# Patient Record
Sex: Female | Born: 1945 | Race: White | Hispanic: No | Smoking: Former smoker
Health system: Southern US, Community
[De-identification: ages and names within clinical notes are randomized; demographics above are authoritative.]

## PROBLEM LIST (undated history)

## (undated) DIAGNOSIS — T7840XA Allergy, unspecified, initial encounter: Secondary | ICD-10-CM

## (undated) DIAGNOSIS — S62109A Fracture of unspecified carpal bone, unspecified wrist, initial encounter for closed fracture: Secondary | ICD-10-CM

## (undated) DIAGNOSIS — C8590 Non-Hodgkin lymphoma, unspecified, unspecified site: Secondary | ICD-10-CM

## (undated) DIAGNOSIS — I10 Essential (primary) hypertension: Secondary | ICD-10-CM

## (undated) DIAGNOSIS — M810 Age-related osteoporosis without current pathological fracture: Secondary | ICD-10-CM

## (undated) DIAGNOSIS — E785 Hyperlipidemia, unspecified: Secondary | ICD-10-CM

## (undated) DIAGNOSIS — S82009A Unspecified fracture of unspecified patella, initial encounter for closed fracture: Secondary | ICD-10-CM

## (undated) DIAGNOSIS — K760 Fatty (change of) liver, not elsewhere classified: Secondary | ICD-10-CM

## (undated) DIAGNOSIS — I219 Acute myocardial infarction, unspecified: Secondary | ICD-10-CM

## (undated) DIAGNOSIS — H269 Unspecified cataract: Secondary | ICD-10-CM

## (undated) DIAGNOSIS — G4730 Sleep apnea, unspecified: Secondary | ICD-10-CM

## (undated) DIAGNOSIS — E049 Nontoxic goiter, unspecified: Secondary | ICD-10-CM

## (undated) HISTORY — DX: Hyperlipidemia, unspecified: E78.5

## (undated) HISTORY — DX: Unspecified fracture of unspecified patella, initial encounter for closed fracture: S82.009A

## (undated) HISTORY — DX: Fracture of unspecified carpal bone, unspecified wrist, initial encounter for closed fracture: S62.109A

## (undated) HISTORY — DX: Sleep apnea, unspecified: G47.30

## (undated) HISTORY — PX: COLONOSCOPY: SHX174

## (undated) HISTORY — DX: Nontoxic goiter, unspecified: E04.9

## (undated) HISTORY — PX: POLYPECTOMY: SHX149

## (undated) HISTORY — DX: Essential (primary) hypertension: I10

## (undated) HISTORY — DX: Non-Hodgkin lymphoma, unspecified, unspecified site: C85.90

## (undated) HISTORY — DX: Acute myocardial infarction, unspecified: I21.9

## (undated) HISTORY — DX: Fatty (change of) liver, not elsewhere classified: K76.0

## (undated) HISTORY — DX: Allergy, unspecified, initial encounter: T78.40XA

## (undated) HISTORY — PX: EYE SURGERY: SHX253

## (undated) HISTORY — DX: Age-related osteoporosis without current pathological fracture: M81.0

## (undated) HISTORY — DX: Unspecified cataract: H26.9

---

## 2002-06-01 HISTORY — PX: CARDIAC CATHETERIZATION: SHX172

## 2004-05-08 ENCOUNTER — Ambulatory Visit: Payer: Self-pay | Admitting: Cardiology

## 2004-05-15 ENCOUNTER — Ambulatory Visit: Payer: Self-pay | Admitting: Internal Medicine

## 2004-06-03 ENCOUNTER — Ambulatory Visit: Payer: Self-pay

## 2004-07-03 ENCOUNTER — Ambulatory Visit: Payer: Self-pay | Admitting: Internal Medicine

## 2004-07-08 ENCOUNTER — Ambulatory Visit: Payer: Self-pay | Admitting: Internal Medicine

## 2004-07-23 ENCOUNTER — Ambulatory Visit: Payer: Self-pay | Admitting: Internal Medicine

## 2004-09-09 ENCOUNTER — Ambulatory Visit: Payer: Self-pay | Admitting: Internal Medicine

## 2004-10-02 ENCOUNTER — Ambulatory Visit: Payer: Self-pay | Admitting: Internal Medicine

## 2004-10-29 ENCOUNTER — Ambulatory Visit: Payer: Self-pay | Admitting: Internal Medicine

## 2004-10-30 ENCOUNTER — Ambulatory Visit: Payer: Self-pay | Admitting: Internal Medicine

## 2004-11-19 ENCOUNTER — Ambulatory Visit: Payer: Self-pay

## 2004-12-08 ENCOUNTER — Ambulatory Visit: Payer: Self-pay | Admitting: Internal Medicine

## 2005-03-23 ENCOUNTER — Ambulatory Visit: Payer: Self-pay | Admitting: Family Medicine

## 2005-04-07 ENCOUNTER — Ambulatory Visit: Payer: Self-pay | Admitting: Internal Medicine

## 2005-04-30 ENCOUNTER — Ambulatory Visit: Payer: Self-pay | Admitting: Internal Medicine

## 2005-06-09 ENCOUNTER — Other Ambulatory Visit: Admission: RE | Admit: 2005-06-09 | Discharge: 2005-06-09 | Payer: Self-pay | Admitting: Family Medicine

## 2005-06-09 ENCOUNTER — Ambulatory Visit: Payer: Self-pay | Admitting: Family Medicine

## 2005-07-01 ENCOUNTER — Encounter: Admission: RE | Admit: 2005-07-01 | Discharge: 2005-07-01 | Payer: Self-pay | Admitting: Family Medicine

## 2006-09-28 ENCOUNTER — Ambulatory Visit: Payer: Self-pay | Admitting: Internal Medicine

## 2006-09-28 ENCOUNTER — Encounter: Admission: RE | Admit: 2006-09-28 | Discharge: 2006-09-28 | Payer: Self-pay | Admitting: Internal Medicine

## 2006-09-28 LAB — CONVERTED CEMR LAB
AST: 23 units/L (ref 0–37)
HDL: 62 mg/dL (ref >39.0)
Triglycerides: 59 mg/dL (ref 0–149)

## 2006-09-30 ENCOUNTER — Encounter: Admission: RE | Admit: 2006-09-30 | Discharge: 2006-09-30 | Payer: Self-pay | Admitting: Internal Medicine

## 2006-10-14 ENCOUNTER — Ambulatory Visit: Payer: Self-pay | Admitting: Internal Medicine

## 2007-02-10 ENCOUNTER — Ambulatory Visit: Payer: Self-pay | Admitting: Family Medicine

## 2007-02-10 DIAGNOSIS — E049 Nontoxic goiter, unspecified: Secondary | ICD-10-CM | POA: Insufficient documentation

## 2007-02-10 DIAGNOSIS — I10 Essential (primary) hypertension: Secondary | ICD-10-CM | POA: Insufficient documentation

## 2007-02-10 DIAGNOSIS — R42 Dizziness and giddiness: Secondary | ICD-10-CM | POA: Insufficient documentation

## 2007-02-10 DIAGNOSIS — E785 Hyperlipidemia, unspecified: Secondary | ICD-10-CM | POA: Insufficient documentation

## 2007-02-10 DIAGNOSIS — Z8719 Personal history of other diseases of the digestive system: Secondary | ICD-10-CM | POA: Insufficient documentation

## 2007-02-10 DIAGNOSIS — R609 Edema, unspecified: Secondary | ICD-10-CM | POA: Insufficient documentation

## 2007-02-10 DIAGNOSIS — I252 Old myocardial infarction: Secondary | ICD-10-CM | POA: Insufficient documentation

## 2007-02-24 ENCOUNTER — Ambulatory Visit: Payer: Self-pay | Admitting: Family Medicine

## 2007-04-04 ENCOUNTER — Ambulatory Visit: Payer: Self-pay | Admitting: Family Medicine

## 2007-04-05 LAB — CONVERTED CEMR LAB
Total CHOL/HDL Ratio: 3.2
VLDL: 26 mg/dL (ref 0–40)

## 2007-04-13 ENCOUNTER — Encounter: Admission: RE | Admit: 2007-04-13 | Discharge: 2007-04-13 | Payer: Self-pay | Admitting: Endocrinology

## 2007-04-26 ENCOUNTER — Encounter: Admission: RE | Admit: 2007-04-26 | Discharge: 2007-04-26 | Payer: Self-pay | Admitting: Endocrinology

## 2007-06-01 ENCOUNTER — Encounter: Admission: RE | Admit: 2007-06-01 | Discharge: 2007-06-01 | Payer: Self-pay | Admitting: Endocrinology

## 2007-08-03 ENCOUNTER — Ambulatory Visit: Payer: Self-pay | Admitting: Family Medicine

## 2007-08-04 LAB — CONVERTED CEMR LAB
AST: 32 units/L (ref 0–37)
Bilirubin, Direct: 0.1 mg/dL (ref 0.0–0.3)
Cholesterol: 138 mg/dL (ref 0–200)
LDL Cholesterol: 65 mg/dL (ref 0–99)
TSH: 1.09 microintl units/mL (ref 0.35–5.50)
Total Protein: 7 g/dL (ref 6.0–8.3)
VLDL: 15 mg/dL (ref 0–40)

## 2007-08-08 ENCOUNTER — Encounter: Payer: Self-pay | Admitting: Family Medicine

## 2007-09-27 ENCOUNTER — Encounter: Payer: Self-pay | Admitting: Internal Medicine

## 2008-01-04 ENCOUNTER — Ambulatory Visit: Payer: Self-pay | Admitting: Family Medicine

## 2008-01-04 LAB — CONVERTED CEMR LAB
ALT: 22 units/L (ref 0–35)
AST: 23 units/L (ref 0–37)
Albumin: 3.9 g/dL (ref 3.5–5.2)
Bilirubin, Direct: 0.1 mg/dL (ref 0.0–0.3)
Total CHOL/HDL Ratio: 2.8
Triglycerides: 74 mg/dL (ref 0–149)
VLDL: 15 mg/dL (ref 0–40)

## 2008-01-05 ENCOUNTER — Encounter (INDEPENDENT_AMBULATORY_CARE_PROVIDER_SITE_OTHER): Payer: Self-pay | Admitting: *Deleted

## 2008-06-09 ENCOUNTER — Encounter: Payer: Self-pay | Admitting: Family Medicine

## 2008-07-12 ENCOUNTER — Ambulatory Visit: Payer: Self-pay | Admitting: Internal Medicine

## 2009-02-08 ENCOUNTER — Ambulatory Visit: Payer: Self-pay | Admitting: Family Medicine

## 2009-02-08 DIAGNOSIS — H60339 Swimmer's ear, unspecified ear: Secondary | ICD-10-CM | POA: Insufficient documentation

## 2009-06-07 ENCOUNTER — Encounter (INDEPENDENT_AMBULATORY_CARE_PROVIDER_SITE_OTHER): Payer: Self-pay | Admitting: *Deleted

## 2009-07-22 ENCOUNTER — Ambulatory Visit: Payer: Self-pay | Admitting: Internal Medicine

## 2009-07-22 DIAGNOSIS — Z87891 Personal history of nicotine dependence: Secondary | ICD-10-CM | POA: Insufficient documentation

## 2009-08-01 ENCOUNTER — Ambulatory Visit: Payer: Self-pay | Admitting: Internal Medicine

## 2009-08-01 DIAGNOSIS — J019 Acute sinusitis, unspecified: Secondary | ICD-10-CM | POA: Insufficient documentation

## 2009-12-16 ENCOUNTER — Ambulatory Visit: Payer: Self-pay | Admitting: Family Medicine

## 2009-12-16 ENCOUNTER — Telehealth (INDEPENDENT_AMBULATORY_CARE_PROVIDER_SITE_OTHER): Payer: Self-pay | Admitting: *Deleted

## 2009-12-16 DIAGNOSIS — H10029 Other mucopurulent conjunctivitis, unspecified eye: Secondary | ICD-10-CM | POA: Insufficient documentation

## 2009-12-17 ENCOUNTER — Ambulatory Visit: Payer: Self-pay | Admitting: Family Medicine

## 2009-12-18 ENCOUNTER — Ambulatory Visit: Payer: Self-pay | Admitting: Cardiology

## 2009-12-19 ENCOUNTER — Telehealth: Payer: Self-pay | Admitting: Family Medicine

## 2010-06-01 HISTORY — PX: OTHER SURGICAL HISTORY: SHX169

## 2010-06-29 LAB — CONVERTED CEMR LAB
AST: 30 units/L (ref 0–37)
BUN: 14 mg/dL (ref 6–23)
BUN: 14 mg/dL (ref 6–23)
Basophils Absolute: 0 10*3/uL (ref 0.0–0.1)
Basophils Relative: 0.2 % (ref 0.0–1.0)
CO2: 30 meq/L (ref 19–32)
CO2: 31 meq/L (ref 19–32)
Calcium: 10.2 mg/dL (ref 8.4–10.5)
Calcium: 10.5 mg/dL (ref 8.4–10.5)
Chloride: 103 meq/L (ref 96–112)
Chloride: 107 meq/L (ref 96–112)
Cholesterol: 179 mg/dL (ref 0–200)
Creatinine, Ser: 0.6 mg/dL (ref 0.4–1.2)
Creatinine, Ser: 0.7 mg/dL (ref 0.4–1.2)
Eosinophils Absolute: 0 10*3/uL (ref 0.0–0.6)
Eosinophils Relative: 0.5 % (ref 0.0–5.0)
Folate: 19.7 ng/mL
GFR calc Af Amer: 132 mL/min
GFR calc non Af Amer: 109 mL/min
GFR calc non Af Amer: 90.01 mL/min (ref >60)
Glucose, Bld: 103 mg/dL — ABNORMAL HIGH (ref 70–99)
Glucose, Bld: 94 mg/dL (ref 70–99)
HCT: 39.9 % (ref 36.0–46.0)
HDL: 77.3 mg/dL (ref >39.00)
Hemoglobin: 13.8 g/dL (ref 12.0–15.0)
LDL Cholesterol: 86 mg/dL (ref 0–99)
Lymphocytes Relative: 28.6 % (ref 12.0–46.0)
MCHC: 34.7 g/dL (ref 30.0–36.0)
MCV: 89.3 fL (ref 78.0–100.0)
Monocytes Absolute: 0.5 10*3/uL (ref 0.2–0.7)
Monocytes Relative: 6.7 % (ref 3.0–11.0)
Neutro Abs: 4.6 10*3/uL (ref 1.4–7.7)
Neutrophils Relative %: 64 % (ref 43.0–77.0)
Platelets: 293 10*3/uL (ref 150–400)
Potassium: 4 meq/L (ref 3.5–5.1)
Potassium: 4.2 meq/L (ref 3.5–5.1)
RBC: 4.46 M/uL (ref 3.87–5.11)
RDW: 12.2 % (ref 11.5–14.6)
Sodium: 140 meq/L (ref 135–145)
Sodium: 143 meq/L (ref 135–145)
TSH: 0.02 microintl units/mL — ABNORMAL LOW (ref 0.35–5.50)
Total CHOL/HDL Ratio: 2
Triglycerides: 79 mg/dL (ref 0.0–149.0)
VLDL: 15.8 mg/dL (ref 0.0–40.0)
Vitamin B-12: 487 pg/mL (ref 211–911)
WBC: 7.2 10*3/uL (ref 4.5–10.5)

## 2010-07-01 NOTE — Assessment & Plan Note (Signed)
Summary: swelling in eye is worse//kn   Vital Signs:  Patient profile:   65 year old female Height:      66 inches Weight:      196 pounds Temp:     97.9 degrees F oral Pulse rate:   66 / minute BP sitting:   110 / 70  (left arm)  Vitals Entered By: Jeremy Johann CMA (December 17, 2009 10:05 AM) CC: increase swelling, drainage   History of Present Illness: Pt here c/o worsening d/c and swelling in L eye.  Pt is taking keflex and vigamox.    Current Medications (verified): 1)  Altace 10 Mg Caps (Ramipril) .... Once Daily 2)  Crestor 10 Mg  Tabs (Rosuvastatin Calcium) .Marland Kitchen.. 1 By Mouth At Bedtime 3)  Fosamax 70 Mg  Tabs (Alendronate Sodium) .Marland Kitchen.. 1 By Mouth Weelky 4)  Mvi .Marland Kitchen.. 1 By Mouth Once Daily 5)  Calcium Plus D 1200mg  .... 1 By Mouth Once Daily 6)  Baby Aspirin 81 Mg Chew (Aspirin) .... Once Daily 7)  Fish Oil .... 2 Tab Once Daily 8)  Keflex 500 Mg Caps (Cephalexin) .Marland Kitchen.. 1po Two Times A Day 9)  Vigamox 0.5 % Soln (Moxifloxacin Hcl) .Marland Kitchen.. 1 Gtt Three Times A Day  Allergies (verified): No Known Drug Allergies  Past History:  Past medical, surgical, family and social histories (including risk factors) reviewed for relevance to current acute and chronic problems.  Past Medical History: Reviewed history from 02/10/2007 and no changes required. Hyperlipidemia Myocardial infarction, hx of 2004 Hypertension goiter fatty liver  Past Surgical History: Reviewed history from 02/10/2007 and no changes required. Denies surgical history  Family History: Reviewed history from 02/10/2007 and no changes required. Family History of CAD Female 1st degree relative <50 Family History Hypertension Family History Lung cancer Family History Ovarian cancer MGM--DM  Social History: Reviewed history from 02/10/2007 and no changes required. Occupation:[employer] Divorced Former Smoker quit 2004 Drug use-no Regular exercise-no  Review of Systems      See HPI  Physical  Exam  General:  Well-developed,well-nourished,in no acute distress; alert,appropriate and cooperative throughout examination Eyes:  L eye--- increased d/c and tearing , sclera red + increased swelling  Psych:  Cognition and judgment appear intact. Alert and cooperative with normal attention span and concentration. No apparent delusions, illusions, hallucinations   Impression & Recommendations:  Problem # 1:  CONJUNCTIVITIS, BACTERIAL (ICD-372.03)  Her updated medication list for this problem includes:    Vigamox 0.5 % Soln (Moxifloxacin hcl) .Marland Kitchen... 1 gtt three times a day  Orders: Ophthalmology Referral (Ophthalmology) Rocephin  250mg  (E9528) Admin of Therapeutic Inj  intramuscular or subcutaneous (41324)  Discussed treatment, and urged patient to wash hands carefully after touching face.   Complete Medication List: 1)  Altace 10 Mg Caps (Ramipril) .... Once daily 2)  Crestor 10 Mg Tabs (Rosuvastatin calcium) .Marland Kitchen.. 1 by mouth at bedtime 3)  Fosamax 70 Mg Tabs (Alendronate sodium) .Marland Kitchen.. 1 by mouth weelky 4)  Mvi  .Marland Kitchen.. 1 by mouth once daily 5)  Calcium Plus D 1200mg   .... 1 by mouth once daily 6)  Baby Aspirin 81 Mg Chew (Aspirin) .... Once daily 7)  Fish Oil  .... 2 tab once daily 8)  Keflex 500 Mg Caps (Cephalexin) .Marland Kitchen.. 1po two times a day 9)  Vigamox 0.5 % Soln (Moxifloxacin hcl) .Marland Kitchen.. 1 gtt three times a day   Medication Administration  Injection # 1:    Medication: Rocephin  250mg   Diagnosis: CONJUNCTIVITIS, BACTERIAL (ICD-372.03)    Route: IM    Site: RUOQ gluteus    Exp Date: 12/31/2011    Lot #: [id number]    Mfr: novaplus    Comments: pt given 1 gram    Patient tolerated injection without complications    Given by: Jeremy Johann CMA (December 17, 2009 10:45 AM)  Orders Added: 1)  Ophthalmology Referral [Ophthalmology] 2)  Rocephin  250mg  [J0696] 3)  Admin of Therapeutic Inj  intramuscular or subcutaneous [96372] 4)  Est. Patient Level II [51761]

## 2010-07-01 NOTE — Assessment & Plan Note (Signed)
Summary: sinus infection//sore thorat//lch   Vital Signs:  Patient profile:   65 year old female Weight:      204.6 pounds Temp:     98.0 degrees F oral Pulse rate:   72 / minute Resp:     17 per minute BP sitting:   112 / 70  (left arm) Cuff size:   large  Vitals Entered By: Shonna Chock (August 01, 2009 4:14 PM) CC: Sore throat,fever,cough(productive at times) Comments REVIEWED MED LIST, PATIENT AGREED DOSE AND INSTRUCTION CORRECT    Primary Care Provider:  Laury Axon  CC:  Sore throat, fever, and cough(productive at times).  History of Present Illness: Onset 07/29/2009 as fever & chills ; temp to 101.4.Now ST X 24hrs.Rx: NSAIDS for frontal headache with benefit.Facial pain with purulence & cough with yellow sputum. 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 No PMH asthma; smoker approx 6/day. MI in 2004.           Allergies (verified): No Known Drug Allergies  Review of Systems General:  Complains of chills and fever; denies sweats. ENT:  Complains of nasal congestion and sinus pressure. Resp:  Denies chest pain with inspiration and shortness of breath; Occa wheeze.  Physical Exam  General:  in no acute distress; alert,appropriate and cooperative throughout examination Ears:  External ear exam shows no significant lesions or deformities.  Otoscopic examination reveals clear canals, tympanic membranes are intact bilaterally without bulging, retraction, inflammation or discharge. Hearing is grossly normal bilaterally. Nose:  External nasal examination shows no deformity or inflammation. Nasal mucosa are pink and moist without lesions or exudates. Hyponasal speech Mouth:  Oral mucosa and oropharynx without lesions or exudates.  Mild pharyngeal erythema.   Lungs:  Normal respiratory effort, chest expands symmetrically. Lungs are clear to auscultation, no crackles or wheezes but decreased RLL  BS. Heart:  regular rhythm and tachycardia.   P 100 Extremities:  No clubbing, cyanosis,  edema. Skin:  Intact without suspicious lesions or rashes Cervical Nodes:  No lymphadenopathy noted Axillary Nodes:  No palpable lymphadenopathy   Impression & Recommendations:  Problem # 1:  SINUSITIS- ACUTE-NOS (ICD-461.9)  Her updated medication list for this problem includes:    Amoxicillin-pot Clavulanate 875-125 Mg Tabs (Amoxicillin-pot clavulanate) .Marland Kitchen... 1 q 12 hrs with a meal  Problem # 2:  BRONCHITIS-ACUTE (ICD-466.0)  Her updated medication list for this problem includes:    Amoxicillin-pot Clavulanate 875-125 Mg Tabs (Amoxicillin-pot clavulanate) .Marland Kitchen... 1 q 12 hrs with a meal  Problem # 3:  TOBACCO ABUSE, HX OF (ICD-V15.82)  Orders: Tobacco use cessation intermediate 3-10 minutes (99406)  Complete Medication List: 1)  Altace 10 Mg Caps (Ramipril) .... Once daily 2)  Crestor 10 Mg Tabs (Rosuvastatin calcium) .Marland Kitchen.. 1 by mouth at bedtime 3)  Fosamax 70 Mg Tabs (Alendronate sodium) .Marland Kitchen.. 1 by mouth weelky 4)  Mvi  .Marland Kitchen.. 1 by mouth once daily 5)  Calcium Plus D 1200mg   .... 1 by mouth once daily 6)  Baby Aspirin 81 Mg Chew (Aspirin) .... Once daily 7)  Fish Oil  .... 2 tab once daily 8)  Amoxicillin-pot Clavulanate 875-125 Mg Tabs (Amoxicillin-pot clavulanate) .Marland Kitchen.. 1 q 12 hrs with a meal  Other Orders: Rapid Strep (16109)  Patient Instructions: 1)  Stop Smoking Tips: Choose a Quit date. Cut down before the Quit date. decide what you will do as a substitute when you feel the urge to smoke(gum,toothpick,exercise). 2)  Drink as much fluid as you can tolerate for  the next few days. 3)  Recommended remaining out of work for 03/4-10/2009 Prescriptions: AMOXICILLIN-POT CLAVULANATE 875-125 MG TABS (AMOXICILLIN-POT CLAVULANATE) 1 q 12 hrs with a meal  #20 x 0   Entered and Authorized by:   Marga Melnick MD   Signed by:   Marga Melnick MD on 08/01/2009   Method used:   Faxed to ...       [facility] [address] #[id number]* (retail)       [address]        [place], [place]  [address]       Ph: [phone]       Fax: [phone]   RxID:   [id number]   Laboratory Results    Other Tests  Rapid Strep: negative

## 2010-07-01 NOTE — Letter (Signed)
Summary: Appointment - Reminder 2  [facility], Main Office  [address]   [place], [place] [address]   Phone: [phone]  Fax: [phone]     June 07, 2009 MRN: [id number]   [facility] [employer] [address] [place], [place]  [address]   Dear Ms. Tina [name],  Our records indicate that it is time to schedule a follow-up appointment with Dr. Tenny Craw. It is very important that we reach you to schedule this appointment. We look forward to participating in your health care needs. Please contact us at the number listed above at your earliest convenience to schedule your appointment.  If you are unable to make an appointment at this time, give [place] a call so we can update our records.  Sincerely,   Migdalia Dk [facility] Scheduling Team

## 2010-07-01 NOTE — Progress Notes (Signed)
Summary: lab results  Phone Note Outgoing Call Call back at Work Phone [phone]   Call placed by: Jeremy Johann CMA,  December 19, 2009 12:02 PM Details for Reason: sinuses are clear---how is pt feeling?  Summary of Call: left message to call office................Marland KitchenFelecia Deloach CMA  December 19, 2009 12:02 PM   Follow-up for Phone Call        pt states that she is doing good and the swelling is resolving.................Marland KitchenFelecia Deloach CMA  December 19, 2009 1:41 PM

## 2010-07-01 NOTE — Progress Notes (Signed)
Summary: Triage: Eye Concerns  Phone Note Call from Patient Call back at Home Phone [phone]   Caller: Patient Summary of Call: Message left on Triage Vm: Patient with eye bloodshot red and painful, would like an appointment.   Shonna Chock CMA  December 16, 2009 9:57 AM   Follow-up for Phone Call        pt coming in today............Marland KitchenFelecia Deloach CMA  December 16, 2009 10:34 AM

## 2010-07-01 NOTE — Assessment & Plan Note (Signed)
Summary: pain redness in eye//fd   Vital Signs:  Patient profile:   65 year old female Height:      66 inches Weight:      198 pounds Temp:     98.4 degrees F oral Pulse rate:   75 / minute BP sitting:   188 / 76  (left arm)  Vitals Entered By: Jeremy Johann CMA (December 16, 2009 2:04 PM) CC: pain,swelling redness on left eye   History of Present Illness: Pt here c/o red eye.  It started with cheek pain Thursday and Friday it felt sore but eye turned red Saturday.   No congestion but since last night she has had clear sinus drainage.    Current Medications (verified): 1)  Altace 10 Mg Caps (Ramipril) .... Once Daily 2)  Crestor 10 Mg  Tabs (Rosuvastatin Calcium) .Marland Kitchen.. 1 By Mouth At Bedtime 3)  Fosamax 70 Mg  Tabs (Alendronate Sodium) .Marland Kitchen.. 1 By Mouth Weelky 4)  Mvi .Marland Kitchen.. 1 By Mouth Once Daily 5)  Calcium Plus D 1200mg  .... 1 By Mouth Once Daily 6)  Baby Aspirin 81 Mg Chew (Aspirin) .... Once Daily 7)  Fish Oil .... 2 Tab Once Daily 8)  Keflex 500 Mg Caps (Cephalexin) .Marland Kitchen.. 1po Two Times A Day 9)  Vigamox 0.5 % Soln (Moxifloxacin Hcl) .Marland Kitchen.. 1 Gtt Three Times A Day  Allergies (verified): No Known Drug Allergies  Past History:  Past medical, surgical, family and social histories (including risk factors) reviewed for relevance to current acute and chronic problems.  Past Medical History: Reviewed history from 02/10/2007 and no changes required. Hyperlipidemia Myocardial infarction, hx of 2004 Hypertension goiter fatty liver  Past Surgical History: Reviewed history from 02/10/2007 and no changes required. Denies surgical history  Family History: Reviewed history from 02/10/2007 and no changes required. Family History of CAD Female 1st degree relative <50 Family History Hypertension Family History Lung cancer Family History Ovarian cancer MGM--DM  Social History: Reviewed history from 02/10/2007 and no changes required. Occupation:[employer] Divorced Former  Smoker quit 2004 Drug use-no Regular exercise-no  Review of Systems      See HPI  Physical Exam  General:  Well-developed,well-nourished,in no acute distress; alert,appropriate and cooperative throughout examination Eyes:  L eye-- + injected, watery,  + drainage Ears:  External ear exam shows no significant lesions or deformities.  Otoscopic examination reveals clear canals, tympanic membranes are intact bilaterally without bulging, retraction, inflammation or discharge. Hearing is grossly normal bilaterally. Nose:  L maxillary sinus tenderness and R maxillary sinus tenderness.   Mouth:  Oral mucosa and oropharynx without lesions or exudates.  Teeth in good repair. Neck:  No deformities, masses, or tenderness noted. Lungs:  Normal respiratory effort, chest expands symmetrically. Lungs are clear to auscultation, no crackles or wheezes. Heart:  Normal rate and regular rhythm. S1 and S2 normal without gallop, murmur, click, rub or other extra sounds. Extremities:  No clubbing, cyanosis, edema, or deformity noted with normal full range of motion of all joints.     Impression & Recommendations:  Problem # 1:  SINUSITIS- ACUTE-NOS (ICD-461.9)  The following medications were removed from the medication list:    Amoxicillin-pot Clavulanate 875-125 Mg Tabs (Amoxicillin-pot clavulanate) .Marland Kitchen... 1 q 12 hrs with a meal Her updated medication list for this problem includes:    Keflex 500 Mg Caps (Cephalexin) .Marland Kitchen... 1po two times a day  Instructed on treatment. Call if symptoms persist or worsen.   Orders: Radiology Referral (Radiology)  Problem #  2:  CONJUNCTIVITIS, BACTERIAL (ICD-372.03) Assessment: Comment Only  Her updated medication list for this problem includes:    Vigamox 0.5 % Soln (Moxifloxacin hcl) .Marland Kitchen... 1 gtt three times a day  Discussed treatment, and urged patient to wash hands carefully after touching face.   Orders: Radiology Referral (Radiology)  Complete Medication  List: 1)  Altace 10 Mg Caps (Ramipril) .... Once daily 2)  Crestor 10 Mg Tabs (Rosuvastatin calcium) .Marland Kitchen.. 1 by mouth at bedtime 3)  Fosamax 70 Mg Tabs (Alendronate sodium) .Marland Kitchen.. 1 by mouth weelky 4)  Mvi  .Marland Kitchen.. 1 by mouth once daily 5)  Calcium Plus D 1200mg   .... 1 by mouth once daily 6)  Baby Aspirin 81 Mg Chew (Aspirin) .... Once daily 7)  Fish Oil  .... 2 tab once daily 8)  Keflex 500 Mg Caps (Cephalexin) .Marland Kitchen.. 1po two times a day 9)  Vigamox 0.5 % Soln (Moxifloxacin hcl) .Marland Kitchen.. 1 gtt three times a day Prescriptions: VIGAMOX 0.5 % SOLN (MOXIFLOXACIN HCL) 1 gtt three times a day  #7 days x 0   Entered and Authorized by:   Loreen Freud DO   Signed by:   Loreen Freud DO on 12/16/2009   Method used:   Electronically to        [facility] #[id number]* (retail)       [address]       [place], [place]  [address]       Ph: [phone]       Fax: [phone]   RxID:   [id number] KEFLEX 500 MG CAPS (CEPHALEXIN) 1po two times a day  #20 x 0   Entered and Authorized by:   Loreen Freud DO   Signed by:   Loreen Freud DO on 12/16/2009   Method used:   Electronically to        [facility] #[id number]* (retail)       [address]       [place], [place]  [address]       Ph: [phone]       Fax: [phone]   RxID:   [id number]

## 2010-07-01 NOTE — Assessment & Plan Note (Signed)
Summary: PER CHECK OUT/SF  Medications Added * FISH OIL 2 tab once daily      Allergies Added: NKDA  Visit Type:  Follow-up Primary Provider:  Laury Axon  CC:  no complaints.  History of Present Illness: Tina [name] is a 65 year old with a history of CAD (s/p NSTEMI in 2004 with PTCA/stent to LAD).  I last saw her in February 2010 Since seen, she denies chst pain.  Breathing is ok.  She has occasional palpitations.  No dizziness.  She continues to smoke 2 packs per week.  Current Medications (verified): 1)  Altace 10 Mg Caps (Ramipril) .... Once Daily 2)  Crestor 10 Mg  Tabs (Rosuvastatin Calcium) .Marland Kitchen.. 1 By Mouth At Bedtime 3)  Fosamax 70 Mg  Tabs (Alendronate Sodium) .Marland Kitchen.. 1 By Mouth Weelky 4)  Mvi .Marland Kitchen.. 1 By Mouth Once Daily 5)  Calcium Plus D 1200mg  .... 1 By Mouth Once Daily 6)  Baby Aspirin 81 Mg Chew (Aspirin) .... Once Daily 7)  Fish Oil .... 2 Tab Once Daily  Allergies (verified): No Known Drug Allergies  Past History:  Past Medical History: Last updated: 02/10/2007 Hyperlipidemia Myocardial infarction, hx of 2004 Hypertension goiter fatty liver  Social History: Last updated: 02/10/2007 Occupation:[employer] Divorced Former Smoker quit 2004 Drug use-no Regular exercise-no  Review of Systems       All systems reviwed.  Negatvie to the above problem except as noted.  Vital Signs:  Patient profile:   65 year old female Height:      66 inches Weight:      208 pounds BMI:     33.69 Pulse rate:   78 / minute BP sitting:   130 / 82 Cuff size:   large  Vitals Entered By: Burnett Kanaris, CNA (July 22, 2009 2:04 PM)  Physical Exam  Additional Exam:  Patient is in NAD HEENT:  Normocephalic, atraumatic. EOMI, PERRLA.  Neck: JVP is normal. No thyromegaly. No bruits.  Lungs: clear to auscultation. No rales no wheezes.  Heart: Regular rate and rhythm. Normal S1, S2. No S3.   No significant murmurs. PMI not displaced.  Abdomen:  Supple, nontender.  Normal bowel sounds. No masses. No hepatomegaly.  Extremities:   Good distal pulses throughout. No lower extremity edema.  Musculoskeletal :moving all extremities.  Neuro:   alert and oriented x3.    EKG  Procedure date:  07/22/2009  Findings:      NSR.  78 bpm.  Impression & Recommendations:  Problem # 1:  MYOCARDIAL INFARCTION, HX OF (ICD-412) No signs of active ischemia.  Continue current regimen. Her updated medication list for this problem includes:    Altace 10 Mg Caps (Ramipril) ..... Once daily    Baby Aspirin 81 Mg Chew (Aspirin) ..... Once daily  Problem # 2:  HYPERTENSION (ICD-401.9) Adequate control The following medications were removed from the medication list:    Hydrochlorothiazide 25 Mg Tabs (Hydrochlorothiazide) .Marland Kitchen... 1 by mouth once daily Her updated medication list for this problem includes:    Altace 10 Mg Caps (Ramipril) ..... Once daily    Baby Aspirin 81 Mg Chew (Aspirin) ..... Once daily  Problem # 3:  HYPERLIPIDEMIA (ICD-272.4) Check fasting panel today as well as AST and BMET Her updated medication list for this problem includes:    Crestor 10 Mg Tabs (Rosuvastatin calcium) .Marland Kitchen... 1 by mouth at bedtime  Orders: TLB-Lipid Panel (80061-LIPID) TLB-AST (SGOT) (84450-SGOT)  Problem # 4:  TOBACCO ABUSE, HX OF (ICD-V15.82) Counselled on  quitting.  Other Orders: EKG w/ Interpretation (93000) TLB-BMP (Basic Metabolic Panel-BMET) (80048-METABOL)  Patient Instructions: 1)  Your physician recommends that you return for lab work in: lab work today...we will call you with results 2)  Your physician wants you to follow-up in:12 months   You will receive a reminder letter in the mail two months in advance. If you don't receive a letter, please call our office to schedule the follow-up appointment.

## 2010-07-02 ENCOUNTER — Encounter: Payer: Self-pay | Admitting: Family Medicine

## 2010-07-22 ENCOUNTER — Telehealth (INDEPENDENT_AMBULATORY_CARE_PROVIDER_SITE_OTHER): Payer: Self-pay | Admitting: *Deleted

## 2010-07-22 ENCOUNTER — Other Ambulatory Visit: Payer: Self-pay | Admitting: Family Medicine

## 2010-07-22 DIAGNOSIS — Z1231 Encounter for screening mammogram for malignant neoplasm of breast: Secondary | ICD-10-CM

## 2010-07-29 ENCOUNTER — Ambulatory Visit
Admission: RE | Admit: 2010-07-29 | Discharge: 2010-07-29 | Disposition: A | Discharge status: Home / Self Care | Payer: Managed Care, Other (non HMO) | Source: Ambulatory Visit | Attending: Family Medicine | Admitting: Family Medicine

## 2010-07-29 DIAGNOSIS — Z1231 Encounter for screening mammogram for malignant neoplasm of breast: Secondary | ICD-10-CM

## 2010-07-29 NOTE — Progress Notes (Signed)
Summary: Mammogram due per [employer]  Phone Note Outgoing Call   Call placed by: Almeta Monas CMA Duncan Dull),  July 22, 2010 9:00 AM Call placed to: Patient Summary of Call: Rcv'd letter from [employer] advising patient is due for mammogram---I called patient and advised of letter and she stated she would get an appt scheduled. Initial call taken by: Almeta Monas CMA Duncan Dull),  July 22, 2010 9:01 AM

## 2010-07-29 NOTE — Letter (Signed)
Summary: [employer]-Patient is Overdue for Mammogram  [employer]-Patient is Overdue for Mammogram   Imported By: Maryln Gottron 07/25/2010 11:12:21  _____________________________________________________________________  External Attachment:    Type:   Image     Comment:   External Document

## 2010-08-08 ENCOUNTER — Encounter (INDEPENDENT_AMBULATORY_CARE_PROVIDER_SITE_OTHER): Payer: Self-pay | Admitting: *Deleted

## 2010-08-12 NOTE — Letter (Signed)
Summary: Primary Care Appointment Letter  [facility] [place]  [address] [place], [place] [address]   Phone: [phone]  Fax: [phone]    08/08/2010 MRN: [id number]     Kaelene NOBLE [address] [place], [place]  [address]    Dear Ms. Patrecia Pace,   Your Primary Care Physician Loreen Freud DO has indicated that:    __X_____it is time to schedule an appointment for a physical and fasting labs.    _______you missed your appointment on______ and need to call and          reschedule.    _______you need to have lab work done.    _______you need to schedule an appointment discuss lab or test results.    _______you need to call to reschedule your appointment that is                       scheduled on _________.     Please call our office as soon as possible. Our phone number is [phone]. Please press option 1. Our office is open 8a-5p, Monday through Friday.     Thank you,     Primary Care Scheduler

## 2010-08-14 ENCOUNTER — Encounter: Payer: Self-pay | Admitting: Internal Medicine

## 2010-08-28 ENCOUNTER — Encounter: Payer: Self-pay | Admitting: Internal Medicine

## 2010-08-28 ENCOUNTER — Ambulatory Visit (INDEPENDENT_AMBULATORY_CARE_PROVIDER_SITE_OTHER): Payer: Managed Care, Other (non HMO) | Admitting: Internal Medicine

## 2010-08-28 DIAGNOSIS — I2510 Atherosclerotic heart disease of native coronary artery without angina pectoris: Secondary | ICD-10-CM

## 2010-08-28 DIAGNOSIS — I119 Hypertensive heart disease without heart failure: Secondary | ICD-10-CM

## 2010-08-28 DIAGNOSIS — E7800 Pure hypercholesterolemia, unspecified: Secondary | ICD-10-CM

## 2010-08-28 DIAGNOSIS — I10 Essential (primary) hypertension: Secondary | ICD-10-CM

## 2010-08-28 DIAGNOSIS — E785 Hyperlipidemia, unspecified: Secondary | ICD-10-CM

## 2010-08-28 DIAGNOSIS — I252 Old myocardial infarction: Secondary | ICD-10-CM

## 2010-08-28 LAB — AST: AST: 33 U/L (ref 0–37)

## 2010-08-28 LAB — LIPID PANEL
Triglycerides: 72 mg/dL (ref 0.0–149.0)
VLDL: 14.4 mg/dL (ref 0.0–40.0)

## 2010-08-28 LAB — BASIC METABOLIC PANEL
CO2: 27 mEq/L (ref 19–32)
Calcium: 9.5 mg/dL (ref 8.4–10.5)
GFR: 78 mL/min (ref >60.00)
Potassium: 4.3 mEq/L (ref 3.5–5.1)
Sodium: 143 mEq/L (ref 135–145)

## 2010-08-28 LAB — CK: Total CK: 203 U/L — ABNORMAL HIGH (ref 7–177)

## 2010-08-28 NOTE — Progress Notes (Signed)
   Patient ID: Tina [name], female    DOB: [birth year], 65 y.o.   MRN: [id number]  HPI    Review of Systems    Physical Exam

## 2010-08-28 NOTE — Patient Instructions (Signed)
Your physician recommends that you schedule a follow-up appointment in: 12 months with Dr. Tenny Craw Your physician has recommended that you have a sleep study. This test records several body functions during sleep, including: brain activity, eye movement, oxygen and carbon dioxide blood levels, heart rate and rhythm, breathing rate and rhythm, the flow of air through your mouth and nose, snoring, body muscle movements, and chest and belly movement.

## 2010-08-28 NOTE — Progress Notes (Signed)
HPI  pateint is a 65 year old with a history of CAD (s/p NSTEMI in 2004 with PTCA/stent to LAD).  I last saw her in February 2011 Since seen she has done OK.  She denies chest pain.  No DOE.  She is tired a lot.  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  Does snore a lot Notes some muscle aches in R leg.  Not elsewhere Allergies no known allergies  Current Outpatient Prescriptions  Medication Sig Dispense Refill  . aspirin 81 MG tablet Take 81 mg by mouth daily.        . Calcium Carbonate-Vit D-Min (CALCIUM 1200 PO) 1 tab po qd       . Fish Oil OIL 2 tabs po qd       . Multiple Vitamin (MULTIVITAMIN) capsule Take 1 capsule by mouth daily.        . ramipril (ALTACE) 10 MG capsule Take 10 mg by mouth daily.        . rosuvastatin (CRESTOR) 10 MG tablet Take 10 mg by mouth daily.          Past Medical History  Diagnosis Date  . Hyperlipidemia   . Myocardial infarct hx of 2004  . HTN (hypertension)   . Goiter   . Fatty liver     No past surgical history on file.  Family History  Problem Relation Age of Onset  . Coronary artery disease    . Hypertension    . Lung cancer    . Ovarian cancer      History   Social History  . Marital Status: Single    Spouse Name: N/A    Number of Children: N/A  . Years of Education: N/A   Occupational History  . Not on file.   Social History Main Topics  . Smoking status: Former Games developer  . Smokeless tobacco: Not on file  . Alcohol Use: Not on file  . Drug Use: Not on file  . Sexually Active: Not on file   Other Topics Concern  . Not on file   Social History Narrative   [facility] [facility] Smoker quit 2004Drug use-noRegular exercise-no    Review of Systems:  All systems reviewed.  They are negative to the above problem except as previously stated.  Vital Signs: BP 110/62  Pulse 62  Wt 193 lb 1.9 oz (87.599 kg)  Physical Exam  HEENT:  Normocephalic, atraumatic. EOMI, PERRLA.  Neck: JVP is normal. No thyromegaly. No bruits.  Lungs: clear to auscultation. No rales no wheezes.  Heart: Regular rate and rhythm. Normal S1, S2. No S3.   No significant murmurs. PMI not displaced.  Abdomen:  Supple, nontender. Normal bowel sounds. No masses. No hepatomegaly.  Extremities:   Good distal pulses throughout. No lower extremity edema.  Musculoskeletal :moving all extremities.  Neuro:   alert and oriented x3.  CN II-XII grossly intact.  EKG:  NSR.  62 bpm.   Assessment and Plan:

## 2010-08-29 NOTE — Assessment & Plan Note (Signed)
Continue meds.  Good control. 

## 2010-08-29 NOTE — Assessment & Plan Note (Signed)
Doing well  No symptoms to sugg angina.  With snoring I would set up for sleep study.

## 2010-08-29 NOTE — Assessment & Plan Note (Signed)
Will check fasting lipids.  I have told her to hold crestor for 2 wks and see if it helps with achiness in R leg   I doubt.  Resume if no change.  Call if improves.

## 2010-09-08 ENCOUNTER — Telehealth: Payer: Self-pay | Admitting: *Deleted

## 2010-09-08 ENCOUNTER — Encounter: Payer: Self-pay | Admitting: *Deleted

## 2010-09-08 NOTE — Telephone Encounter (Signed)
Mailed copy of labs to the patient. Dr.Ross already called her with the results.

## 2010-09-08 NOTE — Telephone Encounter (Signed)
Message copied by Layne Benton on Mon Sep 08, 2010  7:41 PM ------      Message from: Genice Rouge      Created: Thu Aug 28, 2010  9:26 AM      Regarding: lab work       Pt would like call with lab results and also labs sent to her.

## 2010-09-21 ENCOUNTER — Emergency Department (HOSPITAL_BASED_OUTPATIENT_CLINIC_OR_DEPARTMENT_OTHER)
Admission: EM | Admit: 2010-09-21 | Discharge: 2010-09-21 | Disposition: A | Discharge status: Home / Self Care | Payer: Managed Care, Other (non HMO) | Attending: Emergency Medicine | Admitting: Emergency Medicine

## 2010-09-21 DIAGNOSIS — I10 Essential (primary) hypertension: Secondary | ICD-10-CM | POA: Insufficient documentation

## 2010-09-21 DIAGNOSIS — M810 Age-related osteoporosis without current pathological fracture: Secondary | ICD-10-CM | POA: Insufficient documentation

## 2010-09-21 DIAGNOSIS — I2510 Atherosclerotic heart disease of native coronary artery without angina pectoris: Secondary | ICD-10-CM | POA: Insufficient documentation

## 2010-09-21 DIAGNOSIS — H60399 Other infective otitis externa, unspecified ear: Secondary | ICD-10-CM | POA: Insufficient documentation

## 2010-09-21 DIAGNOSIS — H9209 Otalgia, unspecified ear: Secondary | ICD-10-CM | POA: Insufficient documentation

## 2010-09-21 DIAGNOSIS — Z79899 Other long term (current) drug therapy: Secondary | ICD-10-CM | POA: Insufficient documentation

## 2010-09-30 ENCOUNTER — Ambulatory Visit (INDEPENDENT_AMBULATORY_CARE_PROVIDER_SITE_OTHER): Payer: Managed Care, Other (non HMO) | Admitting: Family Medicine

## 2010-09-30 ENCOUNTER — Ambulatory Visit (HOSPITAL_BASED_OUTPATIENT_CLINIC_OR_DEPARTMENT_OTHER): Payer: Managed Care, Other (non HMO) | Attending: Internal Medicine

## 2010-09-30 ENCOUNTER — Encounter: Payer: Self-pay | Admitting: Family Medicine

## 2010-09-30 VITALS — BP 116/74 | HR 60 | Temp 98.5°F

## 2010-09-30 DIAGNOSIS — H6090 Unspecified otitis externa, unspecified ear: Secondary | ICD-10-CM

## 2010-09-30 DIAGNOSIS — H60399 Other infective otitis externa, unspecified ear: Secondary | ICD-10-CM

## 2010-09-30 DIAGNOSIS — G4733 Obstructive sleep apnea (adult) (pediatric): Secondary | ICD-10-CM | POA: Insufficient documentation

## 2010-09-30 MED ORDER — OFLOXACIN 0.3 % OT SOLN: 10.0000 [drp] | Freq: Two times a day (BID) | OTIC | Status: DC

## 2010-09-30 MED ORDER — CEFUROXIME AXETIL 500 MG PO TABS: 500.0000 mg | ORAL_TABLET | Freq: Two times a day (BID) | ORAL | Status: AC

## 2010-09-30 NOTE — Assessment & Plan Note (Signed)
ceftin for 10 days floxin drops To ENT if no better by end of week

## 2010-09-30 NOTE — Progress Notes (Signed)
  Subjective:    Patient ID: Tina [name], female    DOB: 07/14/1945, 65 y.o.   MRN: [id number]  HPI Pt here to f/u ER from 1 week ago for OE.  Pt was given drops by ER but it has not completely resolved. Pt c/o L ear Pain only.  No other symptoms.    Review of Systems As above    Objective:   Physical Exam  Constitutional: She appears well-developed and well-nourished.  HENT:  Left Ear: Hearing normal. No lacerations. There is swelling and tenderness. No foreign bodies. No mastoid tenderness. Tympanic membrane is not injected, not scarred, not perforated, not erythematous, not retracted and not bulging.  Mouth/Throat: Oropharynx is clear and moist. No oropharyngeal exudate.          Assessment & Plan:

## 2010-09-30 NOTE — Patient Instructions (Signed)
Swimmer's Ear (Otitis Externa) Otitis externa ("swimmer's ear") is a germ (bacterial) or fungal infection of the outer ear canal (from the eardrum to the outside of the ear). Swimming in dirty water may cause swimmer's ear. It also may be caused by moisture in the ear from water remaining after swimming or bathing. Often the first signs of infection may be itching in the ear canal. This may progress to ear canal swelling, redness, and pus drainage which may be signs of infection. HOME CARE INSTRUCTIONS  Apply the antibiotic drops to the ear canal as prescribed by your doctor.   This can be a very painful medical condition. A strong pain reliever may be prescribed.   Only take over-the-counter or prescription medicines for pain, discomfort, or fever as directed by your caregiver.   If your caregiver has given you a follow-up appointment, it is very important to keep that appointment. Not keeping the appointment could result in a chronic or permanent injury, pain, hearing loss and disability. If there is any problem keeping the appointment, you must call back to this facility for assistance.  PREVENTION  It is important to keep your ear dry. Use the corner of a towel to wick water out of the ear canal after swimming or bathing.   Avoid scratching in your ear. This can damage the ear canal or remove the protective wax lining the canal and make it easier for germs (bacteria) or a fungus to grow.   You may use ear drops made of rubbing alcohol and vinegar after swimming to prevent future "swimmer ear" infections. Make up a small bottle of equal parts white vinegar and alcohol. Put 3 or 4 drops into each ear after swimming.   Avoid swimming in lakes, polluted water, or poorly chlorinated pools.  SEEK MEDICAL CARE IF:  An oral temperature above 100.4 develops.   Your ear is still painful after 3 days and shows signs of getting worse (redness, swelling, pain, or pus).  MAKE SURE YOU:   Understand  these instructions.   Will watch your condition.   Will get help right away if you are not doing well or get worse.  Document Released: 05/18/2005 Document Re-Released: 04/30/2008 ExitCare Patient Information 2011 [employer]. 

## 2010-10-03 DIAGNOSIS — G4733 Obstructive sleep apnea (adult) (pediatric): Secondary | ICD-10-CM

## 2010-10-03 NOTE — Procedures (Addendum)
NAME:  Tina [name], Tina [name] NO.:  [id number]  MEDICAL RECORD NO.:  [id number]          PATIENT TYPE:  OUT  LOCATION:  SLEEP CENTER                 FACILITY:  [facility]  PHYSICIAN:  Barbaraann Share, MD,FCCPDATE OF BIRTH:  Apr 17, 1947  DATE OF STUDY:  09/30/2010                           NOCTURNAL POLYSOMNOGRAM  REFERRING PHYSICIAN:  PAULA V ROSS  INDICATION FOR STUDY:  Hypersomnia with sleep apnea.  EPWORTH SLEEPINESS SCORE:  4.  MEDICATIONS:  SLEEP ARCHITECTURE:  The patient had a total sleep time of 343 minutes with no slow wave sleep and only 83 minutes of REM.  Sleep onset latency was borderline prolonged at 31 minutes, and REM onset was normal at 98 minutes.  Sleep efficiency was mildly reduced at 81%.  RESPIRATORY DATA:  The patient was found to have 49 obstructive apneas and 45 obstructive hypopneas, giving her an apnea/hypopnea index of 17 events per hour.  The events occurred in all body positions, but were clearly more prominent during REM.  There was moderate-to-loud snoring noted throughout.  OXYGEN DATA:  There was O2 desaturation as low as 86% with the patient's obstructive events.  CARDIAC DATA:  The patient was noted to have an occasional PAC and PVC, but no clinically significant arrhythmias were noted.  MOVEMENT-PARASOMNIA:  The patient had no significant leg jerks or other abnormal behavior seen.  IMPRESSIONS-RECOMMENDATIONS: 1. Mild obstructive sleep apnea/hypopnea syndrome with an     apnea/hypopnea index of 17 events per hour and oxygen desaturation     as low as 86%.  Treatment for this degree of sleep apnea can     include a trial of weight loss alone, upper airway surgery, dental     appliance, and also continuous positive airway pressure.  The     decision to treat this degree of sleep apnea should be based upon     its impact to the patient's quality of life, and whether it may be     aggravating comorbid medical problems.  Clinical  correlation is     suggested. 2. Occasional premature atrial contractions and premature ventricular     contractions noted, but no clinically significant arrhythmia seen.     Barbaraann Share, MD,FCCP Diplomate, American Board of Sleep Medicine Electronically Signed    [facility]/MEDQ  D:  10/03/2010 07:27:35  T:  10/03/2010 07:37:55  Job:  [id number]

## 2010-10-11 ENCOUNTER — Encounter: Payer: Self-pay | Admitting: Family Medicine

## 2010-10-14 ENCOUNTER — Ambulatory Visit (INDEPENDENT_AMBULATORY_CARE_PROVIDER_SITE_OTHER): Payer: Managed Care, Other (non HMO) | Admitting: Family Medicine

## 2010-10-14 ENCOUNTER — Encounter: Payer: Self-pay | Admitting: Family Medicine

## 2010-10-14 VITALS — BP 116/70 | HR 74 | Temp 97.4°F | Ht 64.5 in | Wt 206.4 lb

## 2010-10-14 DIAGNOSIS — Z0000 Encounter for general adult medical examination without abnormal findings: Secondary | ICD-10-CM

## 2010-10-14 DIAGNOSIS — R319 Hematuria, unspecified: Secondary | ICD-10-CM

## 2010-10-14 DIAGNOSIS — I252 Old myocardial infarction: Secondary | ICD-10-CM

## 2010-10-14 DIAGNOSIS — Z23 Encounter for immunization: Secondary | ICD-10-CM

## 2010-10-14 DIAGNOSIS — M949 Disorder of cartilage, unspecified: Secondary | ICD-10-CM

## 2010-10-14 DIAGNOSIS — M899 Disorder of bone, unspecified: Secondary | ICD-10-CM

## 2010-10-14 DIAGNOSIS — M8580 Other specified disorders of bone density and structure, unspecified site: Secondary | ICD-10-CM

## 2010-10-14 DIAGNOSIS — I10 Essential (primary) hypertension: Secondary | ICD-10-CM

## 2010-10-14 DIAGNOSIS — E785 Hyperlipidemia, unspecified: Secondary | ICD-10-CM

## 2010-10-14 LAB — POCT URINALYSIS DIPSTICK
Bilirubin, UA: NEGATIVE
Glucose, UA: NEGATIVE
Ketones, UA: NEGATIVE
Spec Grav, UA: 1.02
Urobilinogen, UA: 0.2

## 2010-10-14 MED ORDER — ALENDRONATE SODIUM 70 MG PO TABS: 70.0000 mg | ORAL_TABLET | ORAL | Status: DC

## 2010-10-14 MED ORDER — TETANUS-DIPHTH-ACELL PERTUSSIS 5-2.5-18.5 LF-MCG/0.5 IM SUSP
0.5000 mL | Freq: Once | INTRAMUSCULAR | Status: AC
  Administered 2010-10-14: 0.5 mL via INTRAMUSCULAR

## 2010-10-14 MED ORDER — RAMIPRIL 10 MG PO CAPS: 10.0000 mg | ORAL_CAPSULE | Freq: Every day | ORAL | Status: DC

## 2010-10-14 MED ORDER — ZOSTER VACCINE LIVE 19400 UNT/0.65ML ~~LOC~~ SOLR
0.6500 mL | Freq: Once | SUBCUTANEOUS | Status: AC
  Administered 2010-10-14: 19400 [IU] via SUBCUTANEOUS

## 2010-10-14 NOTE — Assessment & Plan Note (Signed)
[name]                            CARDIOLOGY OFFICE NOTE   NAME:Tina [name]                        MRN:          [id number]  DATE:07/12/2008                            DOB:          Aug 04, 1945    IDENTIFICATION:  Ms. Tina [name] is a 65 year old woman when I saw her back in  May 2008.  She has a history of CAD (NSTEMI) in 2004 with PTCA/stent to  the LAD.   Since seen, she was actually admitted to [name] few days  ago, she was at work (she works the night shift as a Engineer, civil (consulting)).  She  noticed her heart rate speeding quickly increasing and she felt a  pounding in her neck.  She said it was about 1:40.  She did not feel  well after 7 minutes, but denied dizziness.  No chest pain, went to the  emergency room.  From there she was again noted to be in sinus  tachycardia.  She was admitted, ruled out for a myocardial infarction.  Echocardiogram showed normal LV function.  Of note, a D-dimer was  elevated at 625, but a chest CT was negative for pulmonary embolus.  She  was discharged home.   Since discharge, she has not had any further spells.   She denies chest pain, neck pain, arm pain.  She is active during the  day.  She is not exercising like she used to and of note, she is back to  smoking about a pack per week.   CURRENT MEDICINES:  1. Aspirin 325.  2. Multivitamin.  3. Calcium.  4. Crestor 10.  5. Fosamax 70.  6. Altace 10.   PHYSICAL EXAMINATION:  GENERAL:  On exam, the patient is in no distress  at rest.  VITAL SIGNS:  Blood pressure 134/80, pulse 70 and regular, weight 209  which is up from 190 in last visit.  NECK:  No bruits.  LUNGS:  Clear to auscultation.  CARDIAC:  Regular rate and rhythm.  S1, S2.  No S3.  No murmurs.  ABDOMEN:  Benign, obese.  EXTREMITIES:  Good distal pulses.  No edema.   A 12-lead EKG shows normal sinus rhythm 70 beats per minute.   IMPRESSION:  1. Coronary artery disease, seems to be  clinically stable.  I am not      convinced this spell she had was related to ischemic incident.  I      would continue on the same regimen.  2. Palpitations.  Again, we will follow if she has a recurrence.  We      will plan further workup.  3. Dyslipidemia.  We will need to get fasting lipids.  She said she      thought it was done at [name], if not she will have them      repeated.  4. Health care maintenance.  Encouraged her to stay active, watch her      diet, and quit smoking.  We spent about 15 minutes talking about      this.   The patient will be set  to be seen in 1 year's time, sooner if problems  develop.  I will be in touch with her regarding the lab work.  I do  think she needs to be seen by [name] just for followup continued  care.  I have refilled her prescriptions today.     Tina Riffle, MD, [name]  Electronically Signed    PVR/MedQ  DD: 07/12/2008  DT: 07/12/2008  Job #: [id number]   cc:   Tina Perla, DO

## 2010-10-14 NOTE — Assessment & Plan Note (Signed)
[facility]                            CARDIOLOGY OFFICE NOTE   NAME:Tina [name]                          MRN:          [id number]  DATE:10/14/2006                            DOB:          12/17/1945    IDENTIFICATION:  Tina [name] is a 65 year old woman, history of CAD (non-Q-  wave MI in 2004 with PTCA stent to the LAD).  I saw her back in clinic  actually in 2006.   In the interval, she has done okay.  She notes an occasional twinge in  her breasts plus-minus activity, no chest pain otherwise, no shortness  of breath.  She was walking; not so much now.   CURRENT MEDICATIONS:  1. Aspirin 325.  2. A multivitamin.  3. Calcium 1.2.  4. Crestor 10.  5. Fosamax.   PHYSICAL EXAMINATION:  GENERAL:  The patient is in no distress.  VITAL SIGNS:  Blood pressure 134/81, pulse of 76, weight 190, up 3  pounds from 2006.  LUNGS:  Clear.  CARDIAC:  Regular rate and rhythm, S1 and S2, no S3.  ABDOMEN:  Benign.  EXTREMITIES:  No edema.   IMPRESSION:  1. Coronary artery disease, clinically stable, breast pain is      atypical.  Will continue.  2. Dyslipidemia.  Will checking fasting lipids in October.   NOTE:  Her labs now:  Her LDL is 90, it had been 80.  I think she can do  better, and she says her diet has not been the best, HDL is great at 62,  total cholesterol 164.   Otherwise, I will follow up in one year's time, sooner if problems  develop.     Pricilla Riffle, MD, [facility]     PVR/MedQ  DD: 10/14/2006  DT: 10/14/2006  Job #: [id number]

## 2010-10-14 NOTE — Assessment & Plan Note (Signed)
Refill meds stable 

## 2010-10-14 NOTE — Assessment & Plan Note (Signed)
con't meds Per cardio 

## 2010-10-14 NOTE — Patient Instructions (Signed)
Diet and exercise--- 3x a week for 30 minutes

## 2010-10-14 NOTE — Assessment & Plan Note (Addendum)
On crestor Cardiology follows Reviewed labs from cardio

## 2010-10-14 NOTE — Progress Notes (Signed)
   Subjective:     Tina [name] is a 65 y.o. female and is here for a comprehensive physical exam. The patient reports no problems.  History   Social History  . Marital Status: Divorced    Spouse Name: N/A    Number of Children: N/A  . Years of Education: N/A   Occupational History  . Not on file.   Social History Main Topics  . Smoking status: Former Smoker -- 1.0 packs/day for 30 years    Quit date: 07/16/2010  . Smokeless tobacco: Former Neurosurgeon    Quit date: 06/01/2002  . Alcohol Use: Not on file  . Drug Use: Not on file  . Sexually Active: No   Other Topics Concern  . Not on file   Social History Narrative   [place]up[place] [facility]cedFormer Smoker quit 2004Drug use-noRegular exercise-no   Health Maintenance  Topic Date Due  . Tetanus/tdap  03/02/1966  . Colonoscopy  03/02/1997  . Influenza Vaccine  03/02/2011  . Mammogram  07/29/2012  . Pap Smear  10/13/2012  . Zostavax  Completed    The following portions of the patient's history were reviewed and updated as appropriate: allergies, current medications, past family history, past medical history, past social history, past surgical history and problem list.  Review of Systems Review of Systems  Constitutional: Negative for activity change, appetite change and fatigue.  HENT: Negative for hearing loss, congestion, tinnitus and ear discharge.  dentist due Eyes: Negative for visual disturbance (see optho q1y -- vision corrected to 20/20 with glasses).  Respiratory: Negative for cough, chest tightness and shortness of breath.   Cardiovascular: Negative for chest pain, palpitations and leg swelling.  Gastrointestinal: Negative for abdominal pain, diarrhea, constipation and abdominal distention.  Genitourinary: Negative for urgency, frequency, decreased urine volume and difficulty urinating.  Musculoskeletal: Negative for back pain, arthralgias and gait problem.  Skin: Negative for color change, pallor and  rash.  Neurological: Negative for dizziness, light-headedness, numbness and headaches.  Hematological: Negative for adenopathy. Does not bruise/bleed easily.  Psychiatric/Behavioral: Negative for suicidal ideas, confusion, sleep disturbance, self-injury, dysphoric mood, decreased concentration and agitation.       Objective:    BP 116/70  Pulse 74  Temp(Src) 97.4 F (36.3 C) (Oral)  Ht 5' 4.5" (1.638 m)  Wt 206 lb 6.4 oz (93.622 kg)  BMI 34.88 kg/m2  SpO2 98% General appearance: alert, cooperative, appears stated age and no distress Head: Normocephalic, without obvious abnormality, atraumatic Eyes: conjunctivae/corneas clear. PERRL, EOM's intact. Fundi benign. Ears: normal TM's and external ear canals both ears Nose: Nares normal. Septum midline. Mucosa normal. No drainage or sinus tenderness. Throat: lips, mucosa, and tongue normal; teeth and gums normal Neck: no adenopathy, no carotid bruit, no JVD, supple, symmetrical, trachea midline and thyroid not enlarged, symmetric, no tenderness/mass/nodules Lungs: clear to auscultation bilaterally Breasts: normal appearance, no masses or tenderness Heart: regular rate and rhythm, S1, S2 normal, no murmur, click, rub or gallop Abdomen: soft, non-tender; bowel sounds normal; no masses,  no organomegaly Extremities: extremities normal, atraumatic, no cyanosis or edema Pulses: 2+ and symmetric Skin: Skin color, texture, turgor normal. No rashes or lesions Lymph nodes: Cervical, supraclavicular, and axillary nodes normal. Neurologic: Grossly normal    Assessment:    Healthy female exam.    Plan:     See After Visit Summary for Counseling Recommendations

## 2010-10-15 LAB — CBC WITH DIFFERENTIAL/PLATELET
Basophils Absolute: 0 10*3/uL (ref 0.0–0.1)
Basophils Relative: 0.3 % (ref 0.0–3.0)
Eosinophils Relative: 1.4 % (ref 0.0–5.0)
Hemoglobin: 13.1 g/dL (ref 12.0–15.0)
Lymphs Abs: 2.2 10*3/uL (ref 0.7–4.0)
MCHC: 33.9 g/dL (ref 30.0–36.0)

## 2010-10-15 LAB — CK: Total CK: 94 U/L (ref 7–177)

## 2010-10-15 LAB — BASIC METABOLIC PANEL
Calcium: 9.7 mg/dL (ref 8.4–10.5)
Sodium: 141 mEq/L (ref 135–145)

## 2010-10-16 LAB — URINE CULTURE

## 2010-10-23 ENCOUNTER — Telehealth: Payer: Self-pay | Admitting: Internal Medicine

## 2010-10-23 ENCOUNTER — Ambulatory Visit
Admission: RE | Admit: 2010-10-23 | Discharge: 2010-10-23 | Disposition: A | Discharge status: Home / Self Care | Payer: Managed Care, Other (non HMO) | Source: Ambulatory Visit | Attending: Family Medicine | Admitting: Family Medicine

## 2010-10-23 DIAGNOSIS — M8580 Other specified disorders of bone density and structure, unspecified site: Secondary | ICD-10-CM

## 2010-10-23 NOTE — Telephone Encounter (Signed)
Called patient with sleep study results and mailed copy of the report to her home address.

## 2010-10-23 NOTE — Telephone Encounter (Signed)
Pt calling for sleep study results  

## 2010-10-30 ENCOUNTER — Other Ambulatory Visit (INDEPENDENT_AMBULATORY_CARE_PROVIDER_SITE_OTHER): Payer: Managed Care, Other (non HMO)

## 2010-10-30 ENCOUNTER — Other Ambulatory Visit: Payer: Self-pay | Admitting: Family Medicine

## 2010-10-30 DIAGNOSIS — R319 Hematuria, unspecified: Secondary | ICD-10-CM

## 2010-10-30 LAB — POCT URINALYSIS DIPSTICK
Bilirubin, UA: NEGATIVE
Glucose, UA: NEGATIVE
Ketones, UA: NEGATIVE
Nitrite, UA: NEGATIVE
Spec Grav, UA: 1.02

## 2010-11-01 LAB — URINE CULTURE: Organism ID, Bacteria: NO GROWTH

## 2010-11-04 NOTE — Progress Notes (Signed)
Was urine culture done?

## 2010-11-05 ENCOUNTER — Encounter: Payer: Self-pay | Admitting: *Deleted

## 2010-11-10 ENCOUNTER — Encounter: Payer: Self-pay | Admitting: Family Medicine

## 2010-12-11 ENCOUNTER — Other Ambulatory Visit: Payer: Managed Care, Other (non HMO) | Admitting: Internal Medicine

## 2011-03-27 ENCOUNTER — Encounter: Payer: Self-pay | Admitting: Family Medicine

## 2011-03-27 ENCOUNTER — Ambulatory Visit (INDEPENDENT_AMBULATORY_CARE_PROVIDER_SITE_OTHER): Payer: Managed Care, Other (non HMO) | Admitting: Family Medicine

## 2011-03-27 VITALS — BP 132/92 | HR 67 | Temp 98.2°F | Wt 201.4 lb

## 2011-03-27 DIAGNOSIS — L738 Other specified follicular disorders: Secondary | ICD-10-CM

## 2011-03-27 DIAGNOSIS — L739 Follicular disorder, unspecified: Secondary | ICD-10-CM

## 2011-03-27 MED ORDER — SULFAMETHOXAZOLE-TMP DS 800-160 MG PO TABS: 1.0000 | ORAL_TABLET | Freq: Two times a day (BID) | ORAL | Status: AC

## 2011-03-27 NOTE — Patient Instructions (Signed)
Folliculitis      Folliculitis is an infection and inflammation of the hair follicles. Hair follicles become red and irritated. This inflammation is usually caused by bacteria. The bacteria thrive in warm, moist environments. This condition can be seen anywhere on the body.   CAUSES  The most common cause of folliculitis is an infection by germs (bacteria). Fungal and viral infections can also cause the condition. Viral infections may be more common in people whose bodies are unable to fight disease well (weakened immune systems). Examples include people with:  · AIDS.   · An organ transplant.   · Cancer.   People with depressed immune systems, diabetes, or obesity, have a greater risk of getting folliculitis than the general population. Certain chemicals, especially oils and tars, also can cause folliculitis.  SYMPTOMS  · An early sign of folliculitis is a small, white or yellow pus-filled, itchy lesion (pustule). These lesions appear on a red, inflamed follicle. They are usually less than 5 mm (.20 inches).   · The most likely starting points are the scalp, thighs, legs, back and buttocks. Folliculitis is also frequently found in areas of repeated shaving.   · When an infection of the follicle goes deeper, it becomes a boil or furuncle. A group of closely packed boils create a larger lesion (a carbuncle). These sores (lesions) tend to occur in hairy, sweaty areas of the body.   TREATMENT   · A doctor who specializes in skin problems (dermatologists) treats mild cases of folliculitis with antiseptic washes.   · They also use a skin application which kills germs (topical antibiotics). Tea tree oil is a good topical antiseptic as well. It can be found at a health food store. A small percentage of individuals may develop an allergy to the tea tree oil.   · Mild to moderate boils respond well to warm water compresses applied three times daily.   · In some cases, oral antibiotics should be taken with the skin treatment.    · If lesions contain large quantities of pus or fluid, your caregiver may drain them. This allows the topical antibiotics to get to the affected areas better.   · Stubborn cases of folliculitis may respond to laser hair removal. This process uses a high intensity light beam (a laser) to destroy the follicle and reduces the scarring from folliculitis. After laser hair removal, hair will no longer grow in the laser treated area.   Patients with long-lasting folliculitis need to find out where the infection is coming from. Germs can live in the nostrils of the patient. This can trigger an outbreak now and then. Sometimes the bacteria live in the nostrils of a family member. This person does not develop the disorder but they repeatedly re-expose others to the germ. To break the cycle of recurrence in the patient, the family member must also undergo treatment.  PREVENTION   · Individuals who are predisposed to folliculitis should be extremely careful about personal hygiene.   · Application of antiseptic washes may help prevent recurrences.   · A topical antibiotic cream, mupirocin (Bactroban®), has been effective at reducing bacteria in the nostrils. It is applied inside the nose with your little finger. This is done twice daily for a week. Then it is repeated every 6 months.   · Because follicle disorders tend to come back, patients must receive follow-up care. Your caregiver may be able to recognize a recurrence before it becomes severe.   SEEK IMMEDIATE MEDICAL CARE   IF:   · You develop redness, swelling, or increasing pain in the area.   · You have a fever.   · You are not improving with treatment or are getting worse.   · You have any other questions or concerns.   Document Released: 07/27/2001 Document Revised: 01/28/2011 Document Reviewed: 05/23/2008  ExitCare® Patient Information ©2012 [employer].

## 2011-03-27 NOTE — Progress Notes (Signed)
  Subjective:    Patient ID: [name], female    DOB: Sep 23, 1945, 65 y.o.   MRN: [id number]  HPI Pt is here c/o rash under L arm and several days.  She has been using heat with little relief.   No other complaints.   Review of Systems As above    Objective:   Physical Exam  Constitutional: She is oriented to person, place, and time. She appears well-developed and well-nourished.  Neurological: She is alert and oriented to person, place, and time.  Skin:       + papules L axilla --no drainage  Psychiatric: She has a normal mood and affect. Her behavior is normal. Judgment and thought content normal.          Assessment & Plan:  Folliculitis---bactrim ds 1 po bid                      Warm compresses

## 2011-06-03 ENCOUNTER — Other Ambulatory Visit: Payer: Self-pay | Admitting: Internal Medicine

## 2011-07-17 ENCOUNTER — Other Ambulatory Visit: Payer: Self-pay | Admitting: Family Medicine

## 2011-07-17 DIAGNOSIS — Z1231 Encounter for screening mammogram for malignant neoplasm of breast: Secondary | ICD-10-CM

## 2011-08-04 ENCOUNTER — Ambulatory Visit
Admission: RE | Admit: 2011-08-04 | Discharge: 2011-08-04 | Disposition: A | Discharge status: Home / Self Care | Payer: Managed Care, Other (non HMO) | Source: Ambulatory Visit | Attending: Family Medicine | Admitting: Family Medicine

## 2011-08-04 DIAGNOSIS — Z1231 Encounter for screening mammogram for malignant neoplasm of breast: Secondary | ICD-10-CM

## 2011-08-17 ENCOUNTER — Ambulatory Visit: Payer: Managed Care, Other (non HMO) | Admitting: Internal Medicine

## 2011-08-21 ENCOUNTER — Ambulatory Visit (INDEPENDENT_AMBULATORY_CARE_PROVIDER_SITE_OTHER): Payer: Managed Care, Other (non HMO) | Admitting: Internal Medicine

## 2011-08-21 VITALS — BP 145/75 | HR 56 | Ht 65.0 in | Wt 187.0 lb

## 2011-08-21 DIAGNOSIS — I252 Old myocardial infarction: Secondary | ICD-10-CM

## 2011-08-21 DIAGNOSIS — E785 Hyperlipidemia, unspecified: Secondary | ICD-10-CM

## 2011-08-21 DIAGNOSIS — I10 Essential (primary) hypertension: Secondary | ICD-10-CM

## 2011-08-21 LAB — BASIC METABOLIC PANEL
BUN: 13 mg/dL (ref 6–23)
CO2: 29 mEq/L (ref 19–32)
Chloride: 101 mEq/L (ref 96–112)
Glucose, Bld: 99 mg/dL (ref 70–99)
Potassium: 4.3 mEq/L (ref 3.5–5.1)

## 2011-08-21 LAB — LIPID PANEL
Cholesterol: 198 mg/dL (ref 0–200)
Total CHOL/HDL Ratio: 3
Triglycerides: 89 mg/dL (ref 0.0–149.0)

## 2011-08-21 LAB — CK: Total CK: 78 U/L (ref 7–177)

## 2011-08-21 NOTE — Assessment & Plan Note (Signed)
Doing well.  No symptoms of angina.

## 2011-08-21 NOTE — Assessment & Plan Note (Signed)
Will check labs.   Told her to try CoQ 10 Will check on dose of Vit D

## 2011-08-21 NOTE — Progress Notes (Signed)
HPI pateint is a 66 year old with a history of CAD (s/p NSTEMI in 2004 with PTCA/stent to LAD). I last saw her in March 2012. Since seen had sleep study that showed mild sleep apnea. Started weight watchers in the fall  Down 25# Occasionally feels puffy in face.  Watches Na Occasional SOB.  No CP.  Working out on treadmill 3x per wk.   Still achy on meds.  Would like to come off statin if able.  No Known Allergies  Current Outpatient Prescriptions  Medication Sig Dispense Refill  . alendronate (FOSAMAX) 70 MG tablet Take 1 tablet (70 mg total) by mouth every 7 (seven) days. Take with a full glass of water on an empty stomach.  4 tablet  11  . aspirin 81 MG tablet Take 81 mg by mouth daily.        . Biotin 10 MG TABS Take by mouth. For hair griowth      . Calcium Carbonate-Vit D-Min (CALCIUM 1200 PO) 1 tab po qd       . CRESTOR 20 MG tablet TAKE 1/2 TABLET BY MOUTH EVERY NIGHT AT BEDTIME  45 tablet  5  . Fish Oil OIL 2 tabs po qd       . Multiple Vitamin (MULTIVITAMIN) capsule Take 1 capsule by mouth daily.        . ramipril (ALTACE) 10 MG capsule Take 1 capsule (10 mg total) by mouth daily.  90 capsule  3  . rosuvastatin (CRESTOR) 10 MG tablet Take 10 mg by mouth daily.          Past Medical History  Diagnosis Date  . Hyperlipidemia   . Myocardial infarct hx of 2004  . HTN (hypertension)   . Goiter   . Fatty liver     Past Surgical History  Procedure Date  . Cardiac catheterization 2004    stent    Family History  Problem Relation Age of Onset  . Coronary artery disease    . Lung cancer    . Ovarian cancer    . Diabetes Maternal Grandmother   . Hypertension Mother   . Heart disease Mother 55    MI--- died from #3 at 58  . Hypertension Father   . Cancer Father 70    ovarian  . Hypertension Sister   . Hypertension Brother   . Cancer Sister 50    brain tumor    History   Social History  . Marital Status: Divorced    Spouse Name: N/A    Number of Children: N/A   . Years of Education: N/A   Occupational History  . Not on file.   Social History Main Topics  . Smoking status: Former Smoker -- 1.0 packs/day for 30 years    Quit date: 07/16/2010  . Smokeless tobacco: Former Neurosurgeon    Quit date: 06/01/2002  . Alcohol Use: Not on file  . Drug Use: Not on file  . Sexually Active: No   Other Topics Concern  . Not on file   Social History Narrative   [place]up[place] [facility]orcedFormer Smoker quit 2004Drug use-noRegular exercise-no    Review of Systems:  All systems reviewed.  They are negative to the above problem except as previously stated.  Vital Signs: BP 145/75  Pulse 56  Ht 5\' 5"  (1.651 m)  Wt 187 lb (84.823 kg)  BMI 31.12 kg/m2  Physical Exam Patinet is in NAD.  Did not take meds today. HEENT:  Normocephalic, atraumatic.  EOMI, PERRLA.  Neck: JVP is normal. No thyromegaly. No bruits.  Lungs: clear to auscultation. No rales no wheezes.  Heart: Regular rate and rhythm. Normal S1, S2. No S3.   No significant murmurs. PMI not displaced.  Abdomen:  Supple, nontender. Normal bowel sounds. No masses. No hepatomegaly.  Extremities:   Good distal pulses throughout. No lower extremity edema.  Musculoskeletal :moving all extremities.  Neuro:   alert and oriented x3.  CN II-XII grossly intact.  SB  56  bpm.  Assessment and Plan:

## 2011-08-21 NOTE — Assessment & Plan Note (Signed)
BP is fair.  Did not take meds this AM.  Will check BMET  No change

## 2011-08-21 NOTE — Patient Instructions (Signed)
Lab work today We will call you with results.  Your physician wants you to follow-up in: 12 months You will receive a reminder letter in the mail two months in advance. If you don't receive a letter, please call our office to schedule the follow-up appointment.  

## 2011-08-27 ENCOUNTER — Telehealth: Payer: Self-pay | Admitting: *Deleted

## 2011-08-27 NOTE — Telephone Encounter (Signed)
Called patient with lab results. Advised her per Dr.Ross to hold Crestor for 2 to 3 weeks to see if her legs feel any better. If she feels the same she will return back to taking Crestor. If she feels better, then she will call us back and Dr.Ross will review and decide what statin she should be taking.

## 2011-11-20 ENCOUNTER — Other Ambulatory Visit: Payer: Self-pay | Admitting: Family Medicine

## 2011-12-11 ENCOUNTER — Encounter: Payer: Self-pay | Admitting: Family Medicine

## 2011-12-11 ENCOUNTER — Ambulatory Visit (INDEPENDENT_AMBULATORY_CARE_PROVIDER_SITE_OTHER): Payer: Managed Care, Other (non HMO) | Admitting: Family Medicine

## 2011-12-11 VITALS — BP 132/76 | HR 59 | Temp 98.1°F | Ht 65.0 in | Wt 183.6 lb

## 2011-12-11 DIAGNOSIS — I252 Old myocardial infarction: Secondary | ICD-10-CM

## 2011-12-11 DIAGNOSIS — E785 Hyperlipidemia, unspecified: Secondary | ICD-10-CM

## 2011-12-11 DIAGNOSIS — I10 Essential (primary) hypertension: Secondary | ICD-10-CM

## 2011-12-11 DIAGNOSIS — Z0000 Encounter for general adult medical examination without abnormal findings: Secondary | ICD-10-CM

## 2011-12-11 LAB — BASIC METABOLIC PANEL
BUN: 20 mg/dL (ref 6–23)
CO2: 30 mEq/L (ref 19–32)
Calcium: 9.2 mg/dL (ref 8.4–10.5)
Glucose, Bld: 105 mg/dL — ABNORMAL HIGH (ref 70–99)
Sodium: 142 mEq/L (ref 135–145)

## 2011-12-11 LAB — CBC WITH DIFFERENTIAL/PLATELET
Basophils Absolute: 0 10*3/uL (ref 0.0–0.1)
Eosinophils Absolute: 0.1 10*3/uL (ref 0.0–0.7)
Hemoglobin: 13.1 g/dL (ref 12.0–15.0)
Lymphocytes Relative: 34.5 % (ref 12.0–46.0)
Lymphs Abs: 2.3 10*3/uL (ref 0.7–4.0)
MCHC: 33.1 g/dL (ref 30.0–36.0)
Neutro Abs: 3.7 10*3/uL (ref 1.4–7.7)
RDW: 13.3 % (ref 11.5–14.6)

## 2011-12-11 LAB — HEPATIC FUNCTION PANEL
Albumin: 3.8 g/dL (ref 3.5–5.2)
Alkaline Phosphatase: 52 U/L (ref 39–117)

## 2011-12-11 LAB — POCT URINALYSIS DIPSTICK
Blood, UA: NEGATIVE
Protein, UA: NEGATIVE
Spec Grav, UA: >=1.03
Urobilinogen, UA: 0.2

## 2011-12-11 LAB — LIPID PANEL
Cholesterol: 219 mg/dL — ABNORMAL HIGH (ref 0–200)
HDL: 71.3 mg/dL (ref >39.00)
VLDL: 20.8 mg/dL (ref 0.0–40.0)

## 2011-12-11 MED ORDER — RAMIPRIL 10 MG PO CAPS: 10.0000 mg | ORAL_CAPSULE | Freq: Every day | ORAL | Status: DC

## 2011-12-11 NOTE — Progress Notes (Signed)
Subjective:     Tina [name] is a 66 y.o. female and is here for a comprehensive physical exam. The patient reports no problems.  History   Social History  . Marital Status: Divorced    Spouse Name: N/A    Number of Children: N/A  . Years of Education: N/A   Occupational History  . nurse Other    [employer]    Social History Main Topics  . Smoking status: Former Smoker -- 1.0 packs/day for 30 years    Quit date: 07/17/2007  . Smokeless tobacco: Former Neurosurgeon    Quit date: 06/01/2002  . Alcohol Use: 2.4 oz/week    4 Glasses of wine per week  . Drug Use: No  . Sexually Active: Not Currently -- Female partner(s)   Other Topics Concern  . Not on file   Social History Narrative   Occupation:[place] [facility]DivorcedFormer Smoker quit 2004Drug use-noRegular exercise-treadmill 3x a week   Health Maintenance  Topic Date Due  . Colonoscopy  03/02/1997  . Influenza Vaccine  03/01/2012  . Pap Smear  10/13/2012  . Mammogram  08/03/2013  . Tetanus/tdap  10/13/2020  . Zostavax  Completed    The following portions of the patient's history were reviewed and updated as appropriate: allergies, current medications, past family history, past medical history, past social history, past surgical history and problem list.  Review of Systems Review of Systems  Constitutional: Negative for activity change, appetite change and fatigue.  HENT: Negative for hearing loss, congestion, tinnitus and ear discharge.  dentist -due Eyes: Negative for visual disturbance (see optho q1y -- vision corrected to 20/20 with glasses).  Respiratory: Negative for cough, chest tightness and shortness of breath.   Cardiovascular: Negative for chest pain, palpitations and leg swelling.  Gastrointestinal: Negative for abdominal pain, diarrhea, constipation and abdominal distention.  Genitourinary: Negative for urgency, frequency, decreased urine volume and difficulty urinating.  Musculoskeletal: Negative for back  pain, arthralgias and gait problem.  Skin: Negative for color change, pallor and rash.  Neurological: Negative for dizziness, light-headedness, numbness and headaches.  Hematological: Negative for adenopathy. Does not bruise/bleed easily.  Psychiatric/Behavioral: Negative for suicidal ideas, confusion, sleep disturbance, self-injury, dysphoric mood, decreased concentration and agitation.       Objective:    BP 132/76  Pulse 59  Temp 98.1 F (36.7 C) (Oral)  Ht 5\' 5"  (1.651 m)  Wt 183 lb 9.6 oz (83.28 kg)  BMI 30.55 kg/m2  SpO2 96% General appearance: alert, cooperative, appears stated age and no distress Head: Normocephalic, without obvious abnormality, atraumatic Eyes: conjunctivae/corneas clear. PERRL, EOM's intact. Fundi benign. Ears: normal TM's and external ear canals both ears Nose: Nares normal. Septum midline. Mucosa normal. No drainage or sinus tenderness. Throat: lips, mucosa, and tongue normal; teeth and gums normal Neck: no adenopathy, no carotid bruit, no JVD, supple, symmetrical, trachea midline and thyroid not enlarged, symmetric, no tenderness/mass/nodules Back: symmetric, no curvature. ROM normal. No CVA tenderness. Lungs: clear to auscultation bilaterally Breasts: normal appearance, no masses or tenderness Heart: regular rate and rhythm, S1, S2 normal, no murmur, click, rub or gallop Abdomen: soft, non-tender; bowel sounds normal; no masses,  no organomegaly Pelvic: deferred--at pt request Extremities: extremities normal, atraumatic, no cyanosis or edema Pulses: 2+ and symmetric Skin: Skin color, texture, turgor normal. No rashes or lesions Lymph nodes: Cervical, supraclavicular, and axillary nodes normal. Neurologic: Alert and oriented X 3, normal strength and tone. Normal symmetric reflexes. Normal coordination and gait psych--no depression or anxiety  Assessment:    Healthy female exam.     Plan:    ghm utd--pt refusing colonoscopy, pap and  shingles Check labs See After Visit Summary for Counseling Recommendations

## 2011-12-11 NOTE — Assessment & Plan Note (Signed)
Stable con't meds 

## 2011-12-11 NOTE — Assessment & Plan Note (Signed)
Per cardiology 

## 2011-12-11 NOTE — Assessment & Plan Note (Signed)
Pt off crestor secondary to myalgias--- check labs  If elevated will try something differerent--also rec she take CoQ10

## 2011-12-11 NOTE — Patient Instructions (Addendum)
Preventive Care for Adults, Female A healthy lifestyle and preventive care can promote health and wellness. Preventive health guidelines for women include the following key practices.  A routine yearly physical is a good way to check with your caregiver about your health and preventive screening. It is a chance to share any concerns and updates on your health, and to receive a thorough exam.   Visit your dentist for a routine exam and preventive care every 6 months. Brush your teeth twice a day and floss once a day. Good oral hygiene prevents tooth decay and gum disease.   The frequency of eye exams is based on your age, health, family medical history, use of contact lenses, and other factors. Follow your caregiver's recommendations for frequency of eye exams.   Eat a healthy diet. Foods like vegetables, fruits, whole grains, low-fat dairy products, and lean protein foods contain the nutrients you need without too many calories. Decrease your intake of foods high in solid fats, added sugars, and salt. Eat the right amount of calories for you.Get information about a proper diet from your caregiver, if necessary.   Regular physical exercise is one of the most important things you can do for your health. Most adults should get at least 150 minutes of moderate-intensity exercise (any activity that increases your heart rate and causes you to sweat) each week. In addition, most adults need muscle-strengthening exercises on 2 or more days a week.   Maintain a healthy weight. The body mass index (BMI) is a screening tool to identify possible weight problems. It provides an estimate of body fat based on height and weight. Your caregiver can help determine your BMI, and can help you achieve or maintain a healthy weight.For adults 20 years and older:   A BMI below 18.5 is considered underweight.   A BMI of 18.5 to 24.9 is normal.   A BMI of 25 to 29.9 is considered overweight.   A BMI of 30 and above is  considered obese.   Maintain normal blood lipids and cholesterol levels by exercising and minimizing your intake of saturated fat. Eat a balanced diet with plenty of fruit and vegetables. Blood tests for lipids and cholesterol should begin at age 20 and be repeated every 5 years. If your lipid or cholesterol levels are high, you are over 50, or you are at high risk for heart disease, you may need your cholesterol levels checked more frequently.Ongoing high lipid and cholesterol levels should be treated with medicines if diet and exercise are not effective.   If you smoke, find out from your caregiver how to quit. If you do not use tobacco, do not start.   If you are pregnant, do not drink alcohol. If you are breastfeeding, be very cautious about drinking alcohol. If you are not pregnant and choose to drink alcohol, do not exceed 1 drink per day. One drink is considered to be 12 ounces (355 mL) of beer, 5 ounces (148 mL) of wine, or 1.5 ounces (44 mL) of liquor.   Avoid use of street drugs. Do not share needles with anyone. Ask for help if you need support or instructions about stopping the use of drugs.   High blood pressure causes heart disease and increases the risk of stroke. Your blood pressure should be checked at least every 1 to 2 years. Ongoing high blood pressure should be treated with medicines if weight loss and exercise are not effective.   If you are 55 to 65   years old, ask your caregiver if you should take aspirin to prevent strokes.   Diabetes screening involves taking a blood sample to check your fasting blood sugar level. This should be done once every 3 years, after age 45, if you are within normal weight and without risk factors for diabetes. Testing should be considered at a younger age or be carried out more frequently if you are overweight and have at least 1 risk factor for diabetes.   Breast cancer screening is essential preventive care for women. You should practice "breast  self-awareness." This means understanding the normal appearance and feel of your breasts and may include breast self-examination. Any changes detected, no matter how small, should be reported to a caregiver. Women in their 20s and 30s should have a clinical breast exam (CBE) by a caregiver as part of a regular health exam every 1 to 3 years. After age 40, women should have a CBE every year. Starting at age 40, women should consider having a mammography (breast X-ray test) every year. Women who have a family history of breast cancer should talk to their caregiver about genetic screening. Women at a high risk of breast cancer should talk to their caregivers about having magnetic resonance imaging (MRI) and a mammography every year.   The Pap test is a screening test for cervical cancer. A Pap test can show cell changes on the cervix that might become cervical cancer if left untreated. A Pap test is a procedure in which cells are obtained and examined from the lower end of the uterus (cervix).   Women should have a Pap test starting at age 21.   Between ages 21 and 29, Pap tests should be repeated every 2 years.   Beginning at age 30, you should have a Pap test every 3 years as long as the past 3 Pap tests have been normal.   Some women have medical problems that increase the chance of getting cervical cancer. Talk to your caregiver about these problems. It is especially important to talk to your caregiver if a new problem develops soon after your last Pap test. In these cases, your caregiver may recommend more frequent screening and Pap tests.   The above recommendations are the same for women who have or have not gotten the vaccine for human papillomavirus (HPV).   If you had a hysterectomy for a problem that was not cancer or a condition that could lead to cancer, then you no longer need Pap tests. Even if you no longer need a Pap test, a regular exam is a good idea to make sure no other problems are  starting.   If you are between ages 65 and 70, and you have had normal Pap tests going back 10 years, you no longer need Pap tests. Even if you no longer need a Pap test, a regular exam is a good idea to make sure no other problems are starting.   If you have had past treatment for cervical cancer or a condition that could lead to cancer, you need Pap tests and screening for cancer for at least 20 years after your treatment.   If Pap tests have been discontinued, risk factors (such as a new sexual partner) need to be reassessed to determine if screening should be resumed.   The HPV test is an additional test that may be used for cervical cancer screening. The HPV test looks for the virus that can cause the cell changes on the cervix.   The cells collected during the Pap test can be tested for HPV. The HPV test could be used to screen women aged 30 years and older, and should be used in women of any age who have unclear Pap test results. After the age of 30, women should have HPV testing at the same frequency as a Pap test.   Colorectal cancer can be detected and often prevented. Most routine colorectal cancer screening begins at the age of 50 and continues through age 75. However, your caregiver may recommend screening at an earlier age if you have risk factors for colon cancer. On a yearly basis, your caregiver may provide home test kits to check for hidden blood in the stool. Use of a small camera at the end of a tube, to directly examine the colon (sigmoidoscopy or colonoscopy), can detect the earliest forms of colorectal cancer. Talk to your caregiver about this at age 50, when routine screening begins. Direct examination of the colon should be repeated every 5 to 10 years through age 75, unless early forms of pre-cancerous polyps or small growths are found.   Hepatitis C blood testing is recommended for all people born from 1945 through 1965 and any individual with known risks for hepatitis C.    Practice safe sex. Use condoms and avoid high-risk sexual practices to reduce the spread of sexually transmitted infections (STIs). STIs include gonorrhea, chlamydia, syphilis, trichomonas, herpes, HPV, and human immunodeficiency virus (HIV). Herpes, HIV, and HPV are viral illnesses that have no cure. They can result in disability, cancer, and death. Sexually active women aged 25 and younger should be checked for chlamydia. Older women with new or multiple partners should also be tested for chlamydia. Testing for other STIs is recommended if you are sexually active and at increased risk.   Osteoporosis is a disease in which the bones lose minerals and strength with aging. This can result in serious bone fractures. The risk of osteoporosis can be identified using a bone density scan. Women ages 65 and over and women at risk for fractures or osteoporosis should discuss screening with their caregivers. Ask your caregiver whether you should take a calcium supplement or vitamin D to reduce the rate of osteoporosis.   Menopause can be associated with physical symptoms and risks. Hormone replacement therapy is available to decrease symptoms and risks. You should talk to your caregiver about whether hormone replacement therapy is right for you.   Use sunscreen with sun protection factor (SPF) of 30 or more. Apply sunscreen liberally and repeatedly throughout the day. You should seek shade when your shadow is shorter than you. Protect yourself by wearing long sleeves, pants, a wide-brimmed hat, and sunglasses year round, whenever you are outdoors.   Once a month, do a whole body skin exam, using a mirror to look at the skin on your back. Notify your caregiver of new moles, moles that have irregular borders, moles that are larger than a pencil eraser, or moles that have changed in shape or color.   Stay current with required immunizations.   Influenza. You need a dose every fall (or winter). The composition of  the flu vaccine changes each year, so being vaccinated once is not enough.   Pneumococcal polysaccharide. You need 1 to 2 doses if you smoke cigarettes or if you have certain chronic medical conditions. You need 1 dose at age 65 (or older) if you have never been vaccinated.   Tetanus, diphtheria, pertussis (Tdap, Td). Get 1 dose of   Tdap vaccine if you are younger than age 65, are over 65 and have contact with an infant, are a healthcare worker, are pregnant, or simply want to be protected from whooping cough. After that, you need a Td booster dose every 10 years. Consult your caregiver if you have not had at least 3 tetanus and diphtheria-containing shots sometime in your life or have a deep or dirty wound.   HPV. You need this vaccine if you are a woman age 26 or younger. The vaccine is given in 3 doses over 6 months.   Measles, mumps, rubella (MMR). You need at least 1 dose of MMR if you were born in 1957 or later. You may also need a second dose.   Meningococcal. If you are age 19 to 21 and a first-year college student living in a residence hall, or have one of several medical conditions, you need to get vaccinated against meningococcal disease. You may also need additional booster doses.   Zoster (shingles). If you are age 60 or older, you should get this vaccine.   Varicella (chickenpox). If you have never had chickenpox or you were vaccinated but received only 1 dose, talk to your caregiver to find out if you need this vaccine.   Hepatitis A. You need this vaccine if you have a specific risk factor for hepatitis A virus infection or you simply wish to be protected from this disease. The vaccine is usually given as 2 doses, 6 to 18 months apart.   Hepatitis B. You need this vaccine if you have a specific risk factor for hepatitis B virus infection or you simply wish to be protected from this disease. The vaccine is given in 3 doses, usually over 6 months.  Preventive Services /  Frequency Ages 19 to 39  Blood pressure check.** / Every 1 to 2 years.   Lipid and cholesterol check.** / Every 5 years beginning at age 20.   Clinical breast exam.** / Every 3 years for women in their 20s and 30s.   Pap test.** / Every 2 years from ages 21 through 29. Every 3 years starting at age 30 through age 65 or 70 with a history of 3 consecutive normal Pap tests.   HPV screening.** / Every 3 years from ages 30 through ages 65 to 70 with a history of 3 consecutive normal Pap tests.   Hepatitis C blood test.** / For any individual with known risks for hepatitis C.   Skin self-exam. / Monthly.   Influenza immunization.** / Every year.   Pneumococcal polysaccharide immunization.** / 1 to 2 doses if you smoke cigarettes or if you have certain chronic medical conditions.   Tetanus, diphtheria, pertussis (Tdap, Td) immunization. / A one-time dose of Tdap vaccine. After that, you need a Td booster dose every 10 years.   HPV immunization. / 3 doses over 6 months, if you are 26 and younger.   Measles, mumps, rubella (MMR) immunization. / You need at least 1 dose of MMR if you were born in 1957 or later. You may also need a second dose.   Meningococcal immunization. / 1 dose if you are age 19 to 21 and a first-year college student living in a residence hall, or have one of several medical conditions, you need to get vaccinated against meningococcal disease. You may also need additional booster doses.   Varicella immunization.** / Consult your caregiver.   Hepatitis A immunization.** / Consult your caregiver. 2 doses, 6 to 18 months   apart.   Hepatitis B immunization.** / Consult your caregiver. 3 doses usually over 6 months.  Ages 40 to 64  Blood pressure check.** / Every 1 to 2 years.   Lipid and cholesterol check.** / Every 5 years beginning at age 20.   Clinical breast exam.** / Every year after age 40.   Mammogram.** / Every year beginning at age 40 and continuing for as  long as you are in good health. Consult with your caregiver.   Pap test.** / Every 3 years starting at age 30 through age 65 or 70 with a history of 3 consecutive normal Pap tests.   HPV screening.** / Every 3 years from ages 30 through ages 65 to 70 with a history of 3 consecutive normal Pap tests.   Fecal occult blood test (FOBT) of stool. / Every year beginning at age 50 and continuing until age 75. You may not need to do this test if you get a colonoscopy every 10 years.   Flexible sigmoidoscopy or colonoscopy.** / Every 5 years for a flexible sigmoidoscopy or every 10 years for a colonoscopy beginning at age 50 and continuing until age 75.   Hepatitis C blood test.** / For all people born from 1945 through 1965 and any individual with known risks for hepatitis C.   Skin self-exam. / Monthly.   Influenza immunization.** / Every year.   Pneumococcal polysaccharide immunization.** / 1 to 2 doses if you smoke cigarettes or if you have certain chronic medical conditions.   Tetanus, diphtheria, pertussis (Tdap, Td) immunization.** / A one-time dose of Tdap vaccine. After that, you need a Td booster dose every 10 years.   Measles, mumps, rubella (MMR) immunization. / You need at least 1 dose of MMR if you were born in 1957 or later. You may also need a second dose.   Varicella immunization.** / Consult your caregiver.   Meningococcal immunization.** / Consult your caregiver.   Hepatitis A immunization.** / Consult your caregiver. 2 doses, 6 to 18 months apart.   Hepatitis B immunization.** / Consult your caregiver. 3 doses, usually over 6 months.  Ages 65 and over  Blood pressure check.** / Every 1 to 2 years.   Lipid and cholesterol check.** / Every 5 years beginning at age 20.   Clinical breast exam.** / Every year after age 40.   Mammogram.** / Every year beginning at age 40 and continuing for as long as you are in good health. Consult with your caregiver.   Pap test.** /  Every 3 years starting at age 30 through age 65 or 70 with a 3 consecutive normal Pap tests. Testing can be stopped between 65 and 70 with 3 consecutive normal Pap tests and no abnormal Pap or HPV tests in the past 10 years.   HPV screening.** / Every 3 years from ages 30 through ages 65 or 70 with a history of 3 consecutive normal Pap tests. Testing can be stopped between 65 and 70 with 3 consecutive normal Pap tests and no abnormal Pap or HPV tests in the past 10 years.   Fecal occult blood test (FOBT) of stool. / Every year beginning at age 50 and continuing until age 75. You may not need to do this test if you get a colonoscopy every 10 years.   Flexible sigmoidoscopy or colonoscopy.** / Every 5 years for a flexible sigmoidoscopy or every 10 years for a colonoscopy beginning at age 50 and continuing until age 75.   Hepatitis   C blood test.** / For all people born from 1945 through 1965 and any individual with known risks for hepatitis C.   Osteoporosis screening.** / A one-time screening for women ages 65 and over and women at risk for fractures or osteoporosis.   Skin self-exam. / Monthly.   Influenza immunization.** / Every year.   Pneumococcal polysaccharide immunization.** / 1 dose at age 65 (or older) if you have never been vaccinated.   Tetanus, diphtheria, pertussis (Tdap, Td) immunization. / A one-time dose of Tdap vaccine if you are over 65 and have contact with an infant, are a healthcare worker, or simply want to be protected from whooping cough. After that, you need a Td booster dose every 10 years.   Varicella immunization.** / Consult your caregiver.   Meningococcal immunization.** / Consult your caregiver.   Hepatitis A immunization.** / Consult your caregiver. 2 doses, 6 to 18 months apart.   Hepatitis B immunization.** / Check with your caregiver. 3 doses, usually over 6 months.  ** Family history and personal history of risk and conditions may change your caregiver's  recommendations. Document Released: 07/14/2001 Document Revised: 05/07/2011 Document Reviewed: 10/13/2010 [employer] Patient Information 2012 [employer]. 

## 2012-07-06 ENCOUNTER — Ambulatory Visit: Payer: Managed Care, Other (non HMO) | Admitting: Family Medicine

## 2012-07-16 ENCOUNTER — Other Ambulatory Visit: Payer: Self-pay

## 2012-07-25 ENCOUNTER — Telehealth: Payer: Self-pay | Admitting: Family Medicine

## 2012-07-25 NOTE — Telephone Encounter (Signed)
Patient Information:  Caller Name: Gracelyn  Phone: [phone]  Patient: Tina [name]  Gender: Female  DOB: 27-Aug-1945  Age: 67 Years  PCP: Lelon Perla.   Does the office need to follow up with this patient?: No   RN Note:  prefers to be seen 07/26/12   Reason For Call & Symptoms: fall, onto left knee, denies pain, but swollen and limited range of motion,  Reviewed Health History In EMR: Yes  Reviewed Medications In EMR: Yes  Reviewed Allergies In EMR: Yes  Reviewed Surgeries / Procedures: Yes  Date of Onset of Symptoms: 07/25/2012  Treatments Tried: resting, elevation  Treatments Tried Worked: No  Guideline(s) Used:  Knee Injury  Disposition Per Guideline:  See Today or Tomorrow in Office  Reason For Disposition Reached: Age > 60  Advice Given:  Reassurance - Direct Blow (Contusion, Bruise)  Symptoms are mild pain, swelling, and/or bruising.  Here is some care advice that should help.  Reassurance - Bending or Twisting Injury (Strain, Sprain):  The main symptom is pain that is worse with movement and walking. Swelling can occur. Rarely there may be slight bruising.  Here is some care advice that should help.  Apply a Cold Pack:  Apply a cold pack or an ice bag (wrapped in a moist towel) to the area for 20 minutes. Repeat in 1 hour, then every 4 hours while awake.  Continue this for the first 48 hours after an injury.  This will help decrease pain and swelling.  Elevate the Leg:  Lay down and put your leg on a pillow. This puts (elevates) the knee above the heart.  Do this for 15-20 minutes, 2-3 times a day, for the first two days.  This can also help decrease swelling, bruising, and pain.  Rest vs. Movement:  Movement is generally more healing in the long term than rest.  Continue normal activities (like walking) as much as your pain permits. Complete rest should only be used for the first day or two after an injury. If it really hurts too much to walk, you will need to see the  doctor.  Expected Course:  Pain, swelling, and bruising usually start to get better 2 to 3 days after an injury.  It may take 2 weeks for pain and tenderness of the injured area to go away.  Call Back If:  You become worse.  Appointment Scheduled:  07/26/2012 11:30:00 Appointment Scheduled Provider:  Lelon Perla.

## 2012-07-26 ENCOUNTER — Encounter: Payer: Self-pay | Admitting: Family Medicine

## 2012-07-26 ENCOUNTER — Ambulatory Visit (HOSPITAL_BASED_OUTPATIENT_CLINIC_OR_DEPARTMENT_OTHER)
Admission: RE | Admit: 2012-07-26 | Discharge: 2012-07-26 | Disposition: A | Discharge status: Home / Self Care | Payer: Medicare Other | Source: Ambulatory Visit | Attending: Family Medicine | Admitting: Family Medicine

## 2012-07-26 ENCOUNTER — Ambulatory Visit (INDEPENDENT_AMBULATORY_CARE_PROVIDER_SITE_OTHER): Payer: Medicare Other | Admitting: Family Medicine

## 2012-07-26 VITALS — BP 142/88 | HR 69 | Temp 98.4°F | Resp 14

## 2012-07-26 DIAGNOSIS — M25569 Pain in unspecified knee: Secondary | ICD-10-CM

## 2012-07-26 DIAGNOSIS — M25562 Pain in left knee: Secondary | ICD-10-CM

## 2012-07-26 DIAGNOSIS — R937 Abnormal findings on diagnostic imaging of other parts of musculoskeletal system: Secondary | ICD-10-CM | POA: Insufficient documentation

## 2012-07-26 NOTE — Patient Instructions (Signed)
Knee Pain The knee is the complex joint between your thigh and your lower leg. It is made up of bones, tendons, ligaments, and cartilage. The bones that make up the knee are:  The femur in the thigh.  The tibia and fibula in the lower leg.  The patella or kneecap riding in the groove on the lower femur. CAUSES  Knee pain is a common complaint with many causes. A few of these causes are:  Injury, such as:  A ruptured ligament or tendon injury.  Torn cartilage.  Medical conditions, such as:  Gout  Arthritis  Infections  Overuse, over training or overdoing a physical activity. Knee pain can be minor or severe. Knee pain can accompany debilitating injury. Minor knee problems often respond well to self-care measures or get well on their own. More serious injuries may need medical intervention or even surgery. SYMPTOMS The knee is complex. Symptoms of knee problems can vary widely. Some of the problems are:  Pain with movement and weight bearing.  Swelling and tenderness.  Buckling of the knee.  Inability to straighten or extend your knee.  Your knee locks and you cannot straighten it.  Warmth and redness with pain and fever.  Deformity or dislocation of the kneecap. DIAGNOSIS  Determining what is wrong may be very straight forward such as when there is an injury. It can also be challenging because of the complexity of the knee. Tests to make a diagnosis may include:  Your caregiver taking a history and doing a physical exam.  Routine X-rays can be used to rule out other problems. X-rays will not reveal a cartilage tear. Some injuries of the knee can be diagnosed by:  Arthroscopy a surgical technique by which a small video camera is inserted through tiny incisions on the sides of the knee. This procedure is used to examine and repair internal knee joint problems. Tiny instruments can be used during arthroscopy to repair the torn knee cartilage (meniscus).  Arthrography  is a radiology technique. A contrast liquid is directly injected into the knee joint. Internal structures of the knee joint then become visible on X-ray film.  An MRI scan is a non x-ray radiology procedure in which magnetic fields and a computer produce two- or three-dimensional images of the inside of the knee. Cartilage tears are often visible using an MRI scanner. MRI scans have largely replaced arthrography in diagnosing cartilage tears of the knee.  Blood work.  Examination of the fluid that helps to lubricate the knee joint (synovial fluid). This is done by taking a sample out using a needle and a syringe. TREATMENT The treatment of knee problems depends on the cause. Some of these treatments are:  Depending on the injury, proper casting, splinting, surgery or physical therapy care will be needed.  Give yourself adequate recovery time. Do not overuse your joints. If you begin to get sore during workout routines, back off. Slow down or do fewer repetitions.  For repetitive activities such as cycling or running, maintain your strength and nutrition.  Alternate muscle groups. For example if you are a weight lifter, work the upper body on one day and the lower body the next.  Either tight or weak muscles do not give the proper support for your knee. Tight or weak muscles do not absorb the stress placed on the knee joint. Keep the muscles surrounding the knee strong.  Take care of mechanical problems.  If you have flat feet, orthotics or special shoes may help.   See your caregiver if you need help.  Arch supports, sometimes with wedges on the inner or outer aspect of the heel, can help. These can shift pressure away from the side of the knee most bothered by osteoarthritis.  A brace called an "unloader" brace also may be used to help ease the pressure on the most arthritic side of the knee.  If your caregiver has prescribed crutches, braces, wraps or ice, use as directed. The acronym for  this is PRICE. This means protection, rest, ice, compression and elevation.  Nonsteroidal anti-inflammatory drugs (NSAID's), can help relieve pain. But if taken immediately after an injury, they may actually increase swelling. Take NSAID's with food in your stomach. Stop them if you develop stomach problems. Do not take these if you have a history of ulcers, stomach pain or bleeding from the bowel. Do not take without your caregiver's approval if you have problems with fluid retention, heart failure, or kidney problems.  For ongoing knee problems, physical therapy may be helpful.  Glucosamine and chondroitin are over-the-counter dietary supplements. Both may help relieve the pain of osteoarthritis in the knee. These medicines are different from the usual anti-inflammatory drugs. Glucosamine may decrease the rate of cartilage destruction.  Injections of a corticosteroid drug into your knee joint may help reduce the symptoms of an arthritis flare-up. They may provide pain relief that lasts a few months. You may have to wait a few months between injections. The injections do have a small increased risk of infection, water retention and elevated blood sugar levels.  Hyaluronic acid injected into damaged joints may ease pain and provide lubrication. These injections may work by reducing inflammation. A series of shots may give relief for as long as 6 months.  Topical painkillers. Applying certain ointments to your skin may help relieve the pain and stiffness of osteoarthritis. Ask your pharmacist for suggestions. Many over the-counter products are approved for temporary relief of arthritis pain.  In some countries, doctors often prescribe topical NSAID's for relief of chronic conditions such as arthritis and tendinitis. A review of treatment with NSAID creams found that they worked as well as oral medications but without the serious side effects. PREVENTION  Maintain a healthy weight. Extra pounds put  more strain on your joints.  Get strong, stay limber. Weak muscles are a common cause of knee injuries. Stretching is important. Include flexibility exercises in your workouts.  Be smart about exercise. If you have osteoarthritis, chronic knee pain or recurring injuries, you may need to change the way you exercise. This does not mean you have to stop being active. If your knees ache after jogging or playing basketball, consider switching to swimming, water aerobics or other low-impact activities, at least for a few days a week. Sometimes limiting high-impact activities will provide relief.  Make sure your shoes fit well. Choose footwear that is right for your sport.  Protect your knees. Use the proper gear for knee-sensitive activities. Use kneepads when playing volleyball or laying carpet. Buckle your seat belt every time you drive. Most shattered kneecaps occur in car accidents.  Rest when you are tired. SEEK MEDICAL CARE IF:  You have knee pain that is continual and does not seem to be getting better.  SEEK IMMEDIATE MEDICAL CARE IF:  Your knee joint feels hot to the touch and you have a high fever. MAKE SURE YOU:   Understand these instructions.  Will watch your condition.  Will get help right away if you are not   doing well or get worse. Document Released: 03/15/2007 Document Revised: 08/10/2011 Document Reviewed: 03/15/2007 Exit[employer] Patient Information 2013 [employer].  

## 2012-07-26 NOTE — Progress Notes (Signed)
  Subjective:    Patient ID: Tina [name], female    DOB: Jan 19, 1946, 67 y.o.   MRN: [id number]  HPI Order only   Review of Systems     Objective:   Physical Exam        Assessment & Plan:

## 2012-09-01 ENCOUNTER — Encounter: Payer: Self-pay | Admitting: Internal Medicine

## 2012-09-23 ENCOUNTER — Ambulatory Visit: Payer: Medicare Other | Admitting: Internal Medicine

## 2012-10-31 ENCOUNTER — Ambulatory Visit (INDEPENDENT_AMBULATORY_CARE_PROVIDER_SITE_OTHER): Payer: Medicare Other | Admitting: Internal Medicine

## 2012-10-31 ENCOUNTER — Encounter: Payer: Self-pay | Admitting: Internal Medicine

## 2012-10-31 VITALS — BP 132/80 | HR 66 | Ht 65.0 in | Wt 197.8 lb

## 2012-10-31 DIAGNOSIS — I10 Essential (primary) hypertension: Secondary | ICD-10-CM

## 2012-10-31 NOTE — Progress Notes (Signed)
HPI Patient is a 67 year old with a history of CAD (s/p NSTEMI in 2004 with PTCA/stent to LAD). 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 .    No Known Allergies  Current Outpatient Prescriptions  Medication Sig Dispense Refill  . alendronate (FOSAMAX) 70 MG tablet Take 1 tablet (70 mg total) by mouth every 7 (seven) days. Take with a full glass of water on an empty stomach.  4 tablet  11  . aspirin 81 MG tablet Take 81 mg by mouth daily.        . Calcium Carbonate-Vit D-Min (CALCIUM 1200 PO) 1 tab po qd       . CRESTOR 20 MG tablet TAKE 1/2 TABLET BY MOUTH EVERY NIGHT AT BEDTIME  45 tablet  5  . Fish Oil OIL 2 tabs po qd       . Multiple Vitamin (MULTIVITAMIN) capsule Take 1 capsule by mouth daily.        . multivitamin-lutein (OCUVITE-LUTEIN) CAPS Take 1 capsule by mouth daily.      . ramipril (ALTACE) 10 MG capsule Take 1 capsule (10 mg total) by mouth daily.  90 capsule  3   No current facility-administered medications for this visit.    Past Medical History  Diagnosis Date  . Hyperlipidemia   . Myocardial infarct hx of 2004  . HTN (hypertension)   . Goiter   . Fatty liver     Past Surgical History  Procedure Laterality Date  . Cardiac catheterization  2004    stent  . Sleep study  2012    Family History  Problem Relation Age of Onset  . Coronary artery disease    . Lung cancer    . Ovarian cancer    . Diabetes Maternal Grandmother   . Hypertension Mother   . Heart disease Mother 36    MI--- died from #3 at 41  . Hypertension Father   . Cancer Father 82    ovarian  . Hypertension Sister   . Hypertension Brother   . Cancer Sister 68    brain tumor    History   Social History  . Marital Status: Divorced    Spouse Name: N/A    Number of Children: N/A  . Years of Education: N/A    Occupational History  . nurse Other    [employer]    Social History Main Topics  . Smoking status: Former Smoker -- 1.00 packs/day for 30 years    Quit date: 07/17/2007  . Smokeless tobacco: Former Neurosurgeon    Quit date: 06/01/2002  . Alcohol Use: 2.4 oz/week    4 Glasses of wine per week  . Drug Use: No  . Sexually Active: Not Currently -- Female partner(s)   Other Topics Concern  . Not on file   Social History Narrative   Occupation:[employer]   Divorced   Former Smoker quit 2004   Drug use-no   Regular exercise-treadmill 3x a week    Review of Systems:  All systems reviewed.  They are negative to the above problem except as previously stated.  Vital Signs: BP 132/80  Pulse 66  Ht 5\' 5"  (1.651 m)  Wt 197 lb 12.8 oz (89.721 kg)  BMI 32.92 kg/m2  Physical Exam Patient is in NAD HEENT:  Normocephalic, atraumatic. EOMI, PERRLA.  Neck: JVP is normal.  No bruits.  Lungs: clear to auscultation. No rales no wheezes.  Heart: Regular rate and rhythm. Normal S1, S2. No S3.   No significant murmurs. PMI not displaced.  Abdomen:  Supple, nontender. Normal bowel sounds. No masses. No hepatomegaly.  Extremities:   Good distal pulses throughout. No lower extremity edema.  Musculoskeletal :moving all extremities.  Neuro:   alert and oriented x3.  CN II-XII grossly intact.  EKG  SR 65  Assessment and Plan:  1.  CAD  NO active symptoms  2.  HL  Keep on statin  3.  HTN  Good control  4.  Obesity  Encouraged her to increase activity  Continue with wt loss plans she is starting.

## 2012-12-16 ENCOUNTER — Other Ambulatory Visit: Payer: Self-pay | Admitting: Family Medicine

## 2013-01-04 ENCOUNTER — Other Ambulatory Visit: Payer: Self-pay

## 2013-02-07 ENCOUNTER — Telehealth: Payer: Self-pay

## 2013-02-07 NOTE — Telephone Encounter (Signed)
requesting a refill on crestor

## 2013-04-06 ENCOUNTER — Other Ambulatory Visit: Payer: Self-pay

## 2013-05-08 ENCOUNTER — Other Ambulatory Visit: Payer: Self-pay | Admitting: Family Medicine

## 2013-05-17 ENCOUNTER — Encounter: Payer: Medicare Other | Admitting: Family Medicine

## 2013-05-17 ENCOUNTER — Ambulatory Visit: Payer: Medicare Other | Admitting: Family Medicine

## 2013-05-29 ENCOUNTER — Ambulatory Visit (INDEPENDENT_AMBULATORY_CARE_PROVIDER_SITE_OTHER): Payer: Medicare Other | Admitting: Family Medicine

## 2013-05-29 ENCOUNTER — Encounter: Payer: Self-pay | Admitting: Family Medicine

## 2013-05-29 VITALS — BP 122/80 | HR 79 | Temp 98.0°F | Ht 64.25 in | Wt 216.0 lb

## 2013-05-29 DIAGNOSIS — I10 Essential (primary) hypertension: Secondary | ICD-10-CM

## 2013-05-29 DIAGNOSIS — M899 Disorder of bone, unspecified: Secondary | ICD-10-CM

## 2013-05-29 DIAGNOSIS — Z1239 Encounter for other screening for malignant neoplasm of breast: Secondary | ICD-10-CM

## 2013-05-29 DIAGNOSIS — Z0000 Encounter for general adult medical examination without abnormal findings: Secondary | ICD-10-CM

## 2013-05-29 DIAGNOSIS — R079 Chest pain, unspecified: Secondary | ICD-10-CM

## 2013-05-29 DIAGNOSIS — I252 Old myocardial infarction: Secondary | ICD-10-CM

## 2013-05-29 DIAGNOSIS — M8580 Other specified disorders of bone density and structure, unspecified site: Secondary | ICD-10-CM

## 2013-05-29 DIAGNOSIS — E669 Obesity, unspecified: Secondary | ICD-10-CM | POA: Insufficient documentation

## 2013-05-29 DIAGNOSIS — E785 Hyperlipidemia, unspecified: Secondary | ICD-10-CM

## 2013-05-29 DIAGNOSIS — E049 Nontoxic goiter, unspecified: Secondary | ICD-10-CM

## 2013-05-29 MED ORDER — ALENDRONATE SODIUM 70 MG PO TABS: 70.0000 mg | ORAL_TABLET | ORAL | Status: DC

## 2013-05-29 NOTE — Assessment & Plan Note (Signed)
Check labs 

## 2013-05-29 NOTE — Assessment & Plan Note (Signed)
No change  Pt c/o fullness feeling however And heaviness in base of neck ekg no changes Check labs  pt wants to hold off on [place]

## 2013-05-29 NOTE — Assessment & Plan Note (Signed)
Per cardiology 

## 2013-05-29 NOTE — Progress Notes (Signed)
Pre visit review using our clinic review tool, if applicable. No additional management support is needed unless otherwise documented below in the visit note. 

## 2013-05-29 NOTE — Patient Instructions (Signed)
Preventive Care for Adults, Female A healthy lifestyle and preventive care can promote health and wellness. Preventive health guidelines for women include the following key practices.  A routine yearly physical is a good way to check with your caregiver about your health and preventive screening. It is a chance to share any concerns and updates on your health, and to receive a thorough exam.  Visit your dentist for a routine exam and preventive care every 6 months. Brush your teeth twice a day and floss once a day. Good oral hygiene prevents tooth decay and gum disease.  The frequency of eye exams is based on your age, health, family medical history, use of contact lenses, and other factors. Follow your caregiver's recommendations for frequency of eye exams.  Eat a healthy diet. Foods like vegetables, fruits, whole grains, low-fat dairy products, and lean protein foods contain the nutrients you need without too many calories. Decrease your intake of foods high in solid fats, added sugars, and salt. Eat the right amount of calories for you.Get information about a proper diet from your caregiver, if necessary.  Regular physical exercise is one of the most important things you can do for your health. Most adults should get at least 150 minutes of moderate-intensity exercise (any activity that increases your heart rate and causes you to sweat) each week. In addition, most adults need muscle-strengthening exercises on 2 or more days a week.  Maintain a healthy weight. The body mass index (BMI) is a screening tool to identify possible weight problems. It provides an estimate of body fat based on height and weight. Your caregiver can help determine your BMI, and can help you achieve or maintain a healthy weight.For adults 20 years and older:  A BMI below 18.5 is considered underweight.  A BMI of 18.5 to 24.9 is normal.  A BMI of 25 to 29.9 is considered overweight.  A BMI of 30 and above is  considered obese.  Maintain normal blood lipids and cholesterol levels by exercising and minimizing your intake of saturated fat. Eat a balanced diet with plenty of fruit and vegetables. Blood tests for lipids and cholesterol should begin at age 20 and be repeated every 5 years. If your lipid or cholesterol levels are high, you are over 50, or you are at high risk for heart disease, you may need your cholesterol levels checked more frequently.Ongoing high lipid and cholesterol levels should be treated with medicines if diet and exercise are not effective.  If you smoke, find out from your caregiver how to quit. If you do not use tobacco, do not start.  Lung cancer screening is recommended for adults aged 55 80 years who are at high risk for developing lung cancer because of a history of smoking. Yearly low-dose computed tomography (CT) is recommended for people who have at least a 30-pack-year history of smoking and are a current smoker or have quit within the past 15 years. A pack year of smoking is smoking an average of 1 pack of cigarettes a day for 1 year (for example: 1 pack a day for 30 years or 2 packs a day for 15 years). Yearly screening should continue until the smoker has stopped smoking for at least 15 years. Yearly screening should also be stopped for people who develop a health problem that would prevent them from having lung cancer treatment.  If you are pregnant, do not drink alcohol. If you are breastfeeding, be very cautious about drinking alcohol. If you are   not pregnant and choose to drink alcohol, do not exceed 1 drink per day. One drink is considered to be 12 ounces (355 mL) of beer, 5 ounces (148 mL) of wine, or 1.5 ounces (44 mL) of liquor.  Avoid use of street drugs. Do not share needles with anyone. Ask for help if you need support or instructions about stopping the use of drugs.  High blood pressure causes heart disease and increases the risk of stroke. Your blood pressure  should be checked at least every 1 to 2 years. Ongoing high blood pressure should be treated with medicines if weight loss and exercise are not effective.  If you are 55 to 66 years old, ask your caregiver if you should take aspirin to prevent strokes.  Diabetes screening involves taking a blood sample to check your fasting blood sugar level. This should be done once every 3 years, after age 45, if you are within normal weight and without risk factors for diabetes. Testing should be considered at a younger age or be carried out more frequently if you are overweight and have at least 1 risk factor for diabetes.  Breast cancer screening is essential preventive care for women. You should practice "breast self-awareness." This means understanding the normal appearance and feel of your breasts and may include breast self-examination. Any changes detected, no matter how small, should be reported to a caregiver. Women in their 20s and 30s should have a clinical breast exam (CBE) by a caregiver as part of a regular health exam every 1 to 3 years. After age 40, women should have a CBE every year. Starting at age 40, women should consider having a mammography (breast X-ray test) every year. Women who have a family history of breast cancer should talk to their caregiver about genetic screening. Women at a high risk of breast cancer should talk to their caregivers about having magnetic resonance imaging (MRI) and a mammography every year.  Breast cancer gene (BRCA)-related cancer risk assessment is recommended for women who have family members with BRCA-related cancers. BRCA-related cancers include breast, ovarian, tubal, and peritoneal cancers. Having family members with these cancers may be associated with an increased risk for harmful changes (mutations) in the breast cancer genes BRCA1 and BRCA2. Results of the assessment will determine the need for genetic counseling and BRCA1 and BRCA2 testing.  The Pap test is  a screening test for cervical cancer. A Pap test can show cell changes on the cervix that might become cervical cancer if left untreated. A Pap test is a procedure in which cells are obtained and examined from the lower end of the uterus (cervix).  Women should have a Pap test starting at age 21.  Between ages 21 and 29, Pap tests should be repeated every 2 years.  Beginning at age 30, you should have a Pap test every 3 years as long as the past 3 Pap tests have been normal.  Some women have medical problems that increase the chance of getting cervical cancer. Talk to your caregiver about these problems. It is especially important to talk to your caregiver if a new problem develops soon after your last Pap test. In these cases, your caregiver may recommend more frequent screening and Pap tests.  The above recommendations are the same for women who have or have not gotten the vaccine for human papillomavirus (HPV).  If you had a hysterectomy for a problem that was not cancer or a condition that could lead to cancer, then   you no longer need Pap tests. Even if you no longer need a Pap test, a regular exam is a good idea to make sure no other problems are starting.  If you are between ages 65 and 70, and you have had normal Pap tests going back 10 years, you no longer need Pap tests. Even if you no longer need a Pap test, a regular exam is a good idea to make sure no other problems are starting.  If you have had past treatment for cervical cancer or a condition that could lead to cancer, you need Pap tests and screening for cancer for at least 20 years after your treatment.  If Pap tests have been discontinued, risk factors (such as a new sexual partner) need to be reassessed to determine if screening should be resumed.  The HPV test is an additional test that may be used for cervical cancer screening. The HPV test looks for the virus that can cause the cell changes on the cervix. The cells collected  during the Pap test can be tested for HPV. The HPV test could be used to screen women aged 30 years and older, and should be used in women of any age who have unclear Pap test results. After the age of 30, women should have HPV testing at the same frequency as a Pap test.  Colorectal cancer can be detected and often prevented. Most routine colorectal cancer screening begins at the age of 50 and continues through age 75. However, your caregiver may recommend screening at an earlier age if you have risk factors for colon cancer. On a yearly basis, your caregiver may provide home test kits to check for hidden blood in the stool. Use of a small camera at the end of a tube, to directly examine the colon (sigmoidoscopy or colonoscopy), can detect the earliest forms of colorectal cancer. Talk to your caregiver about this at age 50, when routine screening begins. Direct examination of the colon should be repeated every 5 to 10 years through age 75, unless early forms of pre-cancerous polyps or small growths are found.  Hepatitis C blood testing is recommended for all people born from 1945 through 1965 and any individual with known risks for hepatitis C.  Practice safe sex. Use condoms and avoid high-risk sexual practices to reduce the spread of sexually transmitted infections (STIs). STIs include gonorrhea, chlamydia, syphilis, trichomonas, herpes, HPV, and human immunodeficiency virus (HIV). Herpes, HIV, and HPV are viral illnesses that have no cure. They can result in disability, cancer, and death. Sexually active women aged 25 and younger should be checked for chlamydia. Older women with new or multiple partners should also be tested for chlamydia. Testing for other STIs is recommended if you are sexually active and at increased risk.  Osteoporosis is a disease in which the bones lose minerals and strength with aging. This can result in serious bone fractures. The risk of osteoporosis can be identified using a  bone density scan. Women ages 65 and over and women at risk for fractures or osteoporosis should discuss screening with their caregivers. Ask your caregiver whether you should take a calcium supplement or vitamin D to reduce the rate of osteoporosis.  Menopause can be associated with physical symptoms and risks. Hormone replacement therapy is available to decrease symptoms and risks. You should talk to your caregiver about whether hormone replacement therapy is right for you.  Use sunscreen. Apply sunscreen liberally and repeatedly throughout the day. You should seek shade   when your shadow is shorter than you. Protect yourself by wearing long sleeves, pants, a wide-brimmed hat, and sunglasses year round, whenever you are outdoors.  Once a month, do a whole body skin exam, using a mirror to look at the skin on your back. Notify your caregiver of new moles, moles that have irregular borders, moles that are larger than a pencil eraser, or moles that have changed in shape or color.  Stay current with required immunizations.  Influenza vaccine. All adults should be immunized every year.  Tetanus, diphtheria, and acellular pertussis (Td, Tdap) vaccine. Pregnant women should receive 1 dose of Tdap vaccine during each pregnancy. The dose should be obtained regardless of the length of time since the last dose. Immunization is preferred during the 27th to 36th week of gestation. An adult who has not previously received Tdap or who does not know her vaccine status should receive 1 dose of Tdap. This initial dose should be followed by tetanus and diphtheria toxoids (Td) booster doses every 10 years. Adults with an unknown or incomplete history of completing a 3-dose immunization series with Td-containing vaccines should begin or complete a primary immunization series including a Tdap dose. Adults should receive a Td booster every 10 years.  Varicella vaccine. An adult without evidence of immunity to varicella  should receive 2 doses or a second dose if she has previously received 1 dose. Pregnant females who do not have evidence of immunity should receive the first dose after pregnancy. This first dose should be obtained before leaving the health care facility. The second dose should be obtained 4 8 weeks after the first dose.  Human papillomavirus (HPV) vaccine. Females aged 13 26 years who have not received the vaccine previously should obtain the 3-dose series. The vaccine is not recommended for use in pregnant females. However, pregnancy testing is not needed before receiving a dose. If a female is found to be pregnant after receiving a dose, no treatment is needed. In that case, the remaining doses should be delayed until after the pregnancy. Immunization is recommended for any person with an immunocompromised condition through the age of 26 years if she did not get any or all doses earlier. During the 3-dose series, the second dose should be obtained 4 8 weeks after the first dose. The third dose should be obtained 24 weeks after the first dose and 16 weeks after the second dose.  Zoster vaccine. One dose is recommended for adults aged 60 years or older unless certain conditions are present.  Measles, mumps, and rubella (MMR) vaccine. Adults born before 1957 generally are considered immune to measles and mumps. Adults born in 1957 or later should have 1 or more doses of MMR vaccine unless there is a contraindication to the vaccine or there is laboratory evidence of immunity to each of the three diseases. A routine second dose of MMR vaccine should be obtained at least 28 days after the first dose for students attending postsecondary schools, health care workers, or international travelers. People who received inactivated measles vaccine or an unknown type of measles vaccine during 1963 1967 should receive 2 doses of MMR vaccine. People who received inactivated mumps vaccine or an unknown type of mumps vaccine  before 1979 and are at high risk for mumps infection should consider immunization with 2 doses of MMR vaccine. For females of childbearing age, rubella immunity should be determined. If there is no evidence of immunity, females who are not pregnant should be vaccinated. If there   is no evidence of immunity, females who are pregnant should delay immunization until after pregnancy. Unvaccinated health care workers born before 1957 who lack laboratory evidence of measles, mumps, or rubella immunity or laboratory confirmation of disease should consider measles and mumps immunization with 2 doses of MMR vaccine or rubella immunization with 1 dose of MMR vaccine.  Pneumococcal 13-valent conjugate (PCV13) vaccine. When indicated, a person who is uncertain of her immunization history and has no record of immunization should receive the PCV13 vaccine. An adult aged 19 years or older who has certain medical conditions and has not been previously immunized should receive 1 dose of PCV13 vaccine. This PCV13 should be followed with a dose of pneumococcal polysaccharide (PPSV23) vaccine. The PPSV23 vaccine dose should be obtained at least 8 weeks after the dose of PCV13 vaccine. An adult aged 19 years or older who has certain medical conditions and previously received 1 or more doses of PPSV23 vaccine should receive 1 dose of PCV13. The PCV13 vaccine dose should be obtained 1 or more years after the last PPSV23 vaccine dose.  Pneumococcal polysaccharide (PPSV23) vaccine. When PCV13 is also indicated, PCV13 should be obtained first. All adults aged 65 years and older should be immunized. An adult younger than age 65 years who has certain medical conditions should be immunized. Any person who resides in a nursing home or long-term care facility should be immunized. An adult smoker should be immunized. People with an immunocompromised condition and certain other conditions should receive both PCV13 and PPSV23 vaccines. People  with human immunodeficiency virus (HIV) infection should be immunized as soon as possible after diagnosis. Immunization during chemotherapy or radiation therapy should be avoided. Routine use of PPSV23 vaccine is not recommended for American Indians, [place] Natives, or people younger than 65 years unless there are medical conditions that require PPSV23 vaccine. When indicated, people who have unknown immunization and have no record of immunization should receive PPSV23 vaccine. One-time revaccination 5 years after the first dose of PPSV23 is recommended for people aged 19 64 years who have chronic kidney failure, nephrotic syndrome, asplenia, or immunocompromised conditions. People who received 1 2 doses of PPSV23 before age 65 years should receive another dose of PPSV23 vaccine at age 65 years or later if at least 5 years have passed since the previous dose. Doses of PPSV23 are not needed for people immunized with PPSV23 at or after age 65 years.  Meningococcal vaccine. Adults with asplenia or persistent complement component deficiencies should receive 2 doses of quadrivalent meningococcal conjugate (MenACWY-D) vaccine. The doses should be obtained at least 2 months apart. Microbiologists working with certain meningococcal bacteria, military recruits, people at risk during an outbreak, and people who travel to or live in countries with a high rate of meningitis should be immunized. A first-year college student up through age 21 years who is living in a residence hall should receive a dose if she did not receive a dose on or after her 16th birthday. Adults who have certain high-risk conditions should receive one or more doses of vaccine.  Hepatitis A vaccine. Adults who wish to be protected from this disease, have certain high-risk conditions, work with hepatitis A-infected animals, work in hepatitis A research labs, or travel to or work in countries with a high rate of hepatitis A should be immunized. Adults  who were previously unvaccinated and who anticipate close contact with an international adoptee during the first 60 days after arrival in the [place] from a country   with a high rate of hepatitis A should be immunized.  Hepatitis B vaccine. Adults who wish to be protected from this disease, have certain high-risk conditions, may be exposed to blood or other infectious body fluids, are household contacts or sex partners of hepatitis B positive people, are clients or workers in certain care facilities, or travel to or work in countries with a high rate of hepatitis B should be immunized.  Haemophilus influenzae type b (Hib) vaccine. A previously unvaccinated person with asplenia or sickle cell disease or having a scheduled splenectomy should receive 1 dose of Hib vaccine. Regardless of previous immunization, a recipient of a hematopoietic stem cell transplant should receive a 3-dose series 6 12 months after her successful transplant. Hib vaccine is not recommended for adults with HIV infection. Preventive Services / Frequency Ages 19 to 39  Blood pressure check.** / Every 1 to 2 years.  Lipid and cholesterol check.** / Every 5 years beginning at age 20.  Clinical breast exam.** / Every 3 years for women in their 20s and 30s.  BRCA-related cancer risk assessment.** / For women who have family members with a BRCA-related cancer (breast, ovarian, tubal, or peritoneal cancers).  Pap test.** / Every 2 years from ages 21 through 29. Every 3 years starting at age 30 through age 65 or 70 with a history of 3 consecutive normal Pap tests.  HPV screening.** / Every 3 years from ages 30 through ages 65 to 70 with a history of 3 consecutive normal Pap tests.  Hepatitis C blood test.** / For any individual with known risks for hepatitis C.  Skin self-exam. / Monthly.  Influenza vaccine. / Every year.  Tetanus, diphtheria, and acellular pertussis (Tdap, Td) vaccine.** / Consult your caregiver. Pregnant  women should receive 1 dose of Tdap vaccine during each pregnancy. 1 dose of Td every 10 years.  Varicella vaccine.** / Consult your caregiver. Pregnant females who do not have evidence of immunity should receive the first dose after pregnancy.  HPV vaccine. / 3 doses over 6 months, if 26 and younger. The vaccine is not recommended for use in pregnant females. However, pregnancy testing is not needed before receiving a dose.  Measles, mumps, rubella (MMR) vaccine.** / You need at least 1 dose of MMR if you were born in 1957 or later. You may also need a 2nd dose. For females of childbearing age, rubella immunity should be determined. If there is no evidence of immunity, females who are not pregnant should be vaccinated. If there is no evidence of immunity, females who are pregnant should delay immunization until after pregnancy.  Pneumococcal 13-valent conjugate (PCV13) vaccine.** / Consult your caregiver.  Pneumococcal polysaccharide (PPSV23) vaccine.** / 1 to 2 doses if you smoke cigarettes or if you have certain conditions.  Meningococcal vaccine.** / 1 dose if you are age 19 to 21 years and a first-year college student living in a residence hall, or have one of several medical conditions, you need to get vaccinated against meningococcal disease. You may also need additional booster doses.  Hepatitis A vaccine.** / Consult your caregiver.  Hepatitis B vaccine.** / Consult your caregiver.  Haemophilus influenzae type b (Hib) vaccine.** / Consult your caregiver. Ages 40 to 64  Blood pressure check.** / Every 1 to 2 years.  Lipid and cholesterol check.** / Every 5 years beginning at age 20.  Lung cancer screening. / Every year if you are aged 55 80 years and have a 30-pack-year history of smoking and   currently smoke or have quit within the past 15 years. Yearly screening is stopped once you have quit smoking for at least 15 years or develop a health problem that would prevent you from having  lung cancer treatment.  Clinical breast exam.** / Every year after age 40.  BRCA-related cancer risk assessment.** / For women who have family members with a BRCA-related cancer (breast, ovarian, tubal, or peritoneal cancers).  Mammogram.** / Every year beginning at age 40 and continuing for as long as you are in good health. Consult with your caregiver.  Pap test.** / Every 3 years starting at age 30 through age 65 or 70 with a history of 3 consecutive normal Pap tests.  HPV screening.** / Every 3 years from ages 30 through ages 65 to 70 with a history of 3 consecutive normal Pap tests.  Fecal occult blood test (FOBT) of stool. / Every year beginning at age 50 and continuing until age 75. You may not need to do this test if you get a colonoscopy every 10 years.  Flexible sigmoidoscopy or colonoscopy.** / Every 5 years for a flexible sigmoidoscopy or every 10 years for a colonoscopy beginning at age 50 and continuing until age 75.  Hepatitis C blood test.** / For all people born from 1945 through 1965 and any individual with known risks for hepatitis C.  Skin self-exam. / Monthly.  Influenza vaccine. / Every year.  Tetanus, diphtheria, and acellular pertussis (Tdap/Td) vaccine.** / Consult your caregiver. Pregnant women should receive 1 dose of Tdap vaccine during each pregnancy. 1 dose of Td every 10 years.  Varicella vaccine.** / Consult your caregiver. Pregnant females who do not have evidence of immunity should receive the first dose after pregnancy.  Zoster vaccine.** / 1 dose for adults aged 60 years or older.  Measles, mumps, rubella (MMR) vaccine.** / You need at least 1 dose of MMR if you were born in 1957 or later. You may also need a 2nd dose. For females of childbearing age, rubella immunity should be determined. If there is no evidence of immunity, females who are not pregnant should be vaccinated. If there is no evidence of immunity, females who are pregnant should delay  immunization until after pregnancy.  Pneumococcal 13-valent conjugate (PCV13) vaccine.** / Consult your caregiver.  Pneumococcal polysaccharide (PPSV23) vaccine.** / 1 to 2 doses if you smoke cigarettes or if you have certain conditions.  Meningococcal vaccine.** / Consult your caregiver.  Hepatitis A vaccine.** / Consult your caregiver.  Hepatitis B vaccine.** / Consult your caregiver.  Haemophilus influenzae type b (Hib) vaccine.** / Consult your caregiver. Ages 65 and over  Blood pressure check.** / Every 1 to 2 years.  Lipid and cholesterol check.** / Every 5 years beginning at age 20.  Lung cancer screening. / Every year if you are aged 55 80 years and have a 30-pack-year history of smoking and currently smoke or have quit within the past 15 years. Yearly screening is stopped once you have quit smoking for at least 15 years or develop a health problem that would prevent you from having lung cancer treatment.  Clinical breast exam.** / Every year after age 40.  BRCA-related cancer risk assessment.** / For women who have family members with a BRCA-related cancer (breast, ovarian, tubal, or peritoneal cancers).  Mammogram.** / Every year beginning at age 40 and continuing for as long as you are in good health. Consult with your caregiver.  Pap test.** / Every 3 years starting at age   30 through age 65 or 70 with a 3 consecutive normal Pap tests. Testing can be stopped between 65 and 70 with 3 consecutive normal Pap tests and no abnormal Pap or HPV tests in the past 10 years.  HPV screening.** / Every 3 years from ages 30 through ages 65 or 70 with a history of 3 consecutive normal Pap tests. Testing can be stopped between 65 and 70 with 3 consecutive normal Pap tests and no abnormal Pap or HPV tests in the past 10 years.  Fecal occult blood test (FOBT) of stool. / Every year beginning at age 50 and continuing until age 75. You may not need to do this test if you get a colonoscopy  every 10 years.  Flexible sigmoidoscopy or colonoscopy.** / Every 5 years for a flexible sigmoidoscopy or every 10 years for a colonoscopy beginning at age 50 and continuing until age 75.  Hepatitis C blood test.** / For all people born from 1945 through 1965 and any individual with known risks for hepatitis C.  Osteoporosis screening.** / A one-time screening for women ages 65 and over and women at risk for fractures or osteoporosis.  Skin self-exam. / Monthly.  Influenza vaccine. / Every year.  Tetanus, diphtheria, and acellular pertussis (Tdap/Td) vaccine.** / 1 dose of Td every 10 years.  Varicella vaccine.** / Consult your caregiver.  Zoster vaccine.** / 1 dose for adults aged 60 years or older.  Pneumococcal 13-valent conjugate (PCV13) vaccine.** / Consult your caregiver.  Pneumococcal polysaccharide (PPSV23) vaccine.** / 1 dose for all adults aged 65 years and older.  Meningococcal vaccine.** / Consult your caregiver.  Hepatitis A vaccine.** / Consult your caregiver.  Hepatitis B vaccine.** / Consult your caregiver.  Haemophilus influenzae type b (Hib) vaccine.** / Consult your caregiver. ** Family history and personal history of risk and conditions may change your caregiver's recommendations. Document Released: 07/14/2001 Document Revised: 09/12/2012 Document Reviewed: 10/13/2010 Exit[employer] Patient Information 2014 [employer].  

## 2013-05-29 NOTE — Progress Notes (Signed)
Subjective:    Tina [name] is a 67 y.o. female who presents for Medicare Annual/Subsequent preventive examination.  Preventive Screening-Counseling & Management  Tobacco History  Smoking status  . Former Smoker -- 1.00 packs/day for 30 years  . Quit date: 07/17/2007  Smokeless tobacco  . Former Neurosurgeon  . Quit date: 06/01/2002     Problems Prior to Visit 1.   Current Problems (verified) Patient Active Problem List   Diagnosis Date Noted  . Obesity (BMI 30-39.9) 05/29/2013  . Otitis externa 09/30/2010  . CONJUNCTIVITIS, BACTERIAL 12/16/2009  . SINUSITIS- ACUTE-NOS 08/01/2009  . TOBACCO ABUSE, HX OF 07/22/2009  . OTITIS EXTERNA, ACUTE, RIGHT 02/08/2009  . GOITER NOS 02/10/2007  . HYPERLIPIDEMIA 02/10/2007  . HYPERTENSION 02/10/2007  . MYOCARDIAL INFARCTION, HX OF 02/10/2007  . DIZZINESS 02/10/2007  . EDEMA LEG 02/10/2007  . FATTY LIVER DISEASE, HX OF 02/10/2007    Medications Prior to Visit Current Outpatient Prescriptions on File Prior to Visit  Medication Sig Dispense Refill  . aspirin 81 MG tablet Take 81 mg by mouth daily.        . Calcium Carbonate-Vit D-Min (CALCIUM 1200 PO) 1 tab po qd       . CRESTOR 20 MG tablet TAKE 1/2 TABLET BY MOUTH EVERY NIGHT AT BEDTIME  45 tablet  5  . Fish Oil OIL 2 tabs po qd       . Multiple Vitamin (MULTIVITAMIN) capsule Take 1 capsule by mouth daily.        . ramipril (ALTACE) 10 MG capsule 1 cap by mouth daily--office visit due now  90 capsule  0   No current facility-administered medications on file prior to visit.    Current Medications (verified) Current Outpatient Prescriptions  Medication Sig Dispense Refill  . alendronate (FOSAMAX) 70 MG tablet Take 1 tablet (70 mg total) by mouth every 7 (seven) days. Take with a full glass of water on an empty stomach.  4 tablet  11  . aspirin 81 MG tablet Take 81 mg by mouth daily.        . Calcium Carbonate-Vit D-Min (CALCIUM 1200 PO) 1 tab po qd       . CRESTOR 20 MG tablet TAKE  1/2 TABLET BY MOUTH EVERY NIGHT AT BEDTIME  45 tablet  5  . Fish Oil OIL 2 tabs po qd       . Multiple Vitamin (MULTIVITAMIN) capsule Take 1 capsule by mouth daily.        . ramipril (ALTACE) 10 MG capsule 1 cap by mouth daily--office visit due now  90 capsule  0   No current facility-administered medications for this visit.     Allergies (verified) Review of patient's allergies indicates no known allergies.   PAST HISTORY  Family History Family History  Problem Relation Age of Onset  . Coronary artery disease    . Lung cancer    . Ovarian cancer    . Diabetes Maternal Grandmother   . Hypertension Mother   . Heart disease Mother 32    MI--- died from #3 at 72  . Hypertension Father   . Cancer Father 66    ovarian  . Hypertension Sister   . Hypertension Brother   . Cancer Sister 69    brain tumor    Social History History  Substance Use Topics  . Smoking status: Former Smoker -- 1.00 packs/day for 30 years    Quit date: 07/17/2007  . Smokeless tobacco: Former Neurosurgeon  Quit date: 06/01/2002  . Alcohol Use: 2.4 oz/week    4 Glasses of wine per week     Are there smokers in your home (other than you)? No  Risk Factors Current exercise habits: The patient does not participate in regular exercise at present.  Dietary issues discussed: na   Cardiac risk factors: advanced age (older than 49 for men, 44 for women), dyslipidemia, hypertension, obesity (BMI >= 30 kg/m2) and sedentary lifestyle.  Depression Screen (Note: if answer to either of the following is "Yes", a more complete depression screening is indicated)   Over the past two weeks, have you felt down, depressed or hopeless? No  Over the past two weeks, have you felt little interest or pleasure in doing things? No  Have you lost interest or pleasure in daily life? No  Do you often feel hopeless? No  Do you cry easily over simple problems? No  Activities of Daily Living In your present state of health, do you  have any difficulty performing the following activities?:  Driving? No Managing money?  No Feeding yourself? No Getting from bed to chair? No Climbing a flight of stairs? No Preparing food and eating?: No Bathing or showering? No Getting dressed: No Getting to the toilet? No Using the toilet:No Moving around from place to place: No In the past year have you fallen or had a near fall?:No   Are you sexually active?  Yes  Do you have more than one partner?  No  Hearing Difficulties: No Do you often ask people to speak up or repeat themselves? No Do you experience ringing or noises in your ears? No Do you have difficulty understanding soft or whispered voices? No   Do you feel that you have a problem with memory? No  Do you often misplace items? No  Do you feel safe at home?  No  Cognitive Testing  Alert? Yes  Normal Appearance?Yes  Oriented to person? Yes  Place? Yes   Time? Yes  Recall of three objects?  Yes  Can perform simple calculations? Yes  Displays appropriate judgment?Yes  Can read the correct time from a watch face?Yes   Advanced Directives have been discussed with the patient? Yes  List the Names of Other Physician/Practitioners you currently use: 1.  none  Indicate any recent Medical Services you may have received from other than [facility] providers in the past year (date may be approximate).  Immunization History  Administered Date(s) Administered  . Tdap 10/14/2010  . Zoster 10/14/2010    Screening Tests Health Maintenance  Topic Date Due  . Colonoscopy  03/02/1997  . Influenza Vaccine  05/29/2014  . Pneumococcal Polysaccharide Vaccine Age 25 And Over  05/29/2014  . Mammogram  08/03/2013  . Tetanus/tdap  10/13/2020  . Zostavax  Completed    All answers were reviewed with the patient and necessary referrals were made:  Loreen Freud, DO   05/29/2013   History reviewed:  She  has a past medical history of Hyperlipidemia; Myocardial infarct (hx of  2004); HTN (hypertension); Goiter; and Fatty liver. She  does not have any pertinent problems on file. She  has past surgical history that includes Cardiac catheterization (2004) and sleep study (2012). Her family history includes Cancer (age of onset: 85) in her sister; Cancer (age of onset: 55) in her father; Coronary artery disease in an other family member; Diabetes in her maternal grandmother; Heart disease (age of onset: 31) in her mother; Hypertension in her brother, father,  mother, and sister; Lung cancer in an other family member; Ovarian cancer in an other family member. She  reports that she quit smoking about 5 years ago. She quit smokeless tobacco use about 11 years ago. She reports that she drinks about 2.4 ounces of alcohol per week. She reports that she does not use illicit drugs. She has a current medication list which includes the following prescription(s): alendronate, aspirin, calcium carbonate-vit d-min, crestor, fish oil, multivitamin, and ramipril. Current Outpatient Prescriptions on File Prior to Visit  Medication Sig Dispense Refill  . aspirin 81 MG tablet Take 81 mg by mouth daily.        . Calcium Carbonate-Vit D-Min (CALCIUM 1200 PO) 1 tab po qd       . CRESTOR 20 MG tablet TAKE 1/2 TABLET BY MOUTH EVERY NIGHT AT BEDTIME  45 tablet  5  . Fish Oil OIL 2 tabs po qd       . Multiple Vitamin (MULTIVITAMIN) capsule Take 1 capsule by mouth daily.        . ramipril (ALTACE) 10 MG capsule 1 cap by mouth daily--office visit due now  90 capsule  0   No current facility-administered medications on file prior to visit.   She has No Known Allergies.  Review of Systems Review of Systems  Constitutional: Negative for activity change, appetite change and fatigue.  HENT: Negative for hearing loss, congestion, tinnitus and ear discharge.  dentist q39m Eyes: Negative for visual disturbance (see optho q1y -- vision corrected to 20/20 with glasses).  Respiratory: Negative for cough,  chest tightness and shortness of breath.   Cardiovascular: Negative for chest pain, palpitations and leg swelling.  Gastrointestinal: Negative for abdominal pain, diarrhea, constipation and abdominal distention.  Genitourinary: Negative for urgency, frequency, decreased urine volume and difficulty urinating.  Musculoskeletal: Negative for back pain, arthralgias and gait problem.  Skin: Negative for color change, pallor and rash.  Neurological: Negative for dizziness, light-headedness, numbness and headaches.  Hematological: Negative for adenopathy. Does not bruise/bleed easily.  Psychiatric/Behavioral: Negative for suicidal ideas, confusion, sleep disturbance, self-injury, dysphoric mood, decreased concentration and agitation.        Objective:     Vision by Snellen chart: opth Body mass index is 36.79 kg/(m^2). BP 122/80  Pulse 79  Temp(Src) 98 F (36.7 C) (Oral)  Ht 5' 4.25" (1.632 m)  Wt 216 lb (97.977 kg)  BMI 36.79 kg/m2  SpO2 99%  BP 122/80  Pulse 79  Temp(Src) 98 F (36.7 C) (Oral)  Ht 5' 4.25" (1.632 m)  Wt 216 lb (97.977 kg)  BMI 36.79 kg/m2  SpO2 99% General appearance: alert, cooperative, appears stated age and no distress Head: Normocephalic, without obvious abnormality, atraumatic Eyes: conjunctivae/corneas clear. PERRL, EOM's intact. Fundi benign. Ears: normal TM's and external ear canals both ears Nose: Nares normal. Septum midline. Mucosa normal. No drainage or sinus tenderness. Throat: lips, mucosa, and tongue normal; teeth and gums normal Neck: no adenopathy, supple, symmetrical, trachea midline and thyroid not enlarged, symmetric, no tenderness/mass/nodules Back: symmetric, no curvature. ROM normal. No CVA tenderness. Lungs: clear to auscultation bilaterally Breasts: normal appearance, no masses or tenderness Heart: regular rate and rhythm, S1, S2 normal, no murmur, click, rub or gallop Abdomen: soft, non-tender; bowel sounds normal; no masses,  no  organomegaly Pelvic: not indicated; post-menopausal, no abnormal Pap smears in past Extremities: extremities normal, atraumatic, no cyanosis or edema Pulses: 2+ and symmetric Skin: Skin color, texture, turgor normal. No rashes or lesions Lymph nodes: Cervical,  supraclavicular, and axillary nodes normal. Neurologic: Alert and oriented X 3, normal strength and tone. Normal symmetric reflexes. Normal coordination and gait Psych=== no depression, no anxiety      Assessment:     cpe      Plan:     During the course of the visit the patient was educated and counseled about appropriate screening and preventive services including:    Influenza vaccine  Td vaccine  Screening electrocardiogram  Screening mammography  Bone densitometry screening  Colorectal cancer screening  Diabetes screening  Glaucoma screening  Advanced directives: has NO advanced directive - not interested in additional information  Diet review for nutrition referral? Yes ____  Not Indicated __x   Patient Instructions (the written plan) was given to the patient.  Medicare Attestation I have personally reviewed: The patient's medical and social history Their use of alcohol, tobacco or illicit drugs Their current medications and supplements The patient's functional ability including ADLs,fall risks, home safety risks, cognitive, and hearing and visual impairment Diet and physical activities Evidence for depression or mood disorders  The patient's weight, height, BMI, and visual acuity have been recorded in the chart.  I have made referrals, counseling, and provided education to the patient based on review of the above and I have provided the patient with a written personalized care plan for preventive services.     Loreen Freud, DO   05/29/2013

## 2013-05-30 ENCOUNTER — Other Ambulatory Visit (INDEPENDENT_AMBULATORY_CARE_PROVIDER_SITE_OTHER): Payer: Medicare Other

## 2013-05-30 DIAGNOSIS — E785 Hyperlipidemia, unspecified: Secondary | ICD-10-CM

## 2013-05-30 LAB — LDL CHOLESTEROL, DIRECT: Direct LDL: 123.7 mg/dL

## 2013-05-30 LAB — CBC WITH DIFFERENTIAL/PLATELET
Basophils Absolute: 0 10*3/uL (ref 0.0–0.1)
Basophils Relative: 0.3 % (ref 0.0–3.0)
Eosinophils Absolute: 0.1 10*3/uL (ref 0.0–0.7)
HCT: 39.2 % (ref 36.0–46.0)
Lymphocytes Relative: 30.6 % (ref 12.0–46.0)
Lymphs Abs: 2.1 10*3/uL (ref 0.7–4.0)
MCHC: 33.8 g/dL (ref 30.0–36.0)
MCV: 89.4 fl (ref 78.0–100.0)
Monocytes Absolute: 0.5 10*3/uL (ref 0.1–1.0)
Neutro Abs: 4.2 10*3/uL (ref 1.4–7.7)
Neutrophils Relative %: 60.8 % (ref 43.0–77.0)
Platelets: 268 10*3/uL (ref 150.0–400.0)
RBC: 4.39 Mil/uL (ref 3.87–5.11)
RDW: 13.1 % (ref 11.5–14.6)
WBC: 6.9 10*3/uL (ref 4.5–10.5)

## 2013-05-30 LAB — POCT URINALYSIS DIPSTICK
Glucose, UA: NEGATIVE
Ketones, UA: NEGATIVE
Leukocytes, UA: NEGATIVE
Protein, UA: NEGATIVE
Urobilinogen, UA: 0.2
pH, UA: 6.5

## 2013-05-30 LAB — BASIC METABOLIC PANEL
BUN: 24 mg/dL — ABNORMAL HIGH (ref 6–23)
CO2: 29 mEq/L (ref 19–32)
Calcium: 9.5 mg/dL (ref 8.4–10.5)
Chloride: 102 mEq/L (ref 96–112)
Creatinine, Ser: 0.7 mg/dL (ref 0.4–1.2)

## 2013-05-30 LAB — LIPID PANEL
HDL: 73.3 mg/dL (ref >39.00)
Total CHOL/HDL Ratio: 3
Triglycerides: 70 mg/dL (ref 0.0–149.0)
VLDL: 14 mg/dL (ref 0.0–40.0)

## 2013-05-30 LAB — HEPATIC FUNCTION PANEL
ALT: 21 U/L (ref 0–35)
AST: 22 U/L (ref 0–37)
Alkaline Phosphatase: 48 U/L (ref 39–117)
Bilirubin, Direct: 0.1 mg/dL (ref 0.0–0.3)
Total Bilirubin: 0.9 mg/dL (ref 0.3–1.2)
Total Protein: 7.3 g/dL (ref 6.0–8.3)

## 2013-05-31 ENCOUNTER — Other Ambulatory Visit: Payer: Self-pay

## 2013-05-31 MED ORDER — ROSUVASTATIN CALCIUM 20 MG PO TABS: ORAL_TABLET | ORAL | Status: DC

## 2013-06-23 ENCOUNTER — Ambulatory Visit
Admission: RE | Admit: 2013-06-23 | Discharge: 2013-06-23 | Disposition: A | Discharge status: Home / Self Care | Payer: Medicare Other | Source: Ambulatory Visit | Attending: Family Medicine | Admitting: Family Medicine

## 2013-06-23 DIAGNOSIS — Z1239 Encounter for other screening for malignant neoplasm of breast: Secondary | ICD-10-CM

## 2013-06-23 DIAGNOSIS — M8580 Other specified disorders of bone density and structure, unspecified site: Secondary | ICD-10-CM

## 2013-08-08 ENCOUNTER — Encounter: Payer: Self-pay | Admitting: Family Medicine

## 2013-09-22 ENCOUNTER — Other Ambulatory Visit: Payer: Self-pay | Admitting: Family Medicine

## 2013-11-26 NOTE — Progress Notes (Signed)
HPI Patient is a 68 year old with a history of CAD (s/p NSTEMI in 2004 with PTCA/stent to LAD). 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 .   Patient was last in clinic in June 2o14  No CP  Breatihng OK  Mows lawn. Only thing that bothers her is muscle strength in leg  Not good.  No real achiness Almost fell in water.   No Known Allergies  Current Outpatient Prescriptions  Medication Sig Dispense Refill  . alendronate (FOSAMAX) 70 MG tablet Take 1 tablet (70 mg total) by mouth every 7 (seven) days. Take with a full glass of water on an empty stomach.  4 tablet  11  . aspirin 81 MG tablet Take 81 mg by mouth daily.        . Calcium Carbonate-Vit D-Min (CALCIUM 1200 PO) 1 tab po qd       . Fish Oil OIL 2 tabs po qd       . Multiple Vitamin (MULTIVITAMIN) capsule Take 1 capsule by mouth daily.        . ramipril (ALTACE) 10 MG capsule 1 capsule by mouth daily--office visit due now  90 capsule  0  . rosuvastatin (CRESTOR) 20 MG tablet TAKE 1 TABLET BY MOUTH EVERY NIGHT AT BEDTIME  90 tablet  1   No current facility-administered medications for this visit.    Past Medical History  Diagnosis Date  . Hyperlipidemia   . Myocardial infarct hx of 2004  . HTN (hypertension)   . Goiter   . Fatty liver     Past Surgical History  Procedure Laterality Date  . Cardiac catheterization  2004    stent  . Sleep study  2012    Family History  Problem Relation Age of Onset  . Coronary artery disease    . Lung cancer    . Ovarian cancer    . Diabetes Maternal Grandmother   . Hypertension Mother   . Heart disease Mother 31    MI--- died from #3 at 86  . Hypertension Father   . Cancer Father 30    ovarian  . Hypertension Sister   . Hypertension Brother   . Cancer Sister 78    brain tumor    History   Social History   . Marital Status: Divorced    Spouse Name: N/A    Number of Children: N/A  . Years of Education: N/A   Occupational History  . nurse Other    [employer]    Social History Main Topics  . Smoking status: Former Smoker -- 1.00 packs/day for 30 years    Quit date: 07/17/2007  . Smokeless tobacco: Former Systems developer    Quit date: 06/01/2002  . Alcohol Use: 2.4 oz/week    4 Glasses of wine per week  . Drug Use: No  . Sexual Activity: Not Currently    Partners: Male   Other Topics Concern  . Not on file   Social History Narrative   Occupation:[employer]   Divorced   Former Smoker quit 2004   Drug use-no   Regular exercise-treadmill 3x a week    Review of Systems:  All systems reviewed.  They are negative to the above problem except as previously stated.  Vital Signs: BP 140/79  Pulse 64  Ht 5' 4.5" (1.638 m)  Wt 201 lb 3.2 oz (91.264 kg)  BMI 34.02 kg/m2  Physical Exam Patient is in NAD HEENT:  Normocephalic, atraumatic. EOMI, PERRLA.  Neck: JVP is normal.  No bruits.  Lungs: clear to auscultation. No rales no wheezes.  Heart: Regular rate and rhythm. Normal S1, S2. No S3.   No significant murmurs. PMI not displaced.  Abdomen:  Supple, nontender. Normal bowel sounds. No masses. No hepatomegaly.  Extremities:   Good distal pulses throughout. No lower extremity edema.  Musculoskeletal :moving all extremities.  Neuro:   alert and oriented x3.  CN II-XII grossly intact.  EKG  SR 64 Assessment and Plan:  1.  CAD  NO active symptoms  COntinue meds  2.  HL  Keep on statin  Will check lpids now that taking 2o  3.  HTN  Good control  4.  Weakness.  Not achiness.  I think it is important to objectify  Will refer to PT.    4.  Obesity  Encouraged her to increase activity  Continue with wt loss plans she is starting.  Check lipdis  Check [facility] PT referral F?U 1 year.

## 2013-11-27 ENCOUNTER — Ambulatory Visit (INDEPENDENT_AMBULATORY_CARE_PROVIDER_SITE_OTHER): Payer: Medicare Other | Admitting: Internal Medicine

## 2013-11-27 ENCOUNTER — Encounter: Payer: Self-pay | Admitting: Internal Medicine

## 2013-11-27 VITALS — BP 140/79 | HR 64 | Ht 64.5 in | Wt 201.2 lb

## 2013-11-27 DIAGNOSIS — E785 Hyperlipidemia, unspecified: Secondary | ICD-10-CM

## 2013-11-27 DIAGNOSIS — I10 Essential (primary) hypertension: Secondary | ICD-10-CM

## 2013-11-27 DIAGNOSIS — R531 Weakness: Secondary | ICD-10-CM

## 2013-11-27 DIAGNOSIS — R5381 Other malaise: Secondary | ICD-10-CM

## 2013-11-27 DIAGNOSIS — R5383 Other fatigue: Secondary | ICD-10-CM

## 2013-11-27 LAB — AST: AST: 25 U/L (ref 0–37)

## 2013-11-27 LAB — LIPID PANEL
Cholesterol: 141 mg/dL (ref 0–200)
HDL: 69.6 mg/dL (ref >39.00)
LDL Cholesterol: 59 mg/dL (ref 0–99)
NONHDL: 71.4
Total CHOL/HDL Ratio: 2
Triglycerides: 64 mg/dL (ref 0.0–149.0)
VLDL: 12.8 mg/dL (ref 0.0–40.0)

## 2013-11-27 LAB — CK: CK TOTAL: 138 U/L (ref 7–177)

## 2013-11-27 NOTE — Patient Instructions (Addendum)
Your physician wants you to follow-up in: 1 year with Dr. Harrington Challenger.   You will receive a reminder letter in the mail two months in advance. If you don't receive a letter, please call our office to schedule the follow-up appointment.  Your physician recommends that you continue on your current medications as directed. Please refer to the Current Medication list given to you today. Your physician recommends that you return for lab work in: TODAY (LIPIDS, AST, CK)  You have been referred to physical therapy for weakness.  The office will send a referral and physical therapy will be in touch with you.

## 2013-12-12 ENCOUNTER — Ambulatory Visit: Payer: Medicare Other | Attending: Internal Medicine | Admitting: Physical Therapy

## 2013-12-12 DIAGNOSIS — M6281 Muscle weakness (generalized): Secondary | ICD-10-CM | POA: Diagnosis present

## 2013-12-20 ENCOUNTER — Other Ambulatory Visit: Payer: Self-pay | Admitting: Family Medicine

## 2013-12-21 NOTE — Telephone Encounter (Signed)
Refill Request:  ramipril (ALTACE) 10 MG capsule--1 capsule by mouth daily  Last Filled:  09/22/13 Amt Filled:  90 tablets, 0 refills Last OV:  05/29/13 Lab: 05/30/13  Medication filled per Prescription Refill Protocol.  Left a message for call back.  Pt needs to be aware that refill has been filled and that an OV is due.

## 2014-01-03 ENCOUNTER — Encounter: Payer: Self-pay | Admitting: Family Medicine

## 2014-01-03 ENCOUNTER — Ambulatory Visit (INDEPENDENT_AMBULATORY_CARE_PROVIDER_SITE_OTHER): Payer: Medicare Other | Admitting: Family Medicine

## 2014-01-03 VITALS — BP 122/82 | HR 85 | Temp 98.0°F | Wt 205.0 lb

## 2014-01-03 DIAGNOSIS — Z23 Encounter for immunization: Secondary | ICD-10-CM

## 2014-01-03 DIAGNOSIS — E785 Hyperlipidemia, unspecified: Secondary | ICD-10-CM

## 2014-01-03 DIAGNOSIS — I10 Essential (primary) hypertension: Secondary | ICD-10-CM

## 2014-01-03 NOTE — Assessment & Plan Note (Signed)
Labs done in June with cardio---con't meds--rto Dec for cpe Lab Results  Component Value Date   CHOL 141 11/27/2013   CHOL 208* 05/30/2013   CHOL 219* 12/11/2011   Lab Results  Component Value Date   HDL 69.60 11/27/2013   HDL 73.30 05/30/2013   HDL 71.30 12/11/2011   Lab Results  Component Value Date   LDLCALC 59 11/27/2013   LDLCALC 114* 08/21/2011   LDLCALC 69 08/28/2010   Lab Results  Component Value Date   TRIG 64.0 11/27/2013   TRIG 70.0 05/30/2013   TRIG 104.0 12/11/2011   Lab Results  Component Value Date   CHOLHDL 2 11/27/2013   CHOLHDL 3 05/30/2013   CHOLHDL 3 12/11/2011   Lab Results  Component Value Date   LDLDIRECT 123.7 05/30/2013   LDLDIRECT 127.8 12/11/2011

## 2014-01-03 NOTE — Patient Instructions (Signed)

## 2014-01-03 NOTE — Progress Notes (Signed)
Pre visit review using our clinic review tool, if applicable. No additional management support is needed unless otherwise documented below in the visit note. 

## 2014-01-03 NOTE — Assessment & Plan Note (Addendum)
Stable con't meds Pt only needs to come in 1 x a year since cardiology check labs as well

## 2014-01-03 NOTE — Progress Notes (Signed)
   Subjective:    Patient ID: Tina [name], female    DOB: 1946/02/22, 68 y.o.   MRN: [id number]  HPI Pt here to f/u lipids.  Dr Harrington Challenger actually did it in June.  Her cpe is due in Dec.  No complaints   Review of Systems As above     Objective:   Physical Exam BP 122/82  Pulse 85  Temp(Src) 98 F (36.7 C) (Oral)  Wt 205 lb (92.987 kg)  SpO2 97% General appearance: alert, cooperative, appears stated age and no distress Lungs: clear to auscultation bilaterally Heart: S1, S2 normal Extremities: extremities normal, atraumatic, no cyanosis or edema       Assessment & Plan:

## 2014-01-04 ENCOUNTER — Telehealth: Payer: Self-pay | Admitting: Family Medicine

## 2014-01-04 NOTE — Telephone Encounter (Signed)
Relevant patient education assigned to patient using Emmi. ° °

## 2014-01-20 ENCOUNTER — Other Ambulatory Visit: Payer: Self-pay | Admitting: Family Medicine

## 2014-01-22 ENCOUNTER — Other Ambulatory Visit: Payer: Self-pay | Admitting: Family Medicine

## 2014-04-22 ENCOUNTER — Other Ambulatory Visit: Payer: Self-pay | Admitting: Family Medicine

## 2014-05-30 ENCOUNTER — Encounter: Payer: Medicare Other | Admitting: Family Medicine

## 2014-06-05 ENCOUNTER — Ambulatory Visit (INDEPENDENT_AMBULATORY_CARE_PROVIDER_SITE_OTHER): Payer: Medicare Other | Admitting: Family Medicine

## 2014-06-05 ENCOUNTER — Other Ambulatory Visit (HOSPITAL_COMMUNITY)
Admission: RE | Admit: 2014-06-05 | Discharge: 2014-06-05 | Disposition: A | Discharge status: Home / Self Care | Payer: Medicare Other | Source: Ambulatory Visit | Attending: Family Medicine | Admitting: Family Medicine

## 2014-06-05 ENCOUNTER — Encounter: Payer: Self-pay | Admitting: Family Medicine

## 2014-06-05 VITALS — BP 158/100 | HR 84 | Temp 97.4°F | Resp 18 | Ht 65.0 in | Wt 207.2 lb

## 2014-06-05 DIAGNOSIS — Z124 Encounter for screening for malignant neoplasm of cervix: Secondary | ICD-10-CM | POA: Insufficient documentation

## 2014-06-05 DIAGNOSIS — E785 Hyperlipidemia, unspecified: Secondary | ICD-10-CM | POA: Diagnosis not present

## 2014-06-05 DIAGNOSIS — R0789 Other chest pain: Secondary | ICD-10-CM | POA: Diagnosis not present

## 2014-06-05 DIAGNOSIS — Z0000 Encounter for general adult medical examination without abnormal findings: Secondary | ICD-10-CM

## 2014-06-05 DIAGNOSIS — Z23 Encounter for immunization: Secondary | ICD-10-CM | POA: Diagnosis not present

## 2014-06-05 DIAGNOSIS — M79602 Pain in left arm: Secondary | ICD-10-CM

## 2014-06-05 DIAGNOSIS — I10 Essential (primary) hypertension: Secondary | ICD-10-CM | POA: Diagnosis not present

## 2014-06-05 DIAGNOSIS — Z1151 Encounter for screening for human papillomavirus (HPV): Secondary | ICD-10-CM | POA: Diagnosis not present

## 2014-06-05 LAB — HEPATIC FUNCTION PANEL
ALT: 17 U/L (ref 0–35)
AST: 22 U/L (ref 0–37)
Albumin: 4.5 g/dL (ref 3.5–5.2)
Alkaline Phosphatase: 52 U/L (ref 39–117)
BILIRUBIN TOTAL: 0.6 mg/dL (ref 0.2–1.2)
Bilirubin, Direct: 0.1 mg/dL (ref 0.0–0.3)
Total Protein: 7.7 g/dL (ref 6.0–8.3)

## 2014-06-05 LAB — CBC WITH DIFFERENTIAL/PLATELET
BASOS ABS: 0 10*3/uL (ref 0.0–0.1)
Basophils Relative: 0.3 % (ref 0.0–3.0)
EOS ABS: 0.1 10*3/uL (ref 0.0–0.7)
Eosinophils Relative: 0.7 % (ref 0.0–5.0)
HCT: 41.8 % (ref 36.0–46.0)
HEMOGLOBIN: 14.1 g/dL (ref 12.0–15.0)
LYMPHS PCT: 28 % (ref 12.0–46.0)
Lymphs Abs: 2.1 10*3/uL (ref 0.7–4.0)
MCHC: 33.6 g/dL (ref 30.0–36.0)
MCV: 89.3 fl (ref 78.0–100.0)
Monocytes Absolute: 0.4 10*3/uL (ref 0.1–1.0)
Monocytes Relative: 5.3 % (ref 3.0–12.0)
NEUTROS ABS: 5 10*3/uL (ref 1.4–7.7)
Neutrophils Relative %: 65.7 % (ref 43.0–77.0)
PLATELETS: 262 10*3/uL (ref 150.0–400.0)
RBC: 4.69 Mil/uL (ref 3.87–5.11)
RDW: 12.9 % (ref 11.5–15.5)
WBC: 7.6 10*3/uL (ref 4.0–10.5)

## 2014-06-05 LAB — BASIC METABOLIC PANEL
BUN: 21 mg/dL (ref 6–23)
CALCIUM: 9.7 mg/dL (ref 8.4–10.5)
CO2: 26 meq/L (ref 19–32)
Chloride: 105 mEq/L (ref 96–112)
Creatinine, Ser: 0.7 mg/dL (ref 0.4–1.2)
GFR: 96.56 mL/min (ref >60.00)
GLUCOSE: 97 mg/dL (ref 70–99)
Potassium: 4 mEq/L (ref 3.5–5.1)
Sodium: 142 mEq/L (ref 135–145)

## 2014-06-05 LAB — POCT URINALYSIS DIPSTICK
Bilirubin, UA: NEGATIVE
Glucose, UA: NEGATIVE
Ketones, UA: NEGATIVE
Leukocytes, UA: NEGATIVE
Nitrite, UA: NEGATIVE
PROTEIN UA: NEGATIVE
RBC UA: NEGATIVE
Spec Grav, UA: >=1.03
UROBILINOGEN UA: NEGATIVE
pH, UA: 6

## 2014-06-05 LAB — TSH: TSH: 1.85 u[IU]/mL (ref 0.35–4.50)

## 2014-06-05 LAB — LIPID PANEL
CHOL/HDL RATIO: 2
Cholesterol: 206 mg/dL — ABNORMAL HIGH (ref 0–200)
HDL: 82.9 mg/dL (ref >39.00)
LDL CALC: 101 mg/dL — AB (ref 0–99)
NonHDL: 123.1
TRIGLYCERIDES: 112 mg/dL (ref 0.0–149.0)
VLDL: 22.4 mg/dL (ref 0.0–40.0)

## 2014-06-05 NOTE — Progress Notes (Signed)
Pre visit review using our clinic review tool, if applicable. No additional management support is needed unless otherwise documented below in the visit note. 

## 2014-06-05 NOTE — Progress Notes (Signed)
Subjective:    Tina [name] is a 69 y.o. female who presents for Medicare Annual/Subsequent preventive examination.  Preventive Screening-Counseling & Management  Tobacco History  Smoking status  . Former Smoker -- 1.00 packs/day for 30 years  . Quit date: 07/17/2007  Smokeless tobacco  . Former Systems developer  . Quit date: 06/01/2002     Problems Prior to Visit 1. L arm pain and numbess in arm and hand.  --- still has occassional palpitations but has discussed this with Dr Harrington Challenger.  No chest pain , or sob  Current Problems (verified) Patient Active Problem List   Diagnosis Date Noted  . Obesity (BMI 30-39.9) 05/29/2013  . Otitis externa 09/30/2010  . CONJUNCTIVITIS, BACTERIAL 12/16/2009  . SINUSITIS- ACUTE-NOS 08/01/2009  . TOBACCO ABUSE, HX OF 07/22/2009  . OTITIS EXTERNA, ACUTE, RIGHT 02/08/2009  . GOITER NOS 02/10/2007  . HYPERLIPIDEMIA 02/10/2007  . HYPERTENSION 02/10/2007  . MYOCARDIAL INFARCTION, HX OF 02/10/2007  . DIZZINESS 02/10/2007  . EDEMA LEG 02/10/2007  . FATTY LIVER DISEASE, HX OF 02/10/2007    Medications Prior to Visit Current Outpatient Prescriptions on File Prior to Visit  Medication Sig Dispense Refill  . alendronate (FOSAMAX) 70 MG tablet Take 1 tablet (70 mg total) by mouth every 7 (seven) days. Take with a full glass of water on an empty stomach. 4 tablet 11  . aspirin 81 MG tablet Take 81 mg by mouth daily.      . Calcium Carbonate-Vit D-Min (CALCIUM 1200 PO) 1 tab po qd     . Fish Oil OIL 2 tabs po qd     . Multiple Vitamin (MULTIVITAMIN) capsule Take 1 capsule by mouth daily.      . ramipril (ALTACE) 10 MG capsule TAKE ONE CAPSULE BY MOUTH DAILY 30 capsule 2  . rosuvastatin (CRESTOR) 20 MG tablet Take 1 tablet (20 mg total) by mouth daily. Repeat labs are due now 30 tablet 0   No current facility-administered medications on file prior to visit.    Current Medications (verified) Current Outpatient Prescriptions  Medication Sig Dispense Refill  .  alendronate (FOSAMAX) 70 MG tablet Take 1 tablet (70 mg total) by mouth every 7 (seven) days. Take with a full glass of water on an empty stomach. 4 tablet 11  . aspirin 81 MG tablet Take 81 mg by mouth daily.      . Calcium Carbonate-Vit D-Min (CALCIUM 1200 PO) 1 tab po qd     . Fish Oil OIL 2 tabs po qd     . Multiple Vitamin (MULTIVITAMIN) capsule Take 1 capsule by mouth daily.      . ramipril (ALTACE) 10 MG capsule TAKE ONE CAPSULE BY MOUTH DAILY 30 capsule 2  . rosuvastatin (CRESTOR) 20 MG tablet Take 1 tablet (20 mg total) by mouth daily. Repeat labs are due now 30 tablet 0   No current facility-administered medications for this visit.     Allergies (verified) Review of patient's allergies indicates no known allergies.   PAST HISTORY  Family History Family History  Problem Relation Age of Onset  . Coronary artery disease    . Lung cancer    . Ovarian cancer    . Diabetes Maternal Grandmother   . Hypertension Mother   . Heart disease Mother 51    MI--- died from #3 at 65  . Hypertension Father   . Cancer Father 59    ovarian  . Hypertension Sister   . Hypertension Brother   . Cancer  Sister 22    brain tumor    Social History History  Substance Use Topics  . Smoking status: Former Smoker -- 1.00 packs/day for 30 years    Quit date: 07/17/2007  . Smokeless tobacco: Former Systems developer    Quit date: 06/01/2002  . Alcohol Use: 2.4 oz/week    4 Glasses of wine per week     Are there smokers in your home (other than you)? No  Risk Factors Current exercise habits: The patient does not participate in regular exercise at present.  Dietary issues discussed: na   Cardiac risk factors: advanced age (older than 35 for men, 33 for women), dyslipidemia, hypertension, obesity (BMI >= 30 kg/m2) and sedentary lifestyle.  Depression Screen (Note: if answer to either of the following is "Yes", a more complete depression screening is indicated)   Over the past two weeks, have you felt  down, depressed or hopeless? No  Over the past two weeks, have you felt little interest or pleasure in doing things? No  Have you lost interest or pleasure in daily life? No  Do you often feel hopeless? No  Do you cry easily over simple problems? No  Activities of Daily Living In your present state of health, do you have any difficulty performing the following activities?:  Driving? No Managing money?  No Feeding yourself? No Getting from bed to chair? No Climbing a flight of stairs? No Preparing food and eating?: No Bathing or showering? No Getting dressed: No Getting to the toilet? No Using the toilet:No Moving around from place to place: No In the past year have you fallen or had a near fall?:No   Are you sexually active?  No  Do you have more than one partner?  No  Hearing Difficulties: No Do you often ask people to speak up or repeat themselves? No Do you experience ringing or noises in your ears? No Do you have difficulty understanding soft or whispered voices? No   Do you feel that you have a problem with memory? No  Do you often misplace items? No  Do you feel safe at home?  Yes  Cognitive Testing  Alert? Yes  Normal Appearance?Yes  Oriented to person? Yes  Place? yes Time? Yes  Recall of three objects?  Yes  Can perform simple calculations? Yes  Displays appropriate judgment?Yes  Can read the correct time from a watch face?Yes   Advanced Directives have been discussed with the patient? Yes  List the Names of Other Physician/Practitioners you currently use: 1.  [facility]-- eyes 2.  Dentist---[employer] 3.  Cardio-- [employer] any recent Medical Services you may have received from other than [facility] providers in the past year (date may be approximate).  Immunization History  Administered Date(s) Administered  . Pneumococcal Polysaccharide-23 01/03/2014  . Tdap 10/14/2010  . Zoster 10/14/2010    Screening Tests Health Maintenance  Topic Date Due  .  COLONOSCOPY  03/02/1997  . INFLUENZA VACCINE  12/30/2013  . MAMMOGRAM  06/24/2015  . TETANUS/TDAP  10/13/2020  . DEXA SCAN  Completed  . PNEUMOCOCCAL POLYSACCHARIDE VACCINE AGE 76 AND OVER  Completed  . ZOSTAVAX  Completed    All answers were reviewed with the patient and necessary referrals were made:  Garnet Koyanagi, DO   06/05/2014   History reviewed:  She  has a past medical history of Hyperlipidemia; Myocardial infarct (hx of 2004); HTN (hypertension); Goiter; and Fatty liver. She  does not have any pertinent problems on file. She  has past surgical history that includes Cardiac catheterization (2004) and sleep study (2012). Her family history includes Cancer (age of onset: 46) in her sister; Cancer (age of onset: 38) in her father; Coronary artery disease in an other family member; Diabetes in her maternal grandmother; Heart disease (age of onset: 53) in her mother; Hypertension in her brother, father, mother, and sister; Lung cancer in an other family member; Ovarian cancer in an other family member. She  reports that she quit smoking about 6 years ago. She quit smokeless tobacco use about 12 years ago. She reports that she drinks about 2.4 oz of alcohol per week. She reports that she does not use illicit drugs. She has a current medication list which includes the following prescription(s): alendronate, aspirin, calcium carbonate-vit d-min, fish oil, multivitamin, ramipril, and rosuvastatin. Current Outpatient Prescriptions on File Prior to Visit  Medication Sig Dispense Refill  . alendronate (FOSAMAX) 70 MG tablet Take 1 tablet (70 mg total) by mouth every 7 (seven) days. Take with a full glass of water on an empty stomach. 4 tablet 11  . aspirin 81 MG tablet Take 81 mg by mouth daily.      . Calcium Carbonate-Vit D-Min (CALCIUM 1200 PO) 1 tab po qd     . Fish Oil OIL 2 tabs po qd     . Multiple Vitamin (MULTIVITAMIN) capsule Take 1 capsule by mouth daily.      . ramipril (ALTACE) 10  MG capsule TAKE ONE CAPSULE BY MOUTH DAILY 30 capsule 2  . rosuvastatin (CRESTOR) 20 MG tablet Take 1 tablet (20 mg total) by mouth daily. Repeat labs are due now 30 tablet 0   No current facility-administered medications on file prior to visit.   She has No Known Allergies.  Review of Systems  Review of Systems  Constitutional: Negative for activity change, appetite change and fatigue.  HENT: Negative for hearing loss, congestion, tinnitus and ear discharge.   Eyes: Negative for visual disturbance (see optho q1y -- vision corrected to 20/20 with glasses).  Respiratory: Negative for cough, chest tightness and shortness of breath.   Cardiovascular: +, palpitations and chest pain Gastrointestinal: Negative for abdominal pain, diarrhea, constipation and abdominal distention.  Genitourinary: Negative for urgency, frequency, decreased urine volume and difficulty urinating.  Musculoskeletal: Negative for back pain, arthralgias and gait problem.  Skin: Negative for color change, pallor and rash.  Neurological: Negative for dizziness, light-headedness, numbness and headaches.  Hematological: Negative for adenopathy. Does not bruise/bleed easily.  Psychiatric/Behavioral: Negative for suicidal ideas, confusion, sleep disturbance, self-injury, dysphoric mood, decreased concentration and agitation.  Pt is able to read and write and can do all ADLs No risk for falling No abuse/ violence in home      Objective:     Vision by Snellen chart: opth  Body mass index is 34.48 kg/(m^2). BP 158/100 mmHg  Pulse 84  Temp(Src) 97.4 F (36.3 C) (Oral)  Resp 18  Ht 5\' 5"  (1.651 m)  Wt 207 lb 3.2 oz (93.985 kg)  BMI 34.48 kg/m2  SpO2 98%  BP 158/100 mmHg  Pulse 84  Temp(Src) 97.4 F (36.3 C) (Oral)  Resp 18  Ht 5\' 5"  (1.651 m)  Wt 207 lb 3.2 oz (93.985 kg)  BMI 34.48 kg/m2  SpO2 98% General appearance: alert, cooperative, appears stated age and no distress Head: Normocephalic, without  obvious abnormality, atraumatic Eyes: conjunctivae/corneas clear. PERRL, EOM's intact. Fundi benign. Ears: normal TM's and external ear canals both ears Nose: Nares normal.  Septum midline. Mucosa normal. No drainage or sinus tenderness. Throat: lips, mucosa, and tongue normal; teeth and gums normal Neck: no adenopathy, no carotid bruit, no JVD, supple, symmetrical, trachea midline and thyroid not enlarged, symmetric, no tenderness/mass/nodules Back: symmetric, no curvature. ROM normal. No CVA tenderness. Lungs: clear to auscultation bilaterally Breasts: normal appearance, no masses or tenderness Heart: S1, S2 normal Abdomen: soft, non-tender; bowel sounds normal; no masses,  no organomegaly Pelvic: cervix normal in appearance, external genitalia normal, no adnexal masses or tenderness, no cervical motion tenderness, rectovaginal septum normal, uterus normal size, shape, and consistency, vagina normal without discharge and pap done-- rectal-- heme neg brown stool Extremities: extremities normal, atraumatic, no cyanosis or edema Pulses: 2+ and symmetric Skin: Skin color, texture, turgor normal. No rashes or lesions Lymph nodes: Cervical, supraclavicular, and axillary nodes normal. Neurologic: Alert and oriented X 3, normal strength and tone. Normal symmetric reflexes. Normal coordination and gait--- numbness and tingling with ache in L arm / elbow/ forearm- no pain with palpation Psych- no depression, no anxiety      Assessment:     cpe      Plan:     During the course of the visit the patient was educated and counseled about appropriate screening and preventive services including:    Pneumococcal vaccine   Influenza vaccine  Screening mammography  Screening Pap smear and pelvic exam   Bone densitometry screening  Glaucoma screening Advanced directives: has NO advanced directive - not interested in additional information  Pt refusing colon--- will do cologuard Diet review  for nutrition referral? Yes ____  Not Indicated __x__   Patient Instructions (the written plan) was given to the patient.  Medicare Attestation I have personally reviewed: The patient's medical and social history Their use of alcohol, tobacco or illicit drugs Their current medications and supplements The patient's functional ability including ADLs,fall risks, home safety risks, cognitive, and hearing and visual impairment Diet and physical activities Evidence for depression or mood disorders  The patient's weight, height, BMI, and visual acuity have been recorded in the chart.  I have made referrals, counseling, and provided education to the patient based on review of the above and I have provided the patient with a written personalized care plan for preventive services.    1. Left arm pain-- refer to ortho ? Ulnar radiculopathy - EKG 12-Lead---NSR  2. Preventative health care   3. Essential hypertension con't con't altace,  Running normal at home,  Pt has not taken meds today - Basic metabolic panel - CBC with Differential - Hepatic function panel - Lipid panel - POCT urinalysis dipstick - TSH  4. Hyperlipidemia LDL goal <100 Cont crestor - Hepatic function panel - Lipid panel - POCT urinalysis dipstick - TSH  5. Other chest pain ekg   Garnet Koyanagi, DO   06/05/2014

## 2014-06-05 NOTE — Patient Instructions (Signed)
Preventive Care for Adults A healthy lifestyle and preventive care can promote health and wellness. Preventive health guidelines for women include the following key practices.  A routine yearly physical is a good way to check with your health care provider about your health and preventive screening. It is a chance to share any concerns and updates on your health and to receive a thorough exam.  Visit your dentist for a routine exam and preventive care every 6 months. Brush your teeth twice a day and floss once a day. Good oral hygiene prevents tooth decay and gum disease.  The frequency of eye exams is based on your age, health, family medical history, use of contact lenses, and other factors. Follow your health care provider's recommendations for frequency of eye exams.  Eat a healthy diet. Foods like vegetables, fruits, whole grains, low-fat dairy products, and lean protein foods contain the nutrients you need without too many calories. Decrease your intake of foods high in solid fats, added sugars, and salt. Eat the right amount of calories for you.Get information about a proper diet from your health care provider, if necessary.  Regular physical exercise is one of the most important things you can do for your health. Most adults should get at least 150 minutes of moderate-intensity exercise (any activity that increases your heart rate and causes you to sweat) each week. In addition, most adults need muscle-strengthening exercises on 2 or more days a week.  Maintain a healthy weight. The body mass index (BMI) is a screening tool to identify possible weight problems. It provides an estimate of body fat based on height and weight. Your health care provider can find your BMI and can help you achieve or maintain a healthy weight.For adults 20 years and older:  A BMI below 18.5 is considered underweight.  A BMI of 18.5 to 24.9 is normal.  A BMI of 25 to 29.9 is considered overweight.  A BMI of  30 and above is considered obese.  Maintain normal blood lipids and cholesterol levels by exercising and minimizing your intake of saturated fat. Eat a balanced diet with plenty of fruit and vegetables. Blood tests for lipids and cholesterol should begin at age 76 and be repeated every 5 years. If your lipid or cholesterol levels are high, you are over 50, or you are at high risk for heart disease, you may need your cholesterol levels checked more frequently.Ongoing high lipid and cholesterol levels should be treated with medicines if diet and exercise are not working.  If you smoke, find out from your health care provider how to quit. If you do not use tobacco, do not start.  Lung cancer screening is recommended for adults aged 22-80 years who are at high risk for developing lung cancer because of a history of smoking. A yearly low-dose CT scan of the lungs is recommended for people who have at least a 30-pack-year history of smoking and are a current smoker or have quit within the past 15 years. A pack year of smoking is smoking an average of 1 pack of cigarettes a day for 1 year (for example: 1 pack a day for 30 years or 2 packs a day for 15 years). Yearly screening should continue until the smoker has stopped smoking for at least 15 years. Yearly screening should be stopped for people who develop a health problem that would prevent them from having lung cancer treatment.  If you are pregnant, do not drink alcohol. If you are breastfeeding,  be very cautious about drinking alcohol. If you are not pregnant and choose to drink alcohol, do not have more than 1 drink per day. One drink is considered to be 12 ounces (355 mL) of beer, 5 ounces (148 mL) of wine, or 1.5 ounces (44 mL) of liquor.  Avoid use of street drugs. Do not share needles with anyone. Ask for help if you need support or instructions about stopping the use of drugs.  High blood pressure causes heart disease and increases the risk of  stroke. Your blood pressure should be checked at least every 1 to 2 years. Ongoing high blood pressure should be treated with medicines if weight loss and exercise do not work.  If you are 75-52 years old, ask your health care provider if you should take aspirin to prevent strokes.  Diabetes screening involves taking a blood sample to check your fasting blood sugar level. This should be done once every 3 years, after age 15, if you are within normal weight and without risk factors for diabetes. Testing should be considered at a younger age or be carried out more frequently if you are overweight and have at least 1 risk factor for diabetes.  Breast cancer screening is essential preventive care for women. You should practice "breast self-awareness." This means understanding the normal appearance and feel of your breasts and may include breast self-examination. Any changes detected, no matter how small, should be reported to a health care provider. Women in their 58s and 30s should have a clinical breast exam (CBE) by a health care provider as part of a regular health exam every 1 to 3 years. After age 16, women should have a CBE every year. Starting at age 53, women should consider having a mammogram (breast X-ray test) every year. Women who have a family history of breast cancer should talk to their health care provider about genetic screening. Women at a high risk of breast cancer should talk to their health care providers about having an MRI and a mammogram every year.  Breast cancer gene (BRCA)-related cancer risk assessment is recommended for women who have family members with BRCA-related cancers. BRCA-related cancers include breast, ovarian, tubal, and peritoneal cancers. Having family members with these cancers may be associated with an increased risk for harmful changes (mutations) in the breast cancer genes BRCA1 and BRCA2. Results of the assessment will determine the need for genetic counseling and  BRCA1 and BRCA2 testing.  Routine pelvic exams to screen for cancer are no longer recommended for nonpregnant women who are considered low risk for cancer of the pelvic organs (ovaries, uterus, and vagina) and who do not have symptoms. Ask your health care provider if a screening pelvic exam is right for you.  If you have had past treatment for cervical cancer or a condition that could lead to cancer, you need Pap tests and screening for cancer for at least 20 years after your treatment. If Pap tests have been discontinued, your risk factors (such as having a new sexual partner) need to be reassessed to determine if screening should be resumed. Some women have medical problems that increase the chance of getting cervical cancer. In these cases, your health care provider may recommend more frequent screening and Pap tests.  The HPV test is an additional test that may be used for cervical cancer screening. The HPV test looks for the virus that can cause the cell changes on the cervix. The cells collected during the Pap test can be  tested for HPV. The HPV test could be used to screen women aged 30 years and older, and should be used in women of any age who have unclear Pap test results. After the age of 30, women should have HPV testing at the same frequency as a Pap test.  Colorectal cancer can be detected and often prevented. Most routine colorectal cancer screening begins at the age of 50 years and continues through age 75 years. However, your health care provider may recommend screening at an earlier age if you have risk factors for colon cancer. On a yearly basis, your health care provider may provide home test kits to check for hidden blood in the stool. Use of a small camera at the end of a tube, to directly examine the colon (sigmoidoscopy or colonoscopy), can detect the earliest forms of colorectal cancer. Talk to your health care provider about this at age 50, when routine screening begins. Direct  exam of the colon should be repeated every 5-10 years through age 75 years, unless early forms of pre-cancerous polyps or small growths are found.  People who are at an increased risk for hepatitis B should be screened for this virus. You are considered at high risk for hepatitis B if:  You were born in a country where hepatitis B occurs often. Talk with your health care provider about which countries are considered high risk.  Your parents were born in a high-risk country and you have not received a shot to protect against hepatitis B (hepatitis B vaccine).  You have HIV or AIDS.  You use needles to inject street drugs.  You live with, or have sex with, someone who has hepatitis B.  You get hemodialysis treatment.  You take certain medicines for conditions like cancer, organ transplantation, and autoimmune conditions.  Hepatitis C blood testing is recommended for all people born from 1945 through 1965 and any individual with known risks for hepatitis C.  Practice safe sex. Use condoms and avoid high-risk sexual practices to reduce the spread of sexually transmitted infections (STIs). STIs include gonorrhea, chlamydia, syphilis, trichomonas, herpes, HPV, and human immunodeficiency virus (HIV). Herpes, HIV, and HPV are viral illnesses that have no cure. They can result in disability, cancer, and death.  You should be screened for sexually transmitted illnesses (STIs) including gonorrhea and chlamydia if:  You are sexually active and are younger than 24 years.  You are older than 24 years and your health care provider tells you that you are at risk for this type of infection.  Your sexual activity has changed since you were last screened and you are at an increased risk for chlamydia or gonorrhea. Ask your health care provider if you are at risk.  If you are at risk of being infected with HIV, it is recommended that you take a prescription medicine daily to prevent HIV infection. This is  called preexposure prophylaxis (PrEP). You are considered at risk if:  You are a heterosexual woman, are sexually active, and are at increased risk for HIV infection.  You take drugs by injection.  You are sexually active with a partner who has HIV.  Talk with your health care provider about whether you are at high risk of being infected with HIV. If you choose to begin PrEP, you should first be tested for HIV. You should then be tested every 3 months for as long as you are taking PrEP.  Osteoporosis is a disease in which the bones lose minerals and strength   with aging. This can result in serious bone fractures or breaks. The risk of osteoporosis can be identified using a bone density scan. Women ages 65 years and over and women at risk for fractures or osteoporosis should discuss screening with their health care providers. Ask your health care provider whether you should take a calcium supplement or vitamin D to reduce the rate of osteoporosis.  Menopause can be associated with physical symptoms and risks. Hormone replacement therapy is available to decrease symptoms and risks. You should talk to your health care provider about whether hormone replacement therapy is right for you.  Use sunscreen. Apply sunscreen liberally and repeatedly throughout the day. You should seek shade when your shadow is shorter than you. Protect yourself by wearing long sleeves, pants, a wide-brimmed hat, and sunglasses year round, whenever you are outdoors.  Once a month, do a whole body skin exam, using a mirror to look at the skin on your back. Tell your health care provider of new moles, moles that have irregular borders, moles that are larger than a pencil eraser, or moles that have changed in shape or color.  Stay current with required vaccines (immunizations).  Influenza vaccine. All adults should be immunized every year.  Tetanus, diphtheria, and acellular pertussis (Td, Tdap) vaccine. Pregnant women should  receive 1 dose of Tdap vaccine during each pregnancy. The dose should be obtained regardless of the length of time since the last dose. Immunization is preferred during the 27th-36th week of gestation. An adult who has not previously received Tdap or who does not know her vaccine status should receive 1 dose of Tdap. This initial dose should be followed by tetanus and diphtheria toxoids (Td) booster doses every 10 years. Adults with an unknown or incomplete history of completing a 3-dose immunization series with Td-containing vaccines should begin or complete a primary immunization series including a Tdap dose. Adults should receive a Td booster every 10 years.  Varicella vaccine. An adult without evidence of immunity to varicella should receive 2 doses or a second dose if she has previously received 1 dose. Pregnant females who do not have evidence of immunity should receive the first dose after pregnancy. This first dose should be obtained before leaving the health care facility. The second dose should be obtained 4-8 weeks after the first dose.  Human papillomavirus (HPV) vaccine. Females aged 13-26 years who have not received the vaccine previously should obtain the 3-dose series. The vaccine is not recommended for use in pregnant females. However, pregnancy testing is not needed before receiving a dose. If a female is found to be pregnant after receiving a dose, no treatment is needed. In that case, the remaining doses should be delayed until after the pregnancy. Immunization is recommended for any person with an immunocompromised condition through the age of 26 years if she did not get any or all doses earlier. During the 3-dose series, the second dose should be obtained 4-8 weeks after the first dose. The third dose should be obtained 24 weeks after the first dose and 16 weeks after the second dose.  Zoster vaccine. One dose is recommended for adults aged 60 years or older unless certain conditions are  present.  Measles, mumps, and rubella (MMR) vaccine. Adults born before 1957 generally are considered immune to measles and mumps. Adults born in 1957 or later should have 1 or more doses of MMR vaccine unless there is a contraindication to the vaccine or there is laboratory evidence of immunity to   each of the three diseases. A routine second dose of MMR vaccine should be obtained at least 28 days after the first dose for students attending postsecondary schools, health care workers, or international travelers. People who received inactivated measles vaccine or an unknown type of measles vaccine during 1963-1967 should receive 2 doses of MMR vaccine. People who received inactivated mumps vaccine or an unknown type of mumps vaccine before 1979 and are at high risk for mumps infection should consider immunization with 2 doses of MMR vaccine. For females of childbearing age, rubella immunity should be determined. If there is no evidence of immunity, females who are not pregnant should be vaccinated. If there is no evidence of immunity, females who are pregnant should delay immunization until after pregnancy. Unvaccinated health care workers born before 1957 who lack laboratory evidence of measles, mumps, or rubella immunity or laboratory confirmation of disease should consider measles and mumps immunization with 2 doses of MMR vaccine or rubella immunization with 1 dose of MMR vaccine.  Pneumococcal 13-valent conjugate (PCV13) vaccine. When indicated, a person who is uncertain of her immunization history and has no record of immunization should receive the PCV13 vaccine. An adult aged 19 years or older who has certain medical conditions and has not been previously immunized should receive 1 dose of PCV13 vaccine. This PCV13 should be followed with a dose of pneumococcal polysaccharide (PPSV23) vaccine. The PPSV23 vaccine dose should be obtained at least 8 weeks after the dose of PCV13 vaccine. An adult aged 19  years or older who has certain medical conditions and previously received 1 or more doses of PPSV23 vaccine should receive 1 dose of PCV13. The PCV13 vaccine dose should be obtained 1 or more years after the last PPSV23 vaccine dose.  Pneumococcal polysaccharide (PPSV23) vaccine. When PCV13 is also indicated, PCV13 should be obtained first. All adults aged 65 years and older should be immunized. An adult younger than age 65 years who has certain medical conditions should be immunized. Any person who resides in a nursing home or long-term care facility should be immunized. An adult smoker should be immunized. People with an immunocompromised condition and certain other conditions should receive both PCV13 and PPSV23 vaccines. People with human immunodeficiency virus (HIV) infection should be immunized as soon as possible after diagnosis. Immunization during chemotherapy or radiation therapy should be avoided. Routine use of PPSV23 vaccine is not recommended for American Indians, [place] Natives, or people younger than 65 years unless there are medical conditions that require PPSV23 vaccine. When indicated, people who have unknown immunization and have no record of immunization should receive PPSV23 vaccine. One-time revaccination 5 years after the first dose of PPSV23 is recommended for people aged 19-64 years who have chronic kidney failure, nephrotic syndrome, asplenia, or immunocompromised conditions. People who received 1-2 doses of PPSV23 before age 65 years should receive another dose of PPSV23 vaccine at age 65 years or later if at least 5 years have passed since the previous dose. Doses of PPSV23 are not needed for people immunized with PPSV23 at or after age 65 years.  Meningococcal vaccine. Adults with asplenia or persistent complement component deficiencies should receive 2 doses of quadrivalent meningococcal conjugate (MenACWY-D) vaccine. The doses should be obtained at least 2 months apart.  Microbiologists working with certain meningococcal bacteria, military recruits, people at risk during an outbreak, and people who travel to or live in countries with a high rate of meningitis should be immunized. A first-year college student up through age   21 years who is living in a residence hall should receive a dose if she did not receive a dose on or after her 16th birthday. Adults who have certain high-risk conditions should receive one or more doses of vaccine.  Hepatitis A vaccine. Adults who wish to be protected from this disease, have certain high-risk conditions, work with hepatitis A-infected animals, work in hepatitis A research labs, or travel to or work in countries with a high rate of hepatitis A should be immunized. Adults who were previously unvaccinated and who anticipate close contact with an international adoptee during the first 60 days after arrival in the [place] from a country with a high rate of hepatitis A should be immunized.  Hepatitis B vaccine. Adults who wish to be protected from this disease, have certain high-risk conditions, may be exposed to blood or other infectious body fluids, are household contacts or sex partners of hepatitis B positive people, are clients or workers in certain care facilities, or travel to or work in countries with a high rate of hepatitis B should be immunized.  Haemophilus influenzae type b (Hib) vaccine. A previously unvaccinated person with asplenia or sickle cell disease or having a scheduled splenectomy should receive 1 dose of Hib vaccine. Regardless of previous immunization, a recipient of a hematopoietic stem cell transplant should receive a 3-dose series 6-12 months after her successful transplant. Hib vaccine is not recommended for adults with HIV infection. Preventive Services / Frequency Ages 64 to 68 years  Blood pressure check.** / Every 1 to 2 years.  Lipid and cholesterol check.** / Every 5 years beginning at age  22.  Clinical breast exam.** / Every 3 years for women in their 88s and 53s.  BRCA-related cancer risk assessment.** / For women who have family members with a BRCA-related cancer (breast, ovarian, tubal, or peritoneal cancers).  Pap test.** / Every 2 years from ages 90 through 51. Every 3 years starting at age 21 through age 56 or 3 with a history of 3 consecutive normal Pap tests.  HPV screening.** / Every 3 years from ages 24 through ages 1 to 46 with a history of 3 consecutive normal Pap tests.  Hepatitis C blood test.** / For any individual with known risks for hepatitis C.  Skin self-exam. / Monthly.  Influenza vaccine. / Every year.  Tetanus, diphtheria, and acellular pertussis (Tdap, Td) vaccine.** / Consult your health care provider. Pregnant women should receive 1 dose of Tdap vaccine during each pregnancy. 1 dose of Td every 10 years.  Varicella vaccine.** / Consult your health care provider. Pregnant females who do not have evidence of immunity should receive the first dose after pregnancy.  HPV vaccine. / 3 doses over 6 months, if 72 and younger. The vaccine is not recommended for use in pregnant females. However, pregnancy testing is not needed before receiving a dose.  Measles, mumps, rubella (MMR) vaccine.** / You need at least 1 dose of MMR if you were born in 1957 or later. You may also need a 2nd dose. For females of childbearing age, rubella immunity should be determined. If there is no evidence of immunity, females who are not pregnant should be vaccinated. If there is no evidence of immunity, females who are pregnant should delay immunization until after pregnancy.  Pneumococcal 13-valent conjugate (PCV13) vaccine.** / Consult your health care provider.  Pneumococcal polysaccharide (PPSV23) vaccine.** / 1 to 2 doses if you smoke cigarettes or if you have certain conditions.  Meningococcal vaccine.** /  1 dose if you are age 19 to 21 years and a first-year college  student living in a residence hall, or have one of several medical conditions, you need to get vaccinated against meningococcal disease. You may also need additional booster doses.  Hepatitis A vaccine.** / Consult your health care provider.  Hepatitis B vaccine.** / Consult your health care provider.  Haemophilus influenzae type b (Hib) vaccine.** / Consult your health care provider. Ages 40 to 64 years  Blood pressure check.** / Every 1 to 2 years.  Lipid and cholesterol check.** / Every 5 years beginning at age 20 years.  Lung cancer screening. / Every year if you are aged 55-80 years and have a 30-pack-year history of smoking and currently smoke or have quit within the past 15 years. Yearly screening is stopped once you have quit smoking for at least 15 years or develop a health problem that would prevent you from having lung cancer treatment.  Clinical breast exam.** / Every year after age 40 years.  BRCA-related cancer risk assessment.** / For women who have family members with a BRCA-related cancer (breast, ovarian, tubal, or peritoneal cancers).  Mammogram.** / Every year beginning at age 40 years and continuing for as long as you are in good health. Consult with your health care provider.  Pap test.** / Every 3 years starting at age 30 years through age 65 or 70 years with a history of 3 consecutive normal Pap tests.  HPV screening.** / Every 3 years from ages 30 years through ages 65 to 70 years with a history of 3 consecutive normal Pap tests.  Fecal occult blood test (FOBT) of stool. / Every year beginning at age 50 years and continuing until age 75 years. You may not need to do this test if you get a colonoscopy every 10 years.  Flexible sigmoidoscopy or colonoscopy.** / Every 5 years for a flexible sigmoidoscopy or every 10 years for a colonoscopy beginning at age 50 years and continuing until age 75 years.  Hepatitis C blood test.** / For all people born from 1945 through  1965 and any individual with known risks for hepatitis C.  Skin self-exam. / Monthly.  Influenza vaccine. / Every year.  Tetanus, diphtheria, and acellular pertussis (Tdap/Td) vaccine.** / Consult your health care provider. Pregnant women should receive 1 dose of Tdap vaccine during each pregnancy. 1 dose of Td every 10 years.  Varicella vaccine.** / Consult your health care provider. Pregnant females who do not have evidence of immunity should receive the first dose after pregnancy.  Zoster vaccine.** / 1 dose for adults aged 60 years or older.  Measles, mumps, rubella (MMR) vaccine.** / You need at least 1 dose of MMR if you were born in 1957 or later. You may also need a 2nd dose. For females of childbearing age, rubella immunity should be determined. If there is no evidence of immunity, females who are not pregnant should be vaccinated. If there is no evidence of immunity, females who are pregnant should delay immunization until after pregnancy.  Pneumococcal 13-valent conjugate (PCV13) vaccine.** / Consult your health care provider.  Pneumococcal polysaccharide (PPSV23) vaccine.** / 1 to 2 doses if you smoke cigarettes or if you have certain conditions.  Meningococcal vaccine.** / Consult your health care provider.  Hepatitis A vaccine.** / Consult your health care provider.  Hepatitis B vaccine.** / Consult your health care provider.  Haemophilus influenzae type b (Hib) vaccine.** / Consult your health care provider. Ages 65   years and over  Blood pressure check.** / Every 1 to 2 years.  Lipid and cholesterol check.** / Every 5 years beginning at age 22 years.  Lung cancer screening. / Every year if you are aged 73-80 years and have a 30-pack-year history of smoking and currently smoke or have quit within the past 15 years. Yearly screening is stopped once you have quit smoking for at least 15 years or develop a health problem that would prevent you from having lung cancer  treatment.  Clinical breast exam.** / Every year after age 4 years.  BRCA-related cancer risk assessment.** / For women who have family members with a BRCA-related cancer (breast, ovarian, tubal, or peritoneal cancers).  Mammogram.** / Every year beginning at age 40 years and continuing for as long as you are in good health. Consult with your health care provider.  Pap test.** / Every 3 years starting at age 9 years through age 34 or 91 years with 3 consecutive normal Pap tests. Testing can be stopped between 65 and 70 years with 3 consecutive normal Pap tests and no abnormal Pap or HPV tests in the past 10 years.  HPV screening.** / Every 3 years from ages 57 years through ages 64 or 45 years with a history of 3 consecutive normal Pap tests. Testing can be stopped between 65 and 70 years with 3 consecutive normal Pap tests and no abnormal Pap or HPV tests in the past 10 years.  Fecal occult blood test (FOBT) of stool. / Every year beginning at age 15 years and continuing until age 17 years. You may not need to do this test if you get a colonoscopy every 10 years.  Flexible sigmoidoscopy or colonoscopy.** / Every 5 years for a flexible sigmoidoscopy or every 10 years for a colonoscopy beginning at age 86 years and continuing until age 71 years.  Hepatitis C blood test.** / For all people born from [birth year] through 1964 and any individual with known risks for hepatitis C.  Osteoporosis screening.** / A one-time screening for women ages 83 years and over and women at risk for fractures or osteoporosis.  Skin self-exam. / Monthly.  Influenza vaccine. / Every year.  Tetanus, diphtheria, and acellular pertussis (Tdap/Td) vaccine.** / 1 dose of Td every 10 years.  Varicella vaccine.** / Consult your health care provider.  Zoster vaccine.** / 1 dose for adults aged 61 years or older.  Pneumococcal 13-valent conjugate (PCV13) vaccine.** / Consult your health care provider.  Pneumococcal  polysaccharide (PPSV23) vaccine.** / 1 dose for all adults aged 28 years and older.  Meningococcal vaccine.** / Consult your health care provider.  Hepatitis A vaccine.** / Consult your health care provider.  Hepatitis B vaccine.** / Consult your health care provider.  Haemophilus influenzae type b (Hib) vaccine.** / Consult your health care provider. ** Family history and personal history of risk and conditions may change your health care provider's recommendations. Document Released: 07/14/2001 Document Revised: 10/02/2013 Document Reviewed: 10/13/2010 [facility] Patient Information 2015 [place], [place]. This information is not intended to replace advice given to you by your health care provider. Make sure you discuss any questions you have with your health care provider.

## 2014-06-07 LAB — CYTOLOGY - PAP

## 2014-06-11 ENCOUNTER — Other Ambulatory Visit: Payer: Self-pay | Admitting: Family Medicine

## 2014-06-11 NOTE — Telephone Encounter (Signed)
Last filled:  01/22/14 Amt: 30, 2 refills Last OV:  06/05/14 Med filled

## 2014-06-20 DIAGNOSIS — Z1212 Encounter for screening for malignant neoplasm of rectum: Secondary | ICD-10-CM | POA: Diagnosis not present

## 2014-06-20 DIAGNOSIS — Z1211 Encounter for screening for malignant neoplasm of colon: Secondary | ICD-10-CM | POA: Diagnosis not present

## 2014-06-21 LAB — COLOGUARD

## 2014-06-27 LAB — COLOGUARD: COLOGUARD: NEGATIVE

## 2014-07-04 ENCOUNTER — Other Ambulatory Visit: Payer: Self-pay | Admitting: Family Medicine

## 2014-07-23 ENCOUNTER — Encounter: Payer: Self-pay | Admitting: Family Medicine

## 2014-07-30 DIAGNOSIS — H2513 Age-related nuclear cataract, bilateral: Secondary | ICD-10-CM | POA: Diagnosis not present

## 2014-07-30 DIAGNOSIS — H40023 Open angle with borderline findings, high risk, bilateral: Secondary | ICD-10-CM | POA: Diagnosis not present

## 2014-09-26 ENCOUNTER — Other Ambulatory Visit (INDEPENDENT_AMBULATORY_CARE_PROVIDER_SITE_OTHER): Payer: Medicare Other

## 2014-09-26 DIAGNOSIS — E785 Hyperlipidemia, unspecified: Secondary | ICD-10-CM

## 2014-09-26 LAB — LIPID PANEL
CHOL/HDL RATIO: 2
Cholesterol: 139 mg/dL (ref 0–200)
HDL: 62.1 mg/dL (ref >39.00)
LDL Cholesterol: 64 mg/dL (ref 0–99)
NONHDL: 76.9
Triglycerides: 63 mg/dL (ref 0.0–149.0)
VLDL: 12.6 mg/dL (ref 0.0–40.0)

## 2014-09-26 LAB — HEPATIC FUNCTION PANEL
ALT: 17 U/L (ref 0–35)
AST: 20 U/L (ref 0–37)
Albumin: 4.4 g/dL (ref 3.5–5.2)
Alkaline Phosphatase: 39 U/L (ref 39–117)
Bilirubin, Direct: 0.1 mg/dL (ref 0.0–0.3)
Total Bilirubin: 0.6 mg/dL (ref 0.2–1.2)
Total Protein: 7.1 g/dL (ref 6.0–8.3)

## 2014-11-22 ENCOUNTER — Other Ambulatory Visit: Payer: Self-pay | Admitting: Family Medicine

## 2014-12-06 ENCOUNTER — Encounter: Payer: Self-pay | Admitting: Internal Medicine

## 2014-12-06 ENCOUNTER — Ambulatory Visit (INDEPENDENT_AMBULATORY_CARE_PROVIDER_SITE_OTHER): Payer: Medicare Other | Admitting: Internal Medicine

## 2014-12-06 VITALS — BP 118/64 | HR 64 | Ht 65.0 in | Wt 188.4 lb

## 2014-12-06 DIAGNOSIS — E785 Hyperlipidemia, unspecified: Secondary | ICD-10-CM

## 2014-12-06 NOTE — Progress Notes (Signed)
Cardiology Office Note   Date:  12/06/2014   ID:  OURANIA [name], DOB 28-Feb-1946, MRN [id number]  PCP:  Garnet Koyanagi, DO  Cardiologist:   Dorris Carnes, MD   No chief complaint on file.     History of Present Illness: Tina [name] is a 69 y.o. female with a history of CADs/p NSTEMI in 2004 with PTCA/stent to LAD). 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 .  Patient was last in clinic in June 2o15 Lipids this spring LDL was 64, HDL was 62.    Overall feeling ok  SOme aging complaints  Cramps in legs No CP  Staying active  No SOB     Current Outpatient Prescriptions  Medication Sig Dispense Refill  . alendronate (FOSAMAX) 70 MG tablet TAKE 1 TABLET BY MOUTH EVERY 7 DAYS. TAKE WITH A FULL GLASS OF WATER ON AN EMPTY STOMACH 4 tablet 11  . aspirin 81 MG tablet Take 81 mg by mouth daily.      . Calcium Carbonate-Vit D-Min (CALCIUM 1200 PO) Take 1,200 mg by mouth daily. 1 tab po qd    . CRESTOR 20 MG tablet TAKE 1 TABLET BY MOUTH DAILY. REPEAT LABS ARE DUE NOW. 30 tablet 5  . Fish Oil OIL 2 tabs po qd     . Multiple Vitamin (MULTIVITAMIN) capsule Take 1 capsule by mouth daily.      . ramipril (ALTACE) 10 MG capsule TAKE ONE CAPSULE BY MOUTH DAILY 30 capsule 5   No current facility-administered medications for this visit.    Allergies:   Review of patient's allergies indicates no known allergies.   Past Medical History  Diagnosis Date  . Hyperlipidemia   . Myocardial infarct hx of 2004  . HTN (hypertension)   . Goiter   . Fatty liver     Past Surgical History  Procedure Laterality Date  . Cardiac catheterization  2004    stent  . Sleep study  2012     Social History:  The patient  reports that she quit smoking about 7 years ago. She quit smokeless tobacco use about 12 years ago. She reports that she  drinks about 2.4 oz of alcohol per week. She reports that she does not use illicit drugs.   Family History:  The patient's family history includes Cancer (age of onset: 9) in her sister; Cancer (age of onset: 41) in her father; Coronary artery disease in an other family member; Diabetes in her maternal grandmother; Heart disease (age of onset: 46) in her mother; Hypertension in her brother, father, mother, and sister; Lung cancer in an other family member; Ovarian cancer in an other family member.    ROS:  Please see the history of present illness. All other systems are reviewed and  Negative to the above problem except as noted.    PHYSICAL EXAM: VS:  BP 118/64 mmHg  Ht 5\' 5"  (1.651 m)  Wt 188 lb 6.4 oz (85.458 kg)  BMI 31.35 kg/m2  GEN: Well nourished, well developed, in no acute distress HEENT: normal Neck: no JVD, carotid bruits, or masses Cardiac: RRR; no murmurs, rubs, or gallops,no edema  Respiratory:  clear to auscultation bilaterally, normal work of breathing GI: soft, nontender, nondistended, + BS  No hepatomegaly  MS: no  deformity Moving all extremities   Skin: warm and dry, no rash Neuro:  Strength and sensation are intact Psych: euthymic mood, full affect   EKG:  EKG is not ordered today.   Lipid Panel    Component Value Date/Time   CHOL 139 09/26/2014 0900   TRIG 63.0 09/26/2014 0900   HDL 62.10 09/26/2014 0900   CHOLHDL 2 09/26/2014 0900   VLDL 12.6 09/26/2014 0900   LDLCALC 64 09/26/2014 0900   LDLDIRECT 123.7 05/30/2013 0811      Wt Readings from Last 3 Encounters:  12/06/14 188 lb 6.4 oz (85.458 kg)  06/05/14 207 lb 3.2 oz (93.985 kg)  01/03/14 205 lb (92.987 kg)      ASSESSMENT AND PLAN: 1  CAD  No symptoms of angina.  Keep on same regimen  2.  HL  Excellent control  Encouraged her to stay active  She is working with wt watchers to lose wt  Goal 130s       Disposition:   F/u with primary MD  I will be available as needed   Signed, Dorris Carnes, MD  12/06/2014 11:47 AM    [address], [place], [place]  [address] Phone: [phone]; Fax: [phone]

## 2014-12-06 NOTE — Patient Instructions (Signed)
Your physician recommends that you continue on your current medications as directed. Please refer to the Current Medication list given to you today. Your physician recommends that you schedule a follow-up appointment as needed with Dr. Harrington Challenger.

## 2014-12-11 ENCOUNTER — Ambulatory Visit: Payer: Medicare Other | Admitting: Family Medicine

## 2014-12-21 ENCOUNTER — Other Ambulatory Visit: Payer: Self-pay | Admitting: Family Medicine

## 2015-01-08 NOTE — Telephone Encounter (Signed)
Mailed letter °

## 2015-01-28 DIAGNOSIS — H02831 Dermatochalasis of right upper eyelid: Secondary | ICD-10-CM | POA: Diagnosis not present

## 2015-01-28 DIAGNOSIS — H40023 Open angle with borderline findings, high risk, bilateral: Secondary | ICD-10-CM | POA: Diagnosis not present

## 2015-01-28 DIAGNOSIS — H02834 Dermatochalasis of left upper eyelid: Secondary | ICD-10-CM | POA: Diagnosis not present

## 2015-01-28 DIAGNOSIS — H02832 Dermatochalasis of right lower eyelid: Secondary | ICD-10-CM | POA: Diagnosis not present

## 2015-01-28 DIAGNOSIS — H2513 Age-related nuclear cataract, bilateral: Secondary | ICD-10-CM | POA: Diagnosis not present

## 2015-02-25 ENCOUNTER — Encounter: Payer: Self-pay | Admitting: Family Medicine

## 2015-02-25 ENCOUNTER — Ambulatory Visit (INDEPENDENT_AMBULATORY_CARE_PROVIDER_SITE_OTHER): Payer: Medicare Other | Admitting: Family Medicine

## 2015-02-25 VITALS — BP 128/86 | HR 67 | Temp 98.1°F | Ht 65.0 in | Wt 195.0 lb

## 2015-02-25 DIAGNOSIS — I252 Old myocardial infarction: Secondary | ICD-10-CM

## 2015-02-25 DIAGNOSIS — E785 Hyperlipidemia, unspecified: Secondary | ICD-10-CM

## 2015-02-25 DIAGNOSIS — I10 Essential (primary) hypertension: Secondary | ICD-10-CM | POA: Diagnosis not present

## 2015-02-25 DIAGNOSIS — Z1159 Encounter for screening for other viral diseases: Secondary | ICD-10-CM

## 2015-02-25 DIAGNOSIS — Z23 Encounter for immunization: Secondary | ICD-10-CM | POA: Diagnosis not present

## 2015-02-25 LAB — COMPREHENSIVE METABOLIC PANEL
ALT: 17 U/L (ref 0–35)
AST: 20 U/L (ref 0–37)
Albumin: 4.3 g/dL (ref 3.5–5.2)
Alkaline Phosphatase: 41 U/L (ref 39–117)
BILIRUBIN TOTAL: 0.7 mg/dL (ref 0.2–1.2)
BUN: 16 mg/dL (ref 6–23)
CO2: 30 mEq/L (ref 19–32)
Calcium: 9.8 mg/dL (ref 8.4–10.5)
Chloride: 102 mEq/L (ref 96–112)
Creatinine, Ser: 0.66 mg/dL (ref 0.40–1.20)
GFR: 94.66 mL/min (ref >60.00)
GLUCOSE: 98 mg/dL (ref 70–99)
Potassium: 4.1 mEq/L (ref 3.5–5.1)
SODIUM: 141 meq/L (ref 135–145)
Total Protein: 7.1 g/dL (ref 6.0–8.3)

## 2015-02-25 LAB — LIPID PANEL
CHOL/HDL RATIO: 2
Cholesterol: 179 mg/dL (ref 0–200)
HDL: 76.1 mg/dL (ref >39.00)
LDL Cholesterol: 82 mg/dL (ref 0–99)
NONHDL: 102.54
Triglycerides: 104 mg/dL (ref 0.0–149.0)
VLDL: 20.8 mg/dL (ref 0.0–40.0)

## 2015-02-25 NOTE — Assessment & Plan Note (Signed)
Con't altace con'trolled rto 6 montsh

## 2015-02-25 NOTE — Progress Notes (Signed)
Pre visit review using our clinic review tool, if applicable. No additional management support is needed unless otherwise documented below in the visit note. 

## 2015-02-25 NOTE — Assessment & Plan Note (Signed)
No questions Check labs

## 2015-02-25 NOTE — Addendum Note (Signed)
Addended by: Reino Bellis on: 02/25/2015 09:11 AM   Modules accepted: Orders

## 2015-02-25 NOTE — Patient Instructions (Signed)
Cholesterol  Cholesterol is a white, waxy, fat-like substance needed by your body in small amounts. The liver makes all the cholesterol you need. Cholesterol is carried from the liver by the blood through the blood vessels. Deposits of cholesterol (plaque) may build up on blood vessel walls. These make the arteries narrower and stiffer. Cholesterol plaques increase the risk for heart attack and stroke.   You cannot feel your cholesterol level even if it is very high. The only way to know it is high is with a blood test. Once you know your cholesterol levels, you should keep a record of the test results. Work with your health care Shifa Brisbon to keep your levels in the desired range.   WHAT DO THE RESULTS MEAN?  · Total cholesterol is a rough measure of all the cholesterol in your blood.    · LDL is the so-called bad cholesterol. This is the type that deposits cholesterol in the walls of the arteries. You want this level to be low.    · HDL is the good cholesterol because it cleans the arteries and carries the LDL away. You want this level to be high.  · Triglycerides are fat that the body can either burn for energy or store. High levels are closely linked to heart disease.    WHAT ARE THE DESIRED LEVELS OF CHOLESTEROL?  · Total cholesterol below 200.    · LDL below 100 for people at risk, below 70 for those at very high risk.    · HDL above 50 is good, above 60 is best.    · Triglycerides below 150.    HOW CAN I LOWER MY CHOLESTEROL?  · Diet. Follow your diet programs as directed by your health care Whitten Andreoni.    ¨ Choose fish or white meat chicken and turkey, roasted or baked. Limit fatty cuts of red meat, fried foods, and processed meats, such as sausage and lunch meats.    ¨ Eat lots of fresh fruits and vegetables.  ¨ Choose whole grains, beans, pasta, potatoes, and cereals.    ¨ Use only small amounts of olive, corn, or canola oils.    ¨ Avoid butter, mayonnaise, shortening, or palm kernel oils.  ¨ Avoid foods with  trans fats.    ¨ Drink skim or nonfat milk and eat low-fat or nonfat yogurt and cheeses. Avoid whole milk, cream, ice cream, egg yolks, and full-fat cheeses.    ¨ Healthy desserts include angel food cake, ginger snaps, animal crackers, hard candy, popsicles, and low-fat or nonfat frozen yogurt. Avoid pastries, cakes, pies, and cookies.    · Exercise. Follow your exercise programs as directed by your health care Sion Reinders.    ¨ A regular program helps decrease LDL and raise HDL.    ¨ A regular program helps with weight control.    ¨ Do things that increase your activity level like gardening, walking, or taking the stairs. Ask your health care Kent Braunschweig about how you can be more active in your daily life.    · Medicine. Take medicine only as directed by your health care Denzel Etienne.    ¨ Medicine may be prescribed by your health care Lanecia Sliva to help lower cholesterol and decrease the risk for heart disease.    ¨ If you have several risk factors, you may need medicine even if your levels are normal.  Document Released: 02/10/2001 Document Revised: 10/02/2013 Document Reviewed: 03/01/2013  Exit[employer]® Patient Information ©2015 [employer]. This information is not intended to replace advice given to you by your health care Berlinda Farve. Make sure you discuss any questions you have with your   health care Emersynn Deatley.

## 2015-02-25 NOTE — Progress Notes (Signed)
Patient ID: Tina [name], female    DOB: August 01, 1945  Age: 69 y.o. MRN: [id number]    Subjective:  Subjective HPI Tina [name] presents for f/u cholesterol.  No complaints.   Review of Systems  Constitutional: Negative for diaphoresis, appetite change, fatigue and unexpected weight change.  Eyes: Negative for pain, redness and visual disturbance.  Respiratory: Negative for cough, chest tightness, shortness of breath and wheezing.   Cardiovascular: Negative for chest pain, palpitations and leg swelling.  Endocrine: Negative for cold intolerance, heat intolerance, polydipsia, polyphagia and polyuria.  Genitourinary: Negative for dysuria, frequency and difficulty urinating.  Neurological: Negative for dizziness, light-headedness, numbness and headaches.  All other systems reviewed and are negative.   History Past Medical History  Diagnosis Date  . Hyperlipidemia   . Myocardial infarct hx of 2004  . HTN (hypertension)   . Goiter   . Fatty liver     She has past surgical history that includes Cardiac catheterization (2004) and sleep study (2012).   Her family history includes Cancer (age of onset: 77) in her sister; Cancer (age of onset: 46) in her father; Coronary artery disease in an other family member; Diabetes in her maternal grandmother; Heart disease (age of onset: 7) in her mother; Hypertension in her brother, father, mother, and sister; Lung cancer in an other family member; Ovarian cancer in an other family member.She reports that she quit smoking about 7 years ago. She quit smokeless tobacco use about 12 years ago. She reports that she drinks about 2.4 oz of alcohol per week. She reports that she does not use illicit drugs.  Current Outpatient Prescriptions on File Prior to Visit  Medication Sig Dispense Refill  . alendronate (FOSAMAX) 70 MG tablet TAKE 1 TABLET BY MOUTH EVERY 7 DAYS. TAKE WITH A FULL GLASS OF WATER ON AN EMPTY STOMACH 4 tablet 11  . aspirin 81 MG tablet  Take 81 mg by mouth daily.      . Calcium Carbonate-Vit D-Min (CALCIUM 1200 PO) Take 1,200 mg by mouth daily. 1 tab po qd    . Fish Oil OIL 2 tabs po qd     . Multiple Vitamin (MULTIVITAMIN) capsule Take 1 capsule by mouth daily.      . ramipril (ALTACE) 10 MG capsule TAKE ONE CAPSULE BY MOUTH DAILY 30 capsule 5  . rosuvastatin (CRESTOR) 20 MG tablet TAKE 1 TABLET BY MOUTH DAILY 30 tablet 1   No current facility-administered medications on file prior to visit.     Objective:  Objective Physical Exam  Constitutional: She is oriented to person, place, and time. She appears well-developed and well-nourished.  HENT:  Head: Normocephalic and atraumatic.  Eyes: Conjunctivae and EOM are normal.  Neck: Normal range of motion. Neck supple. No JVD present. Carotid bruit is not present. No thyromegaly present.  Cardiovascular: Normal rate, regular rhythm and normal heart sounds.   No murmur heard. Pulmonary/Chest: Effort normal and breath sounds normal. No respiratory distress. She has no wheezes. She has no rales. She exhibits no tenderness.  Musculoskeletal: She exhibits no edema.  Lymphadenopathy:    She has no cervical adenopathy.  Neurological: She is alert and oriented to person, place, and time.  Psychiatric: She has a normal mood and affect.   BP 128/86 mmHg  Pulse 67  Temp(Src) 98.1 F (36.7 C) (Oral)  Ht 5\' 5"  (1.651 m)  Wt 195 lb (88.451 kg)  BMI 32.45 kg/m2  SpO2 99% Wt Readings from Last 3 Encounters:  02/25/15 195 lb (88.451 kg)  12/06/14 188 lb 6.4 oz (85.458 kg)  06/05/14 207 lb 3.2 oz (93.985 kg)     Lab Results  Component Value Date   WBC 7.6 06/05/2014   HGB 14.1 06/05/2014   HCT 41.8 06/05/2014   PLT 262.0 06/05/2014   GLUCOSE 97 06/05/2014   CHOL 139 09/26/2014   TRIG 63.0 09/26/2014   HDL 62.10 09/26/2014   LDLDIRECT 123.7 05/30/2013   LDLCALC 64 09/26/2014   ALT 17 09/26/2014   AST 20 09/26/2014   NA 142 06/05/2014   K 4.0 06/05/2014   CL 105  06/05/2014   CREATININE 0.7 06/05/2014   BUN 21 06/05/2014   CO2 26 06/05/2014   TSH 1.85 06/05/2014    No results found.   Assessment & Plan:  Plan I am having [name] maintain her aspirin, Calcium Carbonate-Vit D-Min (CALCIUM 1200 PO), Fish Oil, multivitamin, alendronate, ramipril, and rosuvastatin.  No orders of the defined types were placed in this encounter.    Problem List Items Addressed This Visit    MYOCARDIAL INFARCTION, HX OF    No questions Check labs      Hyperlipidemia LDL goal <100    con't crestor Lab Results  Component Value Date   CHOL 139 09/26/2014   HDL 62.10 09/26/2014   LDLCALC 64 09/26/2014   LDLDIRECT 123.7 05/30/2013   TRIG 63.0 09/26/2014   CHOLHDL 2 09/26/2014         Essential hypertension    Con't altace con'trolled rto 6 montsh       Other Visit Diagnoses    Hyperlipidemia    -  Primary    Relevant Orders    Comp Met (CMET)    Lipid panel    Need for hepatitis C screening test        Relevant Orders    Hepatitis C antibody     flu and prevnar given  Follow-up: Return in about 6 months (around 08/25/2015), or if symptoms worsen or fail to improve, for hypertension, hyperlipidemia, annual exam, fasting.  Garnet Koyanagi, DO

## 2015-02-25 NOTE — Assessment & Plan Note (Signed)
con't crestor Lab Results  Component Value Date   CHOL 139 09/26/2014   HDL 62.10 09/26/2014   LDLCALC 64 09/26/2014   LDLDIRECT 123.7 05/30/2013   TRIG 63.0 09/26/2014   CHOLHDL 2 09/26/2014

## 2015-02-26 LAB — HEPATITIS C ANTIBODY: HCV Ab: NEGATIVE

## 2015-03-18 ENCOUNTER — Encounter: Payer: Self-pay | Admitting: Physician Assistant

## 2015-03-18 ENCOUNTER — Ambulatory Visit (INDEPENDENT_AMBULATORY_CARE_PROVIDER_SITE_OTHER): Payer: Medicare Other | Admitting: Physician Assistant

## 2015-03-18 ENCOUNTER — Ambulatory Visit (HOSPITAL_BASED_OUTPATIENT_CLINIC_OR_DEPARTMENT_OTHER)
Admission: RE | Admit: 2015-03-18 | Discharge: 2015-03-18 | Disposition: A | Discharge status: Home / Self Care | Payer: Medicare Other | Source: Ambulatory Visit | Attending: Physician Assistant | Admitting: Physician Assistant

## 2015-03-18 VITALS — BP 130/76 | HR 71 | Temp 98.2°F | Resp 16 | Ht 65.0 in

## 2015-03-18 DIAGNOSIS — S6991XA Unspecified injury of right wrist, hand and finger(s), initial encounter: Secondary | ICD-10-CM | POA: Insufficient documentation

## 2015-03-18 DIAGNOSIS — M25531 Pain in right wrist: Secondary | ICD-10-CM | POA: Diagnosis not present

## 2015-03-18 DIAGNOSIS — M8588 Other specified disorders of bone density and structure, other site: Secondary | ICD-10-CM | POA: Diagnosis not present

## 2015-03-18 DIAGNOSIS — S52501A Unspecified fracture of the lower end of right radius, initial encounter for closed fracture: Secondary | ICD-10-CM | POA: Diagnosis not present

## 2015-03-18 NOTE — Progress Notes (Signed)
Patient presents to clinic today c/o R wrist pain with swelling and bruising  X 1 day after fall onto outstretched hand while playing basketball. Denies pain in elbow or radiation of pain. Denies coolness or pallor of extremity. Endorses decreased ROM due to swelling and pain. Took some Aleve last night with some mild relief in pain. Endorses hx of R wrist fracture 45 years ago.  Past Medical History  Diagnosis Date  . Hyperlipidemia   . Myocardial infarct ([place]) hx of 2004  . HTN (hypertension)   . Goiter   . Fatty liver     Current Outpatient Prescriptions on File Prior to Visit  Medication Sig Dispense Refill  . alendronate (FOSAMAX) 70 MG tablet TAKE 1 TABLET BY MOUTH EVERY 7 DAYS. TAKE WITH A FULL GLASS OF WATER ON AN EMPTY STOMACH 4 tablet 11  . aspirin 81 MG tablet Take 81 mg by mouth daily.      . Calcium Carbonate-Vit D-Min (CALCIUM 1200 PO) Take 1,200 mg by mouth daily. 1 tab po qd    . Fish Oil OIL 2 tabs po qd     . Multiple Vitamin (MULTIVITAMIN) capsule Take 1 capsule by mouth daily.      . ramipril (ALTACE) 10 MG capsule TAKE ONE CAPSULE BY MOUTH DAILY 30 capsule 5  . rosuvastatin (CRESTOR) 20 MG tablet TAKE 1 TABLET BY MOUTH DAILY 30 tablet 1   No current facility-administered medications on file prior to visit.    No Known Allergies  Family History  Problem Relation Age of Onset  . Coronary artery disease    . Lung cancer    . Ovarian cancer    . Diabetes Maternal Grandmother   . Hypertension Mother   . Heart disease Mother 13    MI--- died from #3 at 5  . Hypertension Father   . Cancer Father 53    ovarian  . Hypertension Sister   . Hypertension Brother   . Cancer Sister 28    brain tumor    Social History   Social History  . Marital Status: Divorced    Spouse Name: N/A  . Number of Children: N/A  . Years of Education: N/A   Occupational History  . nurse Other    [employer]    Social History Main Topics  . Smoking status: Former Smoker  -- 1.00 packs/day for 30 years    Quit date: 07/17/2007  . Smokeless tobacco: Former Systems developer    Quit date: 06/01/2002  . Alcohol Use: 2.4 oz/week    4 Glasses of wine per week  . Drug Use: No  . Sexual Activity:    Partners: Male   Other Topics Concern  . None   Social History Narrative   Occupation:[employer]   Divorced   Former Smoker quit 2004   Drug use-no   Regular exercise-treadmill 3x a week    Review of Systems - See HPI.  All other ROS are negative.  BP 130/76 mmHg  Pulse 71  Temp(Src) 98.2 F (36.8 C) (Oral)  Resp 16  Ht _0  (1.651 m)  Wt   SpO2 99%  Physical Exam  Constitutional: She is oriented to person, place, and time and well-developed, well-nourished, and in no distress.  HENT:  Head: Normocephalic and atraumatic.  Cardiovascular: Normal rate, regular rhythm, normal heart sounds and intact distal pulses.   Pulmonary/Chest: Effort normal and breath sounds normal. No respiratory distress. She has no wheezes. She has no  rales. She exhibits no tenderness.  Musculoskeletal:       Right wrist: She exhibits decreased range of motion, tenderness, bony tenderness and swelling. She exhibits no deformity.  Neurological: She is alert and oriented to person, place, and time.  Vitals reviewed.   Recent Results (from the past 2160 hour(s))  Comp Met (CMET)     Status: None   Collection Time: 02/25/15  9:12 AM  Result Value Ref Range   Sodium 141 135 - 145 mEq/L   Potassium 4.1 3.5 - 5.1 mEq/L   Chloride 102 96 - 112 mEq/L   CO2 30 19 - 32 mEq/L   Glucose, Bld 98 70 - 99 mg/dL   BUN 16 6 - 23 mg/dL   Creatinine, Ser 0.66 0.40 - 1.20 mg/dL   Total Bilirubin 0.7 0.2 - 1.2 mg/dL   Alkaline Phosphatase 41 39 - 117 U/L   AST 20 0 - 37 U/L   ALT 17 0 - 35 U/L   Total Protein 7.1 6.0 - 8.3 g/dL   Albumin 4.3 3.5 - 5.2 g/dL   Calcium 9.8 8.4 - 10.5 mg/dL   GFR 94.66 >60.00 mL/min  Lipid panel     Status: None   Collection Time: 02/25/15  9:12 AM    Result Value Ref Range   Cholesterol 179 0 - 200 mg/dL    Comment: ATP III Classification       Desirable:  < 200 mg/dL               Borderline High:  200 - 239 mg/dL          High:  > = 240 mg/dL   Triglycerides 104.0 0.0 - 149.0 mg/dL    Comment: Normal:  <150 mg/dLBorderline High:  150 - 199 mg/dL   HDL 76.10 >39.00 mg/dL   VLDL 20.8 0.0 - 40.0 mg/dL   LDL Cholesterol 82 0 - 99 mg/dL   Total CHOL/HDL Ratio 2     Comment:                Men          Women1/2 Average Risk     3.4          3.3Average Risk          5.0          4.42X Average Risk          9.6          7.13X Average Risk          15.0          11.0                       NonHDL 102.54     Comment: NOTE:  Non-HDL goal should be 30 mg/dL higher than patient's LDL goal (i.e. LDL goal of < 70 mg/dL, would have non-HDL goal of < 100 mg/dL)  Hepatitis C antibody     Status: None   Collection Time: 02/25/15  9:12 AM  Result Value Ref Range   HCV Ab NEGATIVE NEGATIVE    Assessment/Plan: Right wrist injury Concern for fracture versus simple sprain. Will obtain x-ray today to further assess. Wrist bracing discussed (patient has one at home). She will wear daily over the next week. RICE discussed. Patient declines Rx pain medication. Recommend Aleve twice daily. If any fracture is noted on imaging, will send urgently to Ortho. Otherwise will follow-up 1 week.

## 2015-03-18 NOTE — Progress Notes (Signed)
Pre visit review using our clinic review tool, if applicable. No additional management support is needed unless otherwise documented below in the visit note/SLS  

## 2015-03-18 NOTE — Patient Instructions (Signed)
Please go downstairs for x-ray. I will call you with your results.  Keep the extremity elevated and in the brace. Ice the area. Take Aleve as directed for pain.  If any fracture is noted on x-ray, we will send you to Orthopedic Surgery. If negative, we will continue the current regimen and follow-up in 1 week.

## 2015-03-18 NOTE — Assessment & Plan Note (Signed)
Concern for fracture versus simple sprain. Will obtain x-ray today to further assess. Wrist bracing discussed (patient has one at home). She will wear daily over the next week. RICE discussed. Patient declines Rx pain medication. Recommend Aleve twice daily. If any fracture is noted on imaging, will send urgently to Ortho. Otherwise will follow-up 1 week.

## 2015-04-02 DIAGNOSIS — S52501A Unspecified fracture of the lower end of right radius, initial encounter for closed fracture: Secondary | ICD-10-CM | POA: Diagnosis not present

## 2015-04-09 ENCOUNTER — Other Ambulatory Visit: Payer: Self-pay | Admitting: Family Medicine

## 2015-05-01 DIAGNOSIS — S52501D Unspecified fracture of the lower end of right radius, subsequent encounter for closed fracture with routine healing: Secondary | ICD-10-CM | POA: Diagnosis not present

## 2015-05-28 DIAGNOSIS — S52501D Unspecified fracture of the lower end of right radius, subsequent encounter for closed fracture with routine healing: Secondary | ICD-10-CM | POA: Diagnosis not present

## 2015-06-24 ENCOUNTER — Other Ambulatory Visit: Payer: Self-pay | Admitting: Family Medicine

## 2015-06-24 NOTE — Telephone Encounter (Signed)
Please schedule a physical exam.      KP

## 2015-06-25 NOTE — Telephone Encounter (Signed)
Patient scheduled CPE 10/11/2015

## 2015-08-01 DIAGNOSIS — H02831 Dermatochalasis of right upper eyelid: Secondary | ICD-10-CM | POA: Diagnosis not present

## 2015-08-01 DIAGNOSIS — H40023 Open angle with borderline findings, high risk, bilateral: Secondary | ICD-10-CM | POA: Diagnosis not present

## 2015-08-01 DIAGNOSIS — H02834 Dermatochalasis of left upper eyelid: Secondary | ICD-10-CM | POA: Diagnosis not present

## 2015-08-01 DIAGNOSIS — H2513 Age-related nuclear cataract, bilateral: Secondary | ICD-10-CM | POA: Diagnosis not present

## 2015-08-27 ENCOUNTER — Other Ambulatory Visit: Payer: Self-pay | Admitting: Family Medicine

## 2015-10-10 ENCOUNTER — Encounter: Payer: Self-pay | Admitting: Behavioral Health

## 2015-10-10 ENCOUNTER — Telehealth: Payer: Self-pay | Admitting: Behavioral Health

## 2015-10-10 NOTE — Telephone Encounter (Signed)
Pre-Visit Call completed with patient and chart updated.   Pre-Visit Info documented in Specialty Comments under [employer].    

## 2015-10-10 NOTE — Telephone Encounter (Signed)
Pt returned your call.  

## 2015-10-10 NOTE — Telephone Encounter (Signed)
Unable to reach patient at time of Pre-Visit Call.  Left message for patient to return call when available.    

## 2015-10-10 NOTE — Addendum Note (Signed)
Addended by: Eduard Roux E on: 10/10/2015 04:59 PM   Modules accepted: Medications

## 2015-10-11 ENCOUNTER — Ambulatory Visit (INDEPENDENT_AMBULATORY_CARE_PROVIDER_SITE_OTHER): Payer: Medicare Other | Admitting: Family Medicine

## 2015-10-11 ENCOUNTER — Encounter: Payer: Self-pay | Admitting: Family Medicine

## 2015-10-11 ENCOUNTER — Other Ambulatory Visit: Payer: Self-pay | Admitting: Family Medicine

## 2015-10-11 VITALS — BP 128/80 | HR 61 | Temp 98.0°F | Ht 65.0 in | Wt 205.4 lb

## 2015-10-11 DIAGNOSIS — Z87891 Personal history of nicotine dependence: Secondary | ICD-10-CM | POA: Diagnosis not present

## 2015-10-11 DIAGNOSIS — Z0000 Encounter for general adult medical examination without abnormal findings: Secondary | ICD-10-CM | POA: Diagnosis not present

## 2015-10-11 DIAGNOSIS — I25810 Atherosclerosis of coronary artery bypass graft(s) without angina pectoris: Secondary | ICD-10-CM | POA: Diagnosis not present

## 2015-10-11 DIAGNOSIS — I10 Essential (primary) hypertension: Secondary | ICD-10-CM

## 2015-10-11 DIAGNOSIS — E785 Hyperlipidemia, unspecified: Secondary | ICD-10-CM

## 2015-10-11 DIAGNOSIS — Z1231 Encounter for screening mammogram for malignant neoplasm of breast: Secondary | ICD-10-CM

## 2015-10-11 DIAGNOSIS — M810 Age-related osteoporosis without current pathological fracture: Secondary | ICD-10-CM

## 2015-10-11 MED ORDER — RAMIPRIL 10 MG PO CAPS: 10.0000 mg | ORAL_CAPSULE | Freq: Every day | ORAL | Status: DC

## 2015-10-11 MED ORDER — ALENDRONATE SODIUM 70 MG PO TABS: ORAL_TABLET | ORAL | Status: DC

## 2015-10-11 MED ORDER — ROSUVASTATIN CALCIUM 20 MG PO TABS: 20.0000 mg | ORAL_TABLET | Freq: Every day | ORAL | Status: DC

## 2015-10-11 NOTE — Patient Instructions (Signed)
Preventive Care for Adults, Female A healthy lifestyle and preventive care can promote health and wellness. Preventive health guidelines for women include the following key practices.  A routine yearly physical is a good way to check with your health care provider about your health and preventive screening. It is a chance to share any concerns and updates on your health and to receive a thorough exam.  Visit your dentist for a routine exam and preventive care every 6 months. Brush your teeth twice a day and floss once a day. Good oral hygiene prevents tooth decay and gum disease.  The frequency of eye exams is based on your age, health, family medical history, use of contact lenses, and other factors. Follow your health care provider's recommendations for frequency of eye exams.  Eat a healthy diet. Foods like vegetables, fruits, whole grains, low-fat dairy products, and lean protein foods contain the nutrients you need without too many calories. Decrease your intake of foods high in solid fats, added sugars, and salt. Eat the right amount of calories for you.Get information about a proper diet from your health care provider, if necessary.  Regular physical exercise is one of the most important things you can do for your health. Most adults should get at least 150 minutes of moderate-intensity exercise (any activity that increases your heart rate and causes you to sweat) each week. In addition, most adults need muscle-strengthening exercises on 2 or more days a week.  Maintain a healthy weight. The body mass index (BMI) is a screening tool to identify possible weight problems. It provides an estimate of body fat based on height and weight. Your health care provider can find your BMI and can help you achieve or maintain a healthy weight.For adults 20 years and older:  A BMI below 18.5 is considered underweight.  A BMI of 18.5 to 24.9 is normal.  A BMI of 25 to 29.9 is considered overweight.  A  BMI of 30 and above is considered obese.  Maintain normal blood lipids and cholesterol levels by exercising and minimizing your intake of saturated fat. Eat a balanced diet with plenty of fruit and vegetables. Blood tests for lipids and cholesterol should begin at age 45 and be repeated every 5 years. If your lipid or cholesterol levels are high, you are over 50, or you are at high risk for heart disease, you may need your cholesterol levels checked more frequently.Ongoing high lipid and cholesterol levels should be treated with medicines if diet and exercise are not working.  If you smoke, find out from your health care provider how to quit. If you do not use tobacco, do not start.  Lung cancer screening is recommended for adults aged 45-80 years who are at high risk for developing lung cancer because of a history of smoking. A yearly low-dose CT scan of the lungs is recommended for people who have at least a 30-pack-year history of smoking and are a current smoker or have quit within the past 15 years. A pack year of smoking is smoking an average of 1 pack of cigarettes a day for 1 year (for example: 1 pack a day for 30 years or 2 packs a day for 15 years). Yearly screening should continue until the smoker has stopped smoking for at least 15 years. Yearly screening should be stopped for people who develop a health problem that would prevent them from having lung cancer treatment.  If you are pregnant, do not drink alcohol. If you are  breastfeeding, be very cautious about drinking alcohol. If you are not pregnant and choose to drink alcohol, do not have more than 1 drink per day. One drink is considered to be 12 ounces (355 mL) of beer, 5 ounces (148 mL) of wine, or 1.5 ounces (44 mL) of liquor.  Avoid use of street drugs. Do not share needles with anyone. Ask for help if you need support or instructions about stopping the use of drugs.  High blood pressure causes heart disease and increases the risk  of stroke. Your blood pressure should be checked at least every 1 to 2 years. Ongoing high blood pressure should be treated with medicines if weight loss and exercise do not work.  If you are 55-79 years old, ask your health care provider if you should take aspirin to prevent strokes.  Diabetes screening is done by taking a blood sample to check your blood glucose level after you have not eaten for a certain period of time (fasting). If you are not overweight and you do not have risk factors for diabetes, you should be screened once every 3 years starting at age 45. If you are overweight or obese and you are 40-70 years of age, you should be screened for diabetes every year as part of your cardiovascular risk assessment.  Breast cancer screening is essential preventive care for women. You should practice "breast self-awareness." This means understanding the normal appearance and feel of your breasts and may include breast self-examination. Any changes detected, no matter how small, should be reported to a health care provider. Women in their 20s and 30s should have a clinical breast exam (CBE) by a health care provider as part of a regular health exam every 1 to 3 years. After age 40, women should have a CBE every year. Starting at age 40, women should consider having a mammogram (breast X-ray test) every year. Women who have a family history of breast cancer should talk to their health care provider about genetic screening. Women at a high risk of breast cancer should talk to their health care providers about having an MRI and a mammogram every year.  Breast cancer gene (BRCA)-related cancer risk assessment is recommended for women who have family members with BRCA-related cancers. BRCA-related cancers include breast, ovarian, tubal, and peritoneal cancers. Having family members with these cancers may be associated with an increased risk for harmful changes (mutations) in the breast cancer genes BRCA1 and  BRCA2. Results of the assessment will determine the need for genetic counseling and BRCA1 and BRCA2 testing.  Your health care provider may recommend that you be screened regularly for cancer of the pelvic organs (ovaries, uterus, and vagina). This screening involves a pelvic examination, including checking for microscopic changes to the surface of your cervix (Pap test). You may be encouraged to have this screening done every 3 years, beginning at age 21.  For women ages 30-65, health care providers may recommend pelvic exams and Pap testing every 3 years, or they may recommend the Pap and pelvic exam, combined with testing for human papilloma virus (HPV), every 5 years. Some types of HPV increase your risk of cervical cancer. Testing for HPV may also be done on women of any age with unclear Pap test results.  Other health care providers may not recommend any screening for nonpregnant women who are considered low risk for pelvic cancer and who do not have symptoms. Ask your health care provider if a screening pelvic exam is right for   you.  If you have had past treatment for cervical cancer or a condition that could lead to cancer, you need Pap tests and screening for cancer for at least 20 years after your treatment. If Pap tests have been discontinued, your risk factors (such as having a new sexual partner) need to be reassessed to determine if screening should resume. Some women have medical problems that increase the chance of getting cervical cancer. In these cases, your health care provider may recommend more frequent screening and Pap tests.  Colorectal cancer can be detected and often prevented. Most routine colorectal cancer screening begins at the age of 50 years and continues through age 75 years. However, your health care provider may recommend screening at an earlier age if you have risk factors for colon cancer. On a yearly basis, your health care provider may provide home test kits to check  for hidden blood in the stool. Use of a small camera at the end of a tube, to directly examine the colon (sigmoidoscopy or colonoscopy), can detect the earliest forms of colorectal cancer. Talk to your health care provider about this at age 50, when routine screening begins. Direct exam of the colon should be repeated every 5-10 years through age 75 years, unless early forms of precancerous polyps or small growths are found.  People who are at an increased risk for hepatitis B should be screened for this virus. You are considered at high risk for hepatitis B if:  You were born in a country where hepatitis B occurs often. Talk with your health care provider about which countries are considered high risk.  Your parents were born in a high-risk country and you have not received a shot to protect against hepatitis B (hepatitis B vaccine).  You have HIV or AIDS.  You use needles to inject street drugs.  You live with, or have sex with, someone who has hepatitis B.  You get hemodialysis treatment.  You take certain medicines for conditions like cancer, organ transplantation, and autoimmune conditions.  Hepatitis C blood testing is recommended for all people born from 1945 through 1965 and any individual with known risks for hepatitis C.  Practice safe sex. Use condoms and avoid high-risk sexual practices to reduce the spread of sexually transmitted infections (STIs). STIs include gonorrhea, chlamydia, syphilis, trichomonas, herpes, HPV, and human immunodeficiency virus (HIV). Herpes, HIV, and HPV are viral illnesses that have no cure. They can result in disability, cancer, and death.  You should be screened for sexually transmitted illnesses (STIs) including gonorrhea and chlamydia if:  You are sexually active and are younger than 24 years.  You are older than 24 years and your health care provider tells you that you are at risk for this type of infection.  Your sexual activity has changed  since you were last screened and you are at an increased risk for chlamydia or gonorrhea. Ask your health care provider if you are at risk.  If you are at risk of being infected with HIV, it is recommended that you take a prescription medicine daily to prevent HIV infection. This is called preexposure prophylaxis (PrEP). You are considered at risk if:  You are sexually active and do not regularly use condoms or know the HIV status of your partner(s).  You take drugs by injection.  You are sexually active with a partner who has HIV.  Talk with your health care provider about whether you are at high risk of being infected with HIV. If   you choose to begin PrEP, you should first be tested for HIV. You should then be tested every 3 months for as long as you are taking PrEP.  Osteoporosis is a disease in which the bones lose minerals and strength with aging. This can result in serious bone fractures or breaks. The risk of osteoporosis can be identified using a bone density scan. Women ages 67 years and over and women at risk for fractures or osteoporosis should discuss screening with their health care providers. Ask your health care provider whether you should take a calcium supplement or vitamin D to reduce the rate of osteoporosis.  Menopause can be associated with physical symptoms and risks. Hormone replacement therapy is available to decrease symptoms and risks. You should talk to your health care provider about whether hormone replacement therapy is right for you.  Use sunscreen. Apply sunscreen liberally and repeatedly throughout the day. You should seek shade when your shadow is shorter than you. Protect yourself by wearing long sleeves, pants, a wide-brimmed hat, and sunglasses year round, whenever you are outdoors.  Once a month, do a whole body skin exam, using a mirror to look at the skin on your back. Tell your health care provider of new moles, moles that have irregular borders, moles that  are larger than a pencil eraser, or moles that have changed in shape or color.  Stay current with required vaccines (immunizations).  Influenza vaccine. All adults should be immunized every year.  Tetanus, diphtheria, and acellular pertussis (Td, Tdap) vaccine. Pregnant women should receive 1 dose of Tdap vaccine during each pregnancy. The dose should be obtained regardless of the length of time since the last dose. Immunization is preferred during the 27th-36th week of gestation. An adult who has not previously received Tdap or who does not know her vaccine status should receive 1 dose of Tdap. This initial dose should be followed by tetanus and diphtheria toxoids (Td) booster doses every 10 years. Adults with an unknown or incomplete history of completing a 3-dose immunization series with Td-containing vaccines should begin or complete a primary immunization series including a Tdap dose. Adults should receive a Td booster every 10 years.  Varicella vaccine. An adult without evidence of immunity to varicella should receive 2 doses or a second dose if she has previously received 1 dose. Pregnant females who do not have evidence of immunity should receive the first dose after pregnancy. This first dose should be obtained before leaving the health care facility. The second dose should be obtained 4-8 weeks after the first dose.  Human papillomavirus (HPV) vaccine. Females aged 13-26 years who have not received the vaccine previously should obtain the 3-dose series. The vaccine is not recommended for use in pregnant females. However, pregnancy testing is not needed before receiving a dose. If a female is found to be pregnant after receiving a dose, no treatment is needed. In that case, the remaining doses should be delayed until after the pregnancy. Immunization is recommended for any person with an immunocompromised condition through the age of 61 years if she did not get any or all doses earlier. During the  3-dose series, the second dose should be obtained 4-8 weeks after the first dose. The third dose should be obtained 24 weeks after the first dose and 16 weeks after the second dose.  Zoster vaccine. One dose is recommended for adults aged 30 years or older unless certain conditions are present.  Measles, mumps, and rubella (MMR) vaccine. Adults born  before 1957 generally are considered immune to measles and mumps. Adults born in 1957 or later should have 1 or more doses of MMR vaccine unless there is a contraindication to the vaccine or there is laboratory evidence of immunity to each of the three diseases. A routine second dose of MMR vaccine should be obtained at least 28 days after the first dose for students attending postsecondary schools, health care workers, or international travelers. People who received inactivated measles vaccine or an unknown type of measles vaccine during 1963-1967 should receive 2 doses of MMR vaccine. People who received inactivated mumps vaccine or an unknown type of mumps vaccine before 1979 and are at high risk for mumps infection should consider immunization with 2 doses of MMR vaccine. For females of childbearing age, rubella immunity should be determined. If there is no evidence of immunity, females who are not pregnant should be vaccinated. If there is no evidence of immunity, females who are pregnant should delay immunization until after pregnancy. Unvaccinated health care workers born before 1957 who lack laboratory evidence of measles, mumps, or rubella immunity or laboratory confirmation of disease should consider measles and mumps immunization with 2 doses of MMR vaccine or rubella immunization with 1 dose of MMR vaccine.  Pneumococcal 13-valent conjugate (PCV13) vaccine. When indicated, a person who is uncertain of his immunization history and has no record of immunization should receive the PCV13 vaccine. All adults 65 years of age and older should receive this  vaccine. An adult aged 19 years or older who has certain medical conditions and has not been previously immunized should receive 1 dose of PCV13 vaccine. This PCV13 should be followed with a dose of pneumococcal polysaccharide (PPSV23) vaccine. Adults who are at high risk for pneumococcal disease should obtain the PPSV23 vaccine at least 8 weeks after the dose of PCV13 vaccine. Adults older than 69 years of age who have normal immune system function should obtain the PPSV23 vaccine dose at least 1 year after the dose of PCV13 vaccine.  Pneumococcal polysaccharide (PPSV23) vaccine. When PCV13 is also indicated, PCV13 should be obtained first. All adults aged 65 years and older should be immunized. An adult younger than age 65 years who has certain medical conditions should be immunized. Any person who resides in a nursing home or long-term care facility should be immunized. An adult smoker should be immunized. People with an immunocompromised condition and certain other conditions should receive both PCV13 and PPSV23 vaccines. People with human immunodeficiency virus (HIV) infection should be immunized as soon as possible after diagnosis. Immunization during chemotherapy or radiation therapy should be avoided. Routine use of PPSV23 vaccine is not recommended for American Indians, [place] Natives, or people younger than 65 years unless there are medical conditions that require PPSV23 vaccine. When indicated, people who have unknown immunization and have no record of immunization should receive PPSV23 vaccine. One-time revaccination 5 years after the first dose of PPSV23 is recommended for people aged 19-64 years who have chronic kidney failure, nephrotic syndrome, asplenia, or immunocompromised conditions. People who received 1-2 doses of PPSV23 before age 65 years should receive another dose of PPSV23 vaccine at age 65 years or later if at least 5 years have passed since the previous dose. Doses of PPSV23 are not  needed for people immunized with PPSV23 at or after age 65 years.  Meningococcal vaccine. Adults with asplenia or persistent complement component deficiencies should receive 2 doses of quadrivalent meningococcal conjugate (MenACWY-D) vaccine. The doses should be obtained   at least 2 months apart. Microbiologists working with certain meningococcal bacteria, [place] recruits, people at risk during an outbreak, and people who travel to or live in countries with a high rate of meningitis should be immunized. A first-year college student up through age 34 years who is living in a residence hall should receive a dose if she did not receive a dose on or after her 16th birthday. Adults who have certain high-risk conditions should receive one or more doses of vaccine.  Hepatitis A vaccine. Adults who wish to be protected from this disease, have certain high-risk conditions, work with hepatitis A-infected animals, work in hepatitis A research labs, or travel to or work in countries with a high rate of hepatitis A should be immunized. Adults who were previously unvaccinated and who anticipate close contact with an international adoptee during the first 60 days after arrival in the [place] from a country with a high rate of hepatitis A should be immunized.  Hepatitis B vaccine. Adults who wish to be protected from this disease, have certain high-risk conditions, may be exposed to blood or other infectious body fluids, are household contacts or sex partners of hepatitis B positive people, are clients or workers in certain care facilities, or travel to or work in countries with a high rate of hepatitis B should be immunized.  Haemophilus influenzae type b (Hib) vaccine. A previously unvaccinated person with asplenia or sickle cell disease or having a scheduled splenectomy should receive 1 dose of Hib vaccine. Regardless of previous immunization, a recipient of a hematopoietic stem cell transplant should receive a  3-dose series 6-12 months after her successful transplant. Hib vaccine is not recommended for adults with HIV infection. Preventive Services / Frequency Ages 35 to 4 years  Blood pressure check.** / Every 3-5 years.  Lipid and cholesterol check.** / Every 5 years beginning at age 60.  Clinical breast exam.** / Every 3 years for women in their 71s and 10s.  BRCA-related cancer risk assessment.** / For women who have family members with a BRCA-related cancer (breast, ovarian, tubal, or peritoneal cancers).  Pap test.** / Every 2 years from ages 76 through 26. Every 3 years starting at age 61 through age 76 or 93 with a history of 3 consecutive normal Pap tests.  HPV screening.** / Every 3 years from ages 37 through ages 60 to 51 with a history of 3 consecutive normal Pap tests.  Hepatitis C blood test.** / For any individual with known risks for hepatitis C.  Skin self-exam. / Monthly.  Influenza vaccine. / Every year.  Tetanus, diphtheria, and acellular pertussis (Tdap, Td) vaccine.** / Consult your health care provider. Pregnant women should receive 1 dose of Tdap vaccine during each pregnancy. 1 dose of Td every 10 years.  Varicella vaccine.** / Consult your health care provider. Pregnant females who do not have evidence of immunity should receive the first dose after pregnancy.  HPV vaccine. / 3 doses over 6 months, if 93 and younger. The vaccine is not recommended for use in pregnant females. However, pregnancy testing is not needed before receiving a dose.  Measles, mumps, rubella (MMR) vaccine.** / You need at least 1 dose of MMR if you were born in 1957 or later. You may also need a 2nd dose. For females of childbearing age, rubella immunity should be determined. If there is no evidence of immunity, females who are not pregnant should be vaccinated. If there is no evidence of immunity, females who are  pregnant should delay immunization until after pregnancy.  Pneumococcal  13-valent conjugate (PCV13) vaccine.** / Consult your health care provider.  Pneumococcal polysaccharide (PPSV23) vaccine.** / 1 to 2 doses if you smoke cigarettes or if you have certain conditions.  Meningococcal vaccine.** / 1 dose if you are age 68 to 8 years and a Market researcher living in a residence hall, or have one of several medical conditions, you need to get vaccinated against meningococcal disease. You may also need additional booster doses.  Hepatitis A vaccine.** / Consult your health care provider.  Hepatitis B vaccine.** / Consult your health care provider.  Haemophilus influenzae type b (Hib) vaccine.** / Consult your health care provider. Ages 7 to 53 years  Blood pressure check.** / Every year.  Lipid and cholesterol check.** / Every 5 years beginning at age 25 years.  Lung cancer screening. / Every year if you are aged 11-80 years and have a 30-pack-year history of smoking and currently smoke or have quit within the past 15 years. Yearly screening is stopped once you have quit smoking for at least 15 years or develop a health problem that would prevent you from having lung cancer treatment.  Clinical breast exam.** / Every year after age 48 years.  BRCA-related cancer risk assessment.** / For women who have family members with a BRCA-related cancer (breast, ovarian, tubal, or peritoneal cancers).  Mammogram.** / Every year beginning at age 41 years and continuing for as long as you are in good health. Consult with your health care provider.  Pap test.** / Every 3 years starting at age 65 years through age 37 or 70 years with a history of 3 consecutive normal Pap tests.  HPV screening.** / Every 3 years from ages 72 years through ages 60 to 40 years with a history of 3 consecutive normal Pap tests.  Fecal occult blood test (FOBT) of stool. / Every year beginning at age 21 years and continuing until age 5 years. You may not need to do this test if you get  a colonoscopy every 10 years.  Flexible sigmoidoscopy or colonoscopy.** / Every 5 years for a flexible sigmoidoscopy or every 10 years for a colonoscopy beginning at age 35 years and continuing until age 48 years.  Hepatitis C blood test.** / For all people born from [birth year] through 1965 and any individual with known risks for hepatitis C.  Skin self-exam. / Monthly.  Influenza vaccine. / Every year.  Tetanus, diphtheria, and acellular pertussis (Tdap/Td) vaccine.** / Consult your health care provider. Pregnant women should receive 1 dose of Tdap vaccine during each pregnancy. 1 dose of Td every 10 years.  Varicella vaccine.** / Consult your health care provider. Pregnant females who do not have evidence of immunity should receive the first dose after pregnancy.  Zoster vaccine.** / 1 dose for adults aged 30 years or older.  Measles, mumps, rubella (MMR) vaccine.** / You need at least 1 dose of MMR if you were born in 1957 or later. You may also need a second dose. For females of childbearing age, rubella immunity should be determined. If there is no evidence of immunity, females who are not pregnant should be vaccinated. If there is no evidence of immunity, females who are pregnant should delay immunization until after pregnancy.  Pneumococcal 13-valent conjugate (PCV13) vaccine.** / Consult your health care provider.  Pneumococcal polysaccharide (PPSV23) vaccine.** / 1 to 2 doses if you smoke cigarettes or if you have certain conditions.  Meningococcal vaccine.** /  Consult your health care provider.  Hepatitis A vaccine.** / Consult your health care provider.  Hepatitis B vaccine.** / Consult your health care provider.  Haemophilus influenzae type b (Hib) vaccine.** / Consult your health care provider. Ages 64 years and over  Blood pressure check.** / Every year.  Lipid and cholesterol check.** / Every 5 years beginning at age 23 years.  Lung cancer screening. / Every year if you  are aged 16-80 years and have a 30-pack-year history of smoking and currently smoke or have quit within the past 15 years. Yearly screening is stopped once you have quit smoking for at least 15 years or develop a health problem that would prevent you from having lung cancer treatment.  Clinical breast exam.** / Every year after age 74 years.  BRCA-related cancer risk assessment.** / For women who have family members with a BRCA-related cancer (breast, ovarian, tubal, or peritoneal cancers).  Mammogram.** / Every year beginning at age 44 years and continuing for as long as you are in good health. Consult with your health care provider.  Pap test.** / Every 3 years starting at age 58 years through age 22 or 39 years with 3 consecutive normal Pap tests. Testing can be stopped between 65 and 70 years with 3 consecutive normal Pap tests and no abnormal Pap or HPV tests in the past 10 years.  HPV screening.** / Every 3 years from ages 64 years through ages 70 or 61 years with a history of 3 consecutive normal Pap tests. Testing can be stopped between 65 and 70 years with 3 consecutive normal Pap tests and no abnormal Pap or HPV tests in the past 10 years.  Fecal occult blood test (FOBT) of stool. / Every year beginning at age 40 years and continuing until age 27 years. You may not need to do this test if you get a colonoscopy every 10 years.  Flexible sigmoidoscopy or colonoscopy.** / Every 5 years for a flexible sigmoidoscopy or every 10 years for a colonoscopy beginning at age 7 years and continuing until age 32 years.  Hepatitis C blood test.** / For all people born from [birth year] through 1965 and any individual with known risks for hepatitis C.  Osteoporosis screening.** / A one-time screening for women ages 30 years and over and women at risk for fractures or osteoporosis.  Skin self-exam. / Monthly.  Influenza vaccine. / Every year.  Tetanus, diphtheria, and acellular pertussis (Tdap/Td)  vaccine.** / 1 dose of Td every 10 years.  Varicella vaccine.** / Consult your health care provider.  Zoster vaccine.** / 1 dose for adults aged 35 years or older.  Pneumococcal 13-valent conjugate (PCV13) vaccine.** / Consult your health care provider.  Pneumococcal polysaccharide (PPSV23) vaccine.** / 1 dose for all adults aged 46 years and older.  Meningococcal vaccine.** / Consult your health care provider.  Hepatitis A vaccine.** / Consult your health care provider.  Hepatitis B vaccine.** / Consult your health care provider.  Haemophilus influenzae type b (Hib) vaccine.** / Consult your health care provider. ** Family history and personal history of risk and conditions may change your health care provider's recommendations.   This information is not intended to replace advice given to you by your health care provider. Make sure you discuss any questions you have with your health care provider.   Document Released: 07/14/2001 Document Revised: 06/08/2014 Document Reviewed: 10/13/2010 Elsevier Interactive Patient Education [employer].

## 2015-10-11 NOTE — Progress Notes (Signed)
Pre visit review using our clinic review tool, if applicable. No additional management support is needed unless otherwise documented below in the visit note. 

## 2015-10-11 NOTE — Progress Notes (Signed)
Subjective:   Tina [name] is a 70 y.o. female who presents for Medicare Annual (Subsequent) preventive examination.  Review of Systems:   Review of Systems  Constitutional: Negative for activity change, appetite change and fatigue.  HENT: Negative for hearing loss, congestion, tinnitus and ear discharge.   Eyes: Negative for visual disturbance (see optho q1y -- vision corrected to 20/20 with glasses).  Respiratory: Negative for cough, chest tightness and shortness of breath.   Cardiovascular: Negative for chest pain, palpitations and leg swelling.  Gastrointestinal: Negative for abdominal pain, diarrhea, constipation and abdominal distention.  Genitourinary: Negative for urgency, frequency, decreased urine volume and difficulty urinating.  Musculoskeletal: Negative for back pain, arthralgias and gait problem.  Skin: Negative for color change, pallor and rash.  Neurological: Negative for dizziness, light-headedness, numbness and headaches.  Hematological: Negative for adenopathy. Does not bruise/bleed easily.  Psychiatric/Behavioral: Negative for suicidal ideas, confusion, sleep disturbance, self-injury, dysphoric mood, decreased concentration and agitation.  Pt is able to read and write and can do all ADLs No risk for falling No abuse/ violence in home           Objective:     Vitals: BP 128/80 mmHg  Pulse 61  Temp(Src) 98 F (36.7 C) (Oral)  Ht 5\' 5"  (1.651 m)  Wt 205 lb 6.4 oz (93.169 kg)  BMI 34.18 kg/m2  SpO2 98%  Body mass index is 34.18 kg/(m^2). BP 128/80 mmHg  Pulse 61  Temp(Src) 98 F (36.7 C) (Oral)  Ht 5\' 5"  (1.651 m)  Wt 205 lb 6.4 oz (93.169 kg)  BMI 34.18 kg/m2  SpO2 98% General appearance: alert, cooperative, appears stated age and no distress Head: Normocephalic, without obvious abnormality, atraumatic Eyes: conjunctivae/corneas clear. PERRL, EOM's intact. Fundi benign. Ears: normal TM's and external ear canals both ears Nose: Nares normal.  Septum midline. Mucosa normal. No drainage or sinus tenderness. Throat: lips, mucosa, and tongue normal; teeth and gums normal Neck: no adenopathy, no carotid bruit, no JVD, supple, symmetrical, trachea midline and thyroid not enlarged, symmetric, no tenderness/mass/nodules Back: symmetric, no curvature. ROM normal. No CVA tenderness. Lungs: clear to auscultation bilaterally Breasts: normal appearance, no masses or tenderness Heart: regular rate and rhythm, S1, S2 normal, no murmur, click, rub or gallop Abdomen: soft, non-tender; bowel sounds normal; no masses,  no organomegaly Pelvic: not indicated; post-menopausal, no abnormal Pap smears in past Extremities: extremities normal, atraumatic, no cyanosis or edema Pulses: 2+ and symmetric Skin: Skin color, texture, turgor normal. No rashes or lesions Lymph nodes: Cervical, supraclavicular, and axillary nodes normal. Neurologic: Alert and oriented X 3, normal strength and tone. Normal symmetric reflexes. Normal coordination and gait Tobacco History  Smoking status  . Former Smoker -- 1.00 packs/day for 30 years  . Quit date: 12/13/2002  Smokeless tobacco  . Former Systems developer  . Quit date: 06/01/2002     Counseling given: Not Answered   Past Medical History  Diagnosis Date  . Hyperlipidemia   . Myocardial infarct ([place]) hx of 2004  . HTN (hypertension)   . Goiter   . Fatty liver   . Wrist fracture    Past Surgical History  Procedure Laterality Date  . Cardiac catheterization  2004    stent  . Sleep study  2012   Family History  Problem Relation Age of Onset  . Coronary artery disease    . Lung cancer    . Ovarian cancer    . Diabetes Maternal Grandmother   . Hypertension Mother   .  Heart disease Mother 33    MI--- died from #3 at 54  . Hypertension Father   . Cancer Father 61    ovarian  . Hypertension Sister   . Hypertension Brother   . Cancer Sister 8    brain tumor   History  Sexual Activity  . Sexual Activity:    . Partners: Male    Outpatient Encounter Prescriptions as of 10/11/2015  Medication Sig  . alendronate (FOSAMAX) 70 MG tablet TAKE 1 TABLET BY MOUTH EVERY 7 DAYS. TAKE WITH A FULL GLASS OF WATER ON AN EMPTY STOMACH  . aspirin 81 MG tablet Take 81 mg by mouth daily.    . Calcium Carbonate-Vit D-Min (CALCIUM 1200 PO) Take 1,200 mg by mouth daily. 1 tab po qd  . Fish Oil OIL 2 tabs po qd   . Multiple Vitamin (MULTIVITAMIN) capsule Take 1 capsule by mouth daily.    . ramipril (ALTACE) 10 MG capsule Take 1 capsule (10 mg total) by mouth daily.  . rosuvastatin (CRESTOR) 20 MG tablet Take 1 tablet (20 mg total) by mouth daily.  . [DISCONTINUED] alendronate (FOSAMAX) 70 MG tablet TAKE 1 TABLET BY MOUTH EVERY 7 DAYS. TAKE WITH A FULL GLASS OF WATER ON AN EMPTY STOMACH  . [DISCONTINUED] CRESTOR 20 MG tablet TAKE 1 TABLET BY MOUTH EVERY DAY  . [DISCONTINUED] ramipril (ALTACE) 10 MG capsule TAKE 1 CAPSULE BY MOUTH DAILY   No facility-administered encounter medications on file as of 10/11/2015.    Activities of Daily Living In your present state of health, do you have any difficulty performing the following activities: 10/11/2015  Hearing? N  Vision? N  Difficulty concentrating or making decisions? N  Walking or climbing stairs? N  Dressing or bathing? N  Doing errands, shopping? N    Patient Care Team: Ann Held, DO as PCP - General Adron Bene, DDS as Consulting Physician (Dentistry) Mardene Speak, DO as Consulting Physician (Optometry) Fay Records, MD as Consulting Physician (Cardiology)    Assessment:    cpe Exercise Activities and Dietary recommendations Current Exercise Habits: The patient does not participate in regular exercise at present, Exercise limited by: None identified  Goals    None     Fall Risk Fall Risk  10/11/2015 06/05/2014 06/05/2014 05/29/2013  Falls in the past year? Yes No No Yes  Number falls in past yr: 1 - - 1  Injury with Fall? Yes - - No   Risk for fall due to : - - - Other (Comment)  Risk for fall due to (comments): - - - Fell over cement while in [place] 2/9 Scores 10/11/2015 06/05/2014 05/29/2013  PHQ - 2 Score 0 0 0     Cognitive Testing mmse 30/30  Immunization History  Administered Date(s) Administered  . Influenza, High Dose Seasonal PF 02/25/2015  . Influenza,inj,Quad PF,36+ Mos 06/05/2014  . Pneumococcal Conjugate-13 02/25/2015  . Pneumococcal Polysaccharide-23 01/03/2014  . Tdap 10/14/2010  . Zoster 10/14/2010   Screening Tests Health Maintenance  Topic Date Due  . MAMMOGRAM  06/24/2015  . INFLUENZA VACCINE  12/31/2015  . Fecal DNA (Cologuard)  06/27/2017  . TETANUS/TDAP  10/13/2020  . DEXA SCAN  Completed  . ZOSTAVAX  Completed  . Hepatitis C Screening  Completed  . PNA vac Low Risk Adult  Completed      Plan:    see AVS During the course of the visit the patient was educated and counseled about  the following appropriate screening and preventive services:   Vaccines to include Pneumoccal, Influenza, Hepatitis B, Td, Zostavax, HCV  Electrocardiogram  Cardiovascular Disease  Colorectal cancer screening  Bone density screening  Diabetes screening  Glaucoma screening  Mammography/PAP  Nutrition counseling   Patient Instructions (the written plan) was given to the patient.  1. Hyperlipidemia LDL goal <100 con't crestor, check labs - Comprehensive metabolic panel; Future - CBC with Differential/Platelet; Future - Lipid panel; Future - POCT urinalysis dipstick; Future - TSH; Future  2. Essential hypertension  Atenolol, stable - Comprehensive metabolic panel; Future - CBC with Differential/Platelet; Future - Lipid panel; Future - POCT urinalysis dipstick; Future - TSH; Future  3. Osteoporosis   - DG Bone Density; Future  4. Encounter for screening mammogram for breast cancer   - MM DIGITAL SCREENING BILATERAL; Future  5. Preventative health  care    6. Former smoker   - Ambulatory Referral for [place], DO  10/11/2015

## 2015-10-14 ENCOUNTER — Other Ambulatory Visit (INDEPENDENT_AMBULATORY_CARE_PROVIDER_SITE_OTHER): Payer: Medicare Other

## 2015-10-14 DIAGNOSIS — I10 Essential (primary) hypertension: Secondary | ICD-10-CM | POA: Diagnosis not present

## 2015-10-14 DIAGNOSIS — E785 Hyperlipidemia, unspecified: Secondary | ICD-10-CM | POA: Diagnosis not present

## 2015-10-14 LAB — COMPREHENSIVE METABOLIC PANEL
ALT: 17 U/L (ref 0–35)
AST: 20 U/L (ref 0–37)
Albumin: 4.4 g/dL (ref 3.5–5.2)
Alkaline Phosphatase: 37 U/L — ABNORMAL LOW (ref 39–117)
BILIRUBIN TOTAL: 0.5 mg/dL (ref 0.2–1.2)
BUN: 17 mg/dL (ref 6–23)
CO2: 30 meq/L (ref 19–32)
Calcium: 9.6 mg/dL (ref 8.4–10.5)
Chloride: 105 mEq/L (ref 96–112)
Creatinine, Ser: 0.67 mg/dL (ref 0.40–1.20)
GFR: 92.86 mL/min (ref >60.00)
GLUCOSE: 109 mg/dL — AB (ref 70–99)
POTASSIUM: 3.9 meq/L (ref 3.5–5.1)
Sodium: 141 mEq/L (ref 135–145)
Total Protein: 7 g/dL (ref 6.0–8.3)

## 2015-10-14 LAB — CBC WITH DIFFERENTIAL/PLATELET
BASOS PCT: 0.6 % (ref 0.0–3.0)
Basophils Absolute: 0 10*3/uL (ref 0.0–0.1)
EOS PCT: 2.4 % (ref 0.0–5.0)
Eosinophils Absolute: 0.2 10*3/uL (ref 0.0–0.7)
HCT: 40.4 % (ref 36.0–46.0)
Hemoglobin: 13.6 g/dL (ref 12.0–15.0)
LYMPHS ABS: 2.6 10*3/uL (ref 0.7–4.0)
Lymphocytes Relative: 38.5 % (ref 12.0–46.0)
MCHC: 33.6 g/dL (ref 30.0–36.0)
MCV: 90.3 fl (ref 78.0–100.0)
MONOS PCT: 7.3 % (ref 3.0–12.0)
Monocytes Absolute: 0.5 10*3/uL (ref 0.1–1.0)
NEUTROS ABS: 3.4 10*3/uL (ref 1.4–7.7)
NEUTROS PCT: 51.2 % (ref 43.0–77.0)
PLATELETS: 259 10*3/uL (ref 150.0–400.0)
RBC: 4.47 Mil/uL (ref 3.87–5.11)
RDW: 13.6 % (ref 11.5–15.5)
WBC: 6.7 10*3/uL (ref 4.0–10.5)

## 2015-10-14 LAB — POCT URINALYSIS DIPSTICK
Bilirubin, UA: NEGATIVE
Blood, UA: NEGATIVE
Glucose, UA: NEGATIVE
KETONES UA: NEGATIVE
LEUKOCYTES UA: NEGATIVE
Nitrite, UA: NEGATIVE
PH UA: 6
PROTEIN UA: NEGATIVE
Spec Grav, UA: >=1.03
UROBILINOGEN UA: 0.2

## 2015-10-14 LAB — LIPID PANEL
Cholesterol: 155 mg/dL (ref 0–200)
HDL: 64.3 mg/dL (ref >39.00)
LDL Cholesterol: 76 mg/dL (ref 0–99)
NONHDL: 90.72
Total CHOL/HDL Ratio: 2
Triglycerides: 72 mg/dL (ref 0.0–149.0)
VLDL: 14.4 mg/dL (ref 0.0–40.0)

## 2015-10-14 LAB — TSH: TSH: 1.62 u[IU]/mL (ref 0.35–4.50)

## 2015-10-17 NOTE — Progress Notes (Signed)
Quick Note:  Pt has seen results on MyChart and message also sent for patient to call back if any questions. ______ 

## 2015-10-17 NOTE — Addendum Note (Signed)
Addended by: Tasia Catchings on: 10/17/2015 06:17 PM   Modules accepted: Orders

## 2015-10-29 ENCOUNTER — Ambulatory Visit
Admission: RE | Admit: 2015-10-29 | Discharge: 2015-10-29 | Disposition: A | Discharge status: Home / Self Care | Payer: Medicare Other | Source: Ambulatory Visit | Attending: Family Medicine | Admitting: Family Medicine

## 2015-10-29 DIAGNOSIS — M810 Age-related osteoporosis without current pathological fracture: Secondary | ICD-10-CM

## 2015-10-29 DIAGNOSIS — Z780 Asymptomatic menopausal state: Secondary | ICD-10-CM | POA: Diagnosis not present

## 2015-10-29 DIAGNOSIS — M85851 Other specified disorders of bone density and structure, right thigh: Secondary | ICD-10-CM | POA: Diagnosis not present

## 2015-10-29 DIAGNOSIS — Z1231 Encounter for screening mammogram for malignant neoplasm of breast: Secondary | ICD-10-CM | POA: Diagnosis not present

## 2015-12-26 ENCOUNTER — Other Ambulatory Visit: Payer: Self-pay | Admitting: Family Medicine

## 2015-12-26 DIAGNOSIS — M25512 Pain in left shoulder: Secondary | ICD-10-CM

## 2016-01-16 ENCOUNTER — Ambulatory Visit (HOSPITAL_BASED_OUTPATIENT_CLINIC_OR_DEPARTMENT_OTHER)
Admission: RE | Admit: 2016-01-16 | Discharge: 2016-01-16 | Disposition: A | Discharge status: Home / Self Care | Payer: Medicare Other | Source: Ambulatory Visit | Attending: Medical | Admitting: Medical

## 2016-01-16 ENCOUNTER — Encounter: Payer: Self-pay | Admitting: Medical

## 2016-01-16 ENCOUNTER — Ambulatory Visit (INDEPENDENT_AMBULATORY_CARE_PROVIDER_SITE_OTHER): Payer: Medicare Other | Admitting: Medical

## 2016-01-16 VITALS — BP 140/82 | HR 77 | Temp 98.3°F | Ht 65.0 in | Wt 207.4 lb

## 2016-01-16 DIAGNOSIS — M542 Cervicalgia: Secondary | ICD-10-CM

## 2016-01-16 DIAGNOSIS — M25512 Pain in left shoulder: Secondary | ICD-10-CM

## 2016-01-16 DIAGNOSIS — R937 Abnormal findings on diagnostic imaging of other parts of musculoskeletal system: Secondary | ICD-10-CM | POA: Diagnosis not present

## 2016-01-16 NOTE — Patient Instructions (Addendum)
For your shoulder pain with reduced range of motion will get xray of shoulder. If needed use alleve otc 1-2 tab twice daily. With duration of pain I think sports medicine referral with possible PT may be beneficial. Your restricted range of motion  causes some concern for rotator cuff injury.  For intermittent neck pain and tingling of left hand will go ahead and get cervical spine xray.  Will update on xray findings when resulted.  Follow up date with [place] to be determined.

## 2016-01-16 NOTE — Progress Notes (Signed)
Pre visit review using our clinic tool,if applicable. No additional management support is needed unless otherwise documented below in the visit note.  

## 2016-01-16 NOTE — Progress Notes (Signed)
Subjective:    Patient ID: Tina [name], female    DOB: 11/05/1945, 70 y.o.   MRN: [id number]  HPI   Pt reports for couple of weeks she has ben having some left shoulder pain. Pain worse in shoulder with movement. No fall or injury. No neck pain. Pain with movement definitley more prominent. Pt easily with abduction. On chest pain. Some pain from shoulder down tricep area but none in elbow.  Pt not taking any medications for pain.   Hx of occasional neck pain. Not daily. Some tingling sensation to her left hand recently.   Review of Systems  Constitutional: Negative for chills, fatigue and fever.  Respiratory: Negative for cough, shortness of breath and wheezing.   Cardiovascular: Negative for chest pain and palpitations.  Musculoskeletal: Positive for neck pain. Negative for back pain and neck stiffness.       Shoulder pain. Hx of some neck pain. With shoulder pain notes intermittent tingling of finger left hand.  Skin: Negative for rash.  Psychiatric/Behavioral: Negative for behavioral problems and confusion.    Past Medical History:  Diagnosis Date  . Fatty liver   . Goiter   . HTN (hypertension)   . Hyperlipidemia   . Myocardial infarct ([place]) hx of 2004  . Wrist fracture      Social History   Social History  . Marital status: Divorced    Spouse name: N/A  . Number of children: N/A  . Years of education: N/A   Occupational History  . nurse Other    [employer]    Social History Main Topics  . Smoking status: Former Smoker    Packs/day: 1.00    Years: 30.00    Quit date: 12/13/2002  . Smokeless tobacco: Former Systems developer    Quit date: 06/01/2002  . Alcohol use 2.4 oz/week    4 Glasses of wine per week  . Drug use: No  . Sexual activity: Not Currently    Partners: Male   Other Topics Concern  . Not on file   Social History Narrative   Occupation:[employer]   Divorced   Former Smoker quit 2004   Drug use-no   Regular exercise-treadmill 3x a week     Past Surgical History:  Procedure Laterality Date  . CARDIAC CATHETERIZATION  2004   stent  . sleep study  2012    Family History  Problem Relation Age of Onset  . Coronary artery disease    . Lung cancer    . Ovarian cancer    . Diabetes Maternal Grandmother   . Hypertension Mother   . Heart disease Mother 51    MI--- died from #3 at 35  . Hypertension Father   . Cancer Father 25    ovarian  . Hypertension Sister   . Hypertension Brother   . Cancer Sister 72    brain tumor    No Known Allergies  Current Outpatient Prescriptions on File Prior to Visit  Medication Sig Dispense Refill  . alendronate (FOSAMAX) 70 MG tablet TAKE 1 TABLET BY MOUTH EVERY 7 DAYS. TAKE WITH A FULL GLASS OF WATER ON AN EMPTY STOMACH 4 tablet 5  . aspirin 81 MG tablet Take 81 mg by mouth daily.      . Calcium Carbonate-Vit D-Min (CALCIUM 1200 PO) Take 1,200 mg by mouth daily. 1 tab po qd    . Fish Oil OIL 2 tabs po qd     . Multiple Vitamin (MULTIVITAMIN) capsule Take  1 capsule by mouth daily.      . ramipril (ALTACE) 10 MG capsule Take 1 capsule (10 mg total) by mouth daily. 30 capsule 5  . rosuvastatin (CRESTOR) 20 MG tablet TAKE 1 TABLET BY MOUTH EVERY DAY 90 tablet 1   No current facility-administered medications on file prior to visit.     BP 140/82   Pulse 77   Temp 98.3 F (36.8 C) (Oral)   Ht 5\' 5"  (1.651 m)   Wt 207 lb 6.4 oz (94.1 kg)   SpO2 97%   BMI 34.51 kg/m       Objective:   Physical Exam  General- No acute distress. Pleasant patient. Neck- Full range of motion, no jvd. No mid cspine tenderness. On rotation of neck no radicular pain. Lungs- Clear, even and unlabored. Heart- regular rate and rhythm. Neurologic- CNII- XII grossly intact.  Lt shoulder- pain on attempted abduction. But can bring left arm above shoulder level. Pain on lateral aspect on attempted abduction. On palpation anterior aspect mild tender.   Left arm- no pain on palpation of humerus area  or tricep. Lt elbow- good rom.  Left hand- good grip. Normal sensation. Sharp and dull discrimination.           Assessment & Plan:  For your shoulder pain with reduced range of motion will get xray of shoulder. If needed use alleve otc 1-2 tab twice daily. With duration of pain I think sports medicine referral with possible PT may be beneficial. Your restricted range of motion  causes some concern for rotator cuff injury.  For intermittent neck pain and tingling of left hand will go ahead and get cervical spine xray.  Will update on xray findings when resulted.  Follow up date with [place] to be determined.  Saben Donigan, Percell Miller, PA-C

## 2016-01-17 NOTE — Addendum Note (Signed)
Addended by: Tasia Catchings on: 01/17/2016 08:42 AM   Modules accepted: Orders

## 2016-01-20 ENCOUNTER — Ambulatory Visit: Payer: Medicare Other | Admitting: Family Medicine

## 2016-01-21 ENCOUNTER — Ambulatory Visit (INDEPENDENT_AMBULATORY_CARE_PROVIDER_SITE_OTHER): Payer: Medicare Other | Admitting: Family Medicine

## 2016-01-21 ENCOUNTER — Encounter: Payer: Self-pay | Admitting: Family Medicine

## 2016-01-21 DIAGNOSIS — M25512 Pain in left shoulder: Secondary | ICD-10-CM | POA: Diagnosis not present

## 2016-01-21 NOTE — Patient Instructions (Signed)
You have rotator cuff impingement Try to avoid painful activities (overhead activities, lifting with extended arm) as much as possible. Tylenol 500mg  1-2 tabs three times a day as needed for pain. Consider Aleve 2 tabs twice a day with food OR ibuprofen 3 tabs three times a day with food for pain and inflammation (be wary of this as it can increase blood pressure, risk of heart attack, irritation of stomach and kidneys especially if taken long term). Subacromial injection may be beneficial to help with pain and to decrease inflammation. Consider physical therapy with transition to home exercise program. Do home exercise program with theraband and scapular stabilization exercises daily - these are very important for long term relief.  3 sets of 10 once a day. If not improving at follow-up we will consider further imaging, injection, physical therapy, and/or nitro patches. Follow up with me in 5-6 weeks.

## 2016-01-22 DIAGNOSIS — M25512 Pain in left shoulder: Secondary | ICD-10-CM | POA: Insufficient documentation

## 2016-01-22 NOTE — Assessment & Plan Note (Signed)
2/2 rotator cuff impingement.  Shown home exercise program to do daily.  Tylenol with nsaids if needed.  Discussed physical therapy, injection, nitro patches as well.  Will consider these if not improving at f/u in 5-6 weeks.

## 2016-01-22 NOTE — Progress Notes (Signed)
PCP and consultation requested by: Ann Held, DO  Subjective:   HPI: Patient is a 70 y.o. female here for left shoulder pain.  Patient denies known injury or trauma. She states she started to get pain in lateral left shoulder about 3 weeks ago. Difficulty sleeping lying on this shoulder. Pain currently is 1/10, a discomfort, achy. Worse with overhead motions, reaching. No prior issues with this shoulder. Is right handed. Reports intermittent tingling of all fingers on left hand also. No skin changes.  Past Medical History:  Diagnosis Date  . Fatty liver   . Goiter   . HTN (hypertension)   . Hyperlipidemia   . Myocardial infarct ([place]) hx of 2004  . Wrist fracture     Current Outpatient Prescriptions on File Prior to Visit  Medication Sig Dispense Refill  . alendronate (FOSAMAX) 70 MG tablet TAKE 1 TABLET BY MOUTH EVERY 7 DAYS. TAKE WITH A FULL GLASS OF WATER ON AN EMPTY STOMACH 4 tablet 5  . aspirin 81 MG tablet Take 81 mg by mouth daily.      . Calcium Carbonate-Vit D-Min (CALCIUM 1200 PO) Take 1,200 mg by mouth daily. 1 tab po qd    . Fish Oil OIL 2 tabs po qd     . Multiple Vitamin (MULTIVITAMIN) capsule Take 1 capsule by mouth daily.      . ramipril (ALTACE) 10 MG capsule Take 1 capsule (10 mg total) by mouth daily. 30 capsule 5  . rosuvastatin (CRESTOR) 20 MG tablet TAKE 1 TABLET BY MOUTH EVERY DAY 90 tablet 1   No current facility-administered medications on file prior to visit.     Past Surgical History:  Procedure Laterality Date  . CARDIAC CATHETERIZATION  2004   stent  . sleep study  2012    No Known Allergies  Social History   Social History  . Marital status: Divorced    Spouse name: N/A  . Number of children: N/A  . Years of education: N/A   Occupational History  . nurse Other    [employer]    Social History Main Topics  . Smoking status: Former Smoker    Packs/day: 1.00    Years: 30.00    Quit date: 12/13/2002  . Smokeless  tobacco: Former Systems developer    Quit date: 06/01/2002  . Alcohol use 2.4 oz/week    4 Glasses of wine per week  . Drug use: No  . Sexual activity: Not Currently    Partners: Male   Other Topics Concern  . Not on file   Social History Narrative   Occupation:[employer]   Divorced   Former Smoker quit 2004   Drug use-no   Regular exercise-treadmill 3x a week    Family History  Problem Relation Age of Onset  . Coronary artery disease    . Lung cancer    . Ovarian cancer    . Diabetes Maternal Grandmother   . Hypertension Mother   . Heart disease Mother 67    MI--- died from #3 at 86  . Hypertension Father   . Cancer Father 41    ovarian  . Hypertension Sister   . Hypertension Brother   . Cancer Sister 56    brain tumor    BP 127/64   Pulse 66   Ht 5\' 5"  (1.651 m)   Wt 207 lb (93.9 kg)   BMI 34.45 kg/m   Review of Systems: See HPI above.    Objective:  Physical Exam:  Gen: NAD, comfortable in exam room  Left shoulder: No swelling, ecchymoses.  No gross deformity. No TTP. FROM with mild painful arc. Positive Hawkins, Neers. Negative Speeds, Yergasons. Strength 5/5 with empty can and resisted internal/external rotation.  Mild pain empty can. Negative apprehension. NV intact distally.  Right shoulder: FROM without pain.    Assessment & Plan:  1. Left shoulder pain - 2/2 rotator cuff impingement.  Shown home exercise program to do daily.  Tylenol with nsaids if needed.  Discussed physical therapy, injection, nitro patches as well.  Will consider these if not improving at f/u in 5-6 weeks.

## 2016-03-09 ENCOUNTER — Other Ambulatory Visit: Payer: Self-pay | Admitting: Family Medicine

## 2016-04-06 ENCOUNTER — Other Ambulatory Visit: Payer: Self-pay | Admitting: Family Medicine

## 2016-04-13 ENCOUNTER — Ambulatory Visit (INDEPENDENT_AMBULATORY_CARE_PROVIDER_SITE_OTHER): Payer: Medicare Other | Admitting: Family Medicine

## 2016-04-13 ENCOUNTER — Encounter: Payer: Self-pay | Admitting: Family Medicine

## 2016-04-13 VITALS — BP 118/82 | HR 84 | Temp 98.4°F | Resp 16 | Ht 65.0 in | Wt 204.2 lb

## 2016-04-13 DIAGNOSIS — Z23 Encounter for immunization: Secondary | ICD-10-CM | POA: Diagnosis not present

## 2016-04-13 DIAGNOSIS — E785 Hyperlipidemia, unspecified: Secondary | ICD-10-CM | POA: Diagnosis not present

## 2016-04-13 DIAGNOSIS — I10 Essential (primary) hypertension: Secondary | ICD-10-CM | POA: Diagnosis not present

## 2016-04-13 LAB — COMPREHENSIVE METABOLIC PANEL
ALT: 22 U/L (ref 0–35)
AST: 26 U/L (ref 0–37)
Albumin: 4.8 g/dL (ref 3.5–5.2)
Alkaline Phosphatase: 49 U/L (ref 39–117)
BUN: 24 mg/dL — ABNORMAL HIGH (ref 6–23)
CALCIUM: 10.2 mg/dL (ref 8.4–10.5)
CHLORIDE: 99 meq/L (ref 96–112)
CO2: 28 meq/L (ref 19–32)
CREATININE: 0.69 mg/dL (ref 0.40–1.20)
GFR: 89.63 mL/min (ref >60.00)
Glucose, Bld: 89 mg/dL (ref 70–99)
POTASSIUM: 4.7 meq/L (ref 3.5–5.1)
SODIUM: 138 meq/L (ref 135–145)
Total Bilirubin: 0.7 mg/dL (ref 0.2–1.2)
Total Protein: 8 g/dL (ref 6.0–8.3)

## 2016-04-13 LAB — POCT URINALYSIS DIPSTICK
Bilirubin, UA: NEGATIVE
Blood, UA: NEGATIVE
GLUCOSE UA: NEGATIVE
LEUKOCYTES UA: NEGATIVE
NITRITE UA: NEGATIVE
UROBILINOGEN UA: 0.2
pH, UA: 6

## 2016-04-13 LAB — LIPID PANEL
CHOL/HDL RATIO: 2
CHOLESTEROL: 191 mg/dL (ref 0–200)
HDL: 81.3 mg/dL (ref >39.00)
LDL Cholesterol: 89 mg/dL (ref 0–99)
NonHDL: 109.7
TRIGLYCERIDES: 103 mg/dL (ref 0.0–149.0)
VLDL: 20.6 mg/dL (ref 0.0–40.0)

## 2016-04-13 NOTE — Progress Notes (Signed)
Subjective:    Patient ID: Tina [name], female    DOB: September 22, 1945, 70 y.o.   MRN: [id number]  Chief Complaint  Patient presents with  . Hyperlipidemia    follow up    HPI Patient is in today for f/u bp and labs.  She c/o leg cramps and mustard helps.     Past Medical History:  Diagnosis Date  . Fatty liver   . Goiter   . HTN (hypertension)   . Hyperlipidemia   . Myocardial infarct hx of 2004  . Wrist fracture     Past Surgical History:  Procedure Laterality Date  . CARDIAC CATHETERIZATION  2004   stent  . sleep study  2012    Family History  Problem Relation Age of Onset  . Coronary artery disease    . Lung cancer    . Ovarian cancer    . Diabetes Maternal Grandmother   . Hypertension Mother   . Heart disease Mother 54    MI--- died from #3 at 2  . Hypertension Father   . Cancer Father 71    ovarian  . Hypertension Sister   . Hypertension Brother   . Cancer Sister 33    brain tumor    Social History   Social History  . Marital status: Divorced    Spouse name: N/A  . Number of children: N/A  . Years of education: N/A   Occupational History  . nurse Other    [employer]    Social History Main Topics  . Smoking status: Former Smoker    Packs/day: 1.00    Years: 30.00    Quit date: 12/13/2002  . Smokeless tobacco: Former Systems developer    Quit date: 06/01/2002  . Alcohol use 2.4 oz/week    4 Glasses of wine per week  . Drug use: No  . Sexual activity: Not Currently    Partners: Male   Other Topics Concern  . Not on file   Social History Narrative   Occupation:[employer]   Divorced   Former Smoker quit 2004   Drug use-no   Regular exercise-treadmill 3x a week    Outpatient Medications Prior to Visit  Medication Sig Dispense Refill  . alendronate (FOSAMAX) 70 MG tablet TAKE 1 TABLET BY MOUTH EVERY 7 DAYS. TAKE WITH A FULL GLASS OF WATER ON AN EMPTY STOMACH 4 tablet 5  . aspirin 81 MG tablet Take 81 mg by mouth daily.      . Calcium  Carbonate-Vit D-Min (CALCIUM 1200 PO) Take 1,200 mg by mouth daily. 1 tab po qd    . Fish Oil OIL 2 tabs po qd     . Multiple Vitamin (MULTIVITAMIN) capsule Take 1 capsule by mouth daily.      . ramipril (ALTACE) 10 MG capsule TAKE 1 CAPSULE BY MOUTH DAILY 90 capsule 2  . rosuvastatin (CRESTOR) 20 MG tablet TAKE 1 TABLET BY MOUTH EVERY DAY 90 tablet 1   No facility-administered medications prior to visit.     No Known Allergies  Review of Systems  Constitutional: Negative for chills, fever and malaise/fatigue.  HENT: Negative for congestion and hearing loss.   Eyes: Negative for discharge.  Respiratory: Negative for cough, sputum production and shortness of breath.   Cardiovascular: Negative for chest pain, palpitations and leg swelling.  Gastrointestinal: Negative for abdominal pain, blood in stool, constipation, diarrhea, heartburn, nausea and vomiting.  Genitourinary: Negative for dysuria, frequency, hematuria and urgency.  Musculoskeletal: Negative for  back pain, falls and myalgias.  Skin: Negative for rash.  Neurological: Negative for dizziness, sensory change, loss of consciousness, weakness and headaches.  Endo/Heme/Allergies: Negative for environmental allergies. Does not bruise/bleed easily.  Psychiatric/Behavioral: Negative for depression and suicidal ideas. The patient is not nervous/anxious and does not have insomnia.        Objective:    Physical Exam  Constitutional: She is oriented to person, place, and time. She appears well-developed and well-nourished.  HENT:  Head: Normocephalic and atraumatic.  Eyes: Conjunctivae and EOM are normal.  Neck: Normal range of motion. Neck supple. No JVD present. Carotid bruit is not present. No thyromegaly present.  Cardiovascular: Normal rate, regular rhythm and normal heart sounds.   No murmur heard. Pulmonary/Chest: Effort normal and breath sounds normal. No respiratory distress. She has no wheezes. She has no rales. She  exhibits no tenderness.  Musculoskeletal: She exhibits no edema.  Neurological: She is alert and oriented to person, place, and time.  Psychiatric: She has a normal mood and affect. Her behavior is normal. Judgment and thought content normal.  Nursing note and vitals reviewed.   BP 118/82 (BP Location: Right Arm, Patient Position: Sitting, Cuff Size: Large)   Pulse 84   Temp 98.4 F (36.9 C) (Oral)   Resp 16   Ht 5\' 5"  (1.651 m)   Wt 204 lb 3.2 oz (92.6 kg)   SpO2 97%   BMI 33.98 kg/m  Wt Readings from Last 3 Encounters:  04/13/16 204 lb 3.2 oz (92.6 kg)  01/21/16 207 lb (93.9 kg)  01/16/16 207 lb 6.4 oz (94.1 kg)     Lab Results  Component Value Date   WBC 6.7 10/14/2015   HGB 13.6 10/14/2015   HCT 40.4 10/14/2015   PLT 259.0 10/14/2015   GLUCOSE 89 04/13/2016   CHOL 191 04/13/2016   TRIG 103.0 04/13/2016   HDL 81.30 04/13/2016   LDLDIRECT 123.7 05/30/2013   LDLCALC 89 04/13/2016   ALT 22 04/13/2016   AST 26 04/13/2016   NA 138 04/13/2016   K 4.7 04/13/2016   CL 99 04/13/2016   CREATININE 0.69 04/13/2016   BUN 24 (H) 04/13/2016   CO2 28 04/13/2016   TSH 1.62 10/14/2015    Lab Results  Component Value Date   TSH 1.62 10/14/2015   Lab Results  Component Value Date   WBC 6.7 10/14/2015   HGB 13.6 10/14/2015   HCT 40.4 10/14/2015   MCV 90.3 10/14/2015   PLT 259.0 10/14/2015   Lab Results  Component Value Date   NA 138 04/13/2016   K 4.7 04/13/2016   CO2 28 04/13/2016   GLUCOSE 89 04/13/2016   BUN 24 (H) 04/13/2016   CREATININE 0.69 04/13/2016   BILITOT 0.7 04/13/2016   ALKPHOS 49 04/13/2016   AST 26 04/13/2016   ALT 22 04/13/2016   PROT 8.0 04/13/2016   ALBUMIN 4.8 04/13/2016   CALCIUM 10.2 04/13/2016   GFR 89.63 04/13/2016   Lab Results  Component Value Date   CHOL 191 04/13/2016   Lab Results  Component Value Date   HDL 81.30 04/13/2016   Lab Results  Component Value Date   LDLCALC 89 04/13/2016   Lab Results  Component Value  Date   TRIG 103.0 04/13/2016   Lab Results  Component Value Date   CHOLHDL 2 04/13/2016   No results found for: HGBA1C     Assessment & Plan:   Problem List Items Addressed This Visit  Unprioritized   Essential hypertension - Primary    Stable con't meds Check labs      Relevant Medications   rosuvastatin (CRESTOR) 20 MG tablet   ramipril (ALTACE) 10 MG capsule   Hyperlipidemia LDL goal <100    con't meds Check labs      Relevant Medications   rosuvastatin (CRESTOR) 20 MG tablet   ramipril (ALTACE) 10 MG capsule      I have changed Ms. Grow's rosuvastatin and ramipril. I am also having her maintain her aspirin, Calcium Carbonate-Vit D-Min (CALCIUM 1200 PO), Fish Oil, multivitamin, and alendronate.  Meds ordered this encounter  Medications  . rosuvastatin (CRESTOR) 20 MG tablet    Sig: Take 1 tablet (20 mg total) by mouth daily.    Dispense:  90 tablet    Refill:  1  . ramipril (ALTACE) 10 MG capsule    Sig: Take 1 capsule (10 mg total) by mouth daily.    Dispense:  90 capsule    Refill:  2    **Patient requests 90 days supply**     Ann Held, DO

## 2016-04-13 NOTE — Progress Notes (Signed)
Pre visit review using our clinic review tool, if applicable. No additional management support is needed unless otherwise documented below in the visit note. 

## 2016-04-13 NOTE — Patient Instructions (Signed)
Hypertension Hypertension, commonly called high blood pressure, is when the force of blood pumping through your arteries is too strong. Your arteries are the blood vessels that carry blood from your heart throughout your body. A blood pressure reading consists of a higher number over a lower number, such as 110/72. The higher number (systolic) is the pressure inside your arteries when your heart pumps. The lower number (diastolic) is the pressure inside your arteries when your heart relaxes. Ideally you want your blood pressure below 120/80. Hypertension forces your heart to work harder to pump blood. Your arteries may become narrow or stiff. Having untreated or uncontrolled hypertension can cause heart attack, stroke, kidney disease, and other problems. RISK FACTORS Some risk factors for high blood pressure are controllable. Others are not.  Risk factors you cannot control include:   Race. You may be at higher risk if you are African American.  Age. Risk increases with age.  Gender. Men are at higher risk than women before age 45 years. After age 65, women are at higher risk than men. Risk factors you can control include:  Not getting enough exercise or physical activity.  Being overweight.  Getting too much fat, sugar, calories, or salt in your diet.  Drinking too much alcohol. SIGNS AND SYMPTOMS Hypertension does not usually cause signs or symptoms. Extremely high blood pressure (hypertensive crisis) may cause headache, anxiety, shortness of breath, and nosebleed. DIAGNOSIS To check if you have hypertension, your health care provider will measure your blood pressure while you are seated, with your arm held at the level of your heart. It should be measured at least twice using the same arm. Certain conditions can cause a difference in blood pressure between your right and left arms. A blood pressure reading that is higher than normal on one occasion does not mean that you need treatment. If  it is not clear whether you have high blood pressure, you may be asked to return on a different day to have your blood pressure checked again. Or, you may be asked to monitor your blood pressure at home for 1 or more weeks. TREATMENT Treating high blood pressure includes making lifestyle changes and possibly taking medicine. Living a healthy lifestyle can help lower high blood pressure. You may need to change some of your habits. Lifestyle changes may include:  Following the DASH diet. This diet is high in fruits, vegetables, and whole grains. It is low in salt, red meat, and added sugars.  Keep your sodium intake below 2,300 mg per day.  Getting at least 30-45 minutes of aerobic exercise at least 4 times per week.  Losing weight if necessary.  Not smoking.  Limiting alcoholic beverages.  Learning ways to reduce stress. Your health care provider may prescribe medicine if lifestyle changes are not enough to get your blood pressure under control, and if one of the following is true:  You are 18-59 years of age and your systolic blood pressure is above 140.  You are 60 years of age or older, and your systolic blood pressure is above 150.  Your diastolic blood pressure is above 90.  You have diabetes, and your systolic blood pressure is over 140 or your diastolic blood pressure is over 90.  You have kidney disease and your blood pressure is above 140/90.  You have heart disease and your blood pressure is above 140/90. Your personal target blood pressure may vary depending on your medical conditions, your age, and other factors. HOME CARE INSTRUCTIONS    Have your blood pressure rechecked as directed by your health care provider.   Take medicines only as directed by your health care provider. Follow the directions carefully. Blood pressure medicines must be taken as prescribed. The medicine does not work as well when you skip doses. Skipping doses also puts you at risk for  problems.  Do not smoke.   Monitor your blood pressure at home as directed by your health care provider. SEEK MEDICAL CARE IF:   You think you are having a reaction to medicines taken.  You have recurrent headaches or feel dizzy.  You have swelling in your ankles.  You have trouble with your vision. SEEK IMMEDIATE MEDICAL CARE IF:  You develop a severe headache or confusion.  You have unusual weakness, numbness, or feel faint.  You have severe chest or abdominal pain.  You vomit repeatedly.  You have trouble breathing. MAKE SURE YOU:   Understand these instructions.  Will watch your condition.  Will get help right away if you are not doing well or get worse.   This information is not intended to replace advice given to you by your health care provider. Make sure you discuss any questions you have with your health care provider.   Document Released: 05/18/2005 Document Revised: 10/02/2014 Document Reviewed: 03/10/2013 Elsevier Interactive Patient Education 2016 Elsevier Inc.  

## 2016-04-19 MED ORDER — ROSUVASTATIN CALCIUM 20 MG PO TABS: 20.0000 mg | ORAL_TABLET | Freq: Every day | ORAL | 1 refills | Status: DC

## 2016-04-19 MED ORDER — RAMIPRIL 10 MG PO CAPS: 10.0000 mg | ORAL_CAPSULE | Freq: Every day | ORAL | 2 refills | Status: DC

## 2016-04-19 NOTE — Assessment & Plan Note (Signed)
con't meds  Check labs 

## 2016-04-19 NOTE — Assessment & Plan Note (Signed)
Stable con't meds Check labs 

## 2016-08-03 DIAGNOSIS — H2513 Age-related nuclear cataract, bilateral: Secondary | ICD-10-CM | POA: Diagnosis not present

## 2016-08-03 DIAGNOSIS — H02831 Dermatochalasis of right upper eyelid: Secondary | ICD-10-CM | POA: Diagnosis not present

## 2016-08-03 DIAGNOSIS — H5213 Myopia, bilateral: Secondary | ICD-10-CM | POA: Diagnosis not present

## 2016-08-03 DIAGNOSIS — H40023 Open angle with borderline findings, high risk, bilateral: Secondary | ICD-10-CM | POA: Diagnosis not present

## 2016-08-03 DIAGNOSIS — H02834 Dermatochalasis of left upper eyelid: Secondary | ICD-10-CM | POA: Diagnosis not present

## 2016-09-16 ENCOUNTER — Other Ambulatory Visit: Payer: Self-pay | Admitting: Family Medicine

## 2016-09-24 ENCOUNTER — Telehealth: Payer: Self-pay

## 2016-09-24 ENCOUNTER — Encounter: Payer: Self-pay | Admitting: Family Medicine

## 2016-09-24 ENCOUNTER — Ambulatory Visit (INDEPENDENT_AMBULATORY_CARE_PROVIDER_SITE_OTHER): Payer: Medicare Other | Admitting: Family Medicine

## 2016-09-24 VITALS — BP 133/75 | HR 110 | Temp 98.6°F | Resp 16 | Ht 65.0 in | Wt 189.4 lb

## 2016-09-24 DIAGNOSIS — N950 Postmenopausal bleeding: Secondary | ICD-10-CM

## 2016-09-24 DIAGNOSIS — B354 Tinea corporis: Secondary | ICD-10-CM

## 2016-09-24 DIAGNOSIS — R300 Dysuria: Secondary | ICD-10-CM

## 2016-09-24 LAB — POC URINALSYSI DIPSTICK (AUTOMATED)
Bilirubin, UA: NEGATIVE
GLUCOSE UA: NEGATIVE
Leukocytes, UA: NEGATIVE
NITRITE UA: NEGATIVE
PROTEIN UA: NEGATIVE
RBC UA: NEGATIVE
Spec Grav, UA: 1.015 (ref 1.010–1.025)
UROBILINOGEN UA: 0.2 U/dL
pH, UA: 6 (ref 5.0–8.0)

## 2016-09-24 MED ORDER — NAFTIFINE HCL 1 % EX CREA: TOPICAL_CREAM | Freq: Every day | CUTANEOUS | 0 refills | Status: DC

## 2016-09-24 NOTE — Telephone Encounter (Signed)
Pt has an appt today (09/24/16) at 2:45pm for Vaginal Spotting in Menopause Stage.  Called to speak with patient about her concern.  Pt may need GYN referral.  Left a message for call back.

## 2016-09-24 NOTE — Progress Notes (Signed)
Pre visit review using our clinic review tool, if applicable. No additional management support is needed unless otherwise documented below in the visit note. 

## 2016-09-24 NOTE — Telephone Encounter (Signed)
Pt made aware

## 2016-09-24 NOTE — Patient Instructions (Signed)

## 2016-09-24 NOTE — Telephone Encounter (Signed)
Called to follow up with patient.  Pt states spotting started 2 days ago (Tuesday, 09/22/16).  States it's "pink/yellow" in color.  Watery, thin.  Does not soak through maxi pad.  Slight odor.  She denied abdominal pain/swelling, n/v, fever, back pain.  States there was some burning on urination when bleeding first started on Tuesday, but none today.  She does have a hx. Bladder leakage.  She also mentioned increased bloating, but states she stays "pretty gassy."  No recent sexual encounters.  While on the phone, she also mentioned that she has a rash under her abdominal fold that she believes could be due sweating.  Says there is some drainage coming from the rash as well.  She has applied neosporin and would like to know if Dr. Etter Sjogren recommends anything else.  Dr. Carmelina Noun advise.

## 2016-09-24 NOTE — Telephone Encounter (Signed)
Patient called back returning call about her appointment. Plse call back

## 2016-09-24 NOTE — Progress Notes (Signed)
Patient ID: Tina [name], female   DOB: December 05, 1945, 71 y.o.   MRN: [id number]    Subjective:  I acted as a Education administrator for Dr. Carollee Herter.  Guerry Bruin, [place]   Patient ID: Tina [name], female    DOB: 1945/10/20, 71 y.o.   MRN: [id number]  Chief Complaint  Patient presents with  . Vaginal Bleeding  . Rash    HPI  Patient is in today for vaginal bleeding and rash lower abdomen.  She states spotting started 2 days ago (Tuesday, 09/22/16).  States it's "pink/yellow" in color.  Watery, thin.  Does not soak through maxi pad.  Slight odor.  She denied abdominal pain/swelling, n/v, fever, back pain.  States there was some burning on urination when bleeding first started on Tuesday, but none today.  She does have a hx. Bladder leakage.  She also mentioned increased bloating, but states she stays "pretty gassy."  No recent sexual encounters.  has a rash under her abdominal fold that she believes could be due sweating.  Says there is some drainage coming from the rash as well.  She has applied neosporin.  Patient Care Team: Ann Held, DO as PCP - General Adron Bene, DDS as Consulting Physician (Dentistry) Shawnie Dapper, DO as Consulting Physician (Optometry) Fay Records, MD as Consulting Physician (Cardiology)   Past Medical History:  Diagnosis Date  . Fatty liver   . Goiter   . HTN (hypertension)   . Hyperlipidemia   . Myocardial infarct ([place]) hx of 2004  . Wrist fracture     Past Surgical History:  Procedure Laterality Date  . CARDIAC CATHETERIZATION  2004   stent  . sleep study  2012    Family History  Problem Relation Age of Onset  . Coronary artery disease    . Lung cancer    . Ovarian cancer    . Diabetes Maternal Grandmother   . Hypertension Mother   . Heart disease Mother 40    MI--- died from #3 at 39  . Hypertension Father   . Cancer Father 33    ovarian  . Hypertension Sister   . Hypertension Brother   . Cancer Sister 50    brain tumor    Social History    Social History  . Marital status: Divorced    Spouse name: N/A  . Number of children: N/A  . Years of education: N/A   Occupational History  . nurse Other    [employer]    Social History Main Topics  . Smoking status: Former Smoker    Packs/day: 1.00    Years: 30.00    Quit date: 12/13/2002  . Smokeless tobacco: Former Systems developer    Quit date: 06/01/2002  . Alcohol use 2.4 oz/week    4 Glasses of wine per week  . Drug use: No  . Sexual activity: Not Currently    Partners: Male   Other Topics Concern  . Not on file   Social History Narrative   Occupation:[employer]   Divorced   Former Smoker quit 2004   Drug use-no   Regular exercise-treadmill 3x a week    Outpatient Medications Prior to Visit  Medication Sig Dispense Refill  . alendronate (FOSAMAX) 70 MG tablet TAKE 1 TABLET BY MOUTH EVERY 7 DAYS. TAKE WITH A FULL GLASS OF WATER ON AN EMPTY STOMACH 4 tablet 5  . aspirin 81 MG tablet Take 81 mg by mouth daily.      Marland Kitchen  Calcium Carbonate-Vit D-Min (CALCIUM 1200 PO) Take 1,200 mg by mouth daily. 1 tab po qd    . Fish Oil OIL 2 tabs po qd     . Multiple Vitamin (MULTIVITAMIN) capsule Take 1 capsule by mouth daily.      . ramipril (ALTACE) 10 MG capsule Take 1 capsule (10 mg total) by mouth daily. 90 capsule 2  . rosuvastatin (CRESTOR) 20 MG tablet TAKE 1 TABLET BY MOUTH EVERY DAY 90 tablet 0  . rosuvastatin (CRESTOR) 20 MG tablet Take 1 tablet (20 mg total) by mouth daily. 90 tablet 1   No facility-administered medications prior to visit.     No Known Allergies  Review of Systems  Constitutional: Negative for fever and malaise/fatigue.  HENT: Negative for congestion.   Eyes: Negative for blurred vision.  Respiratory: Negative for cough and shortness of breath.   Cardiovascular: Negative for chest pain, palpitations and leg swelling.  Gastrointestinal: Negative for vomiting.  Genitourinary: Negative for dysuria and urgency.  Musculoskeletal: Negative for  back pain.  Skin: Negative for rash.  Neurological: Negative for loss of consciousness and headaches.       Objective:    Physical Exam  Constitutional: She is oriented to person, place, and time. She appears well-developed and well-nourished.  HENT:  Head: Normocephalic and atraumatic.  Eyes: Conjunctivae and EOM are normal.  Neck: Normal range of motion. Neck supple. No JVD present. Carotid bruit is not present. No thyromegaly present.  Cardiovascular: Normal rate, regular rhythm and normal heart sounds.   No murmur heard. Pulmonary/Chest: Effort normal and breath sounds normal. No respiratory distress. She has no wheezes. She has no rales. She exhibits no tenderness.  Genitourinary: There is bleeding in the vagina. No erythema or tenderness in the vagina. No foreign body in the vagina. No signs of injury around the vagina. No vaginal discharge found.  Musculoskeletal: She exhibits no edema.  Neurological: She is alert and oriented to person, place, and time.  Skin: Rash noted. Rash is macular.  Psychiatric: She has a normal mood and affect.  Nursing note and vitals reviewed.   BP 133/75 (BP Location: Left Arm, Cuff Size: Large)   Pulse (!) 110   Temp 98.6 F (37 C) (Oral)   Resp 16   Ht 5\' 5"  (1.651 m)   Wt 189 lb 6.4 oz (85.9 kg)   SpO2 98%   BMI 31.52 kg/m  Wt Readings from Last 3 Encounters:  09/24/16 189 lb 6.4 oz (85.9 kg)  04/13/16 204 lb 3.2 oz (92.6 kg)  01/21/16 207 lb (93.9 kg)   BP Readings from Last 3 Encounters:  09/24/16 133/75  04/13/16 118/82  01/21/16 127/64     Immunization History  Administered Date(s) Administered  . Influenza, High Dose Seasonal PF 02/25/2015, 04/13/2016  . Influenza,inj,Quad PF,36+ Mos 06/05/2014  . Pneumococcal Conjugate-13 02/25/2015  . Pneumococcal Polysaccharide-23 01/03/2014  . Tdap 10/14/2010  . Zoster 10/14/2010    Health Maintenance  Topic Date Due  . INFLUENZA VACCINE  12/30/2016  . Fecal DNA (Cologuard)   06/27/2017  . MAMMOGRAM  10/28/2017  . TETANUS/TDAP  10/13/2020  . DEXA SCAN  Completed  . Hepatitis C Screening  Completed  . PNA vac Low Risk Adult  Completed    Lab Results  Component Value Date   WBC 6.7 10/14/2015   HGB 13.6 10/14/2015   HCT 40.4 10/14/2015   PLT 259.0 10/14/2015   GLUCOSE 89 04/13/2016   CHOL 191 04/13/2016   TRIG  103.0 04/13/2016   HDL 81.30 04/13/2016   LDLDIRECT 123.7 05/30/2013   LDLCALC 89 04/13/2016   ALT 22 04/13/2016   AST 26 04/13/2016   NA 138 04/13/2016   K 4.7 04/13/2016   CL 99 04/13/2016   CREATININE 0.69 04/13/2016   BUN 24 (H) 04/13/2016   CO2 28 04/13/2016   TSH 1.62 10/14/2015    Lab Results  Component Value Date   TSH 1.62 10/14/2015   Lab Results  Component Value Date   WBC 6.7 10/14/2015   HGB 13.6 10/14/2015   HCT 40.4 10/14/2015   MCV 90.3 10/14/2015   PLT 259.0 10/14/2015   Lab Results  Component Value Date   NA 138 04/13/2016   K 4.7 04/13/2016   CO2 28 04/13/2016   GLUCOSE 89 04/13/2016   BUN 24 (H) 04/13/2016   CREATININE 0.69 04/13/2016   BILITOT 0.7 04/13/2016   ALKPHOS 49 04/13/2016   AST 26 04/13/2016   ALT 22 04/13/2016   PROT 8.0 04/13/2016   ALBUMIN 4.8 04/13/2016   CALCIUM 10.2 04/13/2016   GFR 89.63 04/13/2016   Lab Results  Component Value Date   CHOL 191 04/13/2016   Lab Results  Component Value Date   HDL 81.30 04/13/2016   Lab Results  Component Value Date   LDLCALC 89 04/13/2016   Lab Results  Component Value Date   TRIG 103.0 04/13/2016   Lab Results  Component Value Date   CHOLHDL 2 04/13/2016   No results found for: HGBA1C       Assessment & Plan:   Problem List Items Addressed This Visit    None    Visit Diagnoses    Dysuria    -  Primary   Relevant Orders   POCT Urinalysis Dipstick (Automated) (Completed)   Tinea corporis       Relevant Medications   naftifine (NAFTIN) 1 % cream   Post-menopausal bleeding       Relevant Orders   Ambulatory referral to  Gynecology      I am having Ms. Rogerson start on naftifine. I am also having her maintain her aspirin, Calcium Carbonate-Vit D-Min (CALCIUM 1200 PO), Fish Oil, multivitamin, alendronate, ramipril, and rosuvastatin.  Meds ordered this encounter  Medications  . naftifine (NAFTIN) 1 % cream    Sig: Apply topically daily.    Dispense:  30 g    Refill:  0    CMA served as scribe during this visit. History, Physical and Plan performed by medical provider. Documentation and orders reviewed and attested to.  Ann Held, DO

## 2016-09-24 NOTE — Telephone Encounter (Signed)
We can see her and check urine and rash while she is here

## 2016-10-12 ENCOUNTER — Other Ambulatory Visit: Payer: Self-pay | Admitting: Family Medicine

## 2016-10-12 DIAGNOSIS — Z1231 Encounter for screening mammogram for malignant neoplasm of breast: Secondary | ICD-10-CM

## 2016-10-13 ENCOUNTER — Other Ambulatory Visit (INDEPENDENT_AMBULATORY_CARE_PROVIDER_SITE_OTHER): Payer: Medicare Other

## 2016-10-13 DIAGNOSIS — I10 Essential (primary) hypertension: Secondary | ICD-10-CM

## 2016-10-13 DIAGNOSIS — E785 Hyperlipidemia, unspecified: Secondary | ICD-10-CM

## 2016-10-13 LAB — COMPREHENSIVE METABOLIC PANEL
ALBUMIN: 4.3 g/dL (ref 3.5–5.2)
ALT: 17 U/L (ref 0–35)
AST: 18 U/L (ref 0–37)
Alkaline Phosphatase: 35 U/L — ABNORMAL LOW (ref 39–117)
BUN: 15 mg/dL (ref 6–23)
CALCIUM: 9.6 mg/dL (ref 8.4–10.5)
CHLORIDE: 105 meq/L (ref 96–112)
CO2: 32 mEq/L (ref 19–32)
CREATININE: 0.67 mg/dL (ref 0.40–1.20)
GFR: 92.59 mL/min (ref >60.00)
Glucose, Bld: 104 mg/dL — ABNORMAL HIGH (ref 70–99)
POTASSIUM: 4.1 meq/L (ref 3.5–5.1)
Sodium: 140 mEq/L (ref 135–145)
TOTAL PROTEIN: 7 g/dL (ref 6.0–8.3)
Total Bilirubin: 0.4 mg/dL (ref 0.2–1.2)

## 2016-10-13 LAB — LIPID PANEL
CHOLESTEROL: 166 mg/dL (ref 0–200)
HDL: 68.5 mg/dL (ref >39.00)
LDL CALC: 79 mg/dL (ref 0–99)
NonHDL: 97.08
TRIGLYCERIDES: 88 mg/dL (ref 0.0–149.0)
Total CHOL/HDL Ratio: 2
VLDL: 17.6 mg/dL (ref 0.0–40.0)

## 2016-10-28 ENCOUNTER — Encounter: Payer: Self-pay | Admitting: Obstetrics & Gynecology

## 2016-10-28 ENCOUNTER — Ambulatory Visit (INDEPENDENT_AMBULATORY_CARE_PROVIDER_SITE_OTHER): Payer: Medicare Other | Admitting: Obstetrics & Gynecology

## 2016-10-28 VITALS — BP 134/47 | HR 64 | Ht 65.0 in | Wt 194.0 lb

## 2016-10-28 DIAGNOSIS — N950 Postmenopausal bleeding: Secondary | ICD-10-CM

## 2016-10-28 DIAGNOSIS — D261 Other benign neoplasm of corpus uteri: Secondary | ICD-10-CM | POA: Diagnosis not present

## 2016-10-28 NOTE — Patient Instructions (Signed)

## 2016-10-28 NOTE — Progress Notes (Signed)
History:  71 y.o. G2P2002 here today for Menopause age 82. Pt reports bleeding 1 month prev. She had spotting after that time as well. Pt denies bleeding since the first of this month.  Pt was concerned because she has a FH of cancer.  Mother died of ov cancer at age 65 year. Younger sister 2002 from brain hemangioma and lung ca.She also had cryotherapy of her cervix. Her daughter has had vaginal polyps.    Pt reports weight loss but, she was trying ot lose weight. Now she has gained it all back. Pt denies sweats hat are new.   Pt had normal PAP 06/05/2014 neg with neg hrHPV  The following portions of the patient's history were reviewed and updated as appropriate: allergies, current medications, past family history, past medical history, past social history, past surgical history and problem list.  Review of Systems:  Pertinent items are noted in HPI.   Objective:  Physical Exam Blood pressure (!) 134/47, pulse 64, height 5\' 5"  (1.651 m), weight 194 lb (88 kg).  CONSTITUTIONAL: Well-developed, well-nourished female in no acute distress.  HENT:  Normocephalic, atraumatic EYES: Conjunctivae and EOM are normal. No scleral icterus.  NECK: Normal range of motion SKIN: Skin is warm and dry. No rash noted. Not diaphoretic.No pallor. [place]: Alert and oriented to person, place, and time. Normal coordination.   The indications for endometrial biopsy were reviewed.   Risks of the biopsy including cramping, bleeding, infection, uterine perforation, inadequate specimen and need for additional procedures  were discussed. The patient states she understands and agrees to undergo procedure today. Consent was signed. Time out was performed. Urine HCG was negative. A sterile speculum was placed in the patient's vagina and the cervix was prepped with Betadine. A single-toothed tenaculum was placed on the anterior lip of the cervix to stabilize it. The 3 mm pipelle was introduced into the endometrial cavity  without difficulty to a depth of 7cm, and a moderate amount of tissue was obtained and sent to pathology. The instruments were removed from the patient's vagina. Minimal bleeding from the cervix was noted. The patient tolerated the procedure well. Routine post-procedure instructions were given to the patient. The patient will follow up to review the results and for further management.      Assessment & Plan:  PMPB s/p endometrial biopsy Need pelvic US  F/u in 2 week for results F/u surg path  Tina [name] L. Harraway-Smith, M.D., Cherlynn June

## 2016-10-30 ENCOUNTER — Ambulatory Visit
Admission: RE | Admit: 2016-10-30 | Discharge: 2016-10-30 | Disposition: A | Discharge status: Home / Self Care | Payer: Medicare Other | Source: Ambulatory Visit | Attending: Family Medicine | Admitting: Family Medicine

## 2016-10-30 DIAGNOSIS — Z1231 Encounter for screening mammogram for malignant neoplasm of breast: Secondary | ICD-10-CM

## 2016-11-03 ENCOUNTER — Ambulatory Visit (HOSPITAL_BASED_OUTPATIENT_CLINIC_OR_DEPARTMENT_OTHER)
Admission: RE | Admit: 2016-11-03 | Discharge: 2016-11-03 | Disposition: A | Discharge status: Home / Self Care | Payer: Medicare Other | Source: Ambulatory Visit | Attending: Obstetrics & Gynecology | Admitting: Obstetrics & Gynecology

## 2016-11-03 DIAGNOSIS — N950 Postmenopausal bleeding: Secondary | ICD-10-CM

## 2016-11-12 ENCOUNTER — Telehealth: Payer: Self-pay | Admitting: Obstetrics & Gynecology

## 2016-11-12 NOTE — Telephone Encounter (Signed)
TC to pt to review results of [place] and endo bx. She has had no further sx. She will f/u in 3 months if she has sx and in 1 yer if no sx.   All of her questions were answered.  clh-S

## 2016-11-13 ENCOUNTER — Ambulatory Visit: Payer: Medicare Other | Admitting: Obstetrics & Gynecology

## 2016-11-16 ENCOUNTER — Other Ambulatory Visit: Payer: Self-pay | Admitting: Family Medicine

## 2016-12-30 ENCOUNTER — Other Ambulatory Visit: Payer: Self-pay | Admitting: Family Medicine

## 2017-01-11 ENCOUNTER — Other Ambulatory Visit: Payer: Self-pay | Admitting: Family Medicine

## 2017-02-15 ENCOUNTER — Other Ambulatory Visit: Payer: Self-pay | Admitting: Family Medicine

## 2017-02-19 NOTE — Progress Notes (Signed)
Subjective:   Tina [name] is a 71 y.o. female who presents for Medicare Annual (Subsequent) preventive examination.  Review of Systems:  No ROS.  Medicare Wellness Visit. Additional risk factors are reflected in the social history. Cardiac Risk Factors include: advanced age (>34men, >30 women);dyslipidemia;hypertension Sleep patterns: very restless sleep per pt. Pt declines taking any meds to help her sleep. Home Safety/Smoke Alarms: Feels safe in home. Smoke alarms in place.  Living environment; residence and Firearm Safety: Lives alone in 1 story home. Seat Belt Safety/Bike Helmet: Wears seat belt.   Female:   Pap- last 06/05/14. normal      Mammo- last 11/02/16: BI-RADS CATEGORY  1: Negative.      Dexa scan- last 10/29/15  osteopenia       CCS-  last 06/20/14: normal -cologuard Objective:     Vitals: BP 122/64 (BP Location: Left Arm, Patient Position: Sitting, Cuff Size: Normal)   Pulse 69   Ht 5\' 5"  (1.651 m)   Wt 168 lb 12.8 oz (76.6 kg)   SpO2 98%   BMI 28.09 kg/m   Body mass index is 28.09 kg/m.   Tobacco History  Smoking Status  . Former Smoker  . Packs/day: 1.00  . Years: 30.00  . Quit date: 12/13/2002  Smokeless Tobacco  . Former Systems developer  . Quit date: 06/01/2002     Counseling given: Not Answered   Past Medical History:  Diagnosis Date  . Fatty liver   . Goiter   . HTN (hypertension)   . Hyperlipidemia   . Myocardial infarct ([place]) hx of 2004  . Wrist fracture    Past Surgical History:  Procedure Laterality Date  . CARDIAC CATHETERIZATION  2004   stent  . sleep study  2012   Family History  Problem Relation Age of Onset  . Hypertension Mother   . Heart disease Mother 80       MI--- died from #3 at 40  . Ovarian cancer Mother   . Cancer Mother   . Hypertension Father   . Cancer Father 27       ovarian  . Heart disease Father   . Hypertension Sister   . Cancer Sister 80       brain tumor  . Coronary artery disease Unknown   . Lung cancer  Unknown   . Ovarian cancer Unknown   . Diabetes Maternal Grandmother   . Hypertension Brother   . Arthritis Brother    History  Sexual Activity  . Sexual activity: Not Currently  . Partners: Male    Outpatient Encounter Prescriptions as of 02/23/2017  Medication Sig  . alendronate (FOSAMAX) 70 MG tablet TAKE 1 TABLET BY MOUTH EVERY 7 DAYS. TAKE WITH A FULL GLASS OF WATER ON AN EMPTY STOMACH  . aspirin 81 MG tablet Take 81 mg by mouth daily.    . Calcium Carbonate-Vit D-Min (CALCIUM 1200 PO) Take 1,200 mg by mouth daily. 1 tab po qd  . Fish Oil OIL 2 tabs po qd   . Multiple Vitamin (MULTIVITAMIN) capsule Take 1 capsule by mouth daily.    . naftifine (NAFTIN) 1 % cream Apply topically daily.  . ramipril (ALTACE) 10 MG capsule Take 1 capsule (10 mg total) by mouth daily.  . rosuvastatin (CRESTOR) 20 MG tablet TAKE 1 TABLET BY MOUTH EVERY DAY  . [DISCONTINUED] alendronate (FOSAMAX) 70 MG tablet TAKE 1 TABLET BY MOUTH EVERY 7 DAYS WITH A FULL GLASS OF WATER AND ON AN EMPTY  STOMACH  . [DISCONTINUED] ramipril (ALTACE) 10 MG capsule TAKE 1 CAPSULE BY MOUTH DAILY   No facility-administered encounter medications on file as of 02/23/2017.     Activities of Daily Living In your present state of health, do you have any difficulty performing the following activities: 02/23/2017  Hearing? N  Vision? N  Comment wearing glasses. Dr.Digby yearly. Due in Feb per pt  Difficulty concentrating or making decisions? N  Walking or climbing stairs? N  Dressing or bathing? N  Doing errands, shopping? N  Preparing Food and eating ? N  Using the Toilet? N  In the past six months, have you accidently leaked urine? Y  Comment wears pad  Do you have problems with loss of bowel control? N  Managing your Medications? N  Managing your Finances? N  Housekeeping or managing your Housekeeping? N  Some recent data might be hidden    Patient Care Team: Carollee Herter, Alferd Apa, DO as PCP - [place],  DDS as Consulting Physician (Dentistry) Shawnie Dapper, DO as Consulting Physician (Optometry) Fay Records, MD as Consulting Physician (Cardiology)    Assessment:    Physical assessment deferred to PCP.  Exercise Activities and Dietary recommendations Current Exercise Habits: Home exercise routine, Type of exercise: treadmill, Time (Minutes): 30, Frequency (Times/Week): 4, Weekly Exercise (Minutes/Week): 120, Intensity: Mild, Exercise limited by: None identified   Diet (meal preparation, eat out, water intake, caffeinated beverages, dairy products, fruits and vegetables): in general, a "healthy" diet  , well balanced    Goals      Patient Stated   . continue healthy diet (pt-stated)      Other   . Continue healthy diet      Fall Risk Fall Risk  02/23/2017 10/11/2015 06/05/2014 06/05/2014 05/29/2013  Falls in the past year? No Yes No No Yes  Number falls in past yr: - 1 - - 1  Injury with Fall? - Yes - - No  Risk for fall due to : - - - - Other (Comment)  Risk for fall due to: Comment - - - - Fell over cement while in [place] 2/9 Scores 02/23/2017 04/13/2016 10/11/2015 06/05/2014  PHQ - 2 Score 0 0 0 0     Cognitive Function Ad8 score reviewed for issues:  Issues making decisions:no  Less interest in hobbies / activities:no  Repeats questions, stories (family complaining):no  Trouble using ordinary gadgets (microwave, computer, phone):no  Forgets the month or year: no  Mismanaging finances: no  Remembering appts:no  Daily problems with thinking and/or memory:no Ad8 score is=0        Immunization History  Administered Date(s) Administered  . Influenza, High Dose Seasonal PF 02/25/2015, 04/13/2016  . Influenza,inj,Quad PF,6+ Mos 06/05/2014  . Pneumococcal Conjugate-13 02/25/2015  . Pneumococcal Polysaccharide-23 01/03/2014  . Tdap 10/14/2010  . Zoster 10/14/2010   Screening Tests Health Maintenance  Topic Date Due  . INFLUENZA  VACCINE  12/30/2016  . Fecal DNA (Cologuard)  06/27/2017  . MAMMOGRAM  10/31/2018  . TETANUS/TDAP  10/13/2020  . DEXA SCAN  Completed  . Hepatitis C Screening  Completed  . PNA vac Low Risk Adult  Completed      Plan:   Follow up with PCP as directed 02/27/17  Continue to eat heart healthy diet (full of fruits, vegetables, whole grains, lean protein, water--limit salt, fat, and sugar intake) and increase physical activity as tolerated.  Continue doing brain stimulating activities (puzzles, reading,  adult coloring books, staying active) to keep memory sharp.   I have personally reviewed and noted the following in the patient's chart:   . Medical and social history . Use of alcohol, tobacco or illicit drugs  . Current medications and supplements . Functional ability and status . Nutritional status . Physical activity . Advanced directives . List of other physicians . Hospitalizations, surgeries, and ER visits in previous 12 months . Vitals . Screenings to include cognitive, depression, and falls . Referrals and appointments  In addition, I have reviewed and discussed with patient certain preventive protocols, quality metrics, and best practice recommendations. A written personalized care plan for preventive services as well as general preventive health recommendations were provided to patient.     Naaman Plummer [place], [place]  02/23/2017

## 2017-02-23 ENCOUNTER — Ambulatory Visit (INDEPENDENT_AMBULATORY_CARE_PROVIDER_SITE_OTHER): Payer: Medicare Other | Admitting: *Deleted

## 2017-02-23 ENCOUNTER — Encounter: Payer: Self-pay | Admitting: *Deleted

## 2017-02-23 VITALS — BP 122/64 | HR 69 | Ht 65.0 in | Wt 168.8 lb

## 2017-02-23 DIAGNOSIS — Z0000 Encounter for general adult medical examination without abnormal findings: Secondary | ICD-10-CM | POA: Diagnosis not present

## 2017-02-23 NOTE — Patient Instructions (Addendum)
Tina [name] , Thank you for taking time to come for your Medicare Wellness Visit. I appreciate your ongoing commitment to your health goals. Please review the following plan we discussed and let me know if I can assist you in the future.   These are the goals we discussed: Goals      Patient Stated   . continue healthy diet (pt-stated)      Other   . Continue healthy diet       This is a list of the screening recommended for you and due dates:  Health Maintenance  Topic Date Due  . Flu Shot  12/30/2016  . Cologuard (Stool DNA test)  06/27/2017  . Mammogram  10/31/2018  . Tetanus Vaccine  10/13/2020  . DEXA scan (bone density measurement)  Completed  .  Hepatitis C: One time screening is recommended by Center for Disease Control  (CDC) for  adults born from [birth year] through 1964.   Completed  . Pneumonia vaccines  Completed   Follow up with PCP as directed 02/27/17  Continue to eat heart healthy diet (full of fruits, vegetables, whole grains, lean protein, water--limit salt, fat, and sugar intake) and increase physical activity as tolerated.  Continue doing brain stimulating activities (puzzles, reading, adult coloring books, staying active) to keep memory sharp.    Health Maintenance for Postmenopausal Women Menopause is a normal process in which your reproductive ability comes to an end. This process happens gradually over a span of months to years, usually between the ages of 4 and 45. Menopause is complete when you have missed 12 consecutive menstrual periods. It is important to talk with your health care provider about some of the most common conditions that affect postmenopausal women, such as heart disease, cancer, and bone loss (osteoporosis). Adopting a healthy lifestyle and getting preventive care can help to promote your health and wellness. Those actions can also lower your chances of developing some of these common conditions. What should I know about menopause? During  menopause, you may experience a number of symptoms, such as:  Moderate-to-severe hot flashes.  Night sweats.  Decrease in sex drive.  Mood swings.  Headaches.  Tiredness.  Irritability.  Memory problems.  Insomnia.  Choosing to treat or not to treat menopausal changes is an individual decision that you make with your health care provider. What should I know about hormone replacement therapy and supplements? Hormone therapy products are effective for treating symptoms that are associated with menopause, such as hot flashes and night sweats. Hormone replacement carries certain risks, especially as you become older. If you are thinking about using estrogen or estrogen with progestin treatments, discuss the benefits and risks with your health care provider. What should I know about heart disease and stroke? Heart disease, heart attack, and stroke become more likely as you age. This may be due, in part, to the hormonal changes that your body experiences during menopause. These can affect how your body processes dietary fats, triglycerides, and cholesterol. Heart attack and stroke are both medical emergencies. There are many things that you can do to help prevent heart disease and stroke:  Have your blood pressure checked at least every 1-2 years. High blood pressure causes heart disease and increases the risk of stroke.  If you are 35-44 years old, ask your health care provider if you should take aspirin to prevent a heart attack or a stroke.  Do not use any tobacco products, including cigarettes, chewing tobacco, or electronic cigarettes. If  you need help quitting, ask your health care provider.  It is important to eat a healthy diet and maintain a healthy weight. ? Be sure to include plenty of vegetables, fruits, low-fat dairy products, and lean protein. ? Avoid eating foods that are high in solid fats, added sugars, or salt (sodium).  Get regular exercise. This is one of the most  important things that you can do for your health. ? Try to exercise for at least 150 minutes each week. The type of exercise that you do should increase your heart rate and make you sweat. This is known as moderate-intensity exercise. ? Try to do strengthening exercises at least twice each week. Do these in addition to the moderate-intensity exercise.  Know your numbers.Ask your health care provider to check your cholesterol and your blood glucose. Continue to have your blood tested as directed by your health care provider.  What should I know about cancer screening? There are several types of cancer. Take the following steps to reduce your risk and to catch any cancer development as early as possible. Breast Cancer  Practice breast self-awareness. ? This means understanding how your breasts normally appear and feel. ? It also means doing regular breast self-exams. Let your health care provider know about any changes, no matter how small.  If you are 49 or older, have a clinician do a breast exam (clinical breast exam or CBE) every year. Depending on your age, family history, and medical history, it may be recommended that you also have a yearly breast X-ray (mammogram).  If you have a family history of breast cancer, talk with your health care provider about genetic screening.  If you are at high risk for breast cancer, talk with your health care provider about having an MRI and a mammogram every year.  Breast cancer (BRCA) gene test is recommended for women who have family members with BRCA-related cancers. Results of the assessment will determine the need for genetic counseling and BRCA1 and for BRCA2 testing. BRCA-related cancers include these types: ? Breast. This occurs in males or females. ? Ovarian. ? Tubal. This may also be called fallopian tube cancer. ? Cancer of the abdominal or pelvic lining (peritoneal cancer). ? Prostate. ? Pancreatic.  Cervical, Uterine, and Ovarian  Cancer Your health care provider may recommend that you be screened regularly for cancer of the pelvic organs. These include your ovaries, uterus, and vagina. This screening involves a pelvic exam, which includes checking for microscopic changes to the surface of your cervix (Pap test).  For women ages 21-65, health care providers may recommend a pelvic exam and a Pap test every three years. For women ages 64-65, they may recommend the Pap test and pelvic exam, combined with testing for human papilloma virus (HPV), every five years. Some types of HPV increase your risk of cervical cancer. Testing for HPV may also be done on women of any age who have unclear Pap test results.  Other health care providers may not recommend any screening for nonpregnant women who are considered low risk for pelvic cancer and have no symptoms. Ask your health care provider if a screening pelvic exam is right for you.  If you have had past treatment for cervical cancer or a condition that could lead to cancer, you need Pap tests and screening for cancer for at least 20 years after your treatment. If Pap tests have been discontinued for you, your risk factors (such as having a new sexual partner) need to  be reassessed to determine if you should start having screenings again. Some women have medical problems that increase the chance of getting cervical cancer. In these cases, your health care provider may recommend that you have screening and Pap tests more often.  If you have a family history of uterine cancer or ovarian cancer, talk with your health care provider about genetic screening.  If you have vaginal bleeding after reaching menopause, tell your health care provider.  There are currently no reliable tests available to screen for ovarian cancer.  Lung Cancer Lung cancer screening is recommended for adults 62-20 years old who are at high risk for lung cancer because of a history of smoking. A yearly low-dose CT scan  of the lungs is recommended if you:  Currently smoke.  Have a history of at least 30 pack-years of smoking and you currently smoke or have quit within the past 15 years. A pack-year is smoking an average of one pack of cigarettes per day for one year.  Yearly screening should:  Continue until it has been 15 years since you quit.  Stop if you develop a health problem that would prevent you from having lung cancer treatment.  Colorectal Cancer  This type of cancer can be detected and can often be prevented.  Routine colorectal cancer screening usually begins at age 35 and continues through age 20.  If you have risk factors for colon cancer, your health care provider may recommend that you be screened at an earlier age.  If you have a family history of colorectal cancer, talk with your health care provider about genetic screening.  Your health care provider may also recommend using home test kits to check for hidden blood in your stool.  A small camera at the end of a tube can be used to examine your colon directly (sigmoidoscopy or colonoscopy). This is done to check for the earliest forms of colorectal cancer.  Direct examination of the colon should be repeated every 5-10 years until age 1. However, if early forms of precancerous polyps or small growths are found or if you have a family history or genetic risk for colorectal cancer, you may need to be screened more often.  Skin Cancer  Check your skin from head to toe regularly.  Monitor any moles. Be sure to tell your health care provider: ? About any new moles or changes in moles, especially if there is a change in a mole's shape or color. ? If you have a mole that is larger than the size of a pencil eraser.  If any of your family members has a history of skin cancer, especially at a young age, talk with your health care provider about genetic screening.  Always use sunscreen. Apply sunscreen liberally and repeatedly  throughout the day.  Whenever you are outside, protect yourself by wearing long sleeves, pants, a wide-brimmed hat, and sunglasses.  What should I know about osteoporosis? Osteoporosis is a condition in which bone destruction happens more quickly than new bone creation. After menopause, you may be at an increased risk for osteoporosis. To help prevent osteoporosis or the bone fractures that can happen because of osteoporosis, the following is recommended:  If you are 9-40 years old, get at least 1,000 mg of calcium and at least 600 mg of vitamin D per day.  If you are older than age 39 but younger than age 6, get at least 1,200 mg of calcium and at least 600 mg of vitamin D  per day.  If you are older than age 42, get at least 1,200 mg of calcium and at least 800 mg of vitamin D per day.  Smoking and excessive alcohol intake increase the risk of osteoporosis. Eat foods that are rich in calcium and vitamin D, and do weight-bearing exercises several times each week as directed by your health care provider. What should I know about how menopause affects my mental health? Depression may occur at any age, but it is more common as you become older. Common symptoms of depression include:  Low or sad mood.  Changes in sleep patterns.  Changes in appetite or eating patterns.  Feeling an overall lack of motivation or enjoyment of activities that you previously enjoyed.  Frequent crying spells.  Talk with your health care provider if you think that you are experiencing depression. What should I know about immunizations? It is important that you get and maintain your immunizations. These include:  Tetanus, diphtheria, and pertussis (Tdap) booster vaccine.  Influenza every year before the flu season begins.  Pneumonia vaccine.  Shingles vaccine.  Your health care provider may also recommend other immunizations. This information is not intended to replace advice given to you by your health  care provider. Make sure you discuss any questions you have with your health care provider. Document Released: 07/10/2005 Document Revised: 12/06/2015 Document Reviewed: 02/19/2015 Elsevier Interactive Patient Education  2018 [employer].

## 2017-02-24 ENCOUNTER — Encounter: Payer: Self-pay | Admitting: Family Medicine

## 2017-02-24 ENCOUNTER — Ambulatory Visit (INDEPENDENT_AMBULATORY_CARE_PROVIDER_SITE_OTHER): Payer: Medicare Other | Admitting: Family Medicine

## 2017-02-24 VITALS — BP 115/63 | HR 70 | Temp 98.0°F | Ht 65.0 in | Wt 169.0 lb

## 2017-02-24 DIAGNOSIS — R42 Dizziness and giddiness: Secondary | ICD-10-CM

## 2017-02-24 DIAGNOSIS — Z23 Encounter for immunization: Secondary | ICD-10-CM | POA: Diagnosis not present

## 2017-02-24 DIAGNOSIS — I10 Essential (primary) hypertension: Secondary | ICD-10-CM

## 2017-02-24 DIAGNOSIS — E785 Hyperlipidemia, unspecified: Secondary | ICD-10-CM | POA: Diagnosis not present

## 2017-02-24 DIAGNOSIS — R5383 Other fatigue: Secondary | ICD-10-CM

## 2017-02-24 DIAGNOSIS — R001 Bradycardia, unspecified: Secondary | ICD-10-CM

## 2017-02-24 LAB — COMPREHENSIVE METABOLIC PANEL
ALT: 16 U/L (ref 0–35)
AST: 20 U/L (ref 0–37)
Albumin: 4.7 g/dL (ref 3.5–5.2)
Alkaline Phosphatase: 41 U/L (ref 39–117)
BUN: 13 mg/dL (ref 6–23)
CALCIUM: 10.3 mg/dL (ref 8.4–10.5)
CHLORIDE: 98 meq/L (ref 96–112)
CO2: 29 meq/L (ref 19–32)
Creatinine, Ser: 0.66 mg/dL (ref 0.40–1.20)
GFR: 94.11 mL/min (ref >60.00)
Glucose, Bld: 92 mg/dL (ref 70–99)
POTASSIUM: 4.6 meq/L (ref 3.5–5.1)
Sodium: 136 mEq/L (ref 135–145)
Total Bilirubin: 0.6 mg/dL (ref 0.2–1.2)
Total Protein: 7.6 g/dL (ref 6.0–8.3)

## 2017-02-24 LAB — POC URINALSYSI DIPSTICK (AUTOMATED)
BILIRUBIN UA: NEGATIVE
Blood, UA: NEGATIVE
Glucose, UA: NEGATIVE
LEUKOCYTES UA: NEGATIVE
NITRITE UA: NEGATIVE
PH UA: 6 (ref 5.0–8.0)
PROTEIN UA: NEGATIVE
Spec Grav, UA: 1.02 (ref 1.010–1.025)
Urobilinogen, UA: 0.2 E.U./dL

## 2017-02-24 LAB — CBC WITH DIFFERENTIAL/PLATELET
BASOS PCT: 0.5 % (ref 0.0–3.0)
Basophils Absolute: 0 10*3/uL (ref 0.0–0.1)
EOS ABS: 0 10*3/uL (ref 0.0–0.7)
EOS PCT: 0.6 % (ref 0.0–5.0)
HEMATOCRIT: 42.8 % (ref 36.0–46.0)
Hemoglobin: 14.4 g/dL (ref 12.0–15.0)
LYMPHS PCT: 26.8 % (ref 12.0–46.0)
Lymphs Abs: 2.2 10*3/uL (ref 0.7–4.0)
MCHC: 33.7 g/dL (ref 30.0–36.0)
MCV: 92.8 fl (ref 78.0–100.0)
Monocytes Absolute: 0.5 10*3/uL (ref 0.1–1.0)
Monocytes Relative: 5.9 % (ref 3.0–12.0)
NEUTROS ABS: 5.4 10*3/uL (ref 1.4–7.7)
Neutrophils Relative %: 66.2 % (ref 43.0–77.0)
PLATELETS: 254 10*3/uL (ref 150.0–400.0)
RBC: 4.61 Mil/uL (ref 3.87–5.11)
RDW: 13.7 % (ref 11.5–15.5)
WBC: 8.1 10*3/uL (ref 4.0–10.5)

## 2017-02-24 LAB — LIPID PANEL
Cholesterol: 148 mg/dL (ref 0–200)
HDL: 61.5 mg/dL (ref >39.00)
LDL Cholesterol: 73 mg/dL (ref 0–99)
NONHDL: 86.73
TRIGLYCERIDES: 67 mg/dL (ref 0.0–149.0)
Total CHOL/HDL Ratio: 2
VLDL: 13.4 mg/dL (ref 0.0–40.0)

## 2017-02-24 LAB — VITAMIN B12: Vitamin B-12: 605 pg/mL (ref 211–911)

## 2017-02-24 LAB — TSH: TSH: 1.07 u[IU]/mL (ref 0.35–4.50)

## 2017-02-24 MED ORDER — RAMIPRIL 5 MG PO CAPS: 5.0000 mg | ORAL_CAPSULE | Freq: Every day | ORAL | 3 refills | Status: DC

## 2017-02-24 NOTE — Patient Instructions (Signed)
Dizziness Dizziness is a common problem. It is a feeling of unsteadiness or light-headedness. You may feel like you are about to faint. Dizziness can lead to injury if you stumble or fall. Anyone can become dizzy, but dizziness is more common in older adults. This condition can be caused by a number of things, including medicines, dehydration, or illness. Follow these instructions at home: Taking these steps may help with your condition: Eating and drinking   Drink enough fluid to keep your urine clear or pale yellow. This helps to keep you from becoming dehydrated. Try to drink more clear fluids, such as water.  Do not drink alcohol.  Limit your caffeine intake if directed by your health care provider.  Limit your salt intake if directed by your health care provider. Activity   Avoid making quick movements.  Rise slowly from chairs and steady yourself until you feel okay.  In the morning, first sit up on the side of the bed. When you feel okay, stand slowly while you hold onto something until you know that your balance is fine.  Move your legs often if you need to stand in one place for a long time. Tighten and relax your muscles in your legs while you are standing.  Do not drive or operate heavy machinery if you feel dizzy.  Avoid bending down if you feel dizzy. Place items in your home so that they are easy for you to reach without leaning over. Lifestyle   Do not use any tobacco products, including cigarettes, chewing tobacco, or electronic cigarettes. If you need help quitting, ask your health care provider.  Try to reduce your stress level, such as with yoga or meditation. Talk with your health care provider if you need help. General instructions   Watch your dizziness for any changes.  Take medicines only as directed by your health care provider. Talk with your health care provider if you think that your dizziness is caused by a medicine that you are taking.  Tell a friend  or a family member that you are feeling dizzy. If he or she notices any changes in your behavior, have this person call your health care provider.  Keep all follow-up visits as directed by your health care provider. This is important. Contact a health care provider if:  Your dizziness does not go away.  Your dizziness or light-headedness gets worse.  You feel nauseous.  You have reduced hearing.  You have new symptoms.  You are unsteady on your feet or you feel like the room is spinning. Get help right away if:  You vomit or have diarrhea and are unable to eat or drink anything.  You have problems talking, walking, swallowing, or using your arms, hands, or legs.  You feel generally weak.  You are not thinking clearly or you have trouble forming sentences. It may take a friend or family member to notice this.  You have chest pain, abdominal pain, shortness of breath, or sweating.  Your vision changes.  You notice any bleeding.  You have a headache.  You have neck pain or a stiff neck.  You have a fever. This information is not intended to replace advice given to you by your health care provider. Make sure you discuss any questions you have with your health care provider. Document Released: 11/11/2000 Document Revised: 10/24/2015 Document Reviewed: 05/14/2014 Elsevier Interactive Patient Education  2017 [employer]  

## 2017-02-24 NOTE — Addendum Note (Signed)
Addended by: Harl Bowie on: 02/24/2017 05:33 PM   Modules accepted: Orders

## 2017-02-24 NOTE — Progress Notes (Signed)
Patient ID: Tina [name], female    DOB: 10-04-1945  Age: 71 y.o. MRN: [id number]    Subjective:  Subjective  HPI Tina [name] presents for light headedness that has been more often.  Bothers her a couple of times a week    + doe no cp, no palpitations but she is still able to get on the treadmill 30 min.  She denies any congestion   Pt did not take her med yesterday.  She has lost 40 lbs (she has been working hard at it).    Review of Systems  Genitourinary: Negative.   Musculoskeletal: Negative.   Skin: Negative.   Neurological: Positive for light-headedness. Negative for dizziness and weakness.  Psychiatric/Behavioral: Negative.     History Past Medical History:  Diagnosis Date  . Fatty liver   . Goiter   . HTN (hypertension)   . Hyperlipidemia   . Myocardial infarct ([place]) hx of 2004  . Wrist fracture     She has a past surgical history that includes Cardiac catheterization (2004) and sleep study (2012).   Her family history includes Arthritis in her brother; Cancer in her mother; Cancer (age of onset: 4) in her sister; Cancer (age of onset: 41) in her father; Coronary artery disease in her unknown relative; Diabetes in her maternal grandmother; Heart disease in her father; Heart disease (age of onset: 55) in her mother; Hypertension in her brother, father, mother, and sister; Lung cancer in her unknown relative; Ovarian cancer in her mother and unknown relative.She reports that she quit smoking about 14 years ago. She has a 30.00 pack-year smoking history. She quit smokeless tobacco use about 14 years ago. She reports that she does not drink alcohol or use drugs.  Current Outpatient Prescriptions on File Prior to Visit  Medication Sig Dispense Refill  . alendronate (FOSAMAX) 70 MG tablet TAKE 1 TABLET BY MOUTH EVERY 7 DAYS. TAKE WITH A FULL GLASS OF WATER ON AN EMPTY STOMACH 4 tablet 5  . aspirin 81 MG tablet Take 81 mg by mouth daily.      . Calcium Carbonate-Vit D-Min  (CALCIUM 1200 PO) Take 1,200 mg by mouth daily. 1 tab po qd    . Fish Oil OIL 2 tabs po qd     . Multiple Vitamin (MULTIVITAMIN) capsule Take 1 capsule by mouth daily.      . ramipril (ALTACE) 10 MG capsule Take 1 capsule (10 mg total) by mouth daily. 90 capsule 2  . rosuvastatin (CRESTOR) 20 MG tablet TAKE 1 TABLET BY MOUTH EVERY DAY 90 tablet 0   No current facility-administered medications on file prior to visit.      Objective:  Objective  Physical Exam  Constitutional: She is oriented to person, place, and time. She appears well-developed and well-nourished.  HENT:  Head: Normocephalic and atraumatic.  Eyes: Conjunctivae and EOM are normal.  Neck: Normal range of motion. Neck supple. No JVD present. Carotid bruit is not present. No thyromegaly present.  Cardiovascular: Normal rate, regular rhythm and normal heart sounds.   No murmur heard. Pulmonary/Chest: Effort normal and breath sounds normal. No respiratory distress. She has no wheezes. She has no rales. She exhibits no tenderness.  Musculoskeletal: She exhibits no edema.  Neurological: She is alert and oriented to person, place, and time.  Psychiatric: She has a normal mood and affect.  Nursing note and vitals reviewed.  BP 115/63   Pulse 70   Temp 98 F (36.7 C)   Ht  5\' 5"  (1.651 m)   Wt 169 lb (76.7 kg)   SpO2 98%   BMI 28.12 kg/m  Wt Readings from Last 3 Encounters:  02/24/17 169 lb (76.7 kg)  02/23/17 168 lb 12.8 oz (76.6 kg)  10/28/16 194 lb (88 kg)     Lab Results  Component Value Date   WBC 8.1 02/24/2017   HGB 14.4 02/24/2017   HCT 42.8 02/24/2017   PLT 254.0 02/24/2017   GLUCOSE 92 02/24/2017   CHOL 148 02/24/2017   TRIG 67.0 02/24/2017   HDL 61.50 02/24/2017   LDLDIRECT 123.7 05/30/2013   LDLCALC 73 02/24/2017   ALT 16 02/24/2017   AST 20 02/24/2017   NA 136 02/24/2017   K 4.6 02/24/2017   CL 98 02/24/2017   CREATININE 0.66 02/24/2017   BUN 13 02/24/2017   CO2 29 02/24/2017   TSH 1.07  02/24/2017    US Transvaginal Non-ob  Result Date: 11/03/2016 CLINICAL DATA:  Sporadic postmenopausal vaginal spotting for 1 month, endometrial biopsy 6 days ago, mother had ovarian cancer EXAM: TRANSABDOMINAL AND TRANSVAGINAL ULTRASOUND OF PELVIS TECHNIQUE: Both transabdominal and transvaginal ultrasound examinations of the pelvis were performed. Transabdominal technique was performed for global imaging of the pelvis including uterus, ovaries, adnexal regions, and pelvic cul-de-sac. It was necessary to proceed with endovaginal exam following the transabdominal exam to visualize the endometrium and ovaries. COMPARISON:  None FINDINGS: Uterus Measurements: 5.8 x 2.7 x 3.5 cm. Normal morphology without mass Endometrium Thickness: 4 mm thick.  Small amount of endometrial fluid Right ovary Not visualized on either transabdominal or endovaginal imaging question due to atrophy versus obscuration by bowel Left ovary Not visualized on either transabdominal or endovaginal imaging question due to atrophy versus obscuration by bowel. Other findings No abnormal free fluid. No adnexal masses. IMPRESSION: Small amount of nonspecific endometrial fluid. Otherwise negative exam. Electronically Signed   By: Lavonia Dana M.D.   On: 11/03/2016 14:15   US Pelvis Complete  Result Date: 11/03/2016 CLINICAL DATA:  Sporadic postmenopausal vaginal spotting for 1 month, endometrial biopsy 6 days ago, mother had ovarian cancer EXAM: TRANSABDOMINAL AND TRANSVAGINAL ULTRASOUND OF PELVIS TECHNIQUE: Both transabdominal and transvaginal ultrasound examinations of the pelvis were performed. Transabdominal technique was performed for global imaging of the pelvis including uterus, ovaries, adnexal regions, and pelvic cul-de-sac. It was necessary to proceed with endovaginal exam following the transabdominal exam to visualize the endometrium and ovaries. COMPARISON:  None FINDINGS: Uterus Measurements: 5.8 x 2.7 x 3.5 cm. Normal morphology without  mass Endometrium Thickness: 4 mm thick.  Small amount of endometrial fluid Right ovary Not visualized on either transabdominal or endovaginal imaging question due to atrophy versus obscuration by bowel Left ovary Not visualized on either transabdominal or endovaginal imaging question due to atrophy versus obscuration by bowel. Other findings No abnormal free fluid. No adnexal masses. IMPRESSION: Small amount of nonspecific endometrial fluid. Otherwise negative exam. Electronically Signed   By: Lavonia Dana M.D.   On: 11/03/2016 14:15    ekg-  Sinus brady  Assessment & Plan:  Plan  I have discontinued Tina [name]'s naftifine. I am also having her start on ramipril. Additionally, I am having her maintain her aspirin, Calcium Carbonate-Vit D-Min (CALCIUM 1200 PO), Fish Oil, multivitamin, alendronate, ramipril, and rosuvastatin.  Meds ordered this encounter  Medications  . ramipril (ALTACE) 5 MG capsule    Sig: Take 1 capsule (5 mg total) by mouth daily.    Dispense:  90 capsule    Refill:  3    Problem List Items Addressed This Visit      Unprioritized   Hyperlipidemia LDL goal <100   Relevant Medications   ramipril (ALTACE) 5 MG capsule   Other Relevant Orders   Lipid panel (Completed)   Essential hypertension    Running low Dec altace 5 mg daily Pt has lost 40 lbs  F/u 2-3 week to recheck bp      Relevant Medications   ramipril (ALTACE) 5 MG capsule   Sinus bradycardia    If con't will need f/u with DR Harrington Challenger Recheck 2-3 weeks       Relevant Medications   ramipril (ALTACE) 5 MG capsule    Other Visit Diagnoses    Light headedness    -  Primary   Relevant Orders   TSH (Completed)   Vitamin B12 (Completed)   CBC with Differential/Platelet (Completed)   Comprehensive metabolic panel (Completed)   POCT Urinalysis Dipstick (Automated)   EKG 12-Lead (Completed)   Fatigue, unspecified type       Relevant Orders   TSH (Completed)   Vitamin B12 (Completed)   CBC with  Differential/Platelet (Completed)   Comprehensive metabolic panel (Completed)   POCT Urinalysis Dipstick (Automated)   Need for influenza vaccination       Relevant Orders   Flu vaccine HIGH DOSE PF (Fluzone High dose) (Completed)      Follow-up: Return if symptoms worsen or fail to improve.  Ann Held, DO

## 2017-02-24 NOTE — Assessment & Plan Note (Signed)
Running low Dec altace 5 mg daily Pt has lost 40 lbs  F/u 2-3 week to recheck bp

## 2017-02-24 NOTE — Assessment & Plan Note (Signed)
If con't will need f/u with DR Harrington Challenger Recheck 2-3 weeks

## 2017-02-25 ENCOUNTER — Other Ambulatory Visit: Payer: Self-pay | Admitting: Family Medicine

## 2017-02-25 DIAGNOSIS — E785 Hyperlipidemia, unspecified: Secondary | ICD-10-CM

## 2017-03-08 ENCOUNTER — Ambulatory Visit: Payer: Medicare Other | Admitting: Family Medicine

## 2017-03-09 ENCOUNTER — Ambulatory Visit (INDEPENDENT_AMBULATORY_CARE_PROVIDER_SITE_OTHER): Payer: Medicare Other | Admitting: Family Medicine

## 2017-03-09 ENCOUNTER — Encounter: Payer: Self-pay | Admitting: Family Medicine

## 2017-03-09 VITALS — BP 145/71 | HR 56

## 2017-03-09 DIAGNOSIS — I10 Essential (primary) hypertension: Secondary | ICD-10-CM

## 2017-03-09 NOTE — Progress Notes (Signed)
Pre visit review using our clinic tool,if applicable. No additional management support is needed unless otherwise documented below in the visit note.  Patient in for BP check per order from Dr. Carollee Herter dated 02/24/17.  Patient states she continues to have lightheadedness periodically no other symptoms reported  BP = 145/71 P = 56     Per Dr.Lowne patient to return for OV in 3 months. If lightheadedness continues patient may be referred to Neurology.  Patient to try and schedule appointment on the same day she has labs.

## 2017-03-09 NOTE — Progress Notes (Signed)
Tina Julian R Lowne Chase, DO 

## 2017-03-11 ENCOUNTER — Ambulatory Visit: Payer: Medicare Other

## 2017-03-29 ENCOUNTER — Other Ambulatory Visit: Payer: Self-pay | Admitting: Family Medicine

## 2017-05-17 ENCOUNTER — Encounter: Payer: Self-pay | Admitting: Family Medicine

## 2017-05-17 ENCOUNTER — Ambulatory Visit: Payer: Medicare Other | Admitting: Family Medicine

## 2017-05-17 VITALS — BP 148/78 | HR 60 | Temp 97.4°F | Ht 65.0 in | Wt 172.0 lb

## 2017-05-17 DIAGNOSIS — I10 Essential (primary) hypertension: Secondary | ICD-10-CM

## 2017-05-17 DIAGNOSIS — E785 Hyperlipidemia, unspecified: Secondary | ICD-10-CM

## 2017-05-17 LAB — COMPREHENSIVE METABOLIC PANEL
ALBUMIN: 4.1 g/dL (ref 3.5–5.2)
ALK PHOS: 38 U/L — AB (ref 39–117)
ALT: 15 U/L (ref 0–35)
AST: 19 U/L (ref 0–37)
BILIRUBIN TOTAL: 0.6 mg/dL (ref 0.2–1.2)
BUN: 20 mg/dL (ref 6–23)
CALCIUM: 9.4 mg/dL (ref 8.4–10.5)
CHLORIDE: 103 meq/L (ref 96–112)
CO2: 31 mEq/L (ref 19–32)
CREATININE: 0.68 mg/dL (ref 0.40–1.20)
GFR: 90.86 mL/min (ref >60.00)
Glucose, Bld: 95 mg/dL (ref 70–99)
Potassium: 4.1 mEq/L (ref 3.5–5.1)
SODIUM: 140 meq/L (ref 135–145)
TOTAL PROTEIN: 6.8 g/dL (ref 6.0–8.3)

## 2017-05-17 LAB — LIPID PANEL
CHOLESTEROL: 172 mg/dL (ref 0–200)
HDL: 75.7 mg/dL (ref >39.00)
LDL CALC: 80 mg/dL (ref 0–99)
NonHDL: 96.1
TRIGLYCERIDES: 83 mg/dL (ref 0.0–149.0)
Total CHOL/HDL Ratio: 2
VLDL: 16.6 mg/dL (ref 0.0–40.0)

## 2017-05-17 NOTE — Assessment & Plan Note (Signed)
Tolerating statin, encouraged heart healthy diet, avoid trans fats, minimize simple carbs and saturated fats. Increase exercise as tolerated 

## 2017-05-17 NOTE — Progress Notes (Signed)
Subjective:  I acted as a Education administrator for [employer], [place]   Patient ID: Tina [name], female    DOB: 25-Apr-1946, 71 y.o.   MRN: [id number]  Chief Complaint  Patient presents with  . Follow-up  . Hypertension    HPI  Patient is in today for f/u bp--  bp running 120/ ? Usually.  Today it is running higher and she does not know why.  Pt feeling better with lower dose of altace.    Patient Care Team: Carollee Herter, Alferd Apa, DO as PCP - General Adron Bene, DDS as Consulting Physician (Dentistry) Shawnie Dapper, DO as Consulting Physician (Optometry) Fay Records, MD as Consulting Physician (Cardiology)   Past Medical History:  Diagnosis Date  . Fatty liver   . Goiter   . HTN (hypertension)   . Hyperlipidemia   . Myocardial infarct ([place]) hx of 2004  . Wrist fracture     Past Surgical History:  Procedure Laterality Date  . CARDIAC CATHETERIZATION  2004   stent  . sleep study  2012    Family History  Problem Relation Age of Onset  . Hypertension Mother   . Heart disease Mother 67       MI--- died from #3 at 75  . Ovarian cancer Mother   . Cancer Mother   . Hypertension Father   . Cancer Father 42       ovarian  . Heart disease Father   . Hypertension Sister   . Cancer Sister 41       brain tumor  . Coronary artery disease Unknown   . Lung cancer Unknown   . Ovarian cancer Unknown   . Diabetes Maternal Grandmother   . Hypertension Brother   . Arthritis Brother     Social History   Socioeconomic History  . Marital status: Divorced    Spouse name: Not on file  . Number of children: Not on file  . Years of education: Not on file  . Highest education level: Not on file  Social Needs  . Financial resource strain: Not on file  . Food insecurity - worry: Not on file  . Food insecurity - inability: Not on file  . Transportation needs - medical: Not on file  . Transportation needs - non-medical: Not on file  Occupational History  . Occupation: Hydrographic surveyor: OTHER    Comment: [employer]   Tobacco Use  . Smoking status: Former Smoker    Packs/day: 1.00    Years: 30.00    Pack years: 30.00    Last attempt to quit: 12/13/2002    Years since quitting: 14.4  . Smokeless tobacco: Former Systems developer    Quit date: 06/01/2002  Substance and Sexual Activity  . Alcohol use: No  . Drug use: No  . Sexual activity: Not Currently    Partners: Male  Other Topics Concern  . Not on file  Social History Narrative   Occupation:[employer]   Divorced   Former Smoker quit 2004   Drug use-no   Regular exercise-treadmill 3x a week    Outpatient Medications Prior to Visit  Medication Sig Dispense Refill  . alendronate (FOSAMAX) 70 MG tablet TAKE 1 TABLET BY MOUTH EVERY 7 DAYS. TAKE WITH A FULL GLASS OF WATER ON AN EMPTY STOMACH 4 tablet 5  . alendronate (FOSAMAX) 70 MG tablet TAKE 1 TABLET BY MOUTH EVERY 7 DAYS ON EMPTY STOMACH WITH A FULL GLASS OF  WATER 4 tablet 0  . aspirin 81 MG tablet Take 81 mg by mouth daily.      . Calcium Carbonate-Vit D-Min (CALCIUM 1200 PO) Take 1,200 mg by mouth daily. 1 tab po qd    . Fish Oil OIL 2 tabs po qd     . Multiple Vitamin (MULTIVITAMIN) capsule Take 1 capsule by mouth daily.      . ramipril (ALTACE) 5 MG capsule Take 1 capsule (5 mg total) by mouth daily. 90 capsule 3  . rosuvastatin (CRESTOR) 10 MG tablet Take 10 mg by mouth daily.     No facility-administered medications prior to visit.     No Known Allergies  Review of Systems  Constitutional: Negative for chills, fever and malaise/fatigue.  HENT: Negative for congestion and hearing loss.   Eyes: Negative for discharge.  Respiratory: Negative for cough, sputum production and shortness of breath.   Cardiovascular: Negative for chest pain, palpitations and leg swelling.  Gastrointestinal: Negative for abdominal pain, blood in stool, constipation, diarrhea, heartburn, nausea and vomiting.  Genitourinary: Negative for dysuria, frequency,  hematuria and urgency.  Musculoskeletal: Negative for back pain, falls and myalgias.  Skin: Negative for rash.  Neurological: Negative for dizziness, sensory change, loss of consciousness, weakness and headaches.  Endo/Heme/Allergies: Negative for environmental allergies. Does not bruise/bleed easily.  Psychiatric/Behavioral: Negative for depression and suicidal ideas. The patient is not nervous/anxious and does not have insomnia.        Objective:    Physical Exam  Constitutional: She is oriented to person, place, and time. She appears well-developed and well-nourished.  HENT:  Head: Normocephalic and atraumatic.  Eyes: Conjunctivae and EOM are normal.  Neck: Normal range of motion. Neck supple. No JVD present. Carotid bruit is not present. No thyromegaly present.  Cardiovascular: Normal rate, regular rhythm and normal heart sounds.  No murmur heard. Pulmonary/Chest: Effort normal and breath sounds normal. No respiratory distress. She has no wheezes. She has no rales. She exhibits no tenderness.  Musculoskeletal: She exhibits no edema.  Neurological: She is alert and oriented to person, place, and time.  Psychiatric: She has a normal mood and affect.  Nursing note and vitals reviewed.   BP (!) 150/78   Pulse 60   Temp (!) 97.4 F (36.3 C) (Oral)   Ht 5\' 5"  (1.651 m)   Wt 172 lb (78 kg)   SpO2 98%   BMI 28.62 kg/m  Wt Readings from Last 3 Encounters:  05/17/17 172 lb (78 kg)  02/24/17 169 lb (76.7 kg)  02/23/17 168 lb 12.8 oz (76.6 kg)   BP Readings from Last 3 Encounters:  05/17/17 (!) 150/78  03/09/17 (!) 145/71  02/24/17 115/63     Immunization History  Administered Date(s) Administered  . Influenza, High Dose Seasonal PF 02/25/2015, 04/13/2016, 02/24/2017  . Influenza,inj,Quad PF,6+ Mos 06/05/2014  . Influenza-Unspecified 03/30/2017  . Pneumococcal Conjugate-13 02/25/2015  . Pneumococcal Polysaccharide-23 01/03/2014  . Tdap 10/14/2010  . Zoster 10/14/2010     Health Maintenance  Topic Date Due  . Fecal DNA (Cologuard)  06/27/2017  . MAMMOGRAM  10/31/2018  . TETANUS/TDAP  10/13/2020  . INFLUENZA VACCINE  Completed  . DEXA SCAN  Completed  . Hepatitis C Screening  Completed  . PNA vac Low Risk Adult  Completed    Lab Results  Component Value Date   WBC 8.1 02/24/2017   HGB 14.4 02/24/2017   HCT 42.8 02/24/2017   PLT 254.0 02/24/2017   GLUCOSE 92 02/24/2017  CHOL 148 02/24/2017   TRIG 67.0 02/24/2017   HDL 61.50 02/24/2017   LDLDIRECT 123.7 05/30/2013   LDLCALC 73 02/24/2017   ALT 16 02/24/2017   AST 20 02/24/2017   NA 136 02/24/2017   K 4.6 02/24/2017   CL 98 02/24/2017   CREATININE 0.66 02/24/2017   BUN 13 02/24/2017   CO2 29 02/24/2017   TSH 1.07 02/24/2017    Lab Results  Component Value Date   TSH 1.07 02/24/2017   Lab Results  Component Value Date   WBC 8.1 02/24/2017   HGB 14.4 02/24/2017   HCT 42.8 02/24/2017   MCV 92.8 02/24/2017   PLT 254.0 02/24/2017   Lab Results  Component Value Date   NA 136 02/24/2017   K 4.6 02/24/2017   CO2 29 02/24/2017   GLUCOSE 92 02/24/2017   BUN 13 02/24/2017   CREATININE 0.66 02/24/2017   BILITOT 0.6 02/24/2017   ALKPHOS 41 02/24/2017   AST 20 02/24/2017   ALT 16 02/24/2017   PROT 7.6 02/24/2017   ALBUMIN 4.7 02/24/2017   CALCIUM 10.3 02/24/2017   GFR 94.11 02/24/2017   Lab Results  Component Value Date   CHOL 148 02/24/2017   Lab Results  Component Value Date   HDL 61.50 02/24/2017   Lab Results  Component Value Date   LDLCALC 73 02/24/2017   Lab Results  Component Value Date   TRIG 67.0 02/24/2017   Lab Results  Component Value Date   CHOLHDL 2 02/24/2017   No results found for: HGBA1C       Assessment & Plan:   Problem List Items Addressed This Visit      Unprioritized   Essential hypertension    Well controlled, no changes to meds. Encouraged heart healthy diet such as the DASH diet and exercise as tolerated. -- usually runs slightly  higher in drs office      Hyperlipidemia LDL goal <100 - Primary    Tolerating statin, encouraged heart healthy diet, avoid trans fats, minimize simple carbs and saturated fats. Increase exercise as tolerated      Relevant Orders   Lipid panel   Comprehensive metabolic panel      I am having Tina [name] maintain her aspirin, Calcium Carbonate-Vit D-Min (CALCIUM 1200 PO), Fish Oil, multivitamin, alendronate, ramipril, rosuvastatin, and alendronate.  No orders of the defined types were placed in this encounter.   CMA served as Education administrator during this visit. History, Physical and Plan performed by medical provider. Documentation and orders reviewed and attested to.  Ann Held, DO

## 2017-05-17 NOTE — Patient Instructions (Signed)

## 2017-05-17 NOTE — Assessment & Plan Note (Signed)
Well controlled, no changes to meds. Encouraged heart healthy diet such as the DASH diet and exercise as tolerated. -- usually runs slightly higher in drs office

## 2017-05-26 ENCOUNTER — Other Ambulatory Visit: Payer: Self-pay | Admitting: Family Medicine

## 2017-06-24 ENCOUNTER — Other Ambulatory Visit: Payer: Self-pay | Admitting: Family Medicine

## 2017-07-20 ENCOUNTER — Other Ambulatory Visit: Payer: Self-pay | Admitting: Family Medicine

## 2017-08-23 DIAGNOSIS — H02832 Dermatochalasis of right lower eyelid: Secondary | ICD-10-CM | POA: Diagnosis not present

## 2017-08-23 DIAGNOSIS — H40023 Open angle with borderline findings, high risk, bilateral: Secondary | ICD-10-CM | POA: Diagnosis not present

## 2017-08-23 DIAGNOSIS — H2513 Age-related nuclear cataract, bilateral: Secondary | ICD-10-CM | POA: Diagnosis not present

## 2017-08-23 DIAGNOSIS — H02831 Dermatochalasis of right upper eyelid: Secondary | ICD-10-CM | POA: Diagnosis not present

## 2017-08-23 DIAGNOSIS — H5213 Myopia, bilateral: Secondary | ICD-10-CM | POA: Diagnosis not present

## 2018-02-23 NOTE — Progress Notes (Signed)
Subjective:   Tina [name] is a 72 y.o. female who presents for Medicare Annual (Subsequent) preventive examination.  Review of Systems: No ROS.  Medicare Wellness Visit. Additional risk factors are reflected in the social history. Cardiac Risk Factors include: advanced age (>55men, >13 women);dyslipidemia;hypertension Sleep patterns: still having sleep issues. Does not want to try meds. Healthy sleep habits discussed. Generally sleeps at least 6 hrs. Home Safety/Smoke Alarms: Feels safe in home. Smoke alarms in place. Lives alone in 1 story home. Pt mows her own yard.  Female:        Mammo- pt states she will schedule       Dexa scan- pt states she will schedule.       [facility]- ordered Eye- yearly. Next appt 06/2018.    Objective:     Vitals: BP (!) 148/89 (BP Location: Left Arm, Patient Position: Sitting, Cuff Size: Normal)   Pulse 63   Ht 5\' 5"  (1.651 m)   Wt 189 lb 6.4 oz (85.9 kg)   SpO2 96%   BMI 31.52 kg/m   Body mass index is 31.52 kg/m.  Advanced Directives 02/24/2018 02/23/2017 10/11/2015  Does Patient Have a Medical Advance Directive? No No No  Would patient like information on creating a medical advance directive? Yes (MAU/Ambulatory/Procedural Areas - Information given) Yes (MAU/Ambulatory/Procedural Areas - Information given) No - patient declined information    Tobacco Social History   Tobacco Use  Smoking Status Former Smoker  . Packs/day: 1.00  . Years: 30.00  . Pack years: 30.00  . Last attempt to quit: 12/13/2002  . Years since quitting: 15.2  Smokeless Tobacco Former Systems developer  . Quit date: 06/01/2002     Counseling given: Not Answered   Clinical Intake: Pain : No/denies pain    Past Medical History:  Diagnosis Date  . Fatty liver   . Goiter   . HTN (hypertension)   . Hyperlipidemia   . Myocardial infarct ([place]) hx of 2004  . Wrist fracture    Past Surgical History:  Procedure Laterality Date  . CARDIAC CATHETERIZATION  2004   stent  . sleep  study  2012   Family History  Problem Relation Age of Onset  . Hypertension Mother   . Heart disease Mother 72       MI--- died from #3 at 38  . Ovarian cancer Mother   . Cancer Mother   . Hypertension Father   . Cancer Father 33       ovarian  . Heart disease Father   . Hypertension Sister   . Cancer Sister 50       brain tumor  . Coronary artery disease Unknown   . Lung cancer Unknown   . Ovarian cancer Unknown   . Diabetes Maternal Grandmother   . Hypertension Brother   . Arthritis Brother    Social History   Socioeconomic History  . Marital status: Divorced    Spouse name: Not on file  . Number of children: Not on file  . Years of education: Not on file  . Highest education level: Not on file  Occupational History  . Occupation: Optician, dispensing: OTHER    Comment: [employer]   Social Needs  . Financial resource strain: Not on file  . Food insecurity:    Worry: Not on file    Inability: Not on file  . Transportation needs:    Medical: Not on file    Non-medical: Not on file  Tobacco Use  . Smoking status: Former Smoker    Packs/day: 1.00    Years: 30.00    Pack years: 30.00    Last attempt to quit: 12/13/2002    Years since quitting: 15.2  . Smokeless tobacco: Former Systems developer    Quit date: 06/01/2002  Substance and Sexual Activity  . Alcohol use: Yes    Comment: 1-2 glasses of wine with dinner  . Drug use: No  . Sexual activity: Not Currently    Partners: Male  Lifestyle  . Physical activity:    Days per week: Not on file    Minutes per session: Not on file  . Stress: Not on file  Relationships  . Social connections:    Talks on phone: Not on file    Gets together: Not on file    Attends religious service: Not on file    Active member of club or organization: Not on file    Attends meetings of clubs or organizations: Not on file    Relationship status: Not on file  Other Topics Concern  . Not on file  Social History Narrative    Occupation:[employer]   Divorced   Former Smoker quit 2004   Drug use-no   Regular exercise-treadmill 3x a week    Outpatient Encounter Medications as of 02/24/2018  Medication Sig  . alendronate (FOSAMAX) 70 MG tablet TAKE 1 TABLET BY MOUTH EVERY 7 DAYS ON EMPTY STOMACH WITH A FULL GLASS OF WATER  . aspirin 81 MG tablet Take 81 mg by mouth daily.    . Calcium Carbonate-Vit D-Min (CALCIUM 1200 PO) Take 1,200 mg by mouth daily. 1 tab po qd  . Fish Oil OIL 2 tabs po qd   . Multiple Vitamin (MULTIVITAMIN) capsule Take 1 capsule by mouth daily.    . ramipril (ALTACE) 5 MG capsule Take 1 capsule (5 mg total) by mouth daily.  . rosuvastatin (CRESTOR) 20 MG tablet Take 0.5 tablets (10 mg total) by mouth daily.   No facility-administered encounter medications on file as of 02/24/2018.     Activities of Daily Living In your present state of health, do you have any difficulty performing the following activities: 02/24/2018  Hearing? N  Vision? N  Difficulty concentrating or making decisions? N  Walking or climbing stairs? N  Dressing or bathing? N  Doing errands, shopping? N  Preparing Food and eating ? N  Using the Toilet? N  In the past six months, have you accidently leaked urine? Y  Do you have problems with loss of bowel control? N  Managing your Medications? N  Managing your Finances? N  Housekeeping or managing your Housekeeping? N  Some recent data might be hidden    Patient Care Team: Carollee Herter, Alferd Apa, DO as PCP - Walls, DDS as Consulting Physician (Dentistry) Jerline Pain Mingo Amber, DO as Consulting Physician (Optometry) Fay Records, MD as Consulting Physician (Cardiology)    Assessment:   This is a routine wellness examination for [name]. Physical assessment deferred to PCP.  Exercise Activities and Dietary recommendations Current Exercise Habits: Home exercise routine, Type of exercise: treadmill, Time (Minutes): 30, Frequency (Times/Week): 5,  Weekly Exercise (Minutes/Week): 150, Intensity: Mild, Exercise limited by: None identified Diet (meal preparation, eat out, water intake, caffeinated beverages, dairy products, fruits and vegetables): 24 hour recall Breakfast: toast and fruit Lunch: skipped Dinner:  Hot dogs and brussels sprouts Snack: cheese and crackers    Goals    .  Continue healthy diet    . Increase physical activity       Fall Risk Fall Risk  02/24/2018 02/23/2017 10/11/2015 06/05/2014 06/05/2014  Falls in the past year? No No Yes No No  Number falls in past yr: - - 1 - -  Injury with Fall? - - Yes - -  Risk for fall due to : - - - - -  Risk for fall due to: Comment - - - - -    Depression Screen PHQ 2/9 Scores 02/24/2018 02/23/2017 04/13/2016 10/11/2015  PHQ - 2 Score 0 0 0 0     Cognitive Function Ad8 score reviewed for issues:  Issues making decisions:no  Less interest in hobbies / activities:no  Repeats questions, stories (family complaining):no  Trouble using ordinary gadgets (microwave, computer, phone):no  Forgets the month or year: no  Mismanaging finances: no  Remembering appts:no  Daily problems with thinking and/or memory:no Ad8 score is=0       Immunization History  Administered Date(s) Administered  . Influenza, High Dose Seasonal PF 02/25/2015, 04/13/2016, 02/24/2017  . Influenza,inj,Quad PF,6+ Mos 06/05/2014  . Influenza-Unspecified 03/30/2017  . Pneumococcal Conjugate-13 02/25/2015  . Pneumococcal Polysaccharide-23 01/03/2014  . Tdap 10/14/2010  . Zoster 10/14/2010    Screening Tests Health Maintenance  Topic Date Due  . Fecal DNA (Cologuard)  06/27/2017  . INFLUENZA VACCINE  12/30/2017  . MAMMOGRAM  10/31/2018  . TETANUS/TDAP  10/13/2020  . DEXA SCAN  Completed  . Hepatitis C Screening  Completed  . PNA vac Low Risk Adult  Completed       Plan:    Please schedule your next medicare wellness visit with me in 1 yr.  Schedule appointment ASAP to follow up with  Dr.Lowne as discussed.  I have ordered your Cologuard.  Please schedule your mammogram and bone density scan.  Eat heart healthy diet (full of fruits, vegetables, whole grains, lean protein, water--limit salt, fat, and sugar intake) and increase physical activity as tolerated.    I have personally reviewed and noted the following in the patient's chart:   . Medical and social history . Use of alcohol, tobacco or illicit drugs  . Current medications and supplements . Functional ability and status . Nutritional status . Physical activity . Advanced directives . List of other physicians . Hospitalizations, surgeries, and ER visits in previous 12 months . Vitals . Screenings to include cognitive, depression, and falls . Referrals and appointments  In addition, I have reviewed and discussed with patient certain preventive protocols, quality metrics, and best practice recommendations. A written personalized care plan for preventive services as well as general preventive health recommendations were provided to patient.     Shela Nevin, [place]  02/24/2018

## 2018-02-24 ENCOUNTER — Telehealth: Payer: Self-pay

## 2018-02-24 ENCOUNTER — Other Ambulatory Visit: Payer: Self-pay | Admitting: Family Medicine

## 2018-02-24 ENCOUNTER — Ambulatory Visit (INDEPENDENT_AMBULATORY_CARE_PROVIDER_SITE_OTHER): Payer: Medicare Other | Admitting: *Deleted

## 2018-02-24 ENCOUNTER — Encounter: Payer: Self-pay | Admitting: *Deleted

## 2018-02-24 VITALS — BP 148/89 | HR 63 | Ht 65.0 in | Wt 189.4 lb

## 2018-02-24 DIAGNOSIS — E2839 Other primary ovarian failure: Secondary | ICD-10-CM

## 2018-02-24 DIAGNOSIS — Z0000 Encounter for general adult medical examination without abnormal findings: Secondary | ICD-10-CM | POA: Diagnosis not present

## 2018-02-24 DIAGNOSIS — Z1211 Encounter for screening for malignant neoplasm of colon: Secondary | ICD-10-CM

## 2018-02-24 NOTE — Patient Instructions (Signed)
Please schedule your next medicare wellness visit with me in 1 yr.  Schedule appointment ASAP to follow up with Dr.Lowne as discussed.  I have ordered your Cologuard.  Please schedule your mammogram and bone density scan.  Eat heart healthy diet (full of fruits, vegetables, whole grains, lean protein, water--limit salt, fat, and sugar intake) and increase physical activity as tolerated.   Tina [name] , Thank you for taking time to come for your Medicare Wellness Visit. I appreciate your ongoing commitment to your health goals. Please review the following plan we discussed and let me know if I can assist you in the future.   These are the goals we discussed: Goals    . Continue healthy diet    . Increase physical activity       This is a list of the screening recommended for you and due dates:  Health Maintenance  Topic Date Due  . Cologuard (Stool DNA test)  06/27/2017  . Flu Shot  12/30/2017  . Mammogram  10/31/2018  . Tetanus Vaccine  10/13/2020  . DEXA scan (bone density measurement)  Completed  .  Hepatitis C: One time screening is recommended by Center for Disease Control  (CDC) for  adults born from [birth year] through 1964.   Completed  . Pneumonia vaccines  Completed    Health Maintenance for Postmenopausal Women Menopause is a normal process in which your reproductive ability comes to an end. This process happens gradually over a span of months to years, usually between the ages of 43 and 66. Menopause is complete when you have missed 12 consecutive menstrual periods. It is important to talk with your health care provider about some of the most common conditions that affect postmenopausal women, such as heart disease, cancer, and bone loss (osteoporosis). Adopting a healthy lifestyle and getting preventive care can help to promote your health and wellness. Those actions can also lower your chances of developing some of these common conditions. What should I know about  menopause? During menopause, you may experience a number of symptoms, such as:  Moderate-to-severe hot flashes.  Night sweats.  Decrease in sex drive.  Mood swings.  Headaches.  Tiredness.  Irritability.  Memory problems.  Insomnia.  Choosing to treat or not to treat menopausal changes is an individual decision that you make with your health care provider. What should I know about hormone replacement therapy and supplements? Hormone therapy products are effective for treating symptoms that are associated with menopause, such as hot flashes and night sweats. Hormone replacement carries certain risks, especially as you become older. If you are thinking about using estrogen or estrogen with progestin treatments, discuss the benefits and risks with your health care provider. What should I know about heart disease and stroke? Heart disease, heart attack, and stroke become more likely as you age. This may be due, in part, to the hormonal changes that your body experiences during menopause. These can affect how your body processes dietary fats, triglycerides, and cholesterol. Heart attack and stroke are both medical emergencies. There are many things that you can do to help prevent heart disease and stroke:  Have your blood pressure checked at least every 1-2 years. High blood pressure causes heart disease and increases the risk of stroke.  If you are 81-44 years old, ask your health care provider if you should take aspirin to prevent a heart attack or a stroke.  Do not use any tobacco products, including cigarettes, chewing tobacco, or electronic cigarettes. If  you need help quitting, ask your health care provider.  It is important to eat a healthy diet and maintain a healthy weight. ? Be sure to include plenty of vegetables, fruits, low-fat dairy products, and lean protein. ? Avoid eating foods that are high in solid fats, added sugars, or salt (sodium).  Get regular exercise. This  is one of the most important things that you can do for your health. ? Try to exercise for at least 150 minutes each week. The type of exercise that you do should increase your heart rate and make you sweat. This is known as moderate-intensity exercise. ? Try to do strengthening exercises at least twice each week. Do these in addition to the moderate-intensity exercise.  Know your numbers.Ask your health care provider to check your cholesterol and your blood glucose. Continue to have your blood tested as directed by your health care provider.  What should I know about cancer screening? There are several types of cancer. Take the following steps to reduce your risk and to catch any cancer development as early as possible. Breast Cancer  Practice breast self-awareness. ? This means understanding how your breasts normally appear and feel. ? It also means doing regular breast self-exams. Let your health care provider know about any changes, no matter how small.  If you are 22 or older, have a clinician do a breast exam (clinical breast exam or CBE) every year. Depending on your age, family history, and medical history, it may be recommended that you also have a yearly breast X-ray (mammogram).  If you have a family history of breast cancer, talk with your health care provider about genetic screening.  If you are at high risk for breast cancer, talk with your health care provider about having an MRI and a mammogram every year.  Breast cancer (BRCA) gene test is recommended for women who have family members with BRCA-related cancers. Results of the assessment will determine the need for genetic counseling and BRCA1 and for BRCA2 testing. BRCA-related cancers include these types: ? Breast. This occurs in males or females. ? Ovarian. ? Tubal. This may also be called fallopian tube cancer. ? Cancer of the abdominal or pelvic lining (peritoneal cancer). ? Prostate. ? Pancreatic.  Cervical,  Uterine, and Ovarian Cancer Your health care provider may recommend that you be screened regularly for cancer of the pelvic organs. These include your ovaries, uterus, and vagina. This screening involves a pelvic exam, which includes checking for microscopic changes to the surface of your cervix (Pap test).  For women ages 21-65, health care providers may recommend a pelvic exam and a Pap test every three years. For women ages 53-65, they may recommend the Pap test and pelvic exam, combined with testing for human papilloma virus (HPV), every five years. Some types of HPV increase your risk of cervical cancer. Testing for HPV may also be done on women of any age who have unclear Pap test results.  Other health care providers may not recommend any screening for nonpregnant women who are considered low risk for pelvic cancer and have no symptoms. Ask your health care provider if a screening pelvic exam is right for you.  If you have had past treatment for cervical cancer or a condition that could lead to cancer, you need Pap tests and screening for cancer for at least 20 years after your treatment. If Pap tests have been discontinued for you, your risk factors (such as having a new sexual partner) need to  be reassessed to determine if you should start having screenings again. Some women have medical problems that increase the chance of getting cervical cancer. In these cases, your health care provider may recommend that you have screening and Pap tests more often.  If you have a family history of uterine cancer or ovarian cancer, talk with your health care provider about genetic screening.  If you have vaginal bleeding after reaching menopause, tell your health care provider.  There are currently no reliable tests available to screen for ovarian cancer.  Lung Cancer Lung cancer screening is recommended for adults 38-98 years old who are at high risk for lung cancer because of a history of smoking. A  yearly low-dose CT scan of the lungs is recommended if you:  Currently smoke.  Have a history of at least 30 pack-years of smoking and you currently smoke or have quit within the past 15 years. A pack-year is smoking an average of one pack of cigarettes per day for one year.  Yearly screening should:  Continue until it has been 15 years since you quit.  Stop if you develop a health problem that would prevent you from having lung cancer treatment.  Colorectal Cancer  This type of cancer can be detected and can often be prevented.  Routine colorectal cancer screening usually begins at age 76 and continues through age 59.  If you have risk factors for colon cancer, your health care provider may recommend that you be screened at an earlier age.  If you have a family history of colorectal cancer, talk with your health care provider about genetic screening.  Your health care provider may also recommend using home test kits to check for hidden blood in your stool.  A small camera at the end of a tube can be used to examine your colon directly (sigmoidoscopy or colonoscopy). This is done to check for the earliest forms of colorectal cancer.  Direct examination of the colon should be repeated every 5-10 years until age 39. However, if early forms of precancerous polyps or small growths are found or if you have a family history or genetic risk for colorectal cancer, you may need to be screened more often.  Skin Cancer  Check your skin from head to toe regularly.  Monitor any moles. Be sure to tell your health care provider: ? About any new moles or changes in moles, especially if there is a change in a mole's shape or color. ? If you have a mole that is larger than the size of a pencil eraser.  If any of your family members has a history of skin cancer, especially at a young age, talk with your health care provider about genetic screening.  Always use sunscreen. Apply sunscreen liberally  and repeatedly throughout the day.  Whenever you are outside, protect yourself by wearing long sleeves, pants, a wide-brimmed hat, and sunglasses.  What should I know about osteoporosis? Osteoporosis is a condition in which bone destruction happens more quickly than new bone creation. After menopause, you may be at an increased risk for osteoporosis. To help prevent osteoporosis or the bone fractures that can happen because of osteoporosis, the following is recommended:  If you are 56-89 years old, get at least 1,000 mg of calcium and at least 600 mg of vitamin D per day.  If you are older than age 50 but younger than age 27, get at least 1,200 mg of calcium and at least 600 mg of vitamin D  per day.  If you are older than age 65, get at least 1,200 mg of calcium and at least 800 mg of vitamin D per day.  Smoking and excessive alcohol intake increase the risk of osteoporosis. Eat foods that are rich in calcium and vitamin D, and do weight-bearing exercises several times each week as directed by your health care provider. What should I know about how menopause affects my mental health? Depression may occur at any age, but it is more common as you become older. Common symptoms of depression include:  Low or sad mood.  Changes in sleep patterns.  Changes in appetite or eating patterns.  Feeling an overall lack of motivation or enjoyment of activities that you previously enjoyed.  Frequent crying spells.  Talk with your health care provider if you think that you are experiencing depression. What should I know about immunizations? It is important that you get and maintain your immunizations. These include:  Tetanus, diphtheria, and pertussis (Tdap) booster vaccine.  Influenza every year before the flu season begins.  Pneumonia vaccine.  Shingles vaccine.  Your health care provider may also recommend other immunizations. This information is not intended to replace advice given to you  by your health care provider. Make sure you discuss any questions you have with your health care provider. Document Released: 07/10/2005 Document Revised: 12/06/2015 Document Reviewed: 02/19/2015 Elsevier Interactive Patient Education  2018 [employer].

## 2018-02-24 NOTE — Telephone Encounter (Signed)
Please advise if okay to order.

## 2018-02-24 NOTE — Progress Notes (Signed)
Reviewed  Tina R Lowne Chase, DO  

## 2018-02-24 NOTE — Telephone Encounter (Signed)
I ordered.

## 2018-02-24 NOTE — Telephone Encounter (Signed)
Copied from [place] [id number]. Topic: General - Other >> Feb 24, 2018 10:59 AM Yvette Rack wrote: Reason for CRM: Pt states she tried to schedule an appt for bone density exam but she was told it had to be scheduled by her doctor. Cb# [phone]

## 2018-02-28 ENCOUNTER — Encounter: Payer: Self-pay | Admitting: Family Medicine

## 2018-02-28 ENCOUNTER — Other Ambulatory Visit: Payer: Self-pay | Admitting: Family Medicine

## 2018-02-28 ENCOUNTER — Ambulatory Visit: Payer: Medicare Other | Admitting: Family Medicine

## 2018-02-28 VITALS — BP 131/52 | HR 58 | Temp 98.8°F | Resp 16 | Ht 65.0 in | Wt 187.8 lb

## 2018-02-28 DIAGNOSIS — E049 Nontoxic goiter, unspecified: Secondary | ICD-10-CM | POA: Diagnosis not present

## 2018-02-28 DIAGNOSIS — Z23 Encounter for immunization: Secondary | ICD-10-CM | POA: Diagnosis not present

## 2018-02-28 DIAGNOSIS — I10 Essential (primary) hypertension: Secondary | ICD-10-CM

## 2018-02-28 DIAGNOSIS — E785 Hyperlipidemia, unspecified: Secondary | ICD-10-CM | POA: Diagnosis not present

## 2018-02-28 DIAGNOSIS — Z1231 Encounter for screening mammogram for malignant neoplasm of breast: Secondary | ICD-10-CM

## 2018-02-28 LAB — COMPREHENSIVE METABOLIC PANEL
ALT: 16 U/L (ref 0–35)
AST: 21 U/L (ref 0–37)
Albumin: 4.5 g/dL (ref 3.5–5.2)
Alkaline Phosphatase: 41 U/L (ref 39–117)
BUN: 15 mg/dL (ref 6–23)
CHLORIDE: 101 meq/L (ref 96–112)
CO2: 30 meq/L (ref 19–32)
CREATININE: 0.62 mg/dL (ref 0.40–1.20)
Calcium: 9.6 mg/dL (ref 8.4–10.5)
GFR: 100.86 mL/min (ref >60.00)
Glucose, Bld: 94 mg/dL (ref 70–99)
Potassium: 3.9 mEq/L (ref 3.5–5.1)
Sodium: 139 mEq/L (ref 135–145)
Total Bilirubin: 0.6 mg/dL (ref 0.2–1.2)
Total Protein: 7 g/dL (ref 6.0–8.3)

## 2018-02-28 LAB — LIPID PANEL
CHOL/HDL RATIO: 2
Cholesterol: 168 mg/dL (ref 0–200)
HDL: 73.3 mg/dL (ref >39.00)
LDL CALC: 79 mg/dL (ref 0–99)
NonHDL: 95.08
Triglycerides: 82 mg/dL (ref 0.0–149.0)
VLDL: 16.4 mg/dL (ref 0.0–40.0)

## 2018-02-28 LAB — TSH: TSH: 1.57 u[IU]/mL (ref 0.35–4.50)

## 2018-02-28 NOTE — Progress Notes (Signed)
Patient ID: Tina [name], female    DOB: 1945/12/31  Age: 72 y.o. MRN: [id number]    Subjective:  Subjective  HPI Tina [name] presents for f/u chol and bp.  No complaints  Review of Systems  Constitutional: Negative for chills and fever.  HENT: Negative for congestion and hearing loss.   Eyes: Negative for discharge.  Respiratory: Negative for cough and shortness of breath.   Cardiovascular: Negative for chest pain, palpitations and leg swelling.  Gastrointestinal: Negative for abdominal pain, blood in stool, constipation, diarrhea, nausea and vomiting.  Genitourinary: Negative for dysuria, frequency, hematuria and urgency.  Musculoskeletal: Negative for back pain and myalgias.  Skin: Negative for rash.  Allergic/Immunologic: Negative for environmental allergies.  Neurological: Negative for dizziness, weakness and headaches.  Hematological: Does not bruise/bleed easily.  Psychiatric/Behavioral: Negative for suicidal ideas. The patient is not nervous/anxious.     History Past Medical History:  Diagnosis Date  . Fatty liver   . Goiter   . HTN (hypertension)   . Hyperlipidemia   . Myocardial infarct ([place]) hx of 2004  . Wrist fracture     She has a past surgical history that includes Cardiac catheterization (2004) and sleep study (2012).   Her family history includes Arthritis in her brother; Cancer in her mother; Cancer (age of onset: 44) in her sister; Cancer (age of onset: 14) in her father; Coronary artery disease in her unknown relative; Diabetes in her maternal grandmother; Heart disease in her father; Heart disease (age of onset: 43) in her mother; Hypertension in her brother, father, mother, and sister; Lung cancer in her unknown relative; Ovarian cancer in her mother and unknown relative.She reports that she quit smoking about 15 years ago. She has a 30.00 pack-year smoking history. She quit smokeless tobacco use about 15 years ago. She reports that she drinks alcohol. She  reports that she does not use drugs.  Current Outpatient Medications on File Prior to Visit  Medication Sig Dispense Refill  . alendronate (FOSAMAX) 70 MG tablet TAKE 1 TABLET BY MOUTH EVERY 7 DAYS ON EMPTY STOMACH WITH A FULL GLASS OF WATER 12 tablet 1  . aspirin 81 MG tablet Take 81 mg by mouth daily.      . Calcium Carbonate-Vit D-Min (CALCIUM 1200 PO) Take 1,200 mg by mouth daily. 1 tab po qd    . Collagenase POWD by Does not apply route.    . Fish Oil OIL 2 tabs po qd     . KRILL OIL PO Take 1 capsule by mouth daily.    . Multiple Vitamin (MULTIVITAMIN) capsule Take 1 capsule by mouth daily.      . ramipril (ALTACE) 5 MG capsule Take 1 capsule (5 mg total) by mouth daily. 90 capsule 3  . rosuvastatin (CRESTOR) 20 MG tablet Take 0.5 tablets (10 mg total) by mouth daily. 45 tablet 1   No current facility-administered medications on file prior to visit.      Objective:  Objective  Physical Exam  Constitutional: She is oriented to person, place, and time. She appears well-developed and well-nourished.  HENT:  Head: Normocephalic and atraumatic.  Eyes: Conjunctivae and EOM are normal.  Neck: Normal range of motion. Neck supple. No JVD present. Carotid bruit is not present. No thyromegaly present.  Cardiovascular: Normal rate, regular rhythm and normal heart sounds.  No murmur heard. Pulmonary/Chest: Effort normal and breath sounds normal. No respiratory distress. She has no wheezes. She has no rales. She exhibits no tenderness.  Musculoskeletal: She exhibits no edema.  Neurological: She is alert and oriented to person, place, and time.  Psychiatric: She has a normal mood and affect.  Nursing note and vitals reviewed.  BP (!) 131/52 (BP Location: Left Arm, Cuff Size: Normal)   Pulse (!) 58   Temp 98.8 F (37.1 C) (Oral)   Resp 16   Ht 5\' 5"  (1.651 m)   Wt 187 lb 12.8 oz (85.2 kg)   SpO2 100%   BMI 31.25 kg/m  Wt Readings from Last 3 Encounters:  02/28/18 187 lb 12.8 oz  (85.2 kg)  02/24/18 189 lb 6.4 oz (85.9 kg)  05/17/17 172 lb (78 kg)     Lab Results  Component Value Date   WBC 8.1 02/24/2017   HGB 14.4 02/24/2017   HCT 42.8 02/24/2017   PLT 254.0 02/24/2017   GLUCOSE 95 05/17/2017   CHOL 172 05/17/2017   TRIG 83.0 05/17/2017   HDL 75.70 05/17/2017   LDLDIRECT 123.7 05/30/2013   LDLCALC 80 05/17/2017   ALT 15 05/17/2017   AST 19 05/17/2017   NA 140 05/17/2017   K 4.1 05/17/2017   CL 103 05/17/2017   CREATININE 0.68 05/17/2017   BUN 20 05/17/2017   CO2 31 05/17/2017   TSH 1.07 02/24/2017    US Transvaginal Non-ob  Result Date: 11/03/2016 CLINICAL DATA:  Sporadic postmenopausal vaginal spotting for 1 month, endometrial biopsy 6 days ago, mother had ovarian cancer EXAM: TRANSABDOMINAL AND TRANSVAGINAL ULTRASOUND OF PELVIS TECHNIQUE: Both transabdominal and transvaginal ultrasound examinations of the pelvis were performed. Transabdominal technique was performed for global imaging of the pelvis including uterus, ovaries, adnexal regions, and pelvic cul-de-sac. It was necessary to proceed with endovaginal exam following the transabdominal exam to visualize the endometrium and ovaries. COMPARISON:  None FINDINGS: Uterus Measurements: 5.8 x 2.7 x 3.5 cm. Normal morphology without mass Endometrium Thickness: 4 mm thick.  Small amount of endometrial fluid Right ovary Not visualized on either transabdominal or endovaginal imaging question due to atrophy versus obscuration by bowel Left ovary Not visualized on either transabdominal or endovaginal imaging question due to atrophy versus obscuration by bowel. Other findings No abnormal free fluid. No adnexal masses. IMPRESSION: Small amount of nonspecific endometrial fluid. Otherwise negative exam. Electronically Signed   By: Lavonia Dana M.D.   On: 11/03/2016 14:15   US Pelvis Complete  Result Date: 11/03/2016 CLINICAL DATA:  Sporadic postmenopausal vaginal spotting for 1 month, endometrial biopsy 6 days ago,  mother had ovarian cancer EXAM: TRANSABDOMINAL AND TRANSVAGINAL ULTRASOUND OF PELVIS TECHNIQUE: Both transabdominal and transvaginal ultrasound examinations of the pelvis were performed. Transabdominal technique was performed for global imaging of the pelvis including uterus, ovaries, adnexal regions, and pelvic cul-de-sac. It was necessary to proceed with endovaginal exam following the transabdominal exam to visualize the endometrium and ovaries. COMPARISON:  None FINDINGS: Uterus Measurements: 5.8 x 2.7 x 3.5 cm. Normal morphology without mass Endometrium Thickness: 4 mm thick.  Small amount of endometrial fluid Right ovary Not visualized on either transabdominal or endovaginal imaging question due to atrophy versus obscuration by bowel Left ovary Not visualized on either transabdominal or endovaginal imaging question due to atrophy versus obscuration by bowel. Other findings No abnormal free fluid. No adnexal masses. IMPRESSION: Small amount of nonspecific endometrial fluid. Otherwise negative exam. Electronically Signed   By: Lavonia Dana M.D.   On: 11/03/2016 14:15     Assessment & Plan:  Plan  I am having Renna E. Lamere maintain her aspirin, Calcium  Carbonate-Vit D-Min (CALCIUM 1200 PO), Fish Oil, multivitamin, ramipril, rosuvastatin, alendronate, KRILL OIL PO, and Collagenase.  No orders of the defined types were placed in this encounter.   Problem List Items Addressed This Visit      Unprioritized   Essential hypertension    Well controlled, no changes to meds. Encouraged heart healthy diet such as the DASH diet and exercise as tolerated.       Relevant Orders   Comprehensive metabolic panel   Lipid panel   Hyperlipidemia LDL goal <100    Encouraged heart healthy diet, increase exercise, avoid trans fats, consider a krill oil cap daily       Other Visit Diagnoses    Hyperlipidemia, unspecified hyperlipidemia type    -  Primary   Relevant Orders   Comprehensive metabolic panel    Lipid panel   Goiter       Relevant Orders   TSH   Influenza vaccine administered       Relevant Orders   Flu vaccine HIGH DOSE PF (Fluzone High Dose) (Completed)      Follow-up: Return in about 6 months (around 08/29/2018), or if symptoms worsen or fail to improve, for annual exam, fasting.  Ann Held, DO

## 2018-02-28 NOTE — Assessment & Plan Note (Signed)
Well controlled, no changes to meds. Encouraged heart healthy diet such as the DASH diet and exercise as tolerated.  °

## 2018-02-28 NOTE — Assessment & Plan Note (Signed)
Encouraged heart healthy diet, increase exercise, avoid trans fats, consider a krill oil cap daily 

## 2018-02-28 NOTE — Patient Instructions (Signed)
DASH Eating Plan DASH stands for "Dietary Approaches to Stop Hypertension." The DASH eating plan is a healthy eating plan that has been shown to reduce high blood pressure (hypertension). It may also reduce your risk for type 2 diabetes, heart disease, and stroke. The DASH eating plan may also help with weight loss. What are tips for following this plan? General guidelines  Avoid eating more than 2,300 mg (milligrams) of salt (sodium) a day. If you have hypertension, you may need to reduce your sodium intake to 1,500 mg a day.  Limit alcohol intake to no more than 1 drink a day for nonpregnant women and 2 drinks a day for men. One drink equals 12 oz of beer, 5 oz of wine, or 1 oz of hard liquor.  Work with your health care provider to maintain a healthy body weight or to lose weight. Ask what an ideal weight is for you.  Get at least 30 minutes of exercise that causes your heart to beat faster (aerobic exercise) most days of the week. Activities may include walking, swimming, or biking.  Work with your health care provider or diet and nutrition specialist (dietitian) to adjust your eating plan to your individual calorie needs. Reading food labels  Check food labels for the amount of sodium per serving. Choose foods with less than 5 percent of the Daily Value of sodium. Generally, foods with less than 300 mg of sodium per serving fit into this eating plan.  To find whole grains, look for the word "whole" as the first word in the ingredient list. Shopping  Buy products labeled as "low-sodium" or "no salt added."  Buy fresh foods. Avoid canned foods and premade or frozen meals. Cooking  Avoid adding salt when cooking. Use salt-free seasonings or herbs instead of table salt or sea salt. Check with your health care provider or pharmacist before using salt substitutes.  Do not fry foods. Cook foods using healthy methods such as baking, boiling, grilling, and broiling instead.  Cook with  heart-healthy oils, such as olive, canola, soybean, or sunflower oil. Meal planning   Eat a balanced diet that includes: ? 5 or more servings of fruits and vegetables each day. At each meal, try to fill half of your plate with fruits and vegetables. ? Up to 6-8 servings of whole grains each day. ? Less than 6 oz of lean meat, poultry, or fish each day. A 3-oz serving of meat is about the same size as a deck of cards. One egg equals 1 oz. ? 2 servings of low-fat dairy each day. ? A serving of nuts, seeds, or beans 5 times each week. ? Heart-healthy fats. Healthy fats called Omega-3 fatty acids are found in foods such as flaxseeds and coldwater fish, like sardines, salmon, and mackerel.  Limit how much you eat of the following: ? Canned or prepackaged foods. ? Food that is high in trans fat, such as fried foods. ? Food that is high in saturated fat, such as fatty meat. ? Sweets, desserts, sugary drinks, and other foods with added sugar. ? Full-fat dairy products.  Do not salt foods before eating.  Try to eat at least 2 vegetarian meals each week.  Eat more home-cooked food and less restaurant, buffet, and fast food.  When eating at a restaurant, ask that your food be prepared with less salt or no salt, if possible. What foods are recommended? The items listed may not be a complete list. Talk with your dietitian about what   dietary choices are best for you. Grains Whole-grain or whole-wheat bread. Whole-grain or whole-wheat pasta. Brown rice. Oatmeal. Quinoa. Bulgur. Whole-grain and low-sodium cereals. Pita bread. Low-fat, low-sodium crackers. Whole-wheat flour tortillas. Vegetables Fresh or frozen vegetables (raw, steamed, roasted, or grilled). Low-sodium or reduced-sodium tomato and vegetable juice. Low-sodium or reduced-sodium tomato sauce and tomato paste. Low-sodium or reduced-sodium canned vegetables. Fruits All fresh, dried, or frozen fruit. Canned fruit in natural juice (without  added sugar). Meat and other protein foods Skinless chicken or turkey. Ground chicken or turkey. Pork with fat trimmed off. Fish and seafood. Egg whites. Dried beans, peas, or lentils. Unsalted nuts, nut butters, and seeds. Unsalted canned beans. Lean cuts of beef with fat trimmed off. Low-sodium, lean deli meat. Dairy Low-fat (1%) or fat-free (skim) milk. Fat-free, low-fat, or reduced-fat cheeses. Nonfat, low-sodium ricotta or cottage cheese. Low-fat or nonfat yogurt. Low-fat, low-sodium cheese. Fats and oils Soft margarine without trans fats. Vegetable oil. Low-fat, reduced-fat, or light mayonnaise and salad dressings (reduced-sodium). Canola, safflower, olive, soybean, and sunflower oils. Avocado. Seasoning and other foods Herbs. Spices. Seasoning mixes without salt. Unsalted popcorn and pretzels. Fat-free sweets. What foods are not recommended? The items listed may not be a complete list. Talk with your dietitian about what dietary choices are best for you. Grains Baked goods made with fat, such as croissants, muffins, or some breads. Dry pasta or rice meal packs. Vegetables Creamed or fried vegetables. Vegetables in a cheese sauce. Regular canned vegetables (not low-sodium or reduced-sodium). Regular canned tomato sauce and paste (not low-sodium or reduced-sodium). Regular tomato and vegetable juice (not low-sodium or reduced-sodium). Pickles. Olives. Fruits Canned fruit in a light or heavy syrup. Fried fruit. Fruit in cream or butter sauce. Meat and other protein foods Fatty cuts of meat. Ribs. Fried meat. Bacon. Sausage. Bologna and other processed lunch meats. Salami. Fatback. Hotdogs. Bratwurst. Salted nuts and seeds. Canned beans with added salt. Canned or smoked fish. Whole eggs or egg yolks. Chicken or turkey with skin. Dairy Whole or 2% milk, cream, and half-and-half. Whole or full-fat cream cheese. Whole-fat or sweetened yogurt. Full-fat cheese. Nondairy creamers. Whipped toppings.  Processed cheese and cheese spreads. Fats and oils Butter. Stick margarine. Lard. Shortening. Ghee. Bacon fat. Tropical oils, such as coconut, palm kernel, or palm oil. Seasoning and other foods Salted popcorn and pretzels. Onion salt, garlic salt, seasoned salt, table salt, and sea salt. Worcestershire sauce. Tartar sauce. Barbecue sauce. Teriyaki sauce. Soy sauce, including reduced-sodium. Steak sauce. Canned and packaged gravies. Fish sauce. Oyster sauce. Cocktail sauce. Horseradish that you find on the shelf. Ketchup. Mustard. Meat flavorings and tenderizers. Bouillon cubes. Hot sauce and Tabasco sauce. Premade or packaged marinades. Premade or packaged taco seasonings. Relishes. Regular salad dressings. Where to find more information:  National Heart, Lung, and Blood Institute: www.nhlbi.nih.gov  American Heart Association: www.heart.org Summary  The DASH eating plan is a healthy eating plan that has been shown to reduce high blood pressure (hypertension). It may also reduce your risk for type 2 diabetes, heart disease, and stroke.  With the DASH eating plan, you should limit salt (sodium) intake to 2,300 mg a day. If you have hypertension, you may need to reduce your sodium intake to 1,500 mg a day.  When on the DASH eating plan, aim to eat more fresh fruits and vegetables, whole grains, lean proteins, low-fat dairy, and heart-healthy fats.  Work with your health care provider or diet and nutrition specialist (dietitian) to adjust your eating plan to your individual   calorie needs. This information is not intended to replace advice given to you by your health care provider. Make sure you discuss any questions you have with your health care provider. Document Released: 05/07/2011 Document Revised: 05/11/2016 Document Reviewed: 05/11/2016 Elsevier Interactive Patient Education  2018 Elsevier Inc.  

## 2018-03-04 ENCOUNTER — Other Ambulatory Visit: Payer: Self-pay

## 2018-03-04 NOTE — Patient Outreach (Signed)
[facility]) Care Management  03/04/2018  Tina [name] Sep 02, 1945 [id number]   Medication Adherence call to Tina [name] left a message for patient to call back patient is due on Rosuvastatin 20 mg and Ramipril 5 mg under [place].  [facility] Direct Dial [phone]  Fax [phone] [email or web address]

## 2018-03-05 ENCOUNTER — Other Ambulatory Visit: Payer: Self-pay | Admitting: Family Medicine

## 2018-03-05 DIAGNOSIS — I10 Essential (primary) hypertension: Secondary | ICD-10-CM

## 2018-03-08 DIAGNOSIS — Z1211 Encounter for screening for malignant neoplasm of colon: Secondary | ICD-10-CM | POA: Diagnosis not present

## 2018-03-14 LAB — COLOGUARD: Cologuard: POSITIVE

## 2018-03-15 ENCOUNTER — Telehealth: Payer: Self-pay | Admitting: *Deleted

## 2018-03-15 ENCOUNTER — Other Ambulatory Visit: Payer: Self-pay | Admitting: Family Medicine

## 2018-03-15 DIAGNOSIS — R195 Other fecal abnormalities: Secondary | ICD-10-CM

## 2018-03-15 NOTE — Telephone Encounter (Signed)
Needs gi referral

## 2018-03-15 NOTE — Telephone Encounter (Signed)
Received Positive Cologuard Results; forwarded to provider/SLS 10/15

## 2018-03-16 NOTE — Telephone Encounter (Signed)
Patient will be out of town for a week.  She does not received vm.  She does mychart.  Tina [name] can you make chart of this in her referral to GI

## 2018-03-16 NOTE — Telephone Encounter (Signed)
Patient notified referral already placed

## 2018-03-17 ENCOUNTER — Telehealth: Payer: Self-pay | Admitting: Family Medicine

## 2018-03-17 NOTE — Telephone Encounter (Signed)
Cologuard results received.

## 2018-03-17 NOTE — Telephone Encounter (Signed)
Copied from [facility] [id number]. Topic: General - Other >> Mar 17, 2018 12:26 PM Margot Ables wrote: Reason for CRM: calling to make sure abnormal cologuard results were received. They were faxed 03/14/18 to [phone]. Please advise.

## 2018-03-25 ENCOUNTER — Encounter: Payer: Self-pay | Admitting: *Deleted

## 2018-03-25 ENCOUNTER — Encounter: Payer: Self-pay | Admitting: Internal Medicine

## 2018-03-25 ENCOUNTER — Telehealth: Payer: Self-pay | Admitting: *Deleted

## 2018-03-25 NOTE — Telephone Encounter (Signed)
Error

## 2018-04-04 ENCOUNTER — Ambulatory Visit (AMBULATORY_SURGERY_CENTER): Payer: Self-pay

## 2018-04-04 VITALS — Ht 65.0 in | Wt 190.0 lb

## 2018-04-04 DIAGNOSIS — Z1211 Encounter for screening for malignant neoplasm of colon: Secondary | ICD-10-CM

## 2018-04-04 MED ORDER — NA SULFATE-K SULFATE-MG SULF 17.5-3.13-1.6 GM/177ML PO SOLN: 1.0000 | Freq: Once | ORAL | 0 refills | Status: AC

## 2018-04-04 NOTE — Progress Notes (Signed)
No egg or soy allergy known to patient  No issues with past sedation with any surgeries  or procedures, no intubation problems  No diet pills per patient No home 02 use per patient  No blood thinners per patient  Pt denies issues with constipation  No A fib or A flutter  EMMI video sent to pt's e mail  

## 2018-04-05 ENCOUNTER — Encounter: Payer: Self-pay | Admitting: Gastroenterology

## 2018-04-14 ENCOUNTER — Encounter: Payer: Medicare Other | Admitting: Internal Medicine

## 2018-04-15 ENCOUNTER — Ambulatory Visit (AMBULATORY_SURGERY_CENTER): Payer: Medicare Other | Admitting: Gastroenterology

## 2018-04-15 ENCOUNTER — Encounter: Payer: Self-pay | Admitting: Gastroenterology

## 2018-04-15 VITALS — BP 131/78 | HR 90 | Temp 98.0°F | Resp 17 | Ht 65.0 in | Wt 190.0 lb

## 2018-04-15 DIAGNOSIS — D128 Benign neoplasm of rectum: Secondary | ICD-10-CM

## 2018-04-15 DIAGNOSIS — R195 Other fecal abnormalities: Secondary | ICD-10-CM

## 2018-04-15 DIAGNOSIS — Z1211 Encounter for screening for malignant neoplasm of colon: Secondary | ICD-10-CM | POA: Diagnosis not present

## 2018-04-15 DIAGNOSIS — K635 Polyp of colon: Secondary | ICD-10-CM

## 2018-04-15 DIAGNOSIS — D123 Benign neoplasm of transverse colon: Secondary | ICD-10-CM | POA: Diagnosis not present

## 2018-04-15 DIAGNOSIS — K6289 Other specified diseases of anus and rectum: Secondary | ICD-10-CM | POA: Diagnosis not present

## 2018-04-15 DIAGNOSIS — D375 Neoplasm of uncertain behavior of rectum: Secondary | ICD-10-CM | POA: Diagnosis not present

## 2018-04-15 MED ORDER — SODIUM CHLORIDE 0.9 % IV SOLN: 500.0000 mL | Freq: Once | INTRAVENOUS | Status: DC

## 2018-04-15 NOTE — Progress Notes (Signed)
Report given to PACU, vss 

## 2018-04-15 NOTE — Op Note (Addendum)
Tina [name] Name: Tina [name] Procedure Date: 04/15/2018 9:24 AM MRN: [id number] Endoscopist: [place]. Tina [name] , MD Age: 72 Referring MD:  Date of Birth: [birth year] Gender: Female Account #: [id number] Procedure:                Colonoscopy Indications:              Positive Cologuard test Medicines:                Monitored Anesthesia Care Procedure:                Pre-Anesthesia Assessment:                           - Prior to the procedure, a History and Physical                            was performed, and [name] medications and                            allergies were reviewed. The [name]'s tolerance of                            previous anesthesia was also reviewed. The risks                            and benefits of the procedure and the sedation                            options and risks were discussed with the [name].                            All questions were answered, and informed consent                            was obtained. Anticoagulants: The [name] has taken                            aspirin. It was decided not to withhold this                            medication prior to the procedure. ASA Grade                            Assessment: II - A [name] with mild systemic                            disease. After reviewing the risks and benefits,                            the [name] was deemed in satisfactory condition to                            undergo the procedure.  After obtaining informed consent, the colonoscope                            was passed under direct vision. Throughout the                            procedure, the [name]'s blood pressure, pulse, and                            oxygen saturations were monitored continuously. The                            Model PCF-[id number]) scope was introduced                            through the anus and advanced to the the cecum,                             identified by appendiceal orifice and ileocecal                            valve. The [name] tolerated the procedure well.                            The colonoscopy was somewhat difficult due to                            multiple diverticula in the colon and a tortuous                            colon. Scope In: 9:38:45 AM Scope Out: 10:13:36 AM Scope Withdrawal Time: 0 hours 29 minutes 27 seconds  Total Procedure Duration: 0 hours 34 minutes 51 seconds  Findings:                 The perianal and digital rectal examinations were                            normal.                           A 5 mm polyp was found in the hepatic flexure. The                            polyp was flat. The polyp was removed with a cold                            snare. Resection and retrieval were complete.                           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 The mass                            was partially circumferential (involving one-half                            of the lumen circumference). The mass measured                            about four cm in length. Mucosa was biopsied with a                            cold forceps for histology. One specimen bottle was                            sent to pathology. Area was tattooed with a 0.66ml                            injection of Spot about 5 cm proximal to the mass                            (a second could not be done due to challenging                            scope position and tortuous recto-sigmoid anatomy).                            Another two 0.5 ml injections of Spot were placed                            about 3cm distal to the mass.                           Multiple small and large-mouthed diverticula were                            found from sigmoid to ascending  colon.                           The exam was otherwise without abnormality on                            direct and retroflexion views. Complications:            No immediate complications. Estimated Blood Loss:     Estimated blood loss was minimal. Impression:               - One 5 mm polyp at the hepatic flexure, removed                            with a cold snare. Resected and retrieved.                           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                            Biopsied. Tattooed.                           - Diverticulosis from sigmoid to ascending colon.                           - The examination was otherwise normal on direct                            and retroflexion views. Recommendation:           - [name] has a contact number available for                            emergencies. The signs and symptoms of potential                            delayed complications were discussed with the                            [name]. Return to normal activities tomorrow.                            Written discharge instructions were provided to the                            [name].                           - Resume previous diet.                           - Continue present medications.                           - Await pathology results, further plan to follow.                           - Repeat colonoscopy is recommended for                            surveillance. The colonoscopy date will be                            determined after pathology results from today's                            exam become available for review. Tina L. Tina Carrow, MD 04/15/2018 10:23:38 AM This report has been signed electronically.

## 2018-04-15 NOTE — Progress Notes (Signed)
Called to room to assist during endoscopic procedure.  Patient ID and intended procedure confirmed with present staff. Received instructions for my participation in the procedure from the performing physician.  

## 2018-04-15 NOTE — Patient Instructions (Addendum)
Thank you for allowing [place] to care for you today!  Await pathology results to determine further treatment.-- Dr [name] will call you next week  With the  results.  Future colonoscopy to be determined at a later date.  Resume previous diet and medications today.  Return to normal activities tomorrow.  Handouts provided for polyps and diverticulosis.      YOU HAD AN ENDOSCOPIC PROCEDURE TODAY AT [facility]:   Refer to the procedure report that was given to you for any specific questions about what was found during the examination.  If the procedure report does not answer your questions, please call your gastroenterologist to clarify.  If you requested that your care partner not be given the details of your procedure findings, then the procedure report has been included in a sealed envelope for you to review at your convenience later.  YOU SHOULD EXPECT: Some feelings of bloating in the abdomen. Passage of more gas than usual.  Walking can help get rid of the air that was put into your GI tract during the procedure and reduce the bloating. If you had a lower endoscopy (such as a colonoscopy or flexible sigmoidoscopy) you may notice spotting of blood in your stool or on the toilet paper. If you underwent a bowel prep for your procedure, you may not have a normal bowel movement for a few days.  Please Note:  You might notice some irritation and congestion in your nose or some drainage.  This is from the oxygen used during your procedure.  There is no need for concern and it should clear up in a day or so.  SYMPTOMS TO REPORT IMMEDIATELY:   Following lower endoscopy (colonoscopy or flexible sigmoidoscopy):  Excessive amounts of blood in the stool  Significant tenderness or worsening of abdominal pains  Swelling of the abdomen that is new, acute  Fever of 100F or higher     For urgent or emergent issues, a gastroenterologist can be reached at any hour by calling [phone]  [phone].   DIET:  We do recommend a small meal at first, but then you may proceed to your regular diet.  Drink plenty of fluids but you should avoid alcoholic beverages for 24 hours.  ACTIVITY:  You should plan to take it easy for the rest of today and you should NOT DRIVE or use heavy machinery until tomorrow (because of the sedation medicines used during the test).    FOLLOW UP: Our staff will call the number listed on your records the next business day following your procedure to check on you and address any questions or concerns that you may have regarding the information given to you following your procedure. If we do not reach you, we will leave a message.  However, if you are feeling well and you are not experiencing any problems, there is no need to return our call.  We will assume that you have returned to your regular daily activities without incident.  If any biopsies were taken you will be contacted by phone or by letter within the next 1-3 weeks.  Please call us at [phone] if you have not heard about the biopsies in 3 weeks.    SIGNATURES/CONFIDENTIALITY: You and/or your care partner have signed paperwork which will be entered into your electronic medical record.  These signatures attest to the fact that that the information above on your After Visit Summary has been reviewed and is understood.  Full responsibility of the  confidentiality of this discharge information lies with you and/or your care-partner. 

## 2018-04-18 ENCOUNTER — Telehealth: Payer: Self-pay | Admitting: *Deleted

## 2018-04-18 NOTE — Telephone Encounter (Signed)
  Follow up Call-  Call back number 04/15/2018  Post procedure Call Back phone  # [phone]  Permission to leave phone message Yes  Some recent data might be hidden     Patient questions:  Do you have a fever, pain , or abdominal swelling? No. Pain Score  0 *  Have you tolerated food without any problems? Yes.    Have you been able to return to your normal activities? Yes.    Do you have any questions about your discharge instructions: Diet   No. Medications  No. Follow up visit  No.  Do you have questions or concerns about your Care? No.  Actions: * If pain score is 4 or above: No action needed, pain <4.

## 2018-04-22 ENCOUNTER — Ambulatory Visit
Admission: RE | Admit: 2018-04-22 | Discharge: 2018-04-22 | Disposition: A | Discharge status: Home / Self Care | Payer: Medicare Other | Source: Ambulatory Visit | Attending: Family Medicine | Admitting: Family Medicine

## 2018-04-22 DIAGNOSIS — M8588 Other specified disorders of bone density and structure, other site: Secondary | ICD-10-CM | POA: Diagnosis not present

## 2018-04-22 DIAGNOSIS — Z780 Asymptomatic menopausal state: Secondary | ICD-10-CM | POA: Diagnosis not present

## 2018-04-22 DIAGNOSIS — E2839 Other primary ovarian failure: Secondary | ICD-10-CM

## 2018-04-22 DIAGNOSIS — Z1231 Encounter for screening mammogram for malignant neoplasm of breast: Secondary | ICD-10-CM | POA: Diagnosis not present

## 2018-04-22 DIAGNOSIS — M810 Age-related osteoporosis without current pathological fracture: Secondary | ICD-10-CM | POA: Diagnosis not present

## 2018-06-06 ENCOUNTER — Other Ambulatory Visit: Payer: Self-pay | Admitting: Family Medicine

## 2018-06-06 ENCOUNTER — Other Ambulatory Visit: Payer: Self-pay

## 2018-06-06 DIAGNOSIS — I10 Essential (primary) hypertension: Secondary | ICD-10-CM

## 2018-06-06 NOTE — Patient Outreach (Signed)
[facility]) Care Management  06/06/2018  Tina [name] [birth year] [id number]   Medication Adherence call to Tina [name] left a message for patient to call back patient is due on Ramipril 5 mg and Rosuvastatin 20 mg. Tina [name] is showing past due under [place].   [facility] Management Direct Dial [phone]  Fax [phone] [email or web address]

## 2018-07-14 ENCOUNTER — Other Ambulatory Visit: Payer: Self-pay | Admitting: Family Medicine

## 2018-08-29 ENCOUNTER — Encounter: Payer: Medicare Other | Admitting: Family Medicine

## 2018-10-31 ENCOUNTER — Encounter: Payer: Medicare Other | Admitting: Family Medicine

## 2018-11-03 ENCOUNTER — Other Ambulatory Visit: Payer: Self-pay | Admitting: Family Medicine

## 2018-11-30 ENCOUNTER — Other Ambulatory Visit: Payer: Self-pay | Admitting: Family Medicine

## 2018-11-30 DIAGNOSIS — I10 Essential (primary) hypertension: Secondary | ICD-10-CM

## 2019-02-08 ENCOUNTER — Encounter: Payer: Medicare Other | Admitting: Family Medicine

## 2019-02-20 ENCOUNTER — Telehealth: Payer: Self-pay | Admitting: Family Medicine

## 2019-02-20 MED ORDER — ROSUVASTATIN CALCIUM 20 MG PO TABS: ORAL_TABLET | ORAL | 0 refills | Status: DC

## 2019-02-20 NOTE — Telephone Encounter (Signed)
rx sent in and patient notified. 

## 2019-02-20 NOTE — Telephone Encounter (Signed)
Medication: rosuvastatin (CRESTOR) 20 MG tablet    Patient is requesting a refill of this medication. Patient is currently out of this medication.    Pharmacy:  [facility] [place], [address] AT [facility] [address] [phone] (Phone) [phone] (Fax)

## 2019-02-21 ENCOUNTER — Other Ambulatory Visit: Payer: Self-pay

## 2019-02-21 NOTE — Patient Outreach (Signed)
[facility]) Care Management  02/21/2019  Tina [name] [birth year] [id number]   Medication Adherence call to Tina [name] Hipp[facility] Identifiers Verify spoke with patient she is past due on Rosuvastatin 20 mg patient explain she is taking 1 tablet daily and has already pick up from the pharmacy she was waiting for doctor to approve her refill,patient said there are days she forgets to take it. Tina [name] is showing past due under [name].   [place] Management Direct Dial [phone]  Fax [phone] [name]

## 2019-02-24 NOTE — Progress Notes (Signed)
Virtual Visit via Video Note  I connected with patient on 02/27/19  at  8:00 AM EDT by audio enabled telemedicine application and verified that I am speaking with the correct person using two identifiers.  THIS ENCOUNTER IS A VIRTUAL VISIT DUE TO COVID-19 - PATIENT WAS NOT SEEN IN THE OFFICE. PATIENT HAS CONSENTED TO VIRTUAL VISIT / TELEMEDICINE VISIT   Location of patient: home  Location of provider: office  I discussed the limitations of evaluation and management by telemedicine and the availability of in person appointments. The patient expressed understanding and agreed to proceed.   Subjective:   Tina [name] is a 73 y.o. female who presents for Medicare Annual (Subsequent) preventive examination.  Review of Systems:  Home Safety/Smoke Alarms: Feels safe in home. Smoke alarms in place.  Lives alone (w/2 cats) in 1 story home. Still mows her own yard.    Female:       Mammo- 04/22/18      Dexa scan-  04/22/18 [facility]- 04/15/18    Objective:     Vitals: Unable to assess. This visit is enabled though telemedicine due to Covid 19.   Advanced Directives 02/27/2019 02/24/2018 02/23/2017 10/11/2015  Does Patient Have a Medical Advance Directive? No No No No  Would patient like information on creating a medical advance directive? No - Patient declined Yes (MAU/Ambulatory/Procedural Areas - Information given) Yes (MAU/Ambulatory/Procedural Areas - Information given) No - patient declined information    Tobacco Social History   Tobacco Use  Smoking Status Former Smoker  . Packs/day: 1.00  . Years: 30.00  . Pack years: 30.00  . Quit date: 12/13/2002  . Years since quitting: 16.2  Smokeless Tobacco Former Systems developer  . Quit date: 06/01/2002     Counseling given: Not Answered   Clinical Intake: Pain : No/denies pain     Past Medical History:  Diagnosis Date  . Cataract   . Fatty liver   . Goiter   . HTN (hypertension)   . Hyperlipidemia   . Myocardial infarct ([place]) hx of  2004  . Osteoporosis   . Sleep apnea    no cpap  . Wrist fracture    Past Surgical History:  Procedure Laterality Date  . CARDIAC CATHETERIZATION  2004   stent  . sleep study  2012   Family History  Problem Relation Age of Onset  . Hypertension Mother   . Heart disease Mother 81       MI--- died from #3 at 89  . Ovarian cancer Mother   . Cancer Mother   . Hypertension Father   . Cancer Father 65       ovarian  . Heart disease Father   . Hypertension Sister   . Cancer Sister 52       brain tumor  . Coronary artery disease Other   . Lung cancer Other   . Ovarian cancer Other   . Diabetes Maternal Grandmother   . Hypertension Brother   . Arthritis Brother   . Colon cancer Neg Hx   . Colon polyps Neg Hx   . Esophageal cancer Neg Hx   . Stomach cancer Neg Hx   . Rectal cancer Neg Hx    Social History   Socioeconomic History  . Marital status: Divorced    Spouse name: Not on file  . Number of children: Not on file  . Years of education: Not on file  . Highest education level: Not on file  Occupational History  .  Occupation: Optician, dispensing: OTHER    Comment: [employer]   Social Needs  . Financial resource strain: Not on file  . Food insecurity    Worry: Not on file    Inability: Not on file  . Transportation needs    Medical: Not on file    Non-medical: Not on file  Tobacco Use  . Smoking status: Former Smoker    Packs/day: 1.00    Years: 30.00    Pack years: 30.00    Quit date: 12/13/2002    Years since quitting: 16.2  . Smokeless tobacco: Former Systems developer    Quit date: 06/01/2002  Substance and Sexual Activity  . Alcohol use: Yes    Comment: 1-2 glasses of wine with dinner  . Drug use: No  . Sexual activity: Not Currently    Partners: Male  Lifestyle  . Physical activity    Days per week: Not on file    Minutes per session: Not on file  . Stress: Not on file  Relationships  . Social Herbalist on phone: Not on file    Gets together:  Not on file    Attends religious service: Not on file    Active member of club or organization: Not on file    Attends meetings of clubs or organizations: Not on file    Relationship status: Not on file  Other Topics Concern  . Not on file  Social History Narrative   Occupation:[employer]   Divorced   Former Smoker quit 2004   Drug use-no   Regular exercise-treadmill 3x a week    Outpatient Encounter Medications as of 02/27/2019  Medication Sig  . alendronate (FOSAMAX) 70 MG tablet TAKE ONE TABLET BY MOUTH ONCE WEEKLY ON EMPTY STOMACH WITH FULL GLASS OF WATER  . aspirin 81 MG tablet Take 81 mg by mouth daily.    . Calcium Carbonate-Vit D-Min (CALCIUM 1200 PO) Take 1,200 mg by mouth daily. 1 tab po qd  . Fish Oil OIL 2 tabs po qd   . Multiple Vitamin (MULTIVITAMIN) capsule Take 1 capsule by mouth daily.    . ramipril (ALTACE) 5 MG capsule TAKE 1 CAPSULE(5 MG) BY MOUTH DAILY  . rosuvastatin (CRESTOR) 20 MG tablet TAKE 1/2 TABLET(10 MG) BY MOUTH DAILY  . [DISCONTINUED] Collagenase POWD by Does not apply route.  . [DISCONTINUED] KRILL OIL PO Take 1 capsule by mouth daily.   No facility-administered encounter medications on file as of 02/27/2019.     Activities of Daily Living In your present state of health, do you have any difficulty performing the following activities: 02/27/2019  Hearing? N  Vision? N  Difficulty concentrating or making decisions? N  Walking or climbing stairs? N  Dressing or bathing? N  Doing errands, shopping? N  Preparing Food and eating ? N  Using the Toilet? N  In the past six months, have you accidently leaked urine? N  Do you have problems with loss of bowel control? N  Managing your Medications? N  Managing your Finances? N  Housekeeping or managing your Housekeeping? N  Some recent data might be hidden    Patient Care Team: Carollee Herter, Alferd Apa, DO as PCP - [place], DDS as Consulting Physician (Dentistry) Jerline Pain Mingo Amber, DO as Consulting Physician (Optometry) Fay Records, MD as Consulting Physician (Cardiology)    Assessment:   This is a routine wellness examination for Tina [name]. Physical assessment deferred to PCP.  Exercise Activities and Dietary recommendations Current Exercise Habits: Home exercise routine, Type of exercise: treadmill, Time (Minutes): 30, Frequency (Times/Week): 3, Weekly Exercise (Minutes/Week): 90, Intensity: Mild, Exercise limited by: None identified   Diet (meal preparation, eat out, water intake, caffeinated beverages, dairy products, fruits and vegetables): 24 hr recall Breakfast:toast w/ almond butter Lunch: sandwich Dinner: crackers w/ cheese   Goals    . Continue healthy diet    . Increase physical activity       Fall Risk Fall Risk  02/27/2019 02/24/2018 02/23/2017 10/11/2015 06/05/2014  Falls in the past year? 0 No No Yes No  Number falls in past yr: 0 - - 1 -  Injury with Fall? 0 - - Yes -  Risk for fall due to : - - - - -  Risk for fall due to: Comment - - - - -     Depression Screen PHQ 2/9 Scores 02/27/2019 02/24/2018 02/23/2017 04/13/2016  PHQ - 2 Score 0 0 0 0     Cognitive Function Ad8 score reviewed for issues:  Issues making decisions:no  Less interest in hobbies / activities:no  Repeats questions, stories (family complaining):no  Trouble using ordinary gadgets (microwave, computer, phone):no  Forgets the month or year: no  Mismanaging finances: no  Remembering appts:no  Daily problems with thinking and/or memory:no Ad8 score is=0         Immunization History  Administered Date(s) Administered  . Influenza, High Dose Seasonal PF 02/25/2015, 04/13/2016, 02/24/2017, 02/28/2018  . Influenza,inj,Quad PF,6+ Mos 06/05/2014  . Influenza-Unspecified 03/30/2017  . Pneumococcal Conjugate-13 02/25/2015  . Pneumococcal Polysaccharide-23 01/03/2014  . Tdap 10/14/2010  . Zoster 10/14/2010    Screening Tests Health Maintenance  Topic Date Due   . INFLUENZA VACCINE  12/31/2018  . MAMMOGRAM  04/23/2019  . DEXA SCAN  04/22/2020  . TETANUS/TDAP  10/13/2020  . Fecal DNA (Cologuard)  03/14/2021  . Hepatitis C Screening  Completed  . PNA vac Low Risk Adult  Completed      Plan:    Please schedule your next medicare wellness visit with me in 1 yr.  Continue to eat heart healthy diet (full of fruits, vegetables, whole grains, lean protein, water--limit salt, fat, and sugar intake) and increase physical activity as tolerated.  Continue doing brain stimulating activities (puzzles, reading, adult coloring books, staying active) to keep memory sharp.    I have personally reviewed and noted the following in the patient's chart:   . Medical and social history . Use of alcohol, tobacco or illicit drugs  . Current medications and supplements . Functional ability and status . Nutritional status . Physical activity . Advanced directives . List of other physicians . Hospitalizations, surgeries, and ER visits in previous 12 months . Vitals . Screenings to include cognitive, depression, and falls . Referrals and appointments  In addition, I have reviewed and discussed with patient certain preventive protocols, quality metrics, and best practice recommendations. A written personalized care plan for preventive services as well as general preventive health recommendations were provided to patient.     Naaman Plummer [place], [place]  02/27/2019

## 2019-02-27 ENCOUNTER — Other Ambulatory Visit: Payer: Self-pay

## 2019-02-27 ENCOUNTER — Ambulatory Visit (INDEPENDENT_AMBULATORY_CARE_PROVIDER_SITE_OTHER): Payer: Medicare Other | Admitting: *Deleted

## 2019-02-27 ENCOUNTER — Encounter: Payer: Self-pay | Admitting: *Deleted

## 2019-02-27 DIAGNOSIS — Z0000 Encounter for general adult medical examination without abnormal findings: Secondary | ICD-10-CM

## 2019-02-27 NOTE — Patient Instructions (Signed)
Please schedule your next medicare wellness visit with me in 1 yr.  Continue to eat heart healthy diet (full of fruits, vegetables, whole grains, lean protein, water--limit salt, fat, and sugar intake) and increase physical activity as tolerated.  Continue doing brain stimulating activities (puzzles, reading, adult coloring books, staying active) to keep memory sharp.    Tina [name] , Thank you for taking time to come for your Medicare Wellness Visit. I appreciate your ongoing commitment to your health goals. Please review the following plan we discussed and let me know if I can assist you in the future.   These are the goals we discussed: Goals    . Continue healthy diet    . Increase physical activity       This is a list of the screening recommended for you and due dates:  Health Maintenance  Topic Date Due  . Flu Shot  12/31/2018  . Mammogram  04/23/2019  . DEXA scan (bone density measurement)  04/22/2020  . Tetanus Vaccine  10/13/2020  . Cologuard (Stool DNA test)  03/14/2021  .  Hepatitis C: One time screening is recommended by Center for Disease Control  (CDC) for  adults born from [birth year] through 1964.   Completed  . Pneumonia vaccines  Completed    Health Maintenance After Age 80 After age 10, you are at a higher risk for certain long-term diseases and infections as well as injuries from falls. Falls are a major cause of broken bones and head injuries in people who are older than age 54. Getting regular preventive care can help to keep you healthy and well. Preventive care includes getting regular testing and making lifestyle changes as recommended by your health care provider. Talk with your health care provider about:  Which screenings and tests you should have. A screening is a test that checks for a disease when you have no symptoms.  A diet and exercise plan that is right for you. What should I know about screenings and tests to prevent falls? Screening and testing are  the best ways to find a health problem early. Early diagnosis and treatment give you the best chance of managing medical conditions that are common after age 49. Certain conditions and lifestyle choices may make you more likely to have a fall. Your health care provider may recommend:  Regular vision checks. Poor vision and conditions such as cataracts can make you more likely to have a fall. If you wear glasses, make sure to get your prescription updated if your vision changes.  Medicine review. Work with your health care provider to regularly review all of the medicines you are taking, including over-the-counter medicines. Ask your health care provider about any side effects that may make you more likely to have a fall. Tell your health care provider if any medicines that you take make you feel dizzy or sleepy.  Osteoporosis screening. Osteoporosis is a condition that causes the bones to get weaker. This can make the bones weak and cause them to break more easily.  Blood pressure screening. Blood pressure changes and medicines to control blood pressure can make you feel dizzy.  Strength and balance checks. Your health care provider may recommend certain tests to check your strength and balance while standing, walking, or changing positions.  Foot health exam. Foot pain and numbness, as well as not wearing proper footwear, can make you more likely to have a fall.  Depression screening. You may be more likely to have a fall  if you have a fear of falling, feel emotionally low, or feel unable to do activities that you used to do.  Alcohol use screening. Using too much alcohol can affect your balance and may make you more likely to have a fall. What actions can I take to lower my risk of falls? General instructions  Talk with your health care provider about your risks for falling. Tell your health care provider if: ? You fall. Be sure to tell your health care provider about all falls, even ones that  seem minor. ? You feel dizzy, sleepy, or off-balance.  Take over-the-counter and prescription medicines only as told by your health care provider. These include any supplements.  Eat a healthy diet and maintain a healthy weight. A healthy diet includes low-fat dairy products, low-fat (lean) meats, and fiber from whole grains, beans, and lots of fruits and vegetables. Home safety  Remove any tripping hazards, such as rugs, cords, and clutter.  Install safety equipment such as grab bars in bathrooms and safety rails on stairs.  Keep rooms and walkways well-lit. Activity   Follow a regular exercise program to stay fit. This will help you maintain your balance. Ask your health care provider what types of exercise are appropriate for you.  If you need a cane or walker, use it as recommended by your health care provider.  Wear supportive shoes that have nonskid soles. Lifestyle  Do not drink alcohol if your health care provider tells you not to drink.  If you drink alcohol, limit how much you have: ? 0-1 drink a day for women. ? 0-2 drinks a day for men.  Be aware of how much alcohol is in your drink. In the [place] one drink equals one typical bottle of beer (12 oz), one-half glass of wine (5 oz), or one shot of hard liquor (1 oz).  Do not use any products that contain nicotine or tobacco, such as cigarettes and e-cigarettes. If you need help quitting, ask your health care provider. Summary  Having a healthy lifestyle and getting preventive care can help to protect your health and wellness after age 16.  Screening and testing are the best way to find a health problem early and help you avoid having a fall. Early diagnosis and treatment give you the best chance for managing medical conditions that are more common for people who are older than age 45.  Falls are a major cause of broken bones and head injuries in people who are older than age 36. Take precautions to prevent a fall at  home.  Work with your health care provider to learn what changes you can make to improve your health and wellness and to prevent falls. This information is not intended to replace advice given to you by your health care provider. Make sure you discuss any questions you have with your health care provider. Document Released: 03/31/2017 Document Revised: 09/08/2018 Document Reviewed: 03/31/2017 Elsevier Patient Education  2020 [employer].

## 2019-03-13 ENCOUNTER — Telehealth: Payer: Self-pay | Admitting: Gastroenterology

## 2019-03-13 NOTE — Telephone Encounter (Signed)
Spoke to the patient who reports at the time she was told she would be referred "after the holidays." She reports she never received a call from [facility] (assuming this is where she was referred) or any other surgery center to be scheduled for a consultation. She reports then COVID-19 struck and she assumed "everything had been put on hold" due to the pandemic. I don't see any documentation from nursing/clinical staff saying the patient was referred to [facility] or another place. How would you like to proceed? Do you want me to fax a referral to [facility] or do you want the patient to complete another colon prior to the referral being sent? Please advise.

## 2019-03-13 NOTE — Telephone Encounter (Signed)
Called the patient, left message for the patient to call back.   Called patients daughter, Yvette Rack (listed on the DPR), left message to call back.

## 2019-03-13 NOTE — Telephone Encounter (Signed)
Pt returned your call.  

## 2019-03-13 NOTE — Telephone Encounter (Signed)
There is a result note on the 04/2018 pathology report documenting my call to patient with result and need for surgical referral.  Clinic nursing (my previous nurse) noted referral sent to [facility].  I do not know why patient not contacted by surgical practice or why patient did not call when she did not hear from them.  She needs to have a colonoscopy with me by the end of November.  We will need to wait on the new surgical referral until after that large rectal polyp is re-evaluated.

## 2019-03-13 NOTE — Telephone Encounter (Signed)
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  She was referred to surgery last year, but chart review does not find any surgical reports.  It is not clear to me that she ever saw surgery as recommended.  Please call her and let me know what you discover.

## 2019-03-14 ENCOUNTER — Other Ambulatory Visit: Payer: Self-pay | Admitting: *Deleted

## 2019-03-14 ENCOUNTER — Encounter: Payer: Self-pay | Admitting: *Deleted

## 2019-03-14 DIAGNOSIS — Z1159 Encounter for screening for other viral diseases: Secondary | ICD-10-CM

## 2019-03-14 NOTE — Telephone Encounter (Signed)
Spoke with patient, scheduled for [place] screening on 11/6 at 11:00 am.

## 2019-03-14 NOTE — Telephone Encounter (Signed)
Spoke to the patient, scheduled the following:   10/23 at 11:00 am pre-op visit with nurse  11/6 (unable to scheduled COVID screening, schedule not open, Barb Merino aware)  11/9 at 1:30 pm colon is [facility]  When the scheduled opens for COVID screening, the patient will be contacted and scheduled. Patient aware that she will be screened on 11/6 and is agreeable with this plan.

## 2019-03-24 ENCOUNTER — Ambulatory Visit (AMBULATORY_SURGERY_CENTER): Payer: Self-pay

## 2019-03-24 ENCOUNTER — Other Ambulatory Visit: Payer: Self-pay

## 2019-03-24 ENCOUNTER — Other Ambulatory Visit: Payer: Self-pay | Admitting: Family Medicine

## 2019-03-24 VITALS — Temp 96.6°F | Ht 65.0 in | Wt 196.0 lb

## 2019-03-24 DIAGNOSIS — Z1231 Encounter for screening mammogram for malignant neoplasm of breast: Secondary | ICD-10-CM

## 2019-03-24 DIAGNOSIS — Z86010 Personal history of colonic polyps: Secondary | ICD-10-CM

## 2019-03-24 MED ORDER — NA SULFATE-K SULFATE-MG SULF 17.5-3.13-1.6 GM/177ML PO SOLN: 1.0000 | Freq: Once | ORAL | 0 refills | Status: AC

## 2019-03-24 NOTE — Progress Notes (Signed)
Denies allergies to eggs or soy products. Denies complication of anesthesia or sedation. Denies use of weight loss medication. Denies use of O2.   Emmi instructions given for colonoscopy.  Covid screening was scheduled prior to Pre-Visit on 04-07-19 @ 11:00 Am.

## 2019-03-27 ENCOUNTER — Encounter: Payer: Self-pay | Admitting: Gastroenterology

## 2019-03-28 ENCOUNTER — Telehealth: Payer: Self-pay | Admitting: Gastroenterology

## 2019-03-28 NOTE — Telephone Encounter (Signed)
Dr. [name],   I called the patient to relay your advice. Patient states that if the colonoscopy was coded as Diagnostic her insurance would pay for it otherwise she was told that the procedure will be about 4,000. Patient is concerned about the cost. I told her that it was very important that she follow up and have the colonoscopy.  I am not convinced that she understands how important it is and that there could be changes over a year. I told her that I wasn't sure what the code would be. Patient states if it is coded as hx of polyps insurance wasn't going to pay. I am not sure what she is going to do regardless of what you and I have suggested. Thank you for your previous prompt response.   Riki Sheer, LPN ( [facility] )

## 2019-03-28 NOTE — Telephone Encounter (Signed)
Yes, the colonoscopy is necessary because it has been a year since the last colonoscopy, and I need to re-evaluate the large polyp and make sure it has not turned cancerous before getting a surgical consultation.

## 2019-03-28 NOTE — Telephone Encounter (Signed)
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  I do not know if her insurance will consider that" diagnostic".  That said, I cannot overstate the importance of her having a colonoscopy in order to determine if the mass has become malignant and therefore if any further testing is necessary prior to surgical evaluation.  If she has someone such as a child who helps her navigate medical decisions, please offer to convey this information to them if Tina [name] would like.

## 2019-03-28 NOTE — Telephone Encounter (Signed)
Dr. [name], I spoke to Tina [name] for the second time today and relayed your latest information. I finally convinced her of the importance of proceeding with the colonoscopy as planned. She did agree! She will keep her colonoscopy appointment as scheduled. She did appreciate your feed-back!  Thanks!   Riki Sheer, LPN ( [facility] )

## 2019-03-28 NOTE — Telephone Encounter (Signed)
Pt is scheduled for colon 04/10/19.  She stated that after her previous colon 04/2018, she was supposed to schedule for a surgery consultation but was never contacted by surgeon's office.    She would like to know whether colon is necessary if she will need another surgery consultation.

## 2019-04-03 DIAGNOSIS — H02831 Dermatochalasis of right upper eyelid: Secondary | ICD-10-CM | POA: Diagnosis not present

## 2019-04-03 DIAGNOSIS — H2513 Age-related nuclear cataract, bilateral: Secondary | ICD-10-CM | POA: Diagnosis not present

## 2019-04-03 DIAGNOSIS — H40023 Open angle with borderline findings, high risk, bilateral: Secondary | ICD-10-CM | POA: Diagnosis not present

## 2019-04-03 DIAGNOSIS — H02834 Dermatochalasis of left upper eyelid: Secondary | ICD-10-CM | POA: Diagnosis not present

## 2019-04-06 ENCOUNTER — Other Ambulatory Visit: Payer: Self-pay

## 2019-04-06 ENCOUNTER — Ambulatory Visit (INDEPENDENT_AMBULATORY_CARE_PROVIDER_SITE_OTHER): Payer: Medicare Other | Admitting: Family Medicine

## 2019-04-06 ENCOUNTER — Encounter: Payer: Self-pay | Admitting: Family Medicine

## 2019-04-06 VITALS — BP 130/76 | HR 83 | Temp 97.5°F | Resp 18 | Ht 65.0 in | Wt 195.4 lb

## 2019-04-06 DIAGNOSIS — I10 Essential (primary) hypertension: Secondary | ICD-10-CM | POA: Diagnosis not present

## 2019-04-06 DIAGNOSIS — I252 Old myocardial infarction: Secondary | ICD-10-CM

## 2019-04-06 DIAGNOSIS — Z0000 Encounter for general adult medical examination without abnormal findings: Secondary | ICD-10-CM

## 2019-04-06 DIAGNOSIS — E049 Nontoxic goiter, unspecified: Secondary | ICD-10-CM | POA: Diagnosis not present

## 2019-04-06 DIAGNOSIS — M810 Age-related osteoporosis without current pathological fracture: Secondary | ICD-10-CM

## 2019-04-06 DIAGNOSIS — E785 Hyperlipidemia, unspecified: Secondary | ICD-10-CM | POA: Diagnosis not present

## 2019-04-06 NOTE — Progress Notes (Signed)
Subjective:     Tina [name] is a 73 y.o. female and is here for a comprehensive physical exam. The patient reports problems - pt bp was high in the dentist office but she had not taken her meds'. She also needs med refills  Social History   Socioeconomic History  . Marital status: Divorced    Spouse name: Not on file  . Number of children: Not on file  . Years of education: Not on file  . Highest education level: Not on file  Occupational History  . Occupation: Optician, dispensing: OTHER    Comment: [employer]   Social Needs  . Financial resource strain: Not on file  . Food insecurity    Worry: Not on file    Inability: Not on file  . Transportation needs    Medical: Not on file    Non-medical: Not on file  Tobacco Use  . Smoking status: Former Smoker    Packs/day: 1.00    Years: 30.00    Pack years: 30.00    Quit date: 12/13/2002    Years since quitting: 16.3  . Smokeless tobacco: Former Systems developer    Quit date: 06/01/2002  Substance and Sexual Activity  . Alcohol use: Yes    Comment: 1-2 glasses of wine with dinner  . Drug use: No  . Sexual activity: Not Currently    Partners: Male  Lifestyle  . Physical activity    Days per week: Not on file    Minutes per session: Not on file  . Stress: Not on file  Relationships  . Social Herbalist on phone: Not on file    Gets together: Not on file    Attends religious service: Not on file    Active member of club or organization: Not on file    Attends meetings of clubs or organizations: Not on file    Relationship status: Not on file  . Intimate partner violence    Fear of current or ex partner: Not on file    Emotionally abused: Not on file    Physically abused: Not on file    Forced sexual activity: Not on file  Other Topics Concern  . Not on file  Social History Narrative   Occupation:[employer]   Divorced   Former Smoker quit 2004   Drug use-no   Regular exercise-treadmill 3x a week   Health  Maintenance  Topic Date Due  . MAMMOGRAM  04/23/2019  . DEXA SCAN  04/22/2020  . TETANUS/TDAP  10/13/2020  . Fecal DNA (Cologuard)  03/14/2021  . INFLUENZA VACCINE  Completed  . Hepatitis C Screening  Completed  . PNA vac Low Risk Adult  Completed    The following portions of the patient's history were reviewed and updated as appropriate:  She  has a past medical history of Allergy, Cataract, Fatty liver, Goiter, HTN (hypertension), Hyperlipidemia, Myocardial infarct ([place]) (hx of 2004), Osteoporosis, Sleep apnea, and Wrist fracture. She does not have any pertinent problems on file. She  has a past surgical history that includes Cardiac catheterization (2004) and sleep study (2012). Her family history includes Arthritis in her brother; Cancer (age of onset: 86) in her sister; Cancer (age of onset: 66) in her father; Coronary artery disease in an other family member; Diabetes in her maternal grandmother; Heart disease in her father; Heart disease (age of onset: 60) in her mother; Hypertension in her brother, father, mother, and sister; Lung cancer  in an other family member; Ovarian cancer in her mother and another family member. She  reports that she quit smoking about 16 years ago. She has a 30.00 pack-year smoking history. She quit smokeless tobacco use about 16 years ago. She reports current alcohol use. She reports that she does not use drugs. She has a current medication list which includes the following prescription(s): alendronate, aspirin, calcium carbonate-vit d-min, fish oil, ibuprofen, multivitamin, ramipril, and rosuvastatin. Current Outpatient Medications on File Prior to Visit  Medication Sig Dispense Refill  . alendronate (FOSAMAX) 70 MG tablet TAKE ONE TABLET BY MOUTH ONCE WEEKLY ON EMPTY STOMACH WITH FULL GLASS OF WATER 12 tablet 1  . aspirin 81 MG tablet Take 81 mg by mouth daily.      . Calcium Carbonate-Vit D-Min (CALCIUM 1200 PO) Take 1,200 mg by mouth daily. 1 tab po qd     . Fish Oil OIL 2 tabs po qd     . ibuprofen (ADVIL) 100 MG/5ML suspension Take 200 mg by mouth every 4 (four) hours as needed.    . Multiple Vitamin (MULTIVITAMIN) capsule Take 1 capsule by mouth daily.      . ramipril (ALTACE) 5 MG capsule TAKE 1 CAPSULE(5 MG) BY MOUTH DAILY 90 capsule 1  . rosuvastatin (CRESTOR) 20 MG tablet TAKE 1/2 TABLET(10 MG) BY MOUTH DAILY 45 tablet 0   No current facility-administered medications on file prior to visit.    She has No Known Allergies..  Review of Systems Review of Systems  Constitutional: Negative for activity change, appetite change and fatigue.  HENT: Negative for hearing loss, congestion, tinnitus and ear discharge.  dentist q77m Eyes: Negative for visual disturbance (see optho q1y -- vision corrected to 20/20 with glasses).  Respiratory: Negative for cough, chest tightness and shortness of breath.   Cardiovascular: Negative for chest pain, palpitations and leg swelling.  Gastrointestinal: Negative for abdominal pain, diarrhea, constipation and abdominal distention.  Genitourinary: Negative for urgency, frequency, decreased urine volume and difficulty urinating.  Musculoskeletal: Negative for back pain, arthralgias and gait problem.  Skin: Negative for color change, pallor and rash.  Neurological: Negative for dizziness, light-headedness, numbness and headaches.  Hematological: Negative for adenopathy. Does not bruise/bleed easily.  Psychiatric/Behavioral: Negative for suicidal ideas, confusion, sleep disturbance, self-injury, dysphoric mood, decreased concentration and agitation.    '  Objective:    BP 130/76 (BP Location: Right Arm, Patient Position: Sitting, Cuff Size: Large)   Pulse 83   Temp (!) 97.5 F (36.4 C) (Temporal)   Resp 18   Ht 5\' 5"  (1.651 m)   Wt 195 lb 6.4 oz (88.6 kg)   SpO2 96%   BMI 32.52 kg/m  General appearance: alert, cooperative, appears stated age and no distress Head: Normocephalic, without obvious  abnormality, atraumatic Eyes: conjunctivae/corneas clear. PERRL, EOM's intact. Fundi benign. Ears: normal TM's and external ear canals both ears Nose: Nares normal. Septum midline. Mucosa normal. No drainage or sinus tenderness. Throat: lips, mucosa, and tongue normal; teeth and gums normal Neck: no adenopathy, no carotid bruit, no JVD, supple, symmetrical, trachea midline and thyroid not enlarged, symmetric, no tenderness/mass/nodules Back: symmetric, no curvature. ROM normal. No CVA tenderness. Lungs: clear to auscultation bilaterally Breasts: normal appearance, no masses or tenderness Heart: regular rate and rhythm, S1, S2 normal, no murmur, click, rub or gallop Abdomen: soft, non-tender; bowel sounds normal; no masses,  no organomegaly Pelvic: not indicated; post-menopausal, no abnormal Pap smears in past Extremities: extremities normal, atraumatic, no cyanosis or edema  Pulses: 2+ and symmetric Skin: Skin color, texture, turgor normal. No rashes or lesions Lymph nodes: Cervical, supraclavicular, and axillary nodes normal. Neurologic: Alert and oriented X 3, normal strength and tone. Normal symmetric reflexes. Normal coordination and gait    Assessment:    Healthy female exam.      Plan:     ghm utd--- she will think about shingrix Check labs See After Visit Summary for Counseling Recommendations    1. Essential hypertension Well controlled, no changes to meds. Encouraged heart healthy diet such as the DASH diet and exercise as tolerated.  - Lipid panel - Comprehensive metabolic panel  2. Hyperlipidemia, unspecified hyperlipidemia type Tolerating statin, encouraged heart healthy diet, avoid trans fats, minimize simple carbs and saturated fats. Increase exercise as tolerated - Lipid panel - Comprehensive metabolic panel  3. Goiter Stable Check labs  - TSH  4. Hyperlipidemia LDL goal <100 Encouraged heart healthy diet, increase exercise, avoid trans fats, consider a krill  oil cap daily  5. Age-related osteoporosis without current pathological fracture con't ca and vita d  Pt on fosamax now but we will look into evenity or prolia for her  6. Preventative health care See above   7. MYOCARDIAL INFARCTION, HX OF No cp  No new symptoms  con't meds

## 2019-04-06 NOTE — Assessment & Plan Note (Signed)
Pt is on fosamax now We will look into evenity or prolia for her

## 2019-04-06 NOTE — Assessment & Plan Note (Signed)
Check labs  No change

## 2019-04-06 NOTE — Patient Instructions (Signed)
Preventive Care 72 Years and Older, Female Preventive care refers to lifestyle choices and visits with your health care provider that can promote health and wellness. This includes:  A yearly physical exam. This is also called an annual well check.  Regular dental and eye exams.  Immunizations.  Screening for certain conditions.  Healthy lifestyle choices, such as diet and exercise. What can I expect for my preventive care visit? Physical exam Your health care provider will check:  Height and weight. These may be used to calculate body mass index (BMI), which is a measurement that tells if you are at a healthy weight.  Heart rate and blood pressure.  Your skin for abnormal spots. Counseling Your health care provider may ask you questions about:  Alcohol, tobacco, and drug use.  Emotional well-being.  Home and relationship well-being.  Sexual activity.  Eating habits.  History of falls.  Memory and ability to understand (cognition).  Work and work Statistician.  Pregnancy and menstrual history. What immunizations do I need?  Influenza (flu) vaccine  This is recommended every year. Tetanus, diphtheria, and pertussis (Tdap) vaccine  You may need a Td booster every 10 years. Varicella (chickenpox) vaccine  You may need this vaccine if you have not already been vaccinated. Zoster (shingles) vaccine  You may need this after age 72. Pneumococcal conjugate (PCV13) vaccine  One dose is recommended after age 72. Pneumococcal polysaccharide (PPSV23) vaccine  One dose is recommended after age 72. Measles, mumps, and rubella (MMR) vaccine  You may need at least one dose of MMR if you were born in 1957 or later. You may also need a second dose. Meningococcal conjugate (MenACWY) vaccine  You may need this if you have certain conditions. Hepatitis A vaccine  You may need this if you have certain conditions or if you travel or work in places where you may be exposed  to hepatitis A. Hepatitis B vaccine  You may need this if you have certain conditions or if you travel or work in places where you may be exposed to hepatitis B. Haemophilus influenzae type b (Hib) vaccine  You may need this if you have certain conditions. You may receive vaccines as individual doses or as more than one vaccine together in one shot (combination vaccines). Talk with your health care provider about the risks and benefits of combination vaccines. What tests do I need? Blood tests  Lipid and cholesterol levels. These may be checked every 5 years, or more frequently depending on your overall health.  Hepatitis C test.  Hepatitis B test. Screening  Lung cancer screening. You may have this screening every year starting at age 72 if you have a 30-pack-year history of smoking and currently smoke or have quit within the past 15 years.  Colorectal cancer screening. All adults should have this screening starting at age 72 and continuing until age 15. Your health care provider may recommend screening at age 23 if you are at increased risk. You will have tests every 1-10 years, depending on your results and the type of screening test.  Diabetes screening. This is done by checking your blood sugar (glucose) after you have not eaten for a while (fasting). You may have this done every 1-3 years.  Mammogram. This may be done every 1-2 years. Talk with your health care provider about how often you should have regular mammograms.  BRCA-related cancer screening. This may be done if you have a family history of breast, ovarian, tubal, or peritoneal cancers.  Other tests  Sexually transmitted disease (STD) testing.  Bone density scan. This is done to screen for osteoporosis. You may have this done starting at age 72. Follow these instructions at home: Eating and drinking  Eat a diet that includes fresh fruits and vegetables, whole grains, lean protein, and low-fat dairy products. Limit  your intake of foods with high amounts of sugar, saturated fats, and salt.  Take vitamin and mineral supplements as recommended by your health care provider.  Do not drink alcohol if your health care provider tells you not to drink.  If you drink alcohol: ? Limit how much you have to 0-1 drink a day. ? Be aware of how much alcohol is in your drink. In the [place] one drink equals one 12 oz bottle of beer (355 mL), one 5 oz glass of wine (148 mL), or one 1 oz glass of hard liquor (44 mL). Lifestyle  Take daily care of your teeth and gums.  Stay active. Exercise for at least 30 minutes on 5 or more days each week.  Do not use any products that contain nicotine or tobacco, such as cigarettes, e-cigarettes, and chewing tobacco. If you need help quitting, ask your health care provider.  If you are sexually active, practice safe sex. Use a condom or other form of protection in order to prevent STIs (sexually transmitted infections).  Talk with your health care provider about taking a low-dose aspirin or statin. What's next?  Go to your health care provider once a year for a well check visit.  Ask your health care provider how often you should have your eyes and teeth checked.  Stay up to date on all vaccines. This information is not intended to replace advice given to you by your health care provider. Make sure you discuss any questions you have with your health care provider. Document Released: 06/14/2015 Document Revised: 05/12/2018 Document Reviewed: 05/12/2018 Elsevier Patient Education  2020 [employer].

## 2019-04-06 NOTE — Assessment & Plan Note (Signed)
Well controlled, no changes to meds. Encouraged heart healthy diet such as the DASH diet and exercise as tolerated.  Ran high at the dentist but she had not taken her medication

## 2019-04-06 NOTE — Assessment & Plan Note (Signed)
No cp stable

## 2019-04-06 NOTE — Assessment & Plan Note (Signed)
Tolerating statin, encouraged heart healthy diet, avoid trans fats, minimize simple carbs and saturated fats. Increase exercise as tolerated 

## 2019-04-07 ENCOUNTER — Telehealth: Payer: Self-pay

## 2019-04-07 ENCOUNTER — Other Ambulatory Visit: Payer: Self-pay | Admitting: Gastroenterology

## 2019-04-07 DIAGNOSIS — Z1159 Encounter for screening for other viral diseases: Secondary | ICD-10-CM | POA: Diagnosis not present

## 2019-04-07 LAB — COMPREHENSIVE METABOLIC PANEL
ALT: 15 U/L (ref 0–35)
AST: 20 U/L (ref 0–37)
Albumin: 4.7 g/dL (ref 3.5–5.2)
Alkaline Phosphatase: 42 U/L (ref 39–117)
BUN: 21 mg/dL (ref 6–23)
CO2: 28 mEq/L (ref 19–32)
Calcium: 9.6 mg/dL (ref 8.4–10.5)
Chloride: 99 mEq/L (ref 96–112)
Creatinine, Ser: 0.72 mg/dL (ref 0.40–1.20)
GFR: 79.6 mL/min (ref >60.00)
Glucose, Bld: 92 mg/dL (ref 70–99)
Potassium: 4.1 mEq/L (ref 3.5–5.1)
Sodium: 138 mEq/L (ref 135–145)
Total Bilirubin: 0.6 mg/dL (ref 0.2–1.2)
Total Protein: 7 g/dL (ref 6.0–8.3)

## 2019-04-07 LAB — LIPID PANEL
Cholesterol: 184 mg/dL (ref 0–200)
HDL: 80.1 mg/dL (ref >39.00)
LDL Cholesterol: 89 mg/dL (ref 0–99)
NonHDL: 103.88
Total CHOL/HDL Ratio: 2
Triglycerides: 73 mg/dL (ref 0.0–149.0)
VLDL: 14.6 mg/dL (ref 0.0–40.0)

## 2019-04-07 LAB — TSH: TSH: 1.26 u[IU]/mL (ref 0.35–4.50)

## 2019-04-07 LAB — SARS CORONAVIRUS 2 (TAT 6-24 HRS): SARS Coronavirus 2: NEGATIVE

## 2019-04-07 NOTE — Telephone Encounter (Signed)
Sent patients information to insurance, waiting on summary of benefits.

## 2019-04-07 NOTE — Telephone Encounter (Signed)
-----   Message from Ann Held, DO sent at 04/06/2019  3:01 PM EST ----- Can we look into evenity  / prolia for her ?

## 2019-04-10 ENCOUNTER — Ambulatory Visit (AMBULATORY_SURGERY_CENTER): Payer: Medicare Other | Admitting: Gastroenterology

## 2019-04-10 ENCOUNTER — Encounter: Payer: Self-pay | Admitting: Gastroenterology

## 2019-04-10 ENCOUNTER — Encounter: Payer: Medicare Other | Admitting: Gastroenterology

## 2019-04-10 ENCOUNTER — Other Ambulatory Visit: Payer: Self-pay

## 2019-04-10 VITALS — BP 133/58 | HR 91 | Temp 97.7°F | Resp 22 | Ht 65.0 in | Wt 195.0 lb

## 2019-04-10 DIAGNOSIS — D375 Neoplasm of uncertain behavior of rectum: Secondary | ICD-10-CM | POA: Diagnosis not present

## 2019-04-10 DIAGNOSIS — D128 Benign neoplasm of rectum: Secondary | ICD-10-CM

## 2019-04-10 DIAGNOSIS — Z86010 Personal history of colonic polyps: Secondary | ICD-10-CM | POA: Diagnosis not present

## 2019-04-10 MED ORDER — SODIUM CHLORIDE 0.9 % IV SOLN: 500.0000 mL | Freq: Once | INTRAVENOUS | Status: DC

## 2019-04-10 NOTE — Op Note (Signed)
[facility] Patient Name: Tina [name] Procedure Date: 04/10/2019 1:18 PM MRN: [id number] Endoscopist: Mallie Mussel L. [name] , MD Age: 73 Referring MD:  Date of Birth: 06-08-1945 Gender: Female Account #: [id number] Procedure:                Colonoscopy Indications:              Follow-up for history of adenomatous polyps in the                            rectum (large villous adenoma 04/2018, surgical                            referral placed at that time.) Medicines:                Monitored Anesthesia Care Procedure:                Pre-Anesthesia Assessment:                           - Prior to the procedure, a History and Physical                            was performed, and patient medications and                            allergies were reviewed. The patient's tolerance of                            previous anesthesia was also reviewed. The risks                            and benefits of the procedure and the sedation                            options and risks were discussed with the patient.                            All questions were answered, and informed consent                            was obtained. Prior Anticoagulants: The patient has                            taken no previous anticoagulant or antiplatelet                            agents except for aspirin. ASA Grade Assessment: II                            - A patient with mild systemic disease. After                            reviewing the risks and benefits, the patient was  deemed in satisfactory condition to undergo the                            procedure.                           After obtaining informed consent, the colonoscope                            was passed under direct vision. Throughout the                            procedure, the patient's blood pressure, pulse, and                            oxygen saturations were monitored continuously. The                 Colonoscope was introduced through the anus and                            advanced to the the cecum, identified by                            appendiceal orifice and ileocecal valve. The                            colonoscopy was somewhat difficult due to multiple                            diverticula in the colon. The patient tolerated the                            procedure well. The quality of the bowel                            preparation was good. The ileocecal valve,                            appendiceal orifice, and rectum were photographed. Scope In: 1:31:59 PM Scope Out: 1:56:22 PM Scope Withdrawal Time: 0 hours 15 minutes 49 seconds  Total Procedure Duration: 0 hours 24 minutes 23 seconds  Findings:                 The perianal and digital rectal examinations were                            normal.                           A frond-like/villous and polypoid partially                            obstructing large mass was found in the proximal to  mid rectum (10-12 cm from anal verge). The mass was                            partially circumferential (involving one-half of                            the lumen circumference). No bleeding was present.                           The previously-placed tattoos proximal and distal                            to the mass were visible. The distal tattoo is                            approximately on the most distal rectal fold.                            Biopsies were taken with a cold forceps for                            histology. (tissue soft)                           Many diverticula were found in the left colon and                            right colon.                           The exam was otherwise without abnormality on                            direct and retroflexion views. Complications:            No immediate complications. Estimated Blood Loss:     Estimated blood loss was  minimal. Impression:               - Partially obstructing tumor in the proximal                            rectum. Biopsied.                           - Diverticulosis in the left colon and in the right                            colon.                           - The examination was otherwise normal on direct                            and retroflexion views. Recommendation:           - Patient has a contact number available for  emergencies. The signs and symptoms of potential                            delayed complications were discussed with the                            patient. Return to normal activities tomorrow.                            Written discharge instructions were provided to the                            patient.                           - Resume previous diet.                           - Continue present medications.                           - Await pathology results.                           - Repeat colonoscopy is recommended for                            surveillance. The colonoscopy date will be                            determined after pathology results from today's                            exam become available for review.                           - Refer to a surgeon at appointment to be scheduled. Maurizio Geno L. [name], MD 04/10/2019 2:12:24 PM This report has been signed electronically.

## 2019-04-10 NOTE — Progress Notes (Signed)
Pt's states no medical or surgical changes since previsit or office visit.  CW vtials, HC IV and LC temp.

## 2019-04-10 NOTE — Patient Instructions (Addendum)
Thank you for allowing [place] to care for you today.   Resume previous diet and medications today.  Return to your normal activities tomorrow   We are sending CT contrast with you today.  Await phone call with appointment details and fill out times to take the contrast on the  form that is with the contrast.      YOU HAD AN ENDOSCOPIC PROCEDURE TODAY AT [place]:   Refer to the procedure report that was given to you for any specific questions about what was found during the examination.  If the procedure report does not answer your questions, please call your gastroenterologist to clarify.  If you requested that your care partner not be given the details of your procedure findings, then the procedure report has been included in a sealed envelope for you to review at your convenience later.  YOU SHOULD EXPECT: Some feelings of bloating in the abdomen. Passage of more gas than usual.  Walking can help get rid of the air that was put into your GI tract during the procedure and reduce the bloating. If you had a lower endoscopy (such as a colonoscopy or flexible sigmoidoscopy) you may notice spotting of blood in your stool or on the toilet paper. If you underwent a bowel prep for your procedure, you may not have a normal bowel movement for a few days.  Please Note:  You might notice some irritation and congestion in your nose or some drainage.  This is from the oxygen used during your procedure.  There is no need for concern and it should clear up in a day or so.  SYMPTOMS TO REPORT IMMEDIATELY:   Following lower endoscopy (colonoscopy or flexible sigmoidoscopy):  Excessive amounts of blood in the stool  Significant tenderness or worsening of abdominal pains  Swelling of the abdomen that is new, acute  Fever of 100F or higher   For urgent or emergent issues, a gastroenterologist can be reached at any hour by calling [phone].   DIET:  We do recommend a small meal at  first, but then you may proceed to your regular diet.  Drink plenty of fluids but you should avoid alcoholic beverages for 24 hours.  ACTIVITY:  You should plan to take it easy for the rest of today and you should NOT DRIVE or use heavy machinery until tomorrow (because of the sedation medicines used during the test).    FOLLOW UP: Our staff will call the number listed on your records 48-72 hours following your procedure to check on you and address any questions or concerns that you may have regarding the information given to you following your procedure. If we do not reach you, we will leave a message.  We will attempt to reach you two times.  During this call, we will ask if you have developed any symptoms of COVID 19. If you develop any symptoms (ie: fever, flu-like symptoms, shortness of breath, cough etc.) before then, please call [phone].  If you test positive for Covid 19 in the 2 weeks post procedure, please call and report this information to [place].    If any biopsies were taken you will be contacted by phone or by letter within the next 1-3 weeks.  Please call us at [phone] if you have not heard about the biopsies in 3 weeks.    SIGNATURES/CONFIDENTIALITY: You and/or your care partner have signed paperwork which will be entered into your electronic medical record.  These  signatures attest to the fact that that the information above on your After Visit Summary has been reviewed and is understood.  Full responsibility of the confidentiality of this discharge information lies with you and/or your care-partner. 

## 2019-04-10 NOTE — Progress Notes (Signed)
Pt tolerated well. VSS. NSR. Awake and to recovery.

## 2019-04-10 NOTE — Progress Notes (Signed)
Called to room to assist during endoscopic procedure.  Patient ID and intended procedure confirmed with present staff. Received instructions for my participation in the procedure from the performing physician.  

## 2019-04-11 ENCOUNTER — Other Ambulatory Visit: Payer: Self-pay | Admitting: *Deleted

## 2019-04-11 ENCOUNTER — Telehealth: Payer: Self-pay | Admitting: *Deleted

## 2019-04-11 DIAGNOSIS — D128 Benign neoplasm of rectum: Secondary | ICD-10-CM

## 2019-04-11 DIAGNOSIS — K6289 Other specified diseases of anus and rectum: Secondary | ICD-10-CM

## 2019-04-11 NOTE — Telephone Encounter (Signed)
Referral faxed to [facility] for partially obstructing rectal villous adenoma. Will continue to f/u to ensure patient is scheduled.   CT chest/abdomen/pelvis scheduled at [facility] on 11/16 at 9:00 am with an arrival time of 8:45 am. Contrast given in [place], patient aware to drink 1 bottle at 8 am and 1 at 7 am and to have nothing to eat or drink for 4 hours prior to the exam.

## 2019-04-11 NOTE — Telephone Encounter (Signed)
-----   Message from Nelida Meuse III, MD sent at 04/10/2019  5:12 PM EST ----- Colonoscopy done today  Please send referral to [facility] for partially-obstructing rectal villous adenoma. (track referral to make sure she is seen - was not seen by surgery after referral to them for this same polyp last year)  Order CT scan chest/abdomen/pelvis.  Indication: rectal mass.  She had BUN and creatinine done last week, and rec'd contrast in [place].

## 2019-04-12 ENCOUNTER — Telehealth: Payer: Self-pay | Admitting: *Deleted

## 2019-04-12 NOTE — Telephone Encounter (Signed)
  Follow up Call-  Call back number 04/10/2019 04/15/2018  Post procedure Call Back phone  # [phone]  Permission to leave phone message Yes Yes  Some recent data might be hidden     Patient questions:  Do you have a fever, pain , or abdominal swelling? No. Pain Score  0 *  Have you tolerated food without any problems? Yes.    Have you been able to return to your normal activities? Yes.    Do you have any questions about your discharge instructions: Diet   No. Medications  No. Follow up visit  No.  Do you have questions or concerns about your Care? No.  Actions: * If pain score is 4 or above: No action needed, pain <4.  1. Have you developed a fever since your procedure? no  2.   Have you had an respiratory symptoms (SOB or cough) since your procedure? no  3.   Have you tested positive for COVID 19 since your procedure no  4.   Have you had any family members/close contacts diagnosed with the COVID 19 since your procedure?  no   If yes to any of these questions please route to Joylene John, RN and Alphonsa Gin, Therapist, sports.

## 2019-04-17 ENCOUNTER — Encounter (HOSPITAL_BASED_OUTPATIENT_CLINIC_OR_DEPARTMENT_OTHER): Payer: Self-pay

## 2019-04-17 ENCOUNTER — Other Ambulatory Visit: Payer: Self-pay

## 2019-04-17 ENCOUNTER — Ambulatory Visit (HOSPITAL_BASED_OUTPATIENT_CLINIC_OR_DEPARTMENT_OTHER)
Admission: RE | Admit: 2019-04-17 | Discharge: 2019-04-17 | Disposition: A | Discharge status: Home / Self Care | Payer: Medicare Other | Source: Ambulatory Visit | Attending: Gastroenterology | Admitting: Gastroenterology

## 2019-04-17 DIAGNOSIS — D128 Benign neoplasm of rectum: Secondary | ICD-10-CM | POA: Diagnosis not present

## 2019-04-17 DIAGNOSIS — K6289 Other specified diseases of anus and rectum: Secondary | ICD-10-CM | POA: Diagnosis not present

## 2019-04-17 DIAGNOSIS — K7689 Other specified diseases of liver: Secondary | ICD-10-CM | POA: Diagnosis not present

## 2019-04-17 DIAGNOSIS — R911 Solitary pulmonary nodule: Secondary | ICD-10-CM | POA: Diagnosis not present

## 2019-04-17 MED ORDER — IOHEXOL 300 MG/ML  SOLN
100.0000 mL | Freq: Once | INTRAMUSCULAR | Status: AC | PRN
  Administered 2019-04-17: 100 mL via INTRAVENOUS

## 2019-04-18 ENCOUNTER — Encounter: Payer: Self-pay | Admitting: Family Medicine

## 2019-04-18 NOTE — Telephone Encounter (Signed)
Did they mean contact the provider that did the CT?  That would be Dr [name]

## 2019-05-15 ENCOUNTER — Other Ambulatory Visit: Payer: Self-pay

## 2019-05-15 ENCOUNTER — Encounter: Payer: Self-pay | Admitting: Surgery

## 2019-05-15 ENCOUNTER — Ambulatory Visit
Admission: RE | Admit: 2019-05-15 | Discharge: 2019-05-15 | Disposition: A | Discharge status: Home / Self Care | Payer: Medicare Other | Source: Ambulatory Visit | Attending: Family Medicine | Admitting: Family Medicine

## 2019-05-15 ENCOUNTER — Ambulatory Visit: Payer: Self-pay | Admitting: Surgery

## 2019-05-15 DIAGNOSIS — Z1231 Encounter for screening mammogram for malignant neoplasm of breast: Secondary | ICD-10-CM | POA: Diagnosis not present

## 2019-05-15 DIAGNOSIS — I252 Old myocardial infarction: Secondary | ICD-10-CM | POA: Diagnosis not present

## 2019-05-15 DIAGNOSIS — D128 Benign neoplasm of rectum: Secondary | ICD-10-CM | POA: Insufficient documentation

## 2019-05-15 NOTE — H&P (Signed)
Tina [name] Documented: 05/15/2019 11:13 AM Location: [facility] Patient #: [id number] DOB: [birth year] Divorced / Language: Cleophus [name] / Race: White Female  History of Present Illness Adin Hector MD; 05/15/2019 1:08 PM) The patient is a 73 year old female who presents with a colorectal polyp. Note for "Colorectal polyp": ` ` ` Patient sent for surgical consultation at the request of Tina [name], [facility]  Chief Complaint: Proximal rectal mass. ` ` The patient is a pleasant woman. Originally from [place]. 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 She had a positive Colocort underwent colonoscopy last year. Pulse removed. Polypoid mass noted in proximal rectum. About 10-12 centimeters from anal verge. Biopsy consistent with adenomatous polyp. Recommended to follow-up with surgery. That did not happen. She tells me she was waiting for surgery to call. We did not get notified as far as I can tell. Then the [place] pandemic happened, and held off. Popped upon gastrology radar in October. Dr. Loletha [name] recommended repeat colonoscopy. Again polyp noted. Again adenomatous. No definite cancer high-grade dysplasia. CT scan done since bulky size. No evidence of frank metastatic disease. Do not definitely see the mass (although I suspect it sent the rectosigmoid junction to my view). Surgical consultation again requested. She comes in by herself.  Patient lives by herself. She has a daughter in [place]. She usually moves her bowels every day. His never had any abdominal surgery. She had a heart attack in 2004. Required stenting. That was when she lived in [place]. She's quit smoking. She seen Dr. Wyatt [name] with cardiology since she is, Tina [name]. Has not seen her in the past few years. Tends to get care through Dr. [name]. Claims she can walk a half hour without difficulty. She is to use a treadmill rather regularly but has backed off and gain some weight as a result. No  rectal bleeding. No personal nor family history of inflammatory bowel disease, irritable bowel syndrome, allergy such as Celiac Sprue, dietary/dairy problems, colitis, ulcers nor gastritis. No recent sick contacts/gastroenteritis. No travel outside the country. No changes in diet. No dysphagia to solids or liquids. No significant heartburn or reflux. No hematochezia, hematemesis, coffee ground emesis. No evidence of prior gastric/peptic ulceration.  (Review of systems as stated in this history (HPI) or in the review of systems. Otherwise all other 12 point ROS are negative) ` ` `   Past Surgical History Tina [name], [place]; 05/15/2019 11:13 AM) No pertinent past surgical history  Diagnostic Studies History Tina [name], [place]; 05/15/2019 11:13 AM) Colonoscopy within last year Mammogram within last year Pap Smear 1-5 years ago  Allergies Tina [name], CMA; 05/15/2019 11:14 AM) No Known Drug Allergies [05/15/2019]: Allergies Reconciled  Medication History Tina [name], CMA; 05/15/2019 11:15 AM) Aspirin (81MG  Tablet, Oral) Active. Calcium Carbonate (1250 (500 Ca)MG Capsule, Oral) Active. Fish Oil (300MG  Capsule, Oral) Active. Rosuvastatin Calcium (20MG  Tablet, Oral) Active. Ramipril (5MG  Capsule, Oral) Active. Alendronate Sodium (70MG  Tablet, Oral) Active. Medications Reconciled  Social History Tina [name], [place]; 05/15/2019 11:13 AM) Alcohol use Moderate alcohol use. Caffeine use Carbonated beverages, Coffee. No drug use Tobacco use Former smoker.  Family History Tina [name], [place]; 05/15/2019 11:13 AM) Arthritis Brother. Cancer Sister. Heart Disease Father. Heart disease in female family member before age 63 Hypertension Brother, Mother, Sister. Ovarian Cancer Mother. Seizure disorder Daughter.  Pregnancy / Birth History Tina [name], [place]; 05/15/2019 11:13 AM) Age at menarche 88 years. Age of menopause  37-61 Gravida 2 Length (months) of breastfeeding 3-6 Maternal age 44-25 Para  2  Other Problems Tina [name], CMA; 05/15/2019 11:13 AM) Chest pain Gastroesophageal Reflux Disease Hemorrhoids High blood pressure Hypercholesterolemia Myocardial infarction Sleep Apnea Thyroid Disease     Review of Systems Tina [name] CMA; 05/15/2019 11:13 AM) General Not Present- Appetite Loss, Chills, Fatigue, Fever, Night Sweats, Weight Gain and Weight Loss. Skin Not Present- Change in Wart/Mole, Dryness, Hives, Jaundice, New Lesions, Non-Healing Wounds, Rash and Ulcer. HEENT Present- Seasonal Allergies and Wears glasses/contact lenses. Not Present- Earache, Hearing Loss, Hoarseness, Nose Bleed, Oral Ulcers, Ringing in the Ears, Sinus Pain, Sore Throat, Visual Disturbances and Yellow Eyes. Respiratory Present- Snoring. Not Present- Bloody sputum, Chronic Cough, Difficulty Breathing and Wheezing. Breast Not Present- Breast Mass, Breast Pain, Nipple Discharge and Skin Changes. Cardiovascular Not Present- Chest Pain, Difficulty Breathing Lying Down, Leg Cramps, Palpitations, Rapid Heart Rate, Shortness of Breath and Swelling of Extremities. Gastrointestinal Present- Bloating, Excessive gas and Hemorrhoids. Not Present- Abdominal Pain, Bloody Stool, Change in Bowel Habits, Chronic diarrhea, Constipation, Difficulty Swallowing, Gets full quickly at meals, Indigestion, Nausea, Rectal Pain and Vomiting. Female Genitourinary Present- Frequency and Urgency. Not Present- Nocturia, Painful Urination and Pelvic Pain. Musculoskeletal Present- Muscle Weakness. Not Present- Back Pain, Joint Pain, Joint Stiffness, Muscle Pain and Swelling of Extremities. Neurological Present- Decreased Memory. Not Present- Fainting, Headaches, Numbness, Seizures, Tingling, Tremor, Trouble walking and Weakness. Psychiatric Not Present- Anxiety, Bipolar, Change in Sleep Pattern, Depression, Fearful and Frequent  crying. Endocrine Present- Hair Changes. Not Present- Cold Intolerance, Excessive Hunger, Heat Intolerance, Hot flashes and New Diabetes. Hematology Not Present- Blood Thinners, Easy Bruising, Excessive bleeding, Gland problems, HIV and Persistent Infections.  Vitals Tina [name] CMA; 05/15/2019 11:14 AM) 05/15/2019 11:14 AM Weight: 201 lb Height: 65in Body Surface Area: 1.98 m Body Mass Index: 33.45 kg/m  Temp.: 97.61F  Pulse: 82 (Regular)  BP: 132/82 (Sitting, Left Arm, Standard)        Physical Exam Adin Hector MD; 05/15/2019 1:09 PM)  General Mental Status-Alert. General Appearance-Not in acute distress, Not Sickly. Orientation-Oriented X3. Hydration-Well hydrated. Voice-Normal.  Integumentary Global Assessment Upon inspection and palpation of skin surfaces of the - Axillae: non-tender, no inflammation or ulceration, no drainage. and Distribution of scalp and body hair is normal. General Characteristics Temperature - normal warmth is noted.  Head and Neck Head-normocephalic, atraumatic with no lesions or palpable masses. Face Global Assessment - atraumatic, no absence of expression. Neck Global Assessment - no abnormal movements, no bruit auscultated on the right, no bruit auscultated on the left, no decreased range of motion, non-tender. Trachea-midline. Thyroid Gland Characteristics - non-tender.  Eye Eyeball - Left-Extraocular movements intact, No Nystagmus - Left. Eyeball - Right-Extraocular movements intact, No Nystagmus - Right. Cornea - Left-No Hazy - Left. Cornea - Right-No Hazy - Right. Sclera/Conjunctiva - Left-No scleral icterus, No Discharge - Left. Sclera/Conjunctiva - Right-No scleral icterus, No Discharge - Right. Pupil - Left-Direct reaction to light normal. Pupil - Right-Direct reaction to light normal.  ENMT Ears Pinna - Left - no drainage observed, no generalized tenderness observed.  Pinna - Right - no drainage observed, no generalized tenderness observed. Nose and Sinuses External Inspection of the Nose - no destructive lesion observed. Inspection of the nares - Left - quiet respiration. Inspection of the nares - Right - quiet respiration. Mouth and Throat Lips - Upper Lip - no fissures observed, no pallor noted. Lower Lip - no fissures observed, no pallor noted. Nasopharynx - no discharge present. Oral Cavity/Oropharynx - Tongue - no dryness observed. Oral Mucosa - no  cyanosis observed. Hypopharynx - no evidence of airway distress observed.  Chest and Lung Exam Inspection Movements - Normal and Symmetrical. Accessory muscles - No use of accessory muscles in breathing. Palpation Palpation of the chest reveals - Non-tender. Auscultation Breath sounds - Normal and Clear.  Cardiovascular Auscultation Rhythm - Regular. Murmurs & Other Heart Sounds - Auscultation of the heart reveals - No Murmurs and No Systolic Clicks.  Abdomen Inspection Inspection of the abdomen reveals - No Visible peristalsis and No Abnormal pulsations. Umbilicus - No Bleeding, No Urine drainage. Palpation/Percussion Palpation and Percussion of the abdomen reveal - Soft, Non Tender, No Rebound tenderness, No Rigidity (guarding) and No Cutaneous hyperesthesia. Note: Abdomen overweight but soft. Not severely distended. No distasis recti. No umbilical or other anterior abdominal wall hernias  Female Genitourinary Sexual Maturity Tanner 5 - Adult hair pattern. Note: No incisions. No inguinal hernias. No lymphadenopathy. No vaginal bleeding nor discharge  Rectal Note: Perianal skin clear. Anterior anal tag. Normal sphincter tone. I feel no rectal masses to 7 cm circumferentially. Dilated rectum with large volume of soft stool in rectal vault. Thin rectovaginal septum with rectocele.  Peripheral Vascular Upper Extremity Inspection - Left - No Cyanotic nailbeds - Left, Not Ischemic. Inspection  - Right - No Cyanotic nailbeds - Right, Not Ischemic.  Neurologic Neurologic evaluation reveals -normal attention span and ability to concentrate, able to name objects and repeat phrases. Appropriate fund of knowledge , normal sensation and normal coordination. Mental Status Affect - not angry, not paranoid. Cranial Nerves-Normal Bilaterally. Gait-Normal.  Neuropsychiatric Mental status exam performed with findings of-able to articulate well with normal speech/language, rate, volume and coherence, thought content normal with ability to perform basic computations and apply abstract reasoning and no evidence of hallucinations, delusions, obsessions or homicidal/suicidal ideation.  Musculoskeletal Global Assessment Spine, Ribs and Pelvis - no instability, subluxation or laxity. Right Upper Extremity - no instability, subluxation or laxity.  Lymphatic Head & Neck  General Head & Neck Lymphatics: Bilateral - Description - No Localized lymphadenopathy. Axillary  General Axillary Region: Bilateral - Description - No Localized lymphadenopathy. Femoral & Inguinal  Generalized Femoral & Inguinal Lymphatics: Left - Description - No Localized lymphadenopathy. Right - Description - No Localized lymphadenopathy.    Assessment & Plan Adin Hector MD; 05/15/2019 12:03 PM)  ADENOMATOUS RECTAL POLYP (D12.8) Impression: Bulky proximal rectal mass. Biopsy consistent with adenoma. No definite metastatic disease.  I think she'll benefit from removal of this mass. Because it is rather proximal and looks like the rectosigmoid on CT scan, I think this would be too much to try and do by TEM. I recommend a low anterior resection for better margins. I think this is proximal enough that the risk of needing diverting loop ileostomy and leak is rather low. She is otherwise active. She wishes to be aggressive and proceed with surgery.  She would like to try an time this around the time in February  when her daughters coming up to visit her in [place]. That is not unreasonable.  She has a distant history of having a myocardial infarction. It's been over a decade. She did have a stent. She seen Dr. Wyatt [name] intermittently. Last note I see [place] in 2016. Followed by her primary care office rather regularly. I think he would be a safe idea to get clearance from Dr. Harrington Challenger. Perhaps just a clearance letter and nothing more.  Current Plans Pt Education - Polyps in the Colon and Rectum (Colonic and Rectal Polyps): colonic  polyps You are being scheduled for surgery- Our schedulers will call you.  You should hear from our office's scheduling department within 5 working days about the location, date, and time of surgery. We try to make accommodations for patient's preferences in scheduling surgery, but sometimes the OR schedule or the surgeon's schedule prevents [place] from making those accommodations.  If you have not heard from our office [phone]) in 5 working days, call the office and ask for your surgeon's nurse.  If you have other questions about your diagnosis, plan, or surgery, call the office and ask for your surgeon's nurse.  The anatomy & physiology of the digestive tract was discussed. The pathophysiology of the rectal pathology was discussed. Natural history risks without surgery was discussed. I worked to give an overview of the disease and the frequent need to have multispecialty involvement. I feel the risks of no intervention will lead to serious problems that outweigh the operative risks; therefore, I recommended a partial proctocolectomy to remove the pathology. Minimally Invasive (Robotic/Laparoscopic) & open techniques were discussed. We will work to preserve anal & pelvic floor function without sacrificing cure.  Risks such as bleeding, infection, abscess, leak, reoperation, possible temporary or permanent ostomy, hernia, heart attack, death, and other risks were discussed. I  noted a good likelihood this will help address the problem. Goals of post-operative recovery were discussed as well. We will work to minimize complications. Educational information was available as well. Questions were answered. The patient expresses understanding & wishes to proceed with surgery.   PREOP COLON - ENCOUNTER FOR PREOPERATIVE EXAMINATION FOR GENERAL SURGICAL PROCEDURE [id number])  Current Plans Written instructions provided Pt Education - [facility] Colon Bowel Prep 2018 ERAS/Miralax/Antibiotics Started Neomycin Sulfate 500 MG Oral Tablet, 2 (two) Tablet SEE NOTE, #6, 05/15/2019, No Refill. Local Order: Pharmacist Notes: TAKE TWO TABLETS AT 2 PM, 3 PM, AND 10 PM THE DAY PRIOR TO SURGERY Started Flagyl 500 MG Oral Tablet, 2 (two) Tablet SEE NOTE, #6, 05/15/2019, No Refill. Local Order: Pharmacist Notes: Take at 2pm, 3pm, and 10pm the day prior to your colon operation Pt Education - Pamphlet Given - Laparoscopic Colorectal Surgery: discussed with patient and provided information. Pt Education - [facility] Colectomy post-op instructions: discussed with patient and provided information.  HISTORY OF MYOCARDIAL INFARCT AT AGE LESS THAN 60 YEARS (I25.2)  Current Plans I recommended obtaining preoperative cardiac clearance. I am concerned about the health of the patient and the ability to tolerate the operation. Therefore, we will request clearance by cardiology to better assess operative risk & see if a reevaluation, further workup, etc is needed. Also recommendations on how medications such as for anticoagulation and blood pressure should be managed/held/restarted after surgery.  Adin Hector, MD, FACS, MASCRS Gastrointestinal and Minimally Invasive Surgery  [facility] [address] [place], [place] [address] [phone] Main / Paging [phone] Fax

## 2019-05-16 ENCOUNTER — Encounter: Payer: Self-pay | Admitting: Family Medicine

## 2019-05-17 ENCOUNTER — Other Ambulatory Visit: Payer: Self-pay | Admitting: Family Medicine

## 2019-05-17 DIAGNOSIS — I10 Essential (primary) hypertension: Secondary | ICD-10-CM

## 2019-05-17 NOTE — Telephone Encounter (Signed)
Referral placed.

## 2019-05-18 ENCOUNTER — Telehealth: Payer: Self-pay | Admitting: *Deleted

## 2019-05-18 NOTE — Telephone Encounter (Signed)
   [facility] Pre-operative Risk Assessment    Request for surgical clearance: PT LAST SEEN 2016 AND WILL NEED A NEW PT APPT: MESSAGE HAS BEEN SENT TO THE SCHEDULERS TO REACH OUT TO THE PT WITH AN APPT 1. What type of surgery is being performed? ROBOTIC RESECTION OF RECTOSIGMOID COLON  2. When is this surgery scheduled? TBD  3. What type of clearance is required (medical clearance vs. Pharmacy clearance to hold med vs. Both)? MEDICAL  4. Are there any medications that need to be held prior to surgery and how long? ASA  5. Practice name and name of physician performing surgery? DR. Remo Lipps [facility]  6. What is your office phone number [phone]   7.   What is your office fax number [phone]  8.   Anesthesia type (None, local, MAC, general) ? NOT LISTED   Julaine Hua 05/18/2019, 4:42 PM  _________________________________________________________________   (provider comments below)

## 2019-05-18 NOTE — Telephone Encounter (Signed)
Pre Op Clearance noted on appt notes. I will forward clearance to Dr. Harrington Challenger for appt. I will also send notes to surgeon Dr. Michael Boston. I will remove from the pre op call back pool.

## 2019-05-18 NOTE — Telephone Encounter (Signed)
Can pt get added on with me   Friday, next week

## 2019-05-18 NOTE — Telephone Encounter (Signed)
Pre-op call back  Please have new patient appointment reflect the need for pre-operative evaluation   Thank you  Sharee Pimple

## 2019-05-19 NOTE — Telephone Encounter (Signed)
I called [facility] to inform them that we were able to move appt up to 05/24/19 with Dr. Dorris Carnes @ 1:20 pm. Surgeon's office thanked me for the update.

## 2019-05-19 NOTE — Telephone Encounter (Signed)
Our office received another clearance for the pt, same surgery.Pt is currently scheduled to see Dr. Harrington Challenger 06/26/19 as a New Pt as pt was last seen 2016. I called the surgeon's office and informed that the pt is currently scheduled 06/26/19 with Dr. Harrington Challenger as a New Pt, though a message from Dr. Harrington Challenger asking her nurse to see if she can get the pt in sooner. I informed the surgeon's office as soon as we have clearance we will fax clearance notes over. Surgeon's office was thankful for the call and the update.

## 2019-05-20 ENCOUNTER — Other Ambulatory Visit: Payer: Self-pay | Admitting: Family Medicine

## 2019-05-20 DIAGNOSIS — I10 Essential (primary) hypertension: Secondary | ICD-10-CM

## 2019-05-23 ENCOUNTER — Encounter: Payer: Self-pay | Admitting: Family Medicine

## 2019-05-23 MED ORDER — ROSUVASTATIN CALCIUM 20 MG PO TABS: 10.0000 mg | ORAL_TABLET | Freq: Every day | ORAL | 1 refills | Status: DC

## 2019-05-24 ENCOUNTER — Other Ambulatory Visit: Payer: Self-pay

## 2019-05-24 ENCOUNTER — Encounter: Payer: Self-pay | Admitting: Internal Medicine

## 2019-05-24 ENCOUNTER — Ambulatory Visit: Payer: Medicare Other | Admitting: Internal Medicine

## 2019-05-24 VITALS — BP 134/74 | HR 78 | Temp 97.5°F | Ht 65.0 in | Wt 195.0 lb

## 2019-05-24 DIAGNOSIS — R001 Bradycardia, unspecified: Secondary | ICD-10-CM | POA: Diagnosis not present

## 2019-05-24 NOTE — Patient Instructions (Signed)
Medication Instructions:  Your physician recommends that you continue on your current medications as directed. Please refer to the Current Medication list given to you today.  *If you need a refill on your cardiac medications before your next appointment, please call your pharmacy*  Lab Work: NONE  If you have labs (blood work) drawn today and your tests are completely normal, you will receive your results only by: Marland Kitchen MyChart Message (if you have MyChart) OR . A paper copy in the mail If you have any lab test that is abnormal or we need to change your treatment, we will call you to review the results.  Testing/Procedures: NONE   Follow-Up: At [facility], you and your health needs are our priority.  As part of our continuing mission to provide you with exceptional heart care, we have created designated Provider Care Teams.  These Care Teams include your primary Cardiologist (physician) and Advanced Practice Providers (APPs -  Physician Assistants and Nurse Practitioners) who all work together to provide you with the care you need, when you need it.  Your next appointment:   1 year(s)  The format for your next appointment:   In Person  Provider:   You may see No primary care provider on file. or one of the following Advanced Practice Providers on your designated Care Team:    Richardson Dopp, PA-C  [place], [place]  Daune Perch, NP   Other Instructions Thank you for choosing [facility]!

## 2019-05-24 NOTE — Progress Notes (Signed)
Cardiology Office Note   Date:  05/24/2019   ID:  Tina [name], DOB 1945/07/14, MRN [id number]  PCP:  Carollee Herter, Alferd Apa, DO  Cardiologist:   Dorris Carnes, MD   Pt presents for preop cardiac risk stratification.   History of Present Illness: Tina [name] is a 73 y.o. female with a history of hypertension, hyperlipidemia, goiter.  with a history of CADs/p NSTEMI in 2004 with PTCA/stent to LAD). I last saw her in 2016   She continues to follow with Dr. Etter Sjogren.  In the interval she has done okay from a cardiac standpoint.  She denies chest pain she walks on her treadmill about 2 mph 3 times a week (30 minutes) without a problem.  She works outside in the yard, again without a problem.  The patient is being evaluated by GI she has a large polyp plan is for surgery to remove.  This is for January or February.  Current Meds  Medication Sig  . alendronate (FOSAMAX) 70 MG tablet TAKE ONE TABLET BY MOUTH ONCE WEEKLY ON EMPTY STOMACH WITH FULL GLASS OF WATER  . aspirin 81 MG tablet Take 81 mg by mouth daily.    . Calcium Carbonate-Vit D-Min (CALCIUM 1200 PO) Take 1,200 mg by mouth daily. 1 tab po qd  . Fish Oil OIL 2 tabs po qd   . ibuprofen (ADVIL) 100 MG/5ML suspension Take 200 mg by mouth every 4 (four) hours as needed.  . Multiple Vitamin (MULTIVITAMIN) capsule Take 1 capsule by mouth daily.    . ramipril (ALTACE) 5 MG capsule TAKE 1 CAPSULE(5 MG) BY MOUTH DAILY  . rosuvastatin (CRESTOR) 20 MG tablet Take 0.5 tablets (10 mg total) by mouth daily.     Allergies:   Patient has no known allergies.   Past Medical History:  Diagnosis Date  . Allergy   . Cataract   . Fatty liver   . Goiter   . HTN (hypertension)   . Hyperlipidemia   . Myocardial infarct ([place]) hx of 2004  . Osteoporosis   . Sleep apnea    no cpap  . Wrist fracture     Past Surgical History:  Procedure Laterality Date  . CARDIAC CATHETERIZATION  2004   stent  . sleep study  2012     Social History:   The patient  reports that she quit smoking about 16 years ago. She has a 30.00 pack-year smoking history. She quit smokeless tobacco use about 16 years ago. She reports current alcohol use. She reports that she does not use drugs.   Family History:  The patient's family history includes Arthritis in her brother; Cancer (age of onset: 55) in her sister; Cancer (age of onset: 40) in her father; Coronary artery disease in an other family member; Diabetes in her maternal grandmother; Heart disease in her father; Heart disease (age of onset: 63) in her mother; Hypertension in her brother, father, mother, and sister; Lung cancer in an other family member; Ovarian cancer in her mother and another family member.    ROS:  Please see the history of present illness. All other systems are reviewed and  Negative to the above problem except as noted.    PHYSICAL EXAM: VS:  BP 134/74   Pulse 78   Temp (!) 97.5 F (36.4 C)   Ht 5\' 5"  (1.651 m)   Wt 195 lb (88.5 kg)   BMI 32.45 kg/m   GEN: Obese 73 year old, in no acute distress  HEENT: normal  Neck: no JVD, carotid bruits Cardiac: RRR; no murmurs, rubs, or gallops,no edema  Respiratory:  clear to auscultation bilaterally, normal work of breathing GI: soft, nontender, nondistended, + BS  No hepatomegaly  MS: no deformity Moving all extremities   Skin: warm and dry, no rash Neuro:  Strength and sensation are intact Psych: euthymic mood, full affect   EKG:  EKG is ordered today.  Sinus bradycardia 58 bpm.  Nonspecific ST changes.  Low voltage.   Lipid Panel    Component Value Date/Time   CHOL 184 04/06/2019 1517   TRIG 73.0 04/06/2019 1517   HDL 80.10 04/06/2019 1517   CHOLHDL 2 04/06/2019 1517   VLDL 14.6 04/06/2019 1517   LDLCALC 89 04/06/2019 1517   LDLDIRECT 123.7 05/30/2013 0811      Wt Readings from Last 3 Encounters:  05/24/19 195 lb (88.5 kg)  04/10/19 195 lb (88.5 kg)  04/06/19 195 lb 6.4 oz (88.6 kg)      ASSESSMENT AND  PLAN:  1.  CAD patient with remote intervention.  Doing well.  No symptoms of angina.  No symptoms of CHF.  From a cardiac standpoint I think she should be okay to proceed with surgery.  She is at low risk for major cardiac event.  I would keep her on same regimen.  We will follow up in 1 year.  2.  Dyslipidemia.  Last lipid panel in November LDL was 89, HDL 80, triglycerides 73.  She admits to not eating as well.  Her LDL several years ago was in the low 70s.  I would encourage her to make changes in her diet and exercise before making recommendations to increase statin  We will plan to see the patient back in 1 years time.  Encouraged her to stay active.  Current medicines are reviewed at length with the patient today.  The patient does not have concerns regarding medicines.  Signed, Dorris Carnes, MD  05/24/2019 1:27 PM    [facility] [address], [place], [place]  [address] Phone: [phone]; Fax: [phone]

## 2019-06-26 ENCOUNTER — Ambulatory Visit: Payer: Medicare Other | Admitting: Internal Medicine

## 2019-06-28 ENCOUNTER — Other Ambulatory Visit: Payer: Self-pay | Admitting: Family Medicine

## 2019-07-07 NOTE — Patient Instructions (Signed)
DUE TO COVID-19 ONLY ONE VISITOR IS ALLOWED TO COME WITH YOU AND STAY IN THE WAITING ROOM ONLY DURING PRE OP AND PROCEDURE DAY OF SURGERY. THE 1 VISITOR MAY VISIT WITH YOU AFTER SURGERY IN YOUR PRIVATE ROOM DURING VISITING HOURS ONLY!  YOU NEED TO HAVE A COVID 19 TEST ON_2/6/21______ @_11 :05______, THIS TEST MUST BE DONE BEFORE SURGERY, COME  Tina [name] , [phone].  ([place]) ONCE YOUR COVID TEST IS COMPLETED, PLEASE BEGIN THE QUARANTINE INSTRUCTIONS AS OUTLINED IN YOUR HANDOUT.                Tina [name]     Your procedure is scheduled on: 07/12/19   Report to [facility] Main  Entrance   Report to admitting at   10:30 AM     Call this number if you have problems the morning of surgery [phone]   Follow all instructions for bowel prep from Dr. Clyda Greener office   BRUSH YOUR TEETH MORNING OF SURGERY AND RINSE YOUR MOUTH OUT, NO CHEWING GUM CANDY OR MINTS.  DRINK PLENTY OF FLUIDS ON THE DAY OF YOUR PREP.   DRINK 2 PRESURGERY ENSURE DRINKS THE NIGHT BEFORE SURGERY AT 10:00 PM.  You may have clear liquids until 9:30 AM    CLEAR LIQUID DIET   Foods Allowed                                                                     Foods Excluded  Coffee and tea, regular and decaf                             liquids that you cannot  Plain Jell-O any favor except red or purple                                           see through such as: Fruit ices (not with fruit pulp)                                     milk, soups, orange juice  Iced Popsicles                                    All solid food Carbonated beverages, regular and diet                                    Cranberry, grape and apple juices Sports drinks like Gatorade Lightly seasoned clear broth or consume(fat free) Sugar, honey syrup    PLEASE FINISH PRESURGERY ENSURE DRINK PER SURGEON ORDER  WHICH NEEDS TO BE COMPLETED AT 9:30 AM   Take these medicines the morning of  surgery with A SIP OF WATER: Fosamax  You may not have any metal on your body including hair pins and              piercings  Do not wear jewelry, make-up, lotions, powders or perfumes, deodorant             Do not wear nail polish on your fingernails.  Do not shave  48 hours prior to surgery.           Do not bring valuables to the hospital. [place].  Contacts, dentures or bridgework may not be worn into surgery.      Special Instructions: N/A              Please read over the following fact sheets you were given: _____________________________________________________________________             [facility] - Preparing for Surgery  Before surgery, you can play an important role.   Because skin is not sterile, your skin needs to be as free of germs as possible.   You can reduce the number of germs on your skin by washing with CHG (chlorahexidine gluconate) soap before surgery.   CHG is an antiseptic cleaner which kills germs and bonds with the skin to continue killing germs even after washing. Please DO NOT use if you have an allergy to CHG or antibacterial soaps.  If your skin becomes  reddened/irritated stop using the CHG and inform your nurse when you arrive at Short Stay. .  You may shave your face/neck.  Please follow these instructions carefully:  1.  Shower with CHG Soap the night before surgery and the  morning of Surgery.  2.  If you choose to wash your hair, wash your hair first as usual with your  normal  shampoo.  3.  After you shampoo, rinse your hair and body thoroughly to remove the  shampoo.                                        4.  Use CHG as you would any other liquid soap.  You can apply chg directly  to the skin and wash                       Gently with a scrungie or clean washcloth.  5.  Apply the CHG Soap to your body ONLY FROM THE NECK DOWN.   Do not use on face/ open                            Wound or open sores. Avoid contact with eyes, ears mouth and genitals (private parts).                       Wash face,  Genitals (private parts) with your normal soap.             6.  Wash thoroughly, paying special attention to the area where your surgery  will be performed.  7.  Thoroughly rinse your body with warm water from the neck down.  8.  DO NOT shower/wash with your normal soap after using and rinsing off  the CHG Soap.  9.  Pat yourself dry with a clean towel.            10.  Wear clean pajamas.            11.  Place clean sheets on your bed the night of your first shower and do not  sleep with pets. Day of Surgery : Do not apply any lotions/deodorants the morning of surgery.  Please wear clean clothes to the hospital/surgery center.  FAILURE TO FOLLOW THESE INSTRUCTIONS MAY RESULT IN THE CANCELLATION OF YOUR SURGERY PATIENT SIGNATURE_________________________________  NURSE SIGNATURE__________________________________  ________________________________________________________________________   Tina [name]  An incentive spirometer is a tool that can help keep your lungs clear and active. This tool measures how well you are filling your lungs with each breath. Taking long deep breaths may help reverse or decrease the chance of developing breathing (pulmonary) problems (especially infection) following:  A long period of time when you are unable to move or be active. BEFORE THE PROCEDURE   If the spirometer includes an indicator to show your best effort, your nurse or respiratory therapist will set it to a desired goal.  If possible, sit up straight or lean slightly forward. Try not to slouch.  Hold the incentive spirometer in an upright position. INSTRUCTIONS FOR USE  1. Sit on the edge of your bed if possible, or sit up as far as you can in bed or on a chair. 2. Hold the incentive spirometer in an upright position. 3. Breathe out  normally. 4. Place the mouthpiece in your mouth and seal your lips tightly around it. 5. Breathe in slowly and as deeply as possible, raising the piston or the ball toward the top of the column. 6. Hold your breath for 3-5 seconds or for as long as possible. Allow the piston or ball to fall to the bottom of the column. 7. Remove the mouthpiece from your mouth and breathe out normally. 8. Rest for a few seconds and repeat Steps 1 through 7 at least 10 times every 1-2 hours when you are awake. Take your time and take a few normal breaths between deep breaths. 9. The spirometer may include an indicator to show your best effort. Use the indicator as a goal to work toward during each repetition. 10. After each set of 10 deep breaths, practice coughing to be sure your lungs are clear. If you have an incision (the cut made at the time of surgery), support your incision when coughing by placing a pillow or rolled up towels firmly against it. Once you are able to get out of bed, walk around indoors and cough well. You may stop using the incentive spirometer when instructed by your caregiver.  RISKS AND COMPLICATIONS  Take your time so you do not get dizzy or light-headed.  If you are in pain, you may need to take or ask for pain medication before doing incentive spirometry. It is harder to take a deep breath if you are having pain. AFTER USE  Rest and breathe slowly and easily.  It can be helpful to keep track of a log of your progress. Your caregiver can provide you with a simple table to help with this. If you are using the spirometer at home, follow these instructions: [place] IF:   You are having difficultly using the spirometer.  You have trouble using the spirometer as often as instructed.  Your pain medication is not giving enough relief while using the spirometer.  You develop  fever of 100.5 F (38.1 C) or higher. SEEK IMMEDIATE MEDICAL CARE IF:   You cough up bloody sputum  that had not been present before.  You develop fever of 102 F (38.9 C) or greater.  You develop worsening pain at or near the incision site. MAKE SURE YOU:   Understand these instructions.  Will watch your condition.  Will get help right away if you are not doing well or get worse. Document Released: 09/28/2006 Document Revised: 08/10/2011 Document Reviewed: 11/29/2006 [employer] Patient Information 2014 [place].   ________________________________________________________________________   Incentive Spirometer  An incentive spirometer is a tool that can help keep your lungs clear and active. This tool measures how well you are filling your lungs with each breath. Taking long deep breaths may help reverse or decrease the chance of developing breathing (pulmonary) problems (especially infection) following:  A long period of time when you are unable to move or be active. BEFORE THE PROCEDURE   If the spirometer includes an indicator to show your best effort, your nurse or respiratory therapist will set it to a desired goal.  If possible, sit up straight or lean slightly forward. Try not to slouch.  Hold the incentive spirometer in an upright position. INSTRUCTIONS FOR USE  11. Sit on the edge of your bed if possible, or sit up as far as you can in bed or on a chair. 12. Hold the incentive spirometer in an upright position. 13. Breathe out normally. 14. Place the mouthpiece in your mouth and seal your lips tightly around it. 15. Breathe in slowly and as deeply as possible, raising the piston or the ball toward the top of the column. 16. Hold your breath for 3-5 seconds or for as long as possible. Allow the piston or ball to fall to the bottom of the column. 17. Remove the mouthpiece from your mouth and breathe out normally. 18. Rest for a few seconds and repeat Steps 1 through 7 at least 10 times every 1-2 hours when you are awake. Take your time and take a few normal  breaths between deep breaths. 19. The spirometer may include an indicator to show your best effort. Use the indicator as a goal to work toward during each repetition. 20. After each set of 10 deep breaths, practice coughing to be sure your lungs are clear. If you have an incision (the cut made at the time of surgery), support your incision when coughing by placing a pillow or rolled up towels firmly against it. Once you are able to get out of bed, walk around indoors and cough well. You may stop using the incentive spirometer when instructed by your caregiver.  RISKS AND COMPLICATIONS  Take your time so you do not get dizzy or light-headed.  If you are in pain, you may need to take or ask for pain medication before doing incentive spirometry. It is harder to take a deep breath if you are having pain. AFTER USE  Rest and breathe slowly and easily.  It can be helpful to keep track of a log of your progress. Your caregiver can provide you with a simple table to help with this. If you are using the spirometer at home, follow these instructions: [place] IF:   You are having difficultly using the spirometer.  You have trouble using the spirometer as often as instructed.  Your pain medication is not giving enough relief while using the spirometer.  You develop fever of 100.5 F (38.1  C) or higher. SEEK IMMEDIATE MEDICAL CARE IF:   You cough up bloody sputum that had not been present before.  You develop fever of 102 F (38.9 C) or greater.  You develop worsening pain at or near the incision site. MAKE SURE YOU:   Understand these instructions.  Will watch your condition.  Will get help right away if you are not doing well or get worse. Document Released: 09/28/2006 Document Revised: 08/10/2011 Document Reviewed: 11/29/2006 [facility] Patient Information 2014 [place], [place].   ________________________________________________________________________

## 2019-07-08 ENCOUNTER — Other Ambulatory Visit (HOSPITAL_COMMUNITY)
Admission: RE | Admit: 2019-07-08 | Discharge: 2019-07-08 | Disposition: A | Discharge status: Home / Self Care | Payer: Medicare Other | Source: Ambulatory Visit | Attending: Surgery | Admitting: Surgery

## 2019-07-08 DIAGNOSIS — Z20822 Contact with and (suspected) exposure to covid-19: Secondary | ICD-10-CM | POA: Diagnosis not present

## 2019-07-08 DIAGNOSIS — Z01812 Encounter for preprocedural laboratory examination: Secondary | ICD-10-CM | POA: Diagnosis not present

## 2019-07-08 LAB — SARS CORONAVIRUS 2 (TAT 6-24 HRS): SARS Coronavirus 2: NEGATIVE

## 2019-07-10 ENCOUNTER — Encounter (HOSPITAL_COMMUNITY)
Admission: RE | Admit: 2019-07-10 | Discharge: 2019-07-10 | Disposition: A | Discharge status: Home / Self Care | Payer: Medicare Other | Source: Ambulatory Visit | Attending: Surgery | Admitting: Surgery

## 2019-07-10 ENCOUNTER — Encounter (HOSPITAL_COMMUNITY): Payer: Self-pay

## 2019-07-10 ENCOUNTER — Other Ambulatory Visit: Payer: Self-pay

## 2019-07-10 DIAGNOSIS — Z79899 Other long term (current) drug therapy: Secondary | ICD-10-CM | POA: Insufficient documentation

## 2019-07-10 DIAGNOSIS — I2510 Atherosclerotic heart disease of native coronary artery without angina pectoris: Secondary | ICD-10-CM | POA: Insufficient documentation

## 2019-07-10 DIAGNOSIS — I10 Essential (primary) hypertension: Secondary | ICD-10-CM | POA: Insufficient documentation

## 2019-07-10 DIAGNOSIS — Z20822 Contact with and (suspected) exposure to covid-19: Secondary | ICD-10-CM | POA: Diagnosis not present

## 2019-07-10 DIAGNOSIS — Z01812 Encounter for preprocedural laboratory examination: Secondary | ICD-10-CM | POA: Insufficient documentation

## 2019-07-10 DIAGNOSIS — E7800 Pure hypercholesterolemia, unspecified: Secondary | ICD-10-CM | POA: Diagnosis not present

## 2019-07-10 DIAGNOSIS — R739 Hyperglycemia, unspecified: Secondary | ICD-10-CM | POA: Diagnosis not present

## 2019-07-10 DIAGNOSIS — I252 Old myocardial infarction: Secondary | ICD-10-CM | POA: Insufficient documentation

## 2019-07-10 DIAGNOSIS — E785 Hyperlipidemia, unspecified: Secondary | ICD-10-CM | POA: Insufficient documentation

## 2019-07-10 DIAGNOSIS — K621 Rectal polyp: Secondary | ICD-10-CM | POA: Insufficient documentation

## 2019-07-10 DIAGNOSIS — Z87891 Personal history of nicotine dependence: Secondary | ICD-10-CM | POA: Insufficient documentation

## 2019-07-10 DIAGNOSIS — Z7982 Long term (current) use of aspirin: Secondary | ICD-10-CM | POA: Insufficient documentation

## 2019-07-10 DIAGNOSIS — K219 Gastro-esophageal reflux disease without esophagitis: Secondary | ICD-10-CM | POA: Diagnosis not present

## 2019-07-10 DIAGNOSIS — M810 Age-related osteoporosis without current pathological fracture: Secondary | ICD-10-CM | POA: Insufficient documentation

## 2019-07-10 DIAGNOSIS — K760 Fatty (change of) liver, not elsewhere classified: Secondary | ICD-10-CM | POA: Insufficient documentation

## 2019-07-10 LAB — BASIC METABOLIC PANEL
Anion gap: 10 (ref 5–15)
BUN: 15 mg/dL (ref 8–23)
CO2: 26 mmol/L (ref 22–32)
Calcium: 10.1 mg/dL (ref 8.9–10.3)
Chloride: 99 mmol/L (ref 98–111)
Creatinine, Ser: 0.71 mg/dL (ref 0.44–1.00)
GFR calc Af Amer: >60 mL/min (ref >60)
GFR calc non Af Amer: >60 mL/min (ref >60)
Glucose, Bld: 99 mg/dL (ref 70–99)
Potassium: 3.9 mmol/L (ref 3.5–5.1)
Sodium: 135 mmol/L (ref 135–145)

## 2019-07-10 LAB — CBC
HCT: 42.2 % (ref 36.0–46.0)
Hemoglobin: 14.3 g/dL (ref 12.0–15.0)
MCH: 31.3 pg (ref 26.0–34.0)
MCHC: 33.9 g/dL (ref 30.0–36.0)
MCV: 92.3 fL (ref 80.0–100.0)
Platelets: 236 10*3/uL (ref 150–400)
RBC: 4.57 MIL/uL (ref 3.87–5.11)
RDW: 12.9 % (ref 11.5–15.5)
WBC: 7.2 10*3/uL (ref 4.0–10.5)
nRBC: 0 % (ref 0.0–0.2)

## 2019-07-10 LAB — SURGICAL PCR SCREEN
MRSA, PCR: NEGATIVE
Staphylococcus aureus: NEGATIVE

## 2019-07-10 NOTE — Consult Note (Signed)
[place] Nurse requested for preoperative stoma site marking  Discussed surgical procedure and stoma creation with patient and family.  Explained role of the [place] nurse team.  Provided the patient with educational booklet and provided samples of pouching options.  Answered patient and family questions.   Examined patient lying, sitting, and standing in order to place the marking in the patient's visual field, away from any creases or abdominal contour issues and within the rectus muscle.  Patient has extensive creasing at the umbilicus.  She wears her pants at this time.  She cannot see skin below umbilicus due to skin fold above this level.  High marking necessary for optimal pouching.   Marked for colostomy in the LLQ  6  cm to the left of the umbilicus and 6 cm above the umbilicus.  Marked for ileostomy in the RLQ  6 cm to the right of the umbilicus and  6 cm above the umbilicus.  Patient's abdomen cleansed with CHG wipes at site markings, allowed to air dry prior to marking.Covered mark with thin film transparent dressing to preserve mark until date of surgery.   [place] Nurse team will follow up with patient after surgery for continue ostomy care and teaching.  [place] team will follow.  Domenic Moras MSN, RN, FNP-BC CWON Wound, Ostomy, Continence Nurse Pager [phone]

## 2019-07-10 NOTE — Progress Notes (Signed)
PCP - Dr. Carollee Herter Cardiologist - Dr. Lizbeth Bark  Chest x-ray - no 2010 EKG - 05/24/19 Stress Test - no ECHO - 2010 Cardiac Cath - 2004  Sleep Study - yes- mild CPAP - no  Fasting Blood Sugar - NA Checks Blood Sugar _____ times a day  Blood Thinner Instructions:ASA Aspirin Instructions:continue taking it per Dr. Clyda Greener office Last Dose:NA  Anesthesia review:   Patient denies shortness of breath, fever, cough and chest pain at PAT appointment yes  Patient verbalized understanding of instructions that were given to them at the PAT appointment. Patient was also instructed that they will need to review over the PAT instructions again at home before surgery. yes

## 2019-07-10 NOTE — Progress Notes (Signed)
Anesthesia Chart Review   Case: [id number] Date/Time: 07/12/19 1215   Procedures:      XI ROBOT ASSISTED RESECTION OF RECTOSIGMOID COLON, POSSIBLE OSTOMY (N/A )     RIGID PROCTOSCOPY (N/A )   Anesthesia type: General   Pre-op diagnosis: PROXIMAL RECTAL POLYP   Location: [facility] ROOM 02 / [facility]   Surgeons: Michael Boston, MD      DISCUSSION:74 y.o. former smoker (30 pack years, quit 12/13/02) with h/o HLD, HTN, CADs/p NSTEMI in 2004 with PTCA/stent to LAD, sleep apnea, proximal rectal polyp scheduled for above procedure 07/12/19 with Dr. Michael Boston.   Pt last seen by cardiologist, Dr. Dorris Carnes, 05/24/2019.  Per OV note,  "CAD patient with remote intervention.  Doing well.  No symptoms of angina.  No symptoms of CHF. From a cardiac standpoint I think she should be okay to proceed with surgery.  She is at low risk for major cardiac event.  I would keep her on same regimen.  We will follow up in 1 year."  Anticipate pt can proceed with planned procedure barring acute status change.   VS: BP (!) 156/68 (BP Location: Right Arm)   Pulse (!) 53   Temp 36.7 C (Oral)   Resp 18   Ht 5\' 5"  (1.651 m)   Wt 84.4 kg   SpO2 100%   BMI 30.97 kg/m   PROVIDERS: Ann Held, DO is PCP   Dorris Carnes, MD is Cardiologist  LABS: Labs reviewed: Acceptable for surgery. (all labs ordered are listed, but only abnormal results are displayed)  Labs Reviewed  SURGICAL PCR SCREEN  BASIC METABOLIC PANEL  CBC  HEMOGLOBIN A1C     IMAGES:   EKG: 05/24/2019 Rate 58 bpm Sinus bradycardia   CV:  Past Medical History:  Diagnosis Date  . Allergy   . Cataract   . Fatty liver   . Goiter   . HTN (hypertension)   . Hyperlipidemia   . Myocardial infarct ([place]) hx of 2004  . Osteoporosis   . Sleep apnea    no cpap  . Wrist fracture     Past Surgical History:  Procedure Laterality Date  . CARDIAC CATHETERIZATION  2004   stent  . sleep study  2012    MEDICATIONS: . alendronate  (FOSAMAX) 70 MG tablet  . aspirin 81 MG tablet  . Calcium Carbonate-Vit D-Min (CALCIUM 1200 PO)  . Multiple Vitamin (MULTIVITAMIN) capsule  . Omega-3 Fatty Acids (FISH OIL) 1000 MG CAPS  . ramipril (ALTACE) 5 MG capsule  . rosuvastatin (CRESTOR) 20 MG tablet   No current facility-administered medications for this encounter.     Maia Plan [facility] Pre-Surgical Testing [phone] 07/10/19  4:33 PM

## 2019-07-11 LAB — HEMOGLOBIN A1C
Hgb A1c MFr Bld: 5.4 % (ref 4.8–5.6)
Mean Plasma Glucose: 108 mg/dL

## 2019-07-11 MED ORDER — BUPIVACAINE LIPOSOME 1.3 % IJ SUSP
20.0000 mL | Freq: Once | INTRAMUSCULAR | Status: DC
  Filled 2019-07-11: qty 20

## 2019-07-11 MED ORDER — SODIUM CHLORIDE 0.9 % IV SOLN
INTRAVENOUS | Status: DC
  Filled 2019-07-11: qty 6

## 2019-07-12 ENCOUNTER — Inpatient Hospital Stay (HOSPITAL_COMMUNITY)
Admission: RE | Admit: 2019-07-12 | Discharge: 2019-07-14 | DRG: 331 | Disposition: A | Discharge status: Home / Self Care | Payer: Medicare Other | Attending: Surgery | Admitting: Surgery

## 2019-07-12 ENCOUNTER — Encounter (HOSPITAL_COMMUNITY): Payer: Self-pay | Admitting: Surgery

## 2019-07-12 ENCOUNTER — Encounter (HOSPITAL_COMMUNITY)
Admission: RE | Disposition: A | Discharge status: Home / Self Care | Payer: Self-pay | Source: Home / Self Care | Attending: Surgery

## 2019-07-12 ENCOUNTER — Inpatient Hospital Stay (HOSPITAL_COMMUNITY): Payer: Medicare Other | Admitting: Certified Registered"

## 2019-07-12 ENCOUNTER — Other Ambulatory Visit: Payer: Self-pay

## 2019-07-12 ENCOUNTER — Inpatient Hospital Stay (HOSPITAL_COMMUNITY): Payer: Medicare Other | Admitting: Physician Assistant

## 2019-07-12 DIAGNOSIS — D128 Benign neoplasm of rectum: Secondary | ICD-10-CM | POA: Diagnosis present

## 2019-07-12 DIAGNOSIS — Z6831 Body mass index (BMI) 31.0-31.9, adult: Secondary | ICD-10-CM

## 2019-07-12 DIAGNOSIS — D375 Neoplasm of uncertain behavior of rectum: Secondary | ICD-10-CM | POA: Diagnosis not present

## 2019-07-12 DIAGNOSIS — K621 Rectal polyp: Principal | ICD-10-CM | POA: Diagnosis present

## 2019-07-12 DIAGNOSIS — E669 Obesity, unspecified: Secondary | ICD-10-CM | POA: Diagnosis present

## 2019-07-12 DIAGNOSIS — Z79899 Other long term (current) drug therapy: Secondary | ICD-10-CM | POA: Diagnosis not present

## 2019-07-12 DIAGNOSIS — I252 Old myocardial infarction: Secondary | ICD-10-CM

## 2019-07-12 DIAGNOSIS — E785 Hyperlipidemia, unspecified: Secondary | ICD-10-CM | POA: Diagnosis not present

## 2019-07-12 DIAGNOSIS — E7800 Pure hypercholesterolemia, unspecified: Secondary | ICD-10-CM | POA: Diagnosis present

## 2019-07-12 DIAGNOSIS — Z20822 Contact with and (suspected) exposure to covid-19: Secondary | ICD-10-CM | POA: Diagnosis not present

## 2019-07-12 DIAGNOSIS — Z7982 Long term (current) use of aspirin: Secondary | ICD-10-CM

## 2019-07-12 DIAGNOSIS — D127 Benign neoplasm of rectosigmoid junction: Secondary | ICD-10-CM | POA: Diagnosis not present

## 2019-07-12 DIAGNOSIS — I10 Essential (primary) hypertension: Secondary | ICD-10-CM | POA: Diagnosis present

## 2019-07-12 DIAGNOSIS — R739 Hyperglycemia, unspecified: Secondary | ICD-10-CM | POA: Diagnosis not present

## 2019-07-12 DIAGNOSIS — Z87891 Personal history of nicotine dependence: Secondary | ICD-10-CM

## 2019-07-12 DIAGNOSIS — K219 Gastro-esophageal reflux disease without esophagitis: Secondary | ICD-10-CM | POA: Diagnosis present

## 2019-07-12 HISTORY — PX: PROCTOSCOPY: SHX2266

## 2019-07-12 SURGERY — COLECTOMY, PARTIAL, ROBOT-ASSISTED, LAPAROSCOPIC
Anesthesia: General | Site: Abdomen

## 2019-07-12 MED ORDER — PROPOFOL 10 MG/ML IV BOLUS
INTRAVENOUS | Status: AC
  Filled 2019-07-12: qty 20

## 2019-07-12 MED ORDER — PHENYLEPHRINE 40 MCG/ML (10ML) SYRINGE FOR IV PUSH (FOR BLOOD PRESSURE SUPPORT)
PREFILLED_SYRINGE | INTRAVENOUS | Status: AC
  Filled 2019-07-12: qty 10

## 2019-07-12 MED ORDER — ROCURONIUM BROMIDE 10 MG/ML (PF) SYRINGE
PREFILLED_SYRINGE | INTRAVENOUS | Status: DC | PRN
  Administered 2019-07-12: 30 mg via INTRAVENOUS
  Administered 2019-07-12: 40 mg via INTRAVENOUS
  Administered 2019-07-12: 10 mg via INTRAVENOUS

## 2019-07-12 MED ORDER — SODIUM CHLORIDE 0.9 % IV SOLN
2.0000 g | INTRAVENOUS | Status: AC
  Administered 2019-07-12: 2 g via INTRAVENOUS
  Filled 2019-07-12: qty 2

## 2019-07-12 MED ORDER — POLYETHYLENE GLYCOL 3350 17 GM/SCOOP PO POWD: 1.0000 | Freq: Once | ORAL | Status: DC

## 2019-07-12 MED ORDER — BUPIVACAINE HCL 0.25 % IJ SOLN
INTRAMUSCULAR | Status: AC
  Filled 2019-07-12: qty 1

## 2019-07-12 MED ORDER — FENTANYL CITRATE (PF) 100 MCG/2ML IJ SOLN: 25.0000 ug | INTRAMUSCULAR | Status: DC | PRN

## 2019-07-12 MED ORDER — ONDANSETRON HCL 4 MG/2ML IJ SOLN: 4.0000 mg | Freq: Once | INTRAMUSCULAR | Status: DC | PRN

## 2019-07-12 MED ORDER — ONDANSETRON HCL 4 MG PO TABS: 4.0000 mg | ORAL_TABLET | Freq: Four times a day (QID) | ORAL | Status: DC | PRN

## 2019-07-12 MED ORDER — MIDAZOLAM HCL 2 MG/2ML IJ SOLN
INTRAMUSCULAR | Status: AC
  Filled 2019-07-12: qty 2

## 2019-07-12 MED ORDER — FENTANYL CITRATE (PF) 100 MCG/2ML IJ SOLN
INTRAMUSCULAR | Status: DC | PRN
  Administered 2019-07-12 (×2): 50 ug via INTRAVENOUS

## 2019-07-12 MED ORDER — BISACODYL 5 MG PO TBEC
20.0000 mg | DELAYED_RELEASE_TABLET | Freq: Once | ORAL | Status: DC
  Filled 2019-07-12: qty 4

## 2019-07-12 MED ORDER — SODIUM CHLORIDE 0.9% FLUSH
3.0000 mL | Freq: Two times a day (BID) | INTRAVENOUS | Status: DC
  Administered 2019-07-13: 22:00:00 3 mL via INTRAVENOUS

## 2019-07-12 MED ORDER — LACTATED RINGERS IV SOLN: INTRAVENOUS | Status: DC

## 2019-07-12 MED ORDER — DEXAMETHASONE SODIUM PHOSPHATE 10 MG/ML IJ SOLN
INTRAMUSCULAR | Status: DC | PRN
  Administered 2019-07-12: 8 mg via INTRAVENOUS

## 2019-07-12 MED ORDER — RAMIPRIL 5 MG PO CAPS
5.0000 mg | ORAL_CAPSULE | Freq: Every day | ORAL | Status: DC
  Administered 2019-07-12 – 2019-07-14 (×3): 5 mg via ORAL
  Filled 2019-07-12 (×3): qty 1

## 2019-07-12 MED ORDER — LIDOCAINE HCL 2 % IJ SOLN
INTRAMUSCULAR | Status: AC
  Filled 2019-07-12: qty 20

## 2019-07-12 MED ORDER — LACTATED RINGERS IR SOLN
Status: DC | PRN
  Administered 2019-07-12: 2000 mL

## 2019-07-12 MED ORDER — ONDANSETRON HCL 4 MG/2ML IJ SOLN: 4.0000 mg | Freq: Four times a day (QID) | INTRAMUSCULAR | Status: DC | PRN

## 2019-07-12 MED ORDER — SODIUM CHLORIDE 0.9 % IV SOLN: 250.0000 mL | INTRAVENOUS | Status: DC | PRN

## 2019-07-12 MED ORDER — ENOXAPARIN SODIUM 40 MG/0.4ML ~~LOC~~ SOLN
40.0000 mg | SUBCUTANEOUS | Status: DC
  Administered 2019-07-13 – 2019-07-14 (×2): 40 mg via SUBCUTANEOUS
  Filled 2019-07-12 (×2): qty 0.4

## 2019-07-12 MED ORDER — SODIUM CHLORIDE 0.9 % IV SOLN: Freq: Three times a day (TID) | INTRAVENOUS | Status: DC | PRN

## 2019-07-12 MED ORDER — ROSUVASTATIN CALCIUM 10 MG PO TABS
10.0000 mg | ORAL_TABLET | Freq: Every day | ORAL | Status: DC
  Administered 2019-07-13 – 2019-07-14 (×2): 10 mg via ORAL
  Filled 2019-07-12 (×2): qty 1

## 2019-07-12 MED ORDER — ASPIRIN EC 81 MG PO TBEC
81.0000 mg | DELAYED_RELEASE_TABLET | Freq: Every day | ORAL | Status: DC
  Administered 2019-07-13 – 2019-07-14 (×2): 81 mg via ORAL
  Filled 2019-07-12 (×2): qty 1

## 2019-07-12 MED ORDER — BUPIVACAINE HCL (PF) 0.25 % IJ SOLN
INTRAMUSCULAR | Status: DC | PRN
  Administered 2019-07-12: 50 mL

## 2019-07-12 MED ORDER — LIP MEDEX EX OINT
1.0000 "application " | TOPICAL_OINTMENT | Freq: Two times a day (BID) | CUTANEOUS | Status: DC
  Administered 2019-07-12 – 2019-07-13 (×3): 1 via TOPICAL
  Filled 2019-07-12: qty 7

## 2019-07-12 MED ORDER — INDOCYANINE GREEN 25 MG IV SOLR
INTRAVENOUS | Status: DC | PRN
  Administered 2019-07-12: 7.5 mg via INTRAVENOUS

## 2019-07-12 MED ORDER — SODIUM CHLORIDE 0.9 % IV SOLN
2.0000 g | Freq: Two times a day (BID) | INTRAVENOUS | Status: AC
  Administered 2019-07-13: 2 g via INTRAVENOUS
  Filled 2019-07-12: qty 2

## 2019-07-12 MED ORDER — PROCHLORPERAZINE EDISYLATE 10 MG/2ML IJ SOLN: 5.0000 mg | Freq: Four times a day (QID) | INTRAMUSCULAR | Status: DC | PRN

## 2019-07-12 MED ORDER — BUPIVACAINE LIPOSOME 1.3 % IJ SUSP
INTRAMUSCULAR | Status: DC | PRN
  Administered 2019-07-12: 20 mL

## 2019-07-12 MED ORDER — ADULT MULTIVITAMIN W/MINERALS CH
ORAL_TABLET | Freq: Every day | ORAL | Status: DC
  Administered 2019-07-13 – 2019-07-14 (×2): 1 via ORAL
  Filled 2019-07-12 (×2): qty 1

## 2019-07-12 MED ORDER — 0.9 % SODIUM CHLORIDE (POUR BTL) OPTIME
TOPICAL | Status: DC | PRN
  Administered 2019-07-12: 2000 mL

## 2019-07-12 MED ORDER — PHENYLEPHRINE 40 MCG/ML (10ML) SYRINGE FOR IV PUSH (FOR BLOOD PRESSURE SUPPORT)
PREFILLED_SYRINGE | INTRAVENOUS | Status: DC | PRN
  Administered 2019-07-12 (×6): 120 ug via INTRAVENOUS
  Administered 2019-07-12: 80 ug via INTRAVENOUS

## 2019-07-12 MED ORDER — SODIUM CHLORIDE 0.9% FLUSH: 3.0000 mL | INTRAVENOUS | Status: DC | PRN

## 2019-07-12 MED ORDER — DIPHENHYDRAMINE HCL 50 MG/ML IJ SOLN: 12.5000 mg | Freq: Four times a day (QID) | INTRAMUSCULAR | Status: DC | PRN

## 2019-07-12 MED ORDER — SUCCINYLCHOLINE CHLORIDE 200 MG/10ML IV SOSY
PREFILLED_SYRINGE | INTRAVENOUS | Status: AC
  Filled 2019-07-12: qty 10

## 2019-07-12 MED ORDER — SUCCINYLCHOLINE CHLORIDE 200 MG/10ML IV SOSY
PREFILLED_SYRINGE | INTRAVENOUS | Status: DC | PRN
  Administered 2019-07-12: 80 mg via INTRAVENOUS

## 2019-07-12 MED ORDER — LIDOCAINE 2% (20 MG/ML) 5 ML SYRINGE
INTRAMUSCULAR | Status: AC
  Filled 2019-07-12: qty 5

## 2019-07-12 MED ORDER — ACETAMINOPHEN 500 MG PO TABS
1000.0000 mg | ORAL_TABLET | ORAL | Status: AC
  Administered 2019-07-12: 11:00:00 1000 mg via ORAL
  Filled 2019-07-12: qty 2

## 2019-07-12 MED ORDER — METOPROLOL TARTRATE 5 MG/5ML IV SOLN: 5.0000 mg | Freq: Four times a day (QID) | INTRAVENOUS | Status: DC | PRN

## 2019-07-12 MED ORDER — METRONIDAZOLE 500 MG PO TABS: 1000.0000 mg | ORAL_TABLET | ORAL | Status: DC

## 2019-07-12 MED ORDER — ALVIMOPAN 12 MG PO CAPS
12.0000 mg | ORAL_CAPSULE | ORAL | Status: AC
  Administered 2019-07-12: 11:00:00 12 mg via ORAL
  Filled 2019-07-12: qty 1

## 2019-07-12 MED ORDER — GABAPENTIN 100 MG PO CAPS
200.0000 mg | ORAL_CAPSULE | Freq: Three times a day (TID) | ORAL | Status: DC
  Administered 2019-07-12 – 2019-07-14 (×5): 200 mg via ORAL
  Filled 2019-07-12 (×5): qty 2

## 2019-07-12 MED ORDER — PROCHLORPERAZINE MALEATE 10 MG PO TABS
10.0000 mg | ORAL_TABLET | Freq: Four times a day (QID) | ORAL | Status: DC | PRN
  Filled 2019-07-12: qty 1

## 2019-07-12 MED ORDER — MAGIC MOUTHWASH
15.0000 mL | Freq: Four times a day (QID) | ORAL | Status: DC | PRN
  Filled 2019-07-12: qty 15

## 2019-07-12 MED ORDER — DIPHENHYDRAMINE HCL 12.5 MG/5ML PO ELIX: 12.5000 mg | ORAL_SOLUTION | Freq: Four times a day (QID) | ORAL | Status: DC | PRN

## 2019-07-12 MED ORDER — SUGAMMADEX SODIUM 200 MG/2ML IV SOLN
INTRAVENOUS | Status: DC | PRN
  Administered 2019-07-12: 200 mg via INTRAVENOUS

## 2019-07-12 MED ORDER — TRAMADOL HCL 50 MG PO TABS: 50.0000 mg | ORAL_TABLET | Freq: Four times a day (QID) | ORAL | 0 refills | Status: DC | PRN

## 2019-07-12 MED ORDER — LIDOCAINE 2% (20 MG/ML) 5 ML SYRINGE
INTRAMUSCULAR | Status: DC | PRN
  Administered 2019-07-12: 40 mg via INTRAVENOUS

## 2019-07-12 MED ORDER — PROPOFOL 10 MG/ML IV BOLUS
INTRAVENOUS | Status: DC | PRN
  Administered 2019-07-12: 150 mg via INTRAVENOUS

## 2019-07-12 MED ORDER — ONDANSETRON HCL 4 MG/2ML IJ SOLN
INTRAMUSCULAR | Status: AC
  Filled 2019-07-12: qty 2

## 2019-07-12 MED ORDER — ENSURE SURGERY PO LIQD
237.0000 mL | Freq: Two times a day (BID) | ORAL | Status: DC
  Administered 2019-07-13: 237 mL via ORAL
  Filled 2019-07-12 (×4): qty 237

## 2019-07-12 MED ORDER — LIDOCAINE 2% (20 MG/ML) 5 ML SYRINGE
INTRAMUSCULAR | Status: DC | PRN
  Administered 2019-07-12: 1.5 mg/kg/h via INTRAVENOUS

## 2019-07-12 MED ORDER — DEXAMETHASONE SODIUM PHOSPHATE 10 MG/ML IJ SOLN
INTRAMUSCULAR | Status: AC
  Filled 2019-07-12: qty 1

## 2019-07-12 MED ORDER — EPHEDRINE SULFATE-NACL 50-0.9 MG/10ML-% IV SOSY
PREFILLED_SYRINGE | INTRAVENOUS | Status: DC | PRN
  Administered 2019-07-12: 10 mg via INTRAVENOUS
  Administered 2019-07-12 (×2): 5 mg via INTRAVENOUS

## 2019-07-12 MED ORDER — ACETAMINOPHEN 500 MG PO TABS
1000.0000 mg | ORAL_TABLET | Freq: Four times a day (QID) | ORAL | Status: DC
  Administered 2019-07-12 – 2019-07-14 (×6): 1000 mg via ORAL
  Filled 2019-07-12 (×6): qty 2

## 2019-07-12 MED ORDER — ROCURONIUM BROMIDE 10 MG/ML (PF) SYRINGE
PREFILLED_SYRINGE | INTRAVENOUS | Status: AC
  Filled 2019-07-12: qty 10

## 2019-07-12 MED ORDER — FENTANYL CITRATE (PF) 100 MCG/2ML IJ SOLN
INTRAMUSCULAR | Status: AC
  Filled 2019-07-12: qty 2

## 2019-07-12 MED ORDER — TRAMADOL HCL 50 MG PO TABS: 50.0000 mg | ORAL_TABLET | Freq: Four times a day (QID) | ORAL | Status: DC | PRN

## 2019-07-12 MED ORDER — MIDAZOLAM HCL 2 MG/2ML IJ SOLN
INTRAMUSCULAR | Status: DC | PRN
  Administered 2019-07-12: 2 mg via INTRAVENOUS

## 2019-07-12 MED ORDER — GABAPENTIN 300 MG PO CAPS
300.0000 mg | ORAL_CAPSULE | ORAL | Status: AC
  Administered 2019-07-12: 11:00:00 300 mg via ORAL
  Filled 2019-07-12: qty 1

## 2019-07-12 MED ORDER — HYDROMORPHONE HCL 1 MG/ML IJ SOLN: 0.5000 mg | INTRAMUSCULAR | Status: DC | PRN

## 2019-07-12 MED ORDER — NEOMYCIN SULFATE 500 MG PO TABS: 1000.0000 mg | ORAL_TABLET | ORAL | Status: DC

## 2019-07-12 MED ORDER — ALVIMOPAN 12 MG PO CAPS
12.0000 mg | ORAL_CAPSULE | Freq: Two times a day (BID) | ORAL | Status: DC
  Administered 2019-07-13 – 2019-07-14 (×3): 12 mg via ORAL
  Filled 2019-07-12 (×3): qty 1

## 2019-07-12 MED ORDER — ENOXAPARIN SODIUM 40 MG/0.4ML ~~LOC~~ SOLN
40.0000 mg | Freq: Once | SUBCUTANEOUS | Status: AC
  Administered 2019-07-12: 11:00:00 40 mg via SUBCUTANEOUS
  Filled 2019-07-12: qty 0.4

## 2019-07-12 MED ORDER — PSYLLIUM 95 % PO PACK
1.0000 | PACK | Freq: Every day | ORAL | Status: DC
  Administered 2019-07-12 – 2019-07-14 (×3): 1 via ORAL
  Filled 2019-07-12 (×3): qty 1

## 2019-07-12 MED ORDER — ALUM & MAG HYDROXIDE-SIMETH 200-200-20 MG/5ML PO SUSP
30.0000 mL | Freq: Four times a day (QID) | ORAL | Status: DC | PRN
  Administered 2019-07-12: 21:00:00 30 mL via ORAL
  Filled 2019-07-12: qty 30

## 2019-07-12 MED ORDER — ONDANSETRON HCL 4 MG/2ML IJ SOLN
INTRAMUSCULAR | Status: DC | PRN
  Administered 2019-07-12: 4 mg via INTRAVENOUS

## 2019-07-12 SURGICAL SUPPLY — 107 items
APPLIER CLIP 5 13 M/L LIGAMAX5 (MISCELLANEOUS)
APPLIER CLIP ROT 10 11.4 M/L (STAPLE)
BLADE EXTENDED COATED 6.5IN (ELECTRODE) IMPLANT
CANNULA REDUC XI 12-8 STAPL (CANNULA) ×1
CANNULA REDUCER 12-8 DVNC XI (CANNULA) ×2 IMPLANT
CATH FOLEY 2WAY SLVR  5CC 30FR (CATHETERS) ×1
CATH FOLEY 2WAY SLVR 5CC 30FR (CATHETERS) ×2 IMPLANT
CELLS DAT CNTRL 66122 CELL SVR (MISCELLANEOUS) IMPLANT
CLIP APPLIE 5 13 M/L LIGAMAX5 (MISCELLANEOUS) IMPLANT
CLIP APPLIE ROT 10 11.4 M/L (STAPLE) IMPLANT
CLIP VESOLOCK LG 6/CT PURPLE (CLIP) IMPLANT
CLIP VESOLOCK MED LG 6/CT (CLIP) IMPLANT
COVER SURGICAL LIGHT HANDLE (MISCELLANEOUS) ×6 IMPLANT
COVER TIP SHEARS 8 DVNC (MISCELLANEOUS) ×2 IMPLANT
COVER TIP SHEARS 8MM DA VINCI (MISCELLANEOUS) ×1
COVER WAND RF STERILE (DRAPES) IMPLANT
DECANTER SPIKE VIAL GLASS SM (MISCELLANEOUS) ×3 IMPLANT
DEVICE TROCAR PUNCTURE CLOSURE (ENDOMECHANICALS) IMPLANT
DRAIN CHANNEL 19F RND (DRAIN) IMPLANT
DRAPE ARM DVNC X/XI (DISPOSABLE) ×8 IMPLANT
DRAPE COLUMN DVNC XI (DISPOSABLE) ×2 IMPLANT
DRAPE CV SPLIT W-CLR ANES SCRN (DRAPES) ×3 IMPLANT
DRAPE DA VINCI XI ARM (DISPOSABLE) ×4
DRAPE DA VINCI XI COLUMN (DISPOSABLE) ×1
DRAPE PERI GROIN 82X75IN TIB (DRAPES) ×3 IMPLANT
DRAPE SURG IRRIG POUCH 19X23 (DRAPES) ×3 IMPLANT
DRSG OPSITE POSTOP 4X10 (GAUZE/BANDAGES/DRESSINGS) IMPLANT
DRSG OPSITE POSTOP 4X6 (GAUZE/BANDAGES/DRESSINGS) ×3 IMPLANT
DRSG OPSITE POSTOP 4X8 (GAUZE/BANDAGES/DRESSINGS) IMPLANT
DRSG TEGADERM 2-3/8X2-3/4 SM (GAUZE/BANDAGES/DRESSINGS) ×15 IMPLANT
ELECT REM PT RETURN 15FT ADLT (MISCELLANEOUS) ×3 IMPLANT
ENDOLOOP SUT PDS II  0 18 (SUTURE)
ENDOLOOP SUT PDS II 0 18 (SUTURE) IMPLANT
EVACUATOR SILICONE 100CC (DRAIN) IMPLANT
GAUZE SPONGE 2X2 8PLY STRL LF (GAUZE/BANDAGES/DRESSINGS) ×2 IMPLANT
GAUZE SPONGE 4X4 12PLY STRL (GAUZE/BANDAGES/DRESSINGS) IMPLANT
GLOVE ECLIPSE 8.0 STRL XLNG CF (GLOVE) ×15 IMPLANT
GLOVE INDICATOR 8.0 STRL GRN (GLOVE) ×15 IMPLANT
GOWN STRL REUS W/TWL XL LVL3 (GOWN DISPOSABLE) ×15 IMPLANT
GRASPER SUT TROCAR 14GX15 (MISCELLANEOUS) ×3 IMPLANT
HOLDER FOLEY CATH W/STRAP (MISCELLANEOUS) ×3 IMPLANT
IRRIG SUCT STRYKERFLOW 2 WTIP (MISCELLANEOUS) ×3
IRRIGATION SUCT STRKRFLW 2 WTP (MISCELLANEOUS) ×2 IMPLANT
KIT PROCEDURE DA VINCI SI (MISCELLANEOUS)
KIT PROCEDURE DVNC SI (MISCELLANEOUS) IMPLANT
KIT TURNOVER KIT A (KITS) IMPLANT
NEEDLE INSUFFLATION 14GA 120MM (NEEDLE) ×3 IMPLANT
PACK COLON (CUSTOM PROCEDURE TRAY) ×3 IMPLANT
PAD POSITIONING PINK XL (MISCELLANEOUS) ×3 IMPLANT
PENCIL SMOKE EVACUATOR (MISCELLANEOUS) IMPLANT
PORT LAP GEL ALEXIS MED 5-9CM (MISCELLANEOUS) ×3 IMPLANT
PROTECTOR NERVE ULNAR (MISCELLANEOUS) ×3 IMPLANT
RELOAD STAPLER 4.3X60 GRN DVNC (STAPLE) ×2 IMPLANT
RTRCTR WOUND ALEXIS 18CM MED (MISCELLANEOUS)
SCISSORS LAP 5X35 DISP (ENDOMECHANICALS) IMPLANT
SEAL CANN UNIV 5-8 DVNC XI (MISCELLANEOUS) ×8 IMPLANT
SEAL XI 5MM-8MM UNIVERSAL (MISCELLANEOUS) ×4
SEALER VESSEL DA VINCI XI (MISCELLANEOUS) ×1
SEALER VESSEL EXT DVNC XI (MISCELLANEOUS) ×2 IMPLANT
SLEEVE ADV FIXATION 5X100MM (TROCAR) IMPLANT
SOLUTION ELECTROLUBE (MISCELLANEOUS) ×3 IMPLANT
SPONGE GAUZE 2X2 STER 10/PKG (GAUZE/BANDAGES/DRESSINGS) ×1
STAPLER 45 BLU RELOAD XI (STAPLE) IMPLANT
STAPLER 45 BLUE RELOAD XI (STAPLE)
STAPLER 45 DA VINCI SURE FORM (STAPLE)
STAPLER 45 GREEN RELOAD XI (STAPLE)
STAPLER 45 GRN RELOAD XI (STAPLE) IMPLANT
STAPLER 45 SUREFORM DVNC (STAPLE) IMPLANT
STAPLER 60 DA VINCI SURE FORM (STAPLE) ×1
STAPLER 60 SUREFORM DVNC (STAPLE) ×2 IMPLANT
STAPLER CANNULA SEAL DVNC XI (STAPLE) ×2 IMPLANT
STAPLER CANNULA SEAL XI (STAPLE) ×1
STAPLER ECHELON POWER CIR 31 (STAPLE) ×3 IMPLANT
STAPLER RELOAD 4.3X60 GREEN (STAPLE) ×1
STAPLER RELOAD 4.3X60 GRN DVNC (STAPLE) ×2
STAPLER SHEATH (SHEATH)
STAPLER SHEATH ENDOWRIST DVNC (SHEATH) IMPLANT
STOPCOCK 4 WAY LG BORE MALE ST (IV SETS) ×6 IMPLANT
SURGILUBE 2OZ TUBE FLIPTOP (MISCELLANEOUS) ×3 IMPLANT
SUT MNCRL AB 4-0 PS2 18 (SUTURE) ×6 IMPLANT
SUT PDS AB 1 CT1 27 (SUTURE) ×6 IMPLANT
SUT PDS AB 1 TP1 96 (SUTURE) IMPLANT
SUT PROLENE 0 CT 2 (SUTURE) IMPLANT
SUT PROLENE 2 0 KS (SUTURE) ×6 IMPLANT
SUT PROLENE 2 0 SH DA (SUTURE) IMPLANT
SUT SILK 2 0 (SUTURE)
SUT SILK 2 0 SH CR/8 (SUTURE) ×3 IMPLANT
SUT SILK 2-0 18XBRD TIE 12 (SUTURE) IMPLANT
SUT SILK 3 0 (SUTURE) ×1
SUT SILK 3 0 SH CR/8 (SUTURE) ×3 IMPLANT
SUT SILK 3-0 18XBRD TIE 12 (SUTURE) ×2 IMPLANT
SUT V-LOC BARB 180 2/0GR6 GS22 (SUTURE)
SUT VIC AB 3-0 SH 18 (SUTURE) IMPLANT
SUT VIC AB 3-0 SH 27 (SUTURE)
SUT VIC AB 3-0 SH 27XBRD (SUTURE) IMPLANT
SUT VICRYL 0 UR6 27IN ABS (SUTURE) ×3 IMPLANT
SUTURE V-LC BRB 180 2/0GR6GS22 (SUTURE) IMPLANT
SYR 10ML ECCENTRIC (SYRINGE) ×3 IMPLANT
SYR 20ML LL LF (SYRINGE) ×3 IMPLANT
SYS LAPSCP GELPORT 120MM (MISCELLANEOUS)
SYSTEM LAPSCP GELPORT 120MM (MISCELLANEOUS) IMPLANT
TAPE UMBILICAL COTTON 1/8X30 (MISCELLANEOUS) ×3 IMPLANT
TOWEL OR NON WOVEN STRL DISP B (DISPOSABLE) ×3 IMPLANT
TRAY FOLEY MTR SLVR 14FR STAT (SET/KITS/TRAYS/PACK) ×3 IMPLANT
TROCAR ADV FIXATION 5X100MM (TROCAR) ×3 IMPLANT
TUBING CONNECTING 10 (TUBING) ×6 IMPLANT
TUBING INSUFFLATION 10FT LAP (TUBING) ×3 IMPLANT

## 2019-07-12 NOTE — H&P (Signed)
Tina [name]  DOB: [birth year]   `  `  Patient sent for surgical consultation at the request of [name], [facility]  Chief Complaint: Proximal rectal mass.  `  `  The patient is a pleasant woman. Originally from [place]. 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 She had a positive Cologuard &  underwent colonoscopy last year. Polyps removed. Polypoid mass noted in proximal rectum noted about 10-12 centimeters from anal verge. Biopsy consistent with adenomatous polyp. Recommended to follow-up with surgery. That did not happen. She tells me she was waiting for surgery to call. We did not get notified as far as I can tell. Then the [place] pandemic happened, and she held off. Popped upon GI radar in October 2020. Dr. [name] recommended repeat colonoscopy. Again polyp noted. Again adenomatous. No definite cancer high-grade dysplasia. CT scan done since bulky size. No evidence of frank metastatic disease. Do not definitely see the mass on CT (although I suspect it sent the rectosigmoid junction to my view). Surgical consultation again requested. She comes in by herself.   Patient lives by herself. She has a daughter in [place]. She usually moves her bowels every day. His never had any abdominal surgery. She had a heart attack in 2004. Required stenting. That was when she lived in [place]. She's quit smoking. She seen Dr. [name] with cardiology since she is, [place]. Has not seen her in the past few years. Tends to get care through Dr. [name]. Claims she can walk a half hour without difficulty. She is to use a treadmill rather regularly but has backed off and gain some weight as a result. No rectal bleeding. No personal nor family history of inflammatory bowel disease, irritable bowel syndrome, allergy such as Celiac Sprue, dietary/dairy problems, colitis, ulcers nor gastritis. No recent sick contacts/gastroenteritis. No travel outside the country. No changes in diet. No dysphagia to solids or liquids. No significant  heartburn or reflux. No hematochezia, hematemesis, coffee ground emesis. No evidence of prior gastric/peptic ulceration.   Cleared by cardiology.  Ready for surgery  (Review of systems as stated in this history (HPI) or in the review of systems. Otherwise all other 12 point ROS are negative)  `  `  `  Past Surgical History [name], [place]; 05/15/2019 11:13 AM)  No pertinent past surgical history  Diagnostic Studies History [name], [place]; 05/15/2019 11:13 AM)  Colonoscopy within last year  Mammogram within last year  Pap Smear 1-5 years ago  Allergies [name], CMA; 05/15/2019 11:14 AM)  No Known Drug Allergies [05/15/2019]:  Allergies Reconciled  Medication History [name], CMA; 05/15/2019 11:15 AM)  Aspirin (81MG  Tablet, Oral) Active.  Calcium Carbonate (1250 (500 Ca)MG Capsule, Oral) Active.  Fish Oil (300MG  Capsule, Oral) Active.  Rosuvastatin Calcium (20MG  Tablet, Oral) Active.  Ramipril (5MG  Capsule, Oral) Active.  Alendronate Sodium (70MG  Tablet, Oral) Active.  Medications Reconciled  Social History [name], [place]; 05/15/2019 11:13 AM)  Alcohol use Moderate alcohol use.  Caffeine use Carbonated beverages, Coffee.  No drug use  Tobacco use Former smoker.  Family History [name], [place]; 05/15/2019 11:13 AM)  Arthritis Brother.  Cancer Sister.  Heart Disease Father.  Heart disease in female family member before age 7  Hypertension Brother, Mother, Sister.  Ovarian Cancer Mother.  Seizure disorder Daughter.  Pregnancy / Birth History [name], [place]; 05/15/2019 11:13 AM)  Age at menarche 36 years.  Age of menopause 29-60  Gravida 2  Length (months) of breastfeeding 3-6  Maternal age 79-25  Para 2  Other Problems [name], [place]; 05/15/2019 11:13 AM)  Chest pain  Gastroesophageal Reflux Disease  Hemorrhoids  High blood pressure  Hypercholesterolemia  Myocardial infarction  Sleep Apnea  Thyroid Disease    Review of Systems [name] CMA; 05/15/2019 11:13 AM)  General Not Present- Appetite Loss, Chills, Fatigue, Fever, Night Sweats, Weight Gain and Weight Loss.  Skin Not Present- Change in Wart/Mole, Dryness, Hives, Jaundice, New Lesions, Non-Healing Wounds, Rash and Ulcer.  HEENT Present- Seasonal Allergies and Wears glasses/contact lenses. Not Present- Earache, Hearing Loss, Hoarseness, Nose Bleed, Oral Ulcers, Ringing in the Ears, Sinus Pain, Sore Throat, Visual Disturbances and Yellow Eyes.  Respiratory Present- Snoring. Not Present- Bloody sputum, Chronic Cough, Difficulty Breathing and Wheezing.  Breast Not Present- Breast Mass, Breast Pain, Nipple Discharge and Skin Changes.  Cardiovascular Not Present- Chest Pain, Difficulty Breathing Lying Down, Leg Cramps, Palpitations, Rapid Heart Rate, Shortness of Breath and Swelling of Extremities.  Gastrointestinal Present- Bloating, Excessive gas and Hemorrhoids. Not Present- Abdominal Pain, Bloody Stool, Change in Bowel Habits, Chronic diarrhea, Constipation, Difficulty Swallowing, Gets full quickly at meals, Indigestion, Nausea, Rectal Pain and Vomiting.  Female Genitourinary Present- Frequency and Urgency. Not Present- Nocturia, Painful Urination and Pelvic Pain.  Musculoskeletal Present- Muscle Weakness. Not Present- Back Pain, Joint Pain, Joint Stiffness, Muscle Pain and Swelling of Extremities.  Neurological Present- Decreased Memory. Not Present- Fainting, Headaches, Numbness, Seizures, Tingling, Tremor, Trouble walking and Weakness.  Psychiatric Not Present- Anxiety, Bipolar, Change in Sleep Pattern, Depression, Fearful and Frequent crying.  Endocrine Present- Hair Changes. Not Present- Cold Intolerance, Excessive Hunger, Heat Intolerance, Hot flashes and New Diabetes.  Hematology Not Present- Blood Thinners, Easy Bruising, Excessive bleeding, Gland problems, HIV and Persistent Infections.    Vitals [name] CMA; 05/15/2019  11:14 AM)  05/15/2019 11:14 AM  Weight: 201 lb Height: 65 in  Body Surface Area: 1.98 m Body Mass Index: 33.45 kg/m  Temp.: 97.5 F Pulse: 82 (Regular)  BP: 132/82 (Sitting, Left Arm, Standard)   07/12/2019 BP (!) 148/80   Pulse (!) 103   Temp 97.7 F (36.5 C) (Oral)   Resp 20   SpO2 100%    Physical Exam Adin Hector MD; 05/15/2019 1:09 PM)  General  Mental Status - Alert.  General Appearance - Not in acute distress, Not Sickly.  Orientation - Oriented X3.  Hydration - Well hydrated.  Voice - Normal.  Integumentary  Global Assessment  Upon inspection and palpation of skin surfaces of the - Axillae: non-tender, no inflammation or ulceration, no drainage. and Distribution of scalp and body hair is normal.  General Characteristics  Temperature - normal warmth is noted.  Head and Neck  Head - normocephalic, atraumatic with no lesions or palpable masses.  Face  Global Assessment - atraumatic, no absence of expression.  Neck  Global Assessment - no abnormal movements, no bruit auscultated on the right, no bruit auscultated on the left, no decreased range of motion, non-tender.  Trachea - midline.  Thyroid  Gland Characteristics - non-tender.  Eye  Eyeball - Left - Extraocular movements intact, No Nystagmus - Left.  Eyeball - Right - Extraocular movements intact, No Nystagmus - Right.  Cornea - Left - No Hazy - Left.  Cornea - Right - No Hazy - Right.  Sclera/Conjunctiva - Left - No scleral icterus, No Discharge - Left.  Sclera/Conjunctiva - Right - No scleral icterus, No Discharge - Right.  Pupil - Left - Direct reaction to  light normal.  Pupil - Right - Direct reaction to light normal.  ENMT  Ears  Pinna - Left - no drainage observed, no generalized tenderness observed. Pinna - Right - no drainage observed, no generalized tenderness observed.  Nose and Sinuses  External Inspection of the Nose - no destructive lesion observed. Inspection of the nares - Left - quiet  respiration. Inspection of the nares - Right - quiet respiration.  Mouth and Throat  Lips - Upper Lip - no fissures observed, no pallor noted. Lower Lip - no fissures observed, no pallor noted. Nasopharynx - no discharge present. Oral Cavity/Oropharynx - Tongue - no dryness observed. Oral Mucosa - no cyanosis observed. Hypopharynx - no evidence of airway distress observed.  Chest and Lung Exam  Inspection  Movements - Normal and Symmetrical. Accessory muscles - No use of accessory muscles in breathing.  Palpation  Palpation of the chest reveals - Non-tender.  Auscultation  Breath sounds - Normal and Clear.  Cardiovascular  Auscultation  Rhythm - Regular. Murmurs & Other Heart Sounds - Auscultation of the heart reveals - No Murmurs and No Systolic Clicks.  Abdomen  Inspection  Inspection of the abdomen reveals - No Visible peristalsis and No Abnormal pulsations. Umbilicus - No Bleeding, No Urine drainage.  Palpation/Percussion  Palpation and Percussion of the abdomen reveal - Soft, Non Tender, No Rebound tenderness, No Rigidity (guarding) and No Cutaneous hyperesthesia.  Note: Abdomen overweight but soft. Not severely distended. No distasis recti. No umbilical or other anterior abdominal wall hernias  Female Genitourinary  Sexual Maturity  Tanner 5 - Adult hair pattern.  Note: No incisions. No inguinal hernias. No lymphadenopathy. No vaginal bleeding nor discharge  Rectal  Note: Perianal skin clear. Anterior anal tag. Normal sphincter tone. I feel no rectal masses to 7 cm circumferentially. Thin rectovaginal septum with rectocele.  Peripheral Vascular  Upper Extremity  Inspection - Left - No Cyanotic nailbeds - Left, Not Ischemic. Inspection - Right - No Cyanotic nailbeds - Right, Not Ischemic.  Neurologic  Neurologic evaluation reveals - normal attention span and ability to concentrate, able to name objects and repeat phrases. Appropriate fund of knowledge , normal sensation and normal  coordination.  Mental Status  Affect - not angry, not paranoid.  Cranial Nerves - Normal Bilaterally.  Gait - Normal.  Neuropsychiatric  Mental status exam performed with findings of - able to articulate well with normal speech/language, rate, volume and coherence, thought content normal with ability to perform basic computations and apply abstract reasoning and no evidence of hallucinations, delusions, obsessions or homicidal/suicidal ideation.  Musculoskeletal  Global Assessment  Spine, Ribs and Pelvis - no instability, subluxation or laxity. Right Upper Extremity - no instability, subluxation or laxity.  Lymphatic  Head & Neck  General Head & Neck Lymphatics: Bilateral - Description - No Localized lymphadenopathy.  Axillary  General Axillary Region: Bilateral - Description - No Localized lymphadenopathy.  Femoral & Inguinal  Generalized Femoral & Inguinal Lymphatics: Left - Description - No Localized lymphadenopathy. Right - Description - No Localized lymphadenopathy.   Assessment & Plan Adin Hector MD; 05/15/2019 12:03 PM)  ADENOMATOUS RECTAL POLYP (D12.8)  Impression: Bulky proximal rectal mass. Biopsy consistent with adenoma. No definite metastatic disease.  I think she'll benefit from removal of this mass. Because it is rather proximal and looks like the rectosigmoid on CT scan, I think this would be too much to try and do by TEM. I recommend a low anterior resection for better margins. I  think this is proximal enough that the risk of needing diverting loop ileostomy and leak is rather low. She is otherwise active. She wishes to be aggressive and proceed with surgery.  She would like to try an time this around the time in February when her daughters coming up to visit her in [place]. That is not unreasonable.  She has a distant history of having a myocardial infarction. It's been over a decade. She did have a stent. She seen Dr. [name] intermittently. She has clearance from Dr.  Harrington Challenger.   The anatomy & physiology of the digestive tract was discussed. The pathophysiology of the rectal pathology was discussed. Natural history risks without surgery was discussed. I worked to give an overview of the disease and the frequent need to have multispecialty involvement. I feel the risks of no intervention will lead to serious problems that outweigh the operative risks; therefore, I recommended a partial proctocolectomy to remove the pathology. Minimally Invasive (Robotic/Laparoscopic) & open techniques were discussed. We will work to preserve anal & pelvic floor function without sacrificing cure.  Risks such as bleeding, infection, abscess, leak, reoperation, possible temporary or permanent ostomy, hernia, heart attack, death, and other risks were discussed. I noted a good likelihood this will help address the problem. Goals of post-operative recovery were discussed as well. We will work to minimize complications. Educational information was available as well. Questions were answered. The patient expresses understanding & wishes to proceed with surgery.    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 That has happened.    Adin Hector, MD, FACS, MASCRS  Gastrointestinal and Minimally Invasive Surgery  [facility]  [address] [place]  [place], [place] [address]  [phone] Main / Paging  [phone] Fax

## 2019-07-12 NOTE — Discharge Instructions (Signed)
SURGERY: POST OP INSTRUCTIONS (Surgery for small bowel obstruction, colon resection, etc)   ######################################################################  EAT Gradually transition to a high fiber diet with a fiber supplement over the next few days after discharge  WALK Walk an hour a day.  Control your pain to do that.    CONTROL PAIN Control pain so that you can walk, sleep, tolerate sneezing/coughing, go up/down stairs.  HAVE A BOWEL MOVEMENT DAILY Keep your bowels regular to avoid problems.  OK to try a laxative to override constipation.  OK to use an antidairrheal to slow down diarrhea.  Call if not better after 2 tries  CALL IF YOU HAVE PROBLEMS/CONCERNS Call if you are still struggling despite following these instructions. Call if you have concerns not answered by these instructions  ######################################################################   DIET Follow a light diet the first few days at home.  Start with a bland diet such as soups, liquids, starchy foods, low fat foods, etc.  If you feel full, bloated, or constipated, stay on a ful liquid or pureed/blenderized diet for a few days until you feel better and no longer constipated. Be sure to drink plenty of fluids every day to avoid getting dehydrated (feeling dizzy, not urinating, etc.). Gradually add a fiber supplement to your diet over the next week.  Gradually get back to a regular solid diet.  Avoid fast food or heavy meals the first week as you are more likely to get nauseated. It is expected for your digestive tract to need a few months to get back to normal.  It is common for your bowel movements and stools to be irregular.  You will have occasional bloating and cramping that should eventually fade away.  Until you are eating solid food normally, off all pain medications, and back to regular activities; your bowels will not be normal. Focus on eating a low-fat, high fiber diet the rest of your life  (See Getting to Good Bowel Health, below).  CARE of your INCISION or WOUND It is good for closed incision and even open wounds to be washed every day.  Shower every day.  Short baths are fine.  Wash the incisions and wounds clean with soap & water.    If you have a closed incision(s), wash the incision with soap & water every day.  You may leave closed incisions open to air if it is dry.   You may cover the incision with clean gauze & replace it after your daily shower for comfort. If you have skin tapes (Steristrips) or skin glue (Dermabond) on your incision, leave them in place.  They will fall off on their own like a scab.  You may trim any edges that curl up with clean scissors.  If you have staples, set up an appointment for them to be removed in the office in 10 days after surgery.  If you have a drain, wash around the skin exit site with soap & water and place a new dressing of gauze or band aid around the skin every day.  Keep the drain site clean & dry.    If you have an open wound with packing, see wound care instructions.  In general, it is encouraged that you remove your dressing and packing, shower with soap & water, and replace your dressing once a day.  Pack the wound with clean gauze moistened with normal (0.9%) saline to keep the wound moist & uninfected.  Pressure on the dressing for 30 minutes will stop most wound   bleeding.  Eventually your body will heal & pull the open wound closed over the next few months.  Raw open wounds will occasionally bleed or secrete yellow drainage until it heals closed.  Drain sites will drain a little until the drain is removed.  Even closed incisions can have mild bleeding or drainage the first few days until the skin edges scab over & seal.   If you have an open wound with a wound vac, see wound vac care instructions.     ACTIVITIES as tolerated Start light daily activities --- self-care, walking, climbing stairs-- beginning the day after surgery.   Gradually increase activities as tolerated.  Control your pain to be active.  Stop when you are tired.  Ideally, walk several times a day, eventually an hour a day.   Most people are back to most day-to-day activities in a few weeks.  It takes 4-8 weeks to get back to unrestricted, intense activity. If you can walk 30 minutes without difficulty, it is safe to try more intense activity such as jogging, treadmill, bicycling, low-impact aerobics, swimming, etc. Save the most intensive and strenuous activity for last (Usually 4-8 weeks after surgery) such as sit-ups, heavy lifting, contact sports, etc.  Refrain from any intense heavy lifting or straining until you are off narcotics for pain control.  You will have off days, but things should improve week-by-week. DO NOT PUSH THROUGH PAIN.  Let pain be your guide: If it hurts to do something, don't do it.  Pain is your body warning you to avoid that activity for another week until the pain goes down. You may drive when you are no longer taking narcotic prescription pain medication, you can comfortably wear a seatbelt, and you can safely make sudden turns/stops to protect yourself without hesitating due to pain. You may have sexual intercourse when it is comfortable. If it hurts to do something, stop.  MEDICATIONS Take your usually prescribed home medications unless otherwise directed.   Blood thinners:  Usually you can restart any strong blood thinners after the second postoperative day.  It is OK to take aspirin right away.     If you are on strong blood thinners (warfarin/Coumadin, Plavix, Xerelto, Eliquis, Pradaxa, etc), discuss with your surgeon, medicine PCP, and/or cardiologist for instructions on when to restart the blood thinner & if blood monitoring is needed (PT/INR blood check, etc).     PAIN CONTROL Pain after surgery or related to activity is often due to strain/injury to muscle, tendon, nerves and/or incisions.  This pain is usually  short-term and will improve in a few months.  To help speed the process of healing and to get back to regular activity more quickly, DO THE FOLLOWING THINGS TOGETHER: 1. Increase activity gradually.  DO NOT PUSH THROUGH PAIN 2. Use Ice and/or Heat 3. Try Gentle Massage and/or Stretching 4. Take over the counter pain medication 5. Take Narcotic prescription pain medication for more severe pain  Good pain control = faster recovery.  It is better to take more medicine to be more active than to stay in bed all day to avoid medications. 1.  Increase activity gradually Avoid heavy lifting at first, then increase to lifting as tolerated over the next 6 weeks. Do not "push through" the pain.  Listen to your body and avoid positions and maneuvers than reproduce the pain.  Wait a few days before trying something more intense Walking an hour a day is encouraged to help your body recover faster   and more safely.  Start slowly and stop when getting sore.  If you can walk 30 minutes without stopping or pain, you can try more intense activity (running, jogging, aerobics, cycling, swimming, treadmill, sex, sports, weightlifting, etc.) Remember: If it hurts to do it, then don't do it! 2. Use Ice and/or Heat You will have swelling and bruising around the incisions.  This will take several weeks to resolve. Ice packs or heating pads (6-8 times a day, 30-60 minutes at a time) will help sooth soreness & bruising. Some people prefer to use ice alone, heat alone, or alternate between ice & heat.  Experiment and see what works best for you.  Consider trying ice for the first few days to help decrease swelling and bruising; then, switch to heat to help relax sore spots and speed recovery. Shower every day.  Short baths are fine.  It feels good!  Keep the incisions and wounds clean with soap & water.   3. Try Gentle Massage and/or Stretching Massage at the area of pain many times a day Stop if you feel pain - do not  overdo it 4. Take over the counter pain medication This helps the muscle and nerve tissues become less irritable and calm down faster Choose ONE of the following over-the-counter anti-inflammatory medications: Acetaminophen 500mg tabs (Tylenol) 1-2 pills with every meal and just before bedtime (avoid if you have liver problems or if you have acetaminophen in you narcotic prescription) Naproxen 220mg tabs (ex. Aleve, Naprosyn) 1-2 pills twice a day (avoid if you have kidney, stomach, IBD, or bleeding problems) Ibuprofen 200mg tabs (ex. Advil, Motrin) 3-4 pills with every meal and just before bedtime (avoid if you have kidney, stomach, IBD, or bleeding problems) Take with food/snack several times a day as directed for at least 2 weeks to help keep pain / soreness down & more manageable. 5. Take Narcotic prescription pain medication for more severe pain A prescription for strong pain control is often given to you upon discharge (for example: oxycodone/Percocet, hydrocodone/Norco/Vicodin, or tramadol/Ultram) Take your pain medication as prescribed. Be mindful that most narcotic prescriptions contain Tylenol (acetaminophen) as well - avoid taking too much Tylenol. If you are having problems/concerns with the prescription medicine (does not control pain, nausea, vomiting, rash, itching, etc.), please call us [phone] to see if we need to switch you to a different pain medicine that will work better for you and/or control your side effects better. If you need a refill on your pain medication, you must call the office before 4 pm and on weekdays only.  By federal law, prescriptions for narcotics cannot be called into a pharmacy.  They must be filled out on paper & picked up from our office by the patient or authorized caretaker.  Prescriptions cannot be filled after 4 pm nor on weekends.    WHEN TO CALL US [phone] Severe uncontrolled or worsening pain  Fever over 101 F (38.5 C) Concerns with  the incision: Worsening pain, redness, rash/hives, swelling, bleeding, or drainage Reactions / problems with new medications (itching, rash, hives, nausea, etc.) Nausea and/or vomiting Difficulty urinating Difficulty breathing Worsening fatigue, dizziness, lightheadedness, blurred vision Other concerns If you are not getting better after two weeks or are noticing you are getting worse, contact our office [phone] for further advice.  We may need to adjust your medications, re-evaluate you in the office, send you to the emergency room, or see what other things we can do to help. The   clinic staff is available to answer your questions during regular business hours (8:30am-5pm).  Please don't hesitate to call and ask to speak to one of our nurses for clinical concerns.    A surgeon from [facility] is always on call at the hospitals 24 hours/day If you have a medical emergency, go to the nearest emergency room or call 911.  FOLLOW UP in our office One the day of your discharge from the hospital (or the next business weekday), please call [facility] to set up or confirm an appointment to see your surgeon in the office for a follow-up appointment.  Usually it is 2-3 weeks after your surgery.   If you have skin staples at your incision(s), let the office know so we can set up a time in the office for the nurse to remove them (usually around 10 days after surgery). Make sure that you call for appointments the day of discharge (or the next business weekday) from the hospital to ensure a convenient appointment time. IF YOU HAVE DISABILITY OR FAMILY LEAVE FORMS, BRING THEM TO THE OFFICE FOR PROCESSING.  DO NOT GIVE THEM TO YOUR DOCTOR.  [facility] [address], [address], , [place]  [address] ? [phone] - Main [phone] - Toll Free,  [phone] - Fax [email or web address]  GETTING TO GOOD BOWEL HEALTH. It is  expected for your digestive tract to need a few months to get back to normal.  It is common for your bowel movements and stools to be irregular.  You will have occasional bloating and cramping that should eventually fade away.  Until you are eating solid food normally, off all pain medications, and back to regular activities; your bowels will not be normal.   Avoiding constipation The goal: ONE SOFT BOWEL MOVEMENT A DAY!    Drink plenty of fluids.  Choose water first. TAKE A FIBER SUPPLEMENT EVERY DAY THE REST OF YOUR LIFE During your first week back home, gradually add back a fiber supplement every day Experiment which form you can tolerate.   There are many forms such as powders, tablets, wafers, gummies, etc Psyllium bran (Metamucil), methylcellulose (Citrucel), Miralax or Glycolax, Benefiber, Flax Seed.  Adjust the dose week-by-week (1/2 dose/day to 6 doses a day) until you are moving your bowels 1-2 times a day.  Cut back the dose or try a different fiber product if it is giving you problems such as diarrhea or bloating. Sometimes a laxative is needed to help jump-start bowels if constipated until the fiber supplement can help regulate your bowels.  If you are tolerating eating & you are farting, it is okay to try a gentle laxative such as double dose MiraLax, prune juice, or Milk of Magnesia.  Avoid using laxatives too often. Stool softeners can sometimes help counteract the constipating effects of narcotic pain medicines.  It can also cause diarrhea, so avoid using for too long. If you are still constipated despite taking fiber daily, eating solids, and a few doses of laxatives, call our office. Controlling diarrhea Try drinking liquids and eating bland foods for a few days to avoid stressing your intestines further. Avoid dairy products (especially milk & ice cream) for a short time.  The intestines often can lose the ability to digest lactose when stressed. Avoid foods that cause gassiness or  bloating.  Typical foods include beans and other legumes, cabbage, broccoli, and dairy foods.  Avoid greasy, spicy, fast foods.  Every person has   some sensitivity to other foods, so listen to your body and avoid those foods that trigger problems for you. Probiotics (such as active yogurt, Align, etc) may help repopulate the intestines and colon with normal bacteria and calm down a sensitive digestive tract Adding a fiber supplement gradually can help thicken stools by absorbing excess fluid and retrain the intestines to act more normally.  Slowly increase the dose over a few weeks.  Too much fiber too soon can backfire and cause cramping & bloating. It is okay to try and slow down diarrhea with a few doses of antidiarrheal medicines.   Bismuth subsalicylate (ex. Kayopectate, Pepto Bismol) for a few doses can help control diarrhea.  Avoid if pregnant.   Loperamide (Imodium) can slow down diarrhea.  Start with one tablet (2mg) first.  Avoid if you are having fevers or severe pain.  ILEOSTOMY PATIENTS WILL HAVE CHRONIC DIARRHEA since their colon is not in use.    Drink plenty of liquids.  You will need to drink even more glasses of water/liquid a day to avoid getting dehydrated. Record output from your ileostomy.  Expect to empty the bag every 3-4 hours at first.  Most people with a permanent ileostomy empty their bag 4-6 times at the least.   Use antidiarrheal medicine (especially Imodium) several times a day to avoid getting dehydrated.  Start with a dose at bedtime & breakfast.  Adjust up or down as needed.  Increase antidiarrheal medications as directed to avoid emptying the bag more than 8 times a day (every 3 hours). Work with your wound ostomy nurse to learn care for your ostomy.  See ostomy care instructions. TROUBLESHOOTING IRREGULAR BOWELS 1) Start with a soft & bland diet. No spicy, greasy, or fried foods.  2) Avoid gluten/wheat or dairy products from diet to see if symptoms improve. 3) Miralax  17gm or flax seed mixed in 8oz. water or juice-daily. May use 2-4 times a day as needed. 4) Gas-X, Phazyme, etc. as needed for gas & bloating.  5) Prilosec (omeprazole) over-the-counter as needed 6)  Consider probiotics (Align, Activa, etc) to help calm the bowels down  Call your doctor if you are getting worse or not getting better.  Sometimes further testing (cultures, endoscopy, X-ray studies, CT scans, bloodwork, etc.) may be needed to help diagnose and treat the cause of the diarrhea. [facility] [address], [address], [place], [place]  [address] [phone] - Main.    [phone]  - Toll Free.   [phone] - Fax [email or web address]   Colon Polyps  Polyps are tissue growths inside the body. Polyps can grow in many places, including the large intestine (colon). A polyp may be a round bump or a mushroom-shaped growth. You could have one polyp or several. Most colon polyps are noncancerous (benign). However, some colon polyps can become cancerous over time. Finding and removing the polyps early can help prevent this. What are the causes? The exact cause of colon polyps is not known. What increases the risk? You are more likely to develop this condition if you:  Have a family history of colon cancer or colon polyps.  Are older than 50 or older than 45 if you are African American.  Have inflammatory bowel disease, such as ulcerative colitis or Crohn's disease.  Have certain hereditary conditions, such as: ? Familial adenomatous polyposis. ? Lynch syndrome. ? Turcot syndrome. ? Peutz-Jeghers syndrome.  Are overweight.  Smoke cigarettes.  Do not get enough exercise.    Drink too much alcohol.  Eat a diet that is high in fat and red meat and low in fiber.  Had childhood cancer that was treated with abdominal radiation. What are the signs or symptoms? Most polyps do not cause symptoms. If you have symptoms, they may  include:  Blood coming from your rectum when having a bowel movement.  Blood in your stool. The stool may look dark red or black.  Abdominal pain.  A change in bowel habits, such as constipation or diarrhea. How is this diagnosed? This condition is diagnosed with a colonoscopy. This is a procedure in which a lighted, flexible scope is inserted into the anus and then passed into the colon to examine the area. Polyps are sometimes found when a colonoscopy is done as part of routine cancer screening tests. How is this treated? Treatment for this condition involves removing any polyps that are found. Most polyps can be removed during a colonoscopy. Those polyps will then be tested for cancer. Additional treatment may be needed depending on the results of testing. Follow these instructions at home: Lifestyle  Maintain a healthy weight, or lose weight if recommended by your health care provider.  Exercise every day or as told by your health care provider.  Do not use any products that contain nicotine or tobacco, such as cigarettes and e-cigarettes. If you need help quitting, ask your health care provider.  If you drink alcohol, limit how much you have: ? 0-1 drink a day for women. ? 0-2 drinks a day for men.  Be aware of how much alcohol is in your drink. In the [place] one drink equals one 12 oz bottle of beer (355 mL), one 5 oz glass of wine (148 mL), or one 1 oz shot of hard liquor (44 mL). Eating and drinking   Eat foods that are high in fiber, such as fruits, vegetables, and whole grains.  Eat foods that are high in calcium and vitamin D, such as milk, cheese, yogurt, eggs, liver, fish, and broccoli.  Limit foods that are high in fat, such as fried foods and desserts.  Limit the amount of red meat and processed meat you eat, such as hot dogs, sausage, bacon, and lunch meats. General instructions  Keep all follow-up visits as told by your health care provider. This is  important. ? This includes having regularly scheduled colonoscopies. ? Talk to your health care provider about when you need a colonoscopy. Contact a health care provider if:  You have new or worsening bleeding during a bowel movement.  You have new or increased blood in your stool.  You have a change in bowel habits.  You lose weight for no known reason. Summary  Polyps are tissue growths inside the body. Polyps can grow in many places, including the colon.  Most colon polyps are noncancerous (benign), but some can become cancerous over time.  This condition is diagnosed with a colonoscopy.  Treatment for this condition involves removing any polyps that are found. Most polyps can be removed during a colonoscopy. This information is not intended to replace advice given to you by your health care provider. Make sure you discuss any questions you have with your health care provider. Document Revised: 09/02/2017 Document Reviewed: 09/02/2017 [employer] Patient Education  2020 [employer]  

## 2019-07-12 NOTE — Interval H&P Note (Signed)
History and Physical Interval Note:  07/12/2019 10:51 AM  Tina [name]  has presented today for surgery, with the diagnosis of PROXIMAL RECTAL POLYP.  The various methods of treatment have been discussed with the patient and family. After consideration of risks, benefits and other options for treatment, the patient has consented to  Procedure(s): XI ROBOT ASSISTED RESECTION OF RECTOSIGMOID COLON, POSSIBLE OSTOMY (N/A) RIGID PROCTOSCOPY (N/A) as a surgical intervention.  The patient's history has been reviewed, patient examined, no change in status, stable for surgery.  I have reviewed the patient's chart and labs.  Questions were answered to the patient's satisfaction.     I have re-reviewed the the patient's records, history, medications, and allergies.  I have re-examined the patient.  I again discussed intraoperative plans and goals of post-operative recovery.  The patient agrees to proceed.  EILEY MALCOLM  1945-10-25 [id number]  Patient Care Team: Carollee Herter, Alferd Apa, DO as PCP - General Adron Bene, DDS as Consulting Physician (Dentistry) Jerline Pain Mingo Amber, DO as Consulting Physician (Optometry) Fay Records, MD as Consulting Physician (Cardiology) Jacelyn Pi, MD as Referring Physician (Endocrinology) [name] Kirke Corin, MD as Consulting Physician (Gastroenterology) Michael Boston, MD as Consulting Physician (General Surgery)  Patient Active Problem List   Diagnosis Date Noted   Adenomatous polyp of rectum 05/15/2019   Age-related osteoporosis without current pathological fracture 04/06/2019   Sinus bradycardia 02/24/2017   Post-menopausal bleeding 10/28/2016   Left shoulder pain 01/22/2016   Right wrist injury 03/18/2015   Obesity (BMI 30-39.9) 05/29/2013   Otitis externa 09/30/2010   CONJUNCTIVITIS, BACTERIAL 12/16/2009   SINUSITIS- ACUTE-NOS 08/01/2009   TOBACCO ABUSE, HX OF 07/22/2009   OTITIS EXTERNA, ACUTE, RIGHT 02/08/2009   Goiter 02/10/2007   Hyperlipidemia  02/10/2007   Essential hypertension 02/10/2007   MYOCARDIAL INFARCTION, HX OF 02/10/2007   DIZZINESS 02/10/2007   EDEMA LEG 02/10/2007   FATTY LIVER DISEASE, HX OF 02/10/2007    Past Medical History:  Diagnosis Date   Allergy    Cataract    Fatty liver    Goiter    HTN (hypertension)    Hyperlipidemia    Myocardial infarct ([place]) hx of 2004   Osteoporosis    Sleep apnea    no cpap   Wrist fracture     Past Surgical History:  Procedure Laterality Date   CARDIAC CATHETERIZATION  2004   stent   sleep study  2012    Social History   Socioeconomic History   Marital status: Divorced    Spouse name: Not on file   Number of children: Not on file   Years of education: Not on file   Highest education level: Not on file  Occupational History   Occupation: nurse    Employer: OTHER    Comment: [employer]   Tobacco Use   Smoking status: Former Smoker    Packs/day: 1.00    Years: 30.00    Pack years: 30.00    Quit date: 12/13/2002    Years since quitting: 16.5   Smokeless tobacco: Former Systems developer    Quit date: 06/01/2002  Substance and Sexual Activity   Alcohol use: Yes    Comment: 1-2 glasses of wine with dinner   Drug use: No   Sexual activity: Not Currently    Partners: Male  Other Topics Concern   Not on file  Social History Narrative   Occupation:[employer]   Divorced   Former Smoker quit 2004   Drug use-no  Regular exercise-treadmill 3x a week   Originially from [place]   Social Determinants of [employer] Strain:    Difficulty of Paying Living Expenses: Not on file  Food Insecurity:    Worried About Charity fundraiser in the Last Year: Not on file   [employer] of Food in the Last Year: Not on file  Transportation Needs:    Lack of Transportation (Medical): Not on file   Lack of Transportation (Non-Medical): Not on file  Physical Activity:    Days of Exercise per Week: Not on file   Minutes of Exercise per Session: Not on file   Stress:    Feeling of Stress : Not on file  Social Connections:    Frequency of Communication with Friends and Family: Not on file   Frequency of Social Gatherings with Friends and Family: Not on file   Attends Religious Services: Not on file   Active Member of Clubs or Organizations: Not on file   Attends Archivist Meetings: Not on file   Marital Status: Not on file  Intimate Partner Violence:    Fear of Current or Ex-Partner: Not on file   Emotionally Abused: Not on file   Physically Abused: Not on file   Sexually Abused: Not on file    Family History  Problem Relation Age of Onset   Hypertension Mother    Heart disease Mother 51       MI--- died from #3 at 53   Ovarian cancer Mother    Hypertension Father    Cancer Father 62       ovarian   Heart disease Father    Hypertension Sister    Cancer Sister 54       brain tumor   Coronary artery disease Other    Lung cancer Other    Ovarian cancer Other    Diabetes Maternal Grandmother    Hypertension Brother    Arthritis Brother    Colon cancer Neg Hx    Colon polyps Neg Hx    Esophageal cancer Neg Hx    Stomach cancer Neg Hx    Rectal cancer Neg Hx     Medications Prior to Admission  Medication Sig Dispense Refill Last Dose   aspirin 81 MG tablet Take 81 mg by mouth daily.     07/12/2019 at 0830   Calcium Carbonate-Vit D-Min (CALCIUM 1200 PO) Take 1,200 mg by mouth daily. 1 tab po qd   07/11/2019 at Unknown time   Multiple Vitamin (MULTIVITAMIN) capsule Take 1 capsule by mouth daily.     07/11/2019 at Unknown time   Omega-3 Fatty Acids (FISH OIL) 1000 MG CAPS Take 2,000 mg by mouth daily.   07/11/2019 at Unknown time   ramipril (ALTACE) 5 MG capsule TAKE 1 CAPSULE(5 MG) BY MOUTH DAILY (Patient taking differently: Take 5 mg by mouth daily. ) 90 capsule 1 07/11/2019 at Unknown time   rosuvastatin (CRESTOR) 20 MG tablet Take 0.5 tablets (10 mg total) by mouth daily. 45 tablet 1 07/11/2019 at Unknown time   alendronate  (FOSAMAX) 70 MG tablet TAKE ONE TABLET BY MOUTH ONCE WEEKLY ON EMPTY STOMACH WITH FULL GLASS OF WATER 12 tablet 1 07/09/2019    Current Facility-Administered Medications  Medication Dose Route Frequency Provider Last Rate Last Admin   acetaminophen (TYLENOL) tablet 1,000 mg  1,000 mg Oral On Call to OR Michael Boston, MD       alvimopan (ENTEREG) capsule 12 mg  12 mg Oral On Call to OR Michael Boston, MD       bupivacaine liposome (EXPAREL) 1.3 % injection 266 mg  20 mL Infiltration Once Michael Boston, MD       cefoTEtan (CEFOTAN) 2 g in sodium chloride 0.9 % 100 mL IVPB  2 g Intravenous On Call to OR Michael Boston, MD       clindamycin (CLEOCIN) 900 mg, gentamicin (GARAMYCIN) 240 mg in sodium chloride 0.9 % 1,000 mL for intraperitoneal lavage   Irrigation To OR Michael Boston, MD       enoxaparin (LOVENOX) injection 40 mg  40 mg Subcutaneous Once Michael Boston, MD       gabapentin (NEURONTIN) capsule 300 mg  300 mg Oral On Call to OR Michael Boston, MD         No Known Allergies  BP (!) 148/80   Pulse (!) 103   Temp 97.7 F (36.5 C) (Oral)   Resp 20   SpO2 100%   Labs: Results for orders placed or performed during the hospital encounter of 07/10/19 (from the past 48 hour(s))  Hemoglobin A1c     Status: None   Collection Time: 07/10/19 12:50 PM  Result Value Ref Range   Hgb A1c MFr Bld 5.4 4.8 - 5.6 %    Comment: (NOTE)         Prediabetes: 5.7 - 6.4         Diabetes: >6.4         Glycemic control for adults with diabetes: <7.0    Mean Plasma Glucose 108 mg/dL    Comment: (NOTE) Performed At: [facility] [place], [place] [id number] Rush Farmer MD [id number]   Basic metabolic panel     Status: None   Collection Time: 07/10/19 12:50 PM  Result Value Ref Range   Sodium 135 135 - 145 mmol/L   Potassium 3.9 3.5 - 5.1 mmol/L   Chloride 99 98 - 111 mmol/L   CO2 26 22 - 32 mmol/L   Glucose, Bld 99 70 - 99 mg/dL   BUN 15 8 - 23 mg/dL   Creatinine, Ser  0.71 0.44 - 1.00 mg/dL   Calcium 10.1 8.9 - 10.3 mg/dL   GFR calc non Af Amer >60 >60 mL/min   GFR calc Af Amer >60 >60 mL/min   Anion gap 10 5 - 15    Comment: Performed at [facility], [place] [address], [place], [place] [address]  CBC     Status: None   Collection Time: 07/10/19 12:50 PM  Result Value Ref Range   WBC 7.2 4.0 - 10.5 K/uL   RBC 4.57 3.87 - 5.11 MIL/uL   Hemoglobin 14.3 12.0 - 15.0 g/dL   HCT 42.2 36.0 - 46.0 %   MCV 92.3 80.0 - 100.0 fL   MCH 31.3 26.0 - 34.0 pg   MCHC 33.9 30.0 - 36.0 g/dL   RDW 12.9 11.5 - 15.5 %   Platelets 236 150 - 400 K/uL   nRBC 0.0 0.0 - 0.2 %    Comment: Performed at [facility], [place] [address] [place], [place] [address]  Surgical pcr screen     Status: None   Collection Time: 07/10/19 12:50 PM   Specimen: Nasal Mucosa; Nasal Swab  Result Value Ref Range   MRSA, PCR NEGATIVE NEGATIVE   Staphylococcus aureus NEGATIVE NEGATIVE    Comment: (NOTE) The Xpert SA Assay (FDA approved for NASAL specimens in patients  2 years of age and older), is one component of a comprehensive surveillance program. It is not intended to diagnose infection nor to guide or monitor treatment. Performed at [facility], [place] [address], [place], [place] [address]     Imaging / Studies: No results found.   Adin Hector, M.D., F.A.C.S. Gastrointestinal and Minimally Invasive Surgery [facility] [address] [place], [place] [address] [phone] Main / Paging  07/12/2019 10:51 AM    Adin Hector

## 2019-07-12 NOTE — Anesthesia Preprocedure Evaluation (Addendum)
Anesthesia Evaluation  Patient identified by MRN, date of birth, ID band Patient awake    Reviewed: Allergy & Precautions, NPO status , Patient's Chart, lab work & pertinent test results  Airway Mallampati: I  TM Distance: >3 FB Neck ROM: Full    Dental  (+) Edentulous Upper, Missing   Pulmonary sleep apnea , former smoker,    Pulmonary exam normal breath sounds clear to auscultation       Cardiovascular hypertension, Pt. on medications + CAD, + Past MI and + Cardiac Stents  Normal cardiovascular exam Rhythm:Regular Rate:Normal  ECG: SB, rate 58  Pre-op eval per cardiology, Dr. Dorris Carnes, 05/24/2019   Neuro/Psych negative neurological ROS  negative psych ROS   GI/Hepatic negative GI ROS, Neg liver ROS,   Endo/Other  negative endocrine ROS  Renal/GU negative Renal ROS     Musculoskeletal negative musculoskeletal ROS (+)   Abdominal (+) + obese,   Peds  Hematology HLD   Anesthesia Other Findings PROXIMAL RECTAL POLYP  Reproductive/Obstetrics                           Anesthesia Physical Anesthesia Plan  ASA: III  Anesthesia Plan: General   Post-op Pain Management:    Induction: Intravenous  PONV Risk Score and Plan: 4 or greater and Midazolam, Dexamethasone, Ondansetron and Treatment may vary due to age or medical condition  Airway Management Planned: Oral ETT  Additional Equipment:   Intra-op Plan:   Post-operative Plan: Extubation in OR  Informed Consent: I have reviewed the patients History and Physical, chart, labs and discussed the procedure including the risks, benefits and alternatives for the proposed anesthesia with the patient or authorized representative who has indicated his/her understanding and acceptance.     Dental advisory given  Plan Discussed with: CRNA  Anesthesia Plan Comments:        Anesthesia Quick Evaluation

## 2019-07-12 NOTE — Plan of Care (Signed)
Plan of care 

## 2019-07-12 NOTE — Op Note (Signed)
07/12/2019  2:58 PM  PATIENT:  Tina [name]  74 y.o. female  Patient Care Team: Carollee Herter, Alferd Apa, DO as PCP - General Adron Bene, DDS as Consulting Physician (Dentistry) Jerline Pain Mingo Amber, DO as Consulting Physician (Optometry) Fay Records, MD as Consulting Physician (Cardiology) Jacelyn Pi, MD as Referring Physician (Endocrinology) [name] Kirke Corin, MD as Consulting Physician (Gastroenterology) Michael Boston, MD as Consulting Physician (General Surgery)  PRE-OPERATIVE DIAGNOSIS:  PROXIMAL RECTAL POLYP  POST-OPERATIVE DIAGNOSIS:  MID RECTAL POLYP  PROCEDURE:   XI ROBOT ASSISTED LOW ANTERIOR  RECTOSIGMOID RESECTION  BILATERAL TAP BLOCK ASSESSMENT OF TISSUE PERFUSION USING FIREFLY IMMUNOFLUORESCENCE RIGID PROCTOSCOPY  SURGEON:  Adin Hector, MD  ASSISTANT: Carlena Hurl, PA-C An experienced assistant was required given the standard of surgical care given the complexity of the case.  This assistant was needed for exposure, dissection, suctioning, retraction, instrument exchange, etc.  ANESTHESIA:     General  Nerve block provided with liposomal bupivacaine (Experel) mixed with 0.25% bupivacaine as a Bilateral TAP block x 71mL each side at the level of the transverse abdominis & preperitoneal spaces along the flank at the anterior axillary line, from subcostal ridge to iliac crest under laparoscopic guidance   Local field block at port sites & extraction wound  EBL:  Total I/O In: 1100 [I.V.:1000; IV Piggyback:100] Out: 375 [Urine:350; Blood:25]  Delay start of Pharmacological VTE agent (>24hrs) due to surgical blood loss or risk of bleeding:  no  DRAINS: No  SPECIMEN:   RECTOSIGMOID COLON (open end proximal) DISTAL ANASTOMOTIC RING (final distal margin)  DISPOSITION OF SPECIMEN:  PATHOLOGY  COUNTS:  YES  PLAN OF CARE: Admit to inpatient   PATIENT DISPOSITION:  PACU - hemodynamically stable.  INDICATION:    Patient found to have bulky polyp in  proximal/mid rectum.  Not amenable to endoscopic resection.  I recommended segmental resection:  The anatomy & physiology of the digestive tract was discussed.  The pathophysiology was discussed.  Natural history risks without surgery was discussed.   I worked to give an overview of the disease and the frequent need to have multispecialty involvement.  I feel the risks of no intervention will lead to serious problems that outweigh the operative risks; therefore, I recommended a partial colectomy to remove the pathology.  Laparoscopic & open techniques were discussed.   Risks such as bleeding, infection, abscess, leak, reoperation, possible ostomy, hernia, heart attack, death, and other risks were discussed.  I noted a good likelihood this will help address the problem.   Goals of post-operative recovery were discussed as well.  We will work to minimize complications.  Educational materials on the pathology had been given in the office.  Questions were answered.    The patient expressed understanding & wished to proceed with surgery.  OR FINDINGS:   Patient had bulky polyp in the proximal rectum, about 13 cm from the anal verge.  No obvious metastatic disease on visceral parietal peritoneum or liver.  The anastomosis rests 10 cm from the anal verge by rigid proctoscopy.  DESCRIPTION:   Informed consent was confirmed.  The patient underwent general anaesthesia without difficulty.  The patient was positioned appropriately.  VTE prevention in place.  Surgical timeout confirmed our plan.  I did digital rectal examination and rigid proctoscopy.  I confirmed that there was a polypoid mass on the most proximal rectal fold quite large.  13 cm from the anal verge by rigid proctoscopy eventually could get a proctoscope proximal  to it.  There was tattooing distal and proximal to it  The patient was clipped, prepped, & draped in a sterile fashion.  Surgical timeout confirmed our plan.  The patient was  positioned in reverse Trendelenburg.  Abdominal entry was gained using Varess technique at the left subcostal ridge on the anterior abdominal wall.  No elevated EtCO2 noted.  Port placed.  Camera inspection revealed no injury.  Extra ports were carefully placed under direct laparoscopic visualization.  I reflected the greater omentum and the upper abdomen the small bowel in the upper abdomen.  The patient was carefully positioned.  The Intuitive daVinci robot was docked with camera & instruments carefully placed.  The patient had tattooing in the proximal & mid rectum a low peritoneal reflection.  Redundant torturous sigmoid colon I mobilized some attachments on the sigmoid colon to the lateral medial fashion to help straighten it out.  I scored the base of peritoneum of the medial side of the mesentery of the elevated left colon from the ligament of Treitz to the peritoneal reflection of the mid rectum.   I elevated the sigmoid mesentery and entered into the retro-mesenteric plane. We were able to identify the left ureter and gonadal vessels. We kept those posterior within the retroperitoneum and elevated the left colon mesentery off that. I did isolate the inferior mesenteric artery (IMA) pedicle but did not ligate it yet.  I continued distally and got into the avascular plane posterior to the mesorectum. This allowed me to help mobilize the rectum as well by freeing the mesorectum off the sacrum.  I mobilized the peritoneal coverings towards the peritoneal reflection on both the right and left sides of the rectum.  I stayed away from the right and left ureters.  I kept the lateral vascular pedicles to the rectum intact.  I skeletonized the lymph nodes off the inferior mesenteric artery pedicle.  I went down to its takeoff from the aorta.  I isolated the inferior mesenteric vein off of the ligament of Treitz just cephalad to that as well.  After confirming the left ureter was out of the way, I went ahead and  ligated the inferior mesenteric artery pedicle just near its takeoff from the aorta.  I did ligate the inferior mesenteric vein in a similar fashion.  We ensured hemostasis.  We did complete mobilization off the retroperitoneum up towards the splenic flexure into medial lateral and lateral medial fashion.  Got excellent reach down to the pelvis.  I then focused on mesorectal excision.  Elevated the proximal off the presacral space and came down more distally to do good posterior rectal mobilization to the distal rectum.  Then transected through the peritoneal reflections of the lateral pedicles again across the low peritoneal reflection at the rectovaginal septum.  This helped on the data 5 through 12.  Felt I was distal to the area of concern.  I repeated rigid proctoscopy confirmed that the polyp was at the proximal/mid rectal junction.   I skeletonized the mesorectum at the mid rectum for the distal point of resection.   I transected the mesentery of the colon radially to preserve remaining colon blood supply. With her advanced age and thinned out mesentery, I assessed tissue manipulations.  We asked anesthesia to dilute the indocyanine green (ICG) to 10 mL and inject 3 mL intravenously with IV flush.  I switched to the NIR fluorescence (Firefly mode) imaging window on the daVinci platform.  I was able to see good light green  visualization of blood vessels with good perfusion of tissues, confirming good tissue perfusion of both descending colon and mid rectum ends planned for anastomosis.I transected at the mid rectum using a robotic 60 mm green load stapler.  I chose a region at the descending/sigmoid junction that was soft and easily reached down to the rectal stump.    I created an extraction incision through a small Pfannenstiel incision in the suprapubic region.  Placed a wound protector.  I was able to eviscerate the rectosigmoid and descending colon out the wound.   I clamped the colon proximal to  this area using a reusable pursestringer device.  Passed a 2-0 Keith needle. I transected at the descending/sigmoid junction with a scalpel. I got healthy bleeding mucosa.  We sent the rectosigmoid colon specimen off to go to pathology.  We sized the colon orifice.  I chose a 31 EEA anvil stapler system. Initial pursestring did not lay well so we repeated placing a pursestring.    I reinforced the prolene pursestring with interrupted silk suture.  I placed the anvil to the open end of the proximal remaining colon and closed around it using the pursestring.    We did copious irrigation with crystalloid solution.  Hemostasis was good.  The distal end of the remaining colon easily reached down to the rectal stump, therefore, splenic flexure mobilization was not needed.      Ms Lemar Lofty scrubbed down and did gentle anal dilation and advanced the EEA stapler up the rectal stump. The spike was brought out at the provimal end of the rectal stump under direct visualization.  I attached the anvil of the proximal colon the spike of the stapler. Anvil was tightened down and held clamped for 60 seconds. The EEA stapler was fired and held clamped for 30 seconds. The stapler was released & removed. We noted 2 excellent anastomotic rings. Blue stitch is in the proximal ring.  Ms Lemar Lofty did rigid proctoscopy while I used irrigation & clamped colon that for the pelvic air leak test .  The rectum was insufflated the rectum while clamping the colon proximal to that anastomosis.  There was a negative air leak test. There was no tension of mesentery or bowel at the anastomosis.   Tissues looked viable.  Ureters & bowel uninjured.  The anastomosis looked healthy.  Endoluminal gas was evacuated.  Ports & wound protector removed.  We changed gloves & redraped the patient per colon SSI prevention protocol.  We aspirated the antibiotic irrigation.  Hemostasis was good.  Sterile unused instruments were used from this point.  I closed the skin  at the port sites using Monocryl stitch and sterile dressing.  I closed the extraction wound using a 0 Vicryl vertical peritoneal closure and a #1 PDS transverse anterior rectal fascial closure like a small Pfannenstiel closure. I closed the skin with some interrupted Monocryl stitches. I placed antibiotic-soaked wicks into the closure at the corners x2.  I placed sterile dressings.  Proctoscopy deferred anastomosis at 10 cm from the anal verge with no active bleeding at the staple line.  Mucosa viable.  Patient is being extubated go to recovery room. I had discussed postop care with the patient in detail the office & in the holding area. Instructions are written. I discussed operative findings, updated the patient's status, discussed probable steps to recovery, and gave postoperative recommendations to the patient's daughter, Boneta Lucks.  Recommendations were made.  Questions were answered.  She expressed understanding & appreciation. Marland Kitchen  Adin Hector, M.D., F.A.C.S. Gastrointestinal and Minimally Invasive Surgery [facility] [address] [place], [place] [address] [phone] Main / Paging

## 2019-07-12 NOTE — Transfer of Care (Signed)
Immediate Anesthesia Transfer of Care Note  Patient: Tina [name]  Procedure(s) Performed: XI ROBOT ASSISTED LOW ANTERIOR  RESECTION OF RECTOSIGMOID COLON. BILATERAL TAP BLOCK, ASSESSMENT OF TISSUE PERFUSION USIMG FIREFLY ( IMMUNOFLUORESCENCE) (N/A Abdomen) RIGID PROCTOSCOPY (N/A )  Patient Location: PACU  Anesthesia Type:General  Level of Consciousness: awake, alert  and oriented  Airway & Oxygen Therapy: Patient Spontanous Breathing and Patient connected to face mask oxygen  Post-op Assessment: Report given to RN, Post -op Vital signs reviewed and stable and Patient moving all extremities X 4  Post vital signs: Reviewed and stable  Last Vitals:  Vitals Value Taken Time  BP    Temp    Pulse 72 07/12/19 1505  Resp 25 07/12/19 1505  SpO2 100 % 07/12/19 1505  Vitals shown include unvalidated device data.  Last Pain:  Vitals:   07/12/19 1040  TempSrc: Oral         Complications: No apparent anesthesia complications

## 2019-07-12 NOTE — Anesthesia Procedure Notes (Signed)
Procedure Name: Intubation Date/Time: 07/12/2019 11:44 AM Performed by: Niel Hummer, CRNA Pre-anesthesia Checklist: Patient identified, Emergency Drugs available, Suction available and Patient being monitored Patient Re-evaluated:Patient Re-evaluated prior to induction Oxygen Delivery Method: Circle system utilized Preoxygenation: Pre-oxygenation with 100% oxygen Induction Type: IV induction and Rapid sequence Laryngoscope Size: Mac and 4 Grade View: Grade I Tube type: Oral Tube size: 7.0 mm Number of attempts: 1 Airway Equipment and Method: Stylet Placement Confirmation: ETT inserted through vocal cords under direct vision,  positive ETCO2 and breath sounds checked- equal and bilateral Secured at: 20 cm Tube secured with: Tape Dental Injury: Teeth and Oropharynx as per pre-operative assessment

## 2019-07-12 NOTE — Anesthesia Postprocedure Evaluation (Signed)
Anesthesia Post Note  Patient: Nashali E Newcom  Procedure(s) Performed: XI ROBOT ASSISTED LOW ANTERIOR  RESECTION OF RECTOSIGMOID COLON. BILATERAL TAP BLOCK, ASSESSMENT OF TISSUE PERFUSION USIMG FIREFLY ( IMMUNOFLUORESCENCE) (N/A Abdomen) RIGID PROCTOSCOPY (N/A )     Patient location during evaluation: PACU Anesthesia Type: General Level of consciousness: awake and alert Pain management: pain level controlled Vital Signs Assessment: post-procedure vital signs reviewed and stable Respiratory status: spontaneous breathing, nonlabored ventilation, respiratory function stable and patient connected to nasal cannula oxygen Cardiovascular status: blood pressure returned to baseline and stable Postop Assessment: no apparent nausea or vomiting Anesthetic complications: no    Last Vitals:  Vitals:   07/12/19 1600 07/12/19 1618  BP: 136/69 (!) 141/67  Pulse: 71 76  Resp: 15 16  Temp: (!) 36.3 C (!) 36.3 C  SpO2: 100% 100%    Last Pain:  Vitals:   07/12/19 1618  TempSrc:   PainSc: 3                  Eliyanah Elgersma P Yamira Papa

## 2019-07-13 LAB — CBC
HCT: 33.1 % — ABNORMAL LOW (ref 36.0–46.0)
Hemoglobin: 11.4 g/dL — ABNORMAL LOW (ref 12.0–15.0)
MCH: 31.4 pg (ref 26.0–34.0)
MCHC: 34.4 g/dL (ref 30.0–36.0)
MCV: 91.2 fL (ref 80.0–100.0)
Platelets: 200 10*3/uL (ref 150–400)
RBC: 3.63 MIL/uL — ABNORMAL LOW (ref 3.87–5.11)
RDW: 12.9 % (ref 11.5–15.5)
WBC: 8.5 10*3/uL (ref 4.0–10.5)
nRBC: 0 % (ref 0.0–0.2)

## 2019-07-13 LAB — BASIC METABOLIC PANEL
Anion gap: 7 (ref 5–15)
BUN: 7 mg/dL — ABNORMAL LOW (ref 8–23)
CO2: 28 mmol/L (ref 22–32)
Calcium: 8.6 mg/dL — ABNORMAL LOW (ref 8.9–10.3)
Chloride: 100 mmol/L (ref 98–111)
Creatinine, Ser: 0.65 mg/dL (ref 0.44–1.00)
GFR calc Af Amer: >60 mL/min (ref >60)
GFR calc non Af Amer: >60 mL/min (ref >60)
Glucose, Bld: 139 mg/dL — ABNORMAL HIGH (ref 70–99)
Potassium: 3.6 mmol/L (ref 3.5–5.1)
Sodium: 135 mmol/L (ref 135–145)

## 2019-07-13 LAB — MAGNESIUM: Magnesium: 1.7 mg/dL (ref 1.7–2.4)

## 2019-07-13 NOTE — Progress Notes (Signed)
Tina [name] [id number] 1945-11-14  CARE TEAM:  PCP: Ann Held, DO  Outpatient Care Team: Patient Care Team: Carollee Herter, Alferd Apa, DO as PCP - General Adron Bene, DDS as Consulting Physician (Dentistry) Jerline Pain Mingo Amber, DO as Consulting Physician (Optometry) Fay Records, MD as Consulting Physician (Cardiology) Jacelyn Pi, MD as Referring Physician (Endocrinology) [name] Kirke Corin, MD as Consulting Physician (Gastroenterology) Michael Boston, MD as Consulting Physician (General Surgery)  Inpatient Treatment Team: Treatment Team: Attending Provider: Michael Boston, MD; [place] Nurse: Jeannie Fend, RN; Registered Nurse: Clarisa Kindred, RN; Technician: Leda Quail, NT; Utilization Review: Micah Noel, RN   Problem List:   Principal Problem:   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  History of normal hemoglobin A1c.  Most likely related to perioperative steroid dose.  Follow. -Foley removed. -Hypertension control -Hyperlipidemia control -VTE prophylaxis- SCDs, etc -mobilize as tolerated to help recovery  25 minutes spent in review, evaluation, examination, counseling, and coordination of care.  More than 50% of that time was spent in counseling.  I updated the patient's status to the patient.  Recommendations were made.  Questions were answered.  She expressed understanding & appreciation.   07/13/2019     Subjective: (Chief complaint)  Walked in hallway.  Denies much pain.  Mild abdominal crampiness relieved with loose bowel movement.  In good spirits.  Objective:  Vital signs:  Vitals:   07/12/19 2119 07/13/19 0143 07/13/19 0514 07/13/19 0536  BP: (!) 124/110 105/66  (!) 127/56  Pulse: 95 81  76  Resp: 16 16  16   Temp: 97.8 F (36.6 C) 97.7 F (36.5 C)  97.7 F (36.5 C)  TempSrc: Oral Oral  Oral  SpO2: 99% 100%  100%  Weight:   88.2 kg   Height:        Last BM Date: 07/12/19  Intake/Output   Yesterday:  02/10 0701 - 02/11 0700 In: 3165.7 [P.O.:840; I.V.:2225.7; IV Piggyback:100] Out: 2875 [Urine:2850; Blood:25] This shift:  No intake/output data recorded.  Bowel function:  Flatus: YES  BM:  YES  Drain: (No drain)   Physical Exam:  General: Pt awake/alert in no acute distress.  Smiling.  Playing game on her cell phone. Eyes: PERRL, normal EOM.  Sclera clear.  No icterus Neuro: CN II-XII intact w/o focal sensory/motor deficits. Lymph: No head/neck/groin lymphadenopathy Psych:  No delerium/psychosis/paranoia.  Oriented x 4 HENT: Normocephalic, Mucus membranes moist.  No thrush Neck: Supple, No tracheal deviation.  No obvious thyromegaly Chest: No pain to chest wall compression.  Good respiratory excursion.  No audible wheezing CV:  Pulses intact.  Regular rhythm.  No major extremity edema MS: Normal AROM mjr joints.  No obvious deformity  Abdomen: Soft.  Nondistended.  Mildly tender at incisions only.  No evidence of peritonitis.  No incarcerated hernias.  Ext:  No deformity.  No mjr edema.  No cyanosis Skin: No petechiae / purpurea.  No major sores.  Warm and dry  Results:   Cultures: Recent Results (from the past 720 hour(s))  SARS CORONAVIRUS 2 (TAT 6-24 HRS) Nasopharyngeal Nasopharyngeal Swab     Status: None   Collection Time: 07/08/19 10:33 AM   Specimen: Nasopharyngeal Swab  Result Value Ref Range Status   SARS Coronavirus 2 NEGATIVE  NEGATIVE Final    Comment: (NOTE) SARS-CoV-2 target nucleic acids are NOT DETECTED. The SARS-CoV-2 RNA is generally detectable in upper and lower respiratory specimens during the acute phase of infection. Negative results do not preclude SARS-CoV-2 infection, do not rule out co-infections with other pathogens, and should not be used as the sole basis for treatment or other patient management decisions. Negative results must be combined with clinical observations, patient history, and epidemiological information. The expected result is Negative. Fact Sheet for Patients: SugarRoll.be Fact Sheet for Healthcare Providers: https://www.woods-mathews.com/ This test is not yet approved or cleared by the [place] FDA and  has been authorized for detection and/or diagnosis of SARS-CoV-2 by FDA under an Emergency Use Authorization (EUA). This EUA will remain  in effect (meaning this test can be used) for the duration of the COVID-19 declaration under Section 56 4(b)(1) of the Act, 21 U.S.C. section 360bbb-3(b)(1), unless the authorization is terminated or revoked sooner. Performed at [facility], [place] [address] [place], [place] [address]   Surgical pcr screen     Status: None   Collection Time: 07/10/19 12:50 PM   Specimen: Nasal Mucosa; Nasal Swab  Result Value Ref Range Status   MRSA, PCR NEGATIVE NEGATIVE Final   Staphylococcus aureus NEGATIVE NEGATIVE Final    Comment: (NOTE) The Xpert SA Assay (FDA approved for NASAL specimens in patients 28 years of age and older), is one component of a comprehensive surveillance program. It is not intended to diagnose infection nor to guide or monitor treatment. Performed at [facility], [place] [address], [place], [place] [address]     Labs: Results for orders placed or performed during the hospital encounter of 07/12/19 (from the past 48 hour(s))  Basic metabolic panel      Status: Abnormal   Collection Time: 07/13/19  3:37 AM  Result Value Ref Range   Sodium 135 135 - 145 mmol/L   Potassium 3.6 3.5 - 5.1 mmol/L   Chloride 100 98 - 111 mmol/L   CO2 28 22 - 32 mmol/L   Glucose, Bld 139 (H) 70 - 99 mg/dL   BUN 7 (L) 8 - 23 mg/dL   Creatinine, Ser 0.65 0.44 - 1.00 mg/dL   Calcium 8.6 (L) 8.9 - 10.3 mg/dL   GFR calc non Af Amer >60 >60 mL/min   GFR calc Af Amer >60 >60 mL/min   Anion gap 7 5 - 15    Comment: Performed at [facility], [place] [address], [place], [place] [address]  CBC     Status: Abnormal   Collection Time: 07/13/19  3:37 AM  Result Value Ref Range   WBC 8.5 4.0 - 10.5 K/uL   RBC 3.63 (L) 3.87 - 5.11 MIL/uL   Hemoglobin 11.4 (L) 12.0 - 15.0 g/dL   HCT 33.1 (L) 36.0 - 46.0 %   MCV 91.2 80.0 - 100.0 fL   MCH 31.4 26.0 - 34.0 pg   MCHC 34.4 30.0 - 36.0 g/dL   RDW 12.9 11.5 - 15.5 %   Platelets 200 150 - 400 K/uL   nRBC 0.0 0.0 - 0.2 %    Comment: Performed at [facility], [place] [address], [place], [place] [address]  Magnesium     Status: None   Collection Time: 07/13/19  3:37 AM  Result Value Ref Range   Magnesium 1.7 1.7 - 2.4 mg/dL    Comment: Performed at [facility], [place] [address], [place], [place] [address]    Imaging / Studies: No results found.  Medications / Allergies: per chart  Antibiotics: Anti-infectives (From admission, onward)   Start     Dose/Rate Route Frequency Ordered Stop   07/12/19 2359  cefoTEtan (CEFOTAN) 2 g in sodium chloride 0.9 % 100 mL IVPB     2 g 200 mL/hr over 30 Minutes Intravenous Every 12 hours 07/12/19 1618 07/13/19 0103   07/12/19 1400  neomycin (MYCIFRADIN) tablet 1,000 mg  Status:  Discontinued     1,000 mg Oral 3 times per day 07/12/19 1026 07/12/19 1030   07/12/19 1400  metroNIDAZOLE (FLAGYL) tablet 1,000 mg  Status:  Discontinued     1,000 mg Oral 3 times per day 07/12/19 1026 07/12/19 1030   07/12/19 1030  cefoTEtan (CEFOTAN) 2 g  in sodium chloride 0.9 % 100 mL IVPB     2 g 200 mL/hr over 30 Minutes Intravenous On call to O.R. 07/12/19 1026 07/12/19 1214   07/12/19 0600  clindamycin (CLEOCIN) 900 mg, gentamicin (GARAMYCIN) 240 mg in sodium chloride 0.9 % 1,000 mL for intraperitoneal lavage  Status:  Discontinued      Irrigation To Surgery 07/11/19 0746 07/12/19 1610        Note: Portions of this report may have been transcribed using voice recognition software. Every effort was made to ensure accuracy; however, inadvertent computerized transcription errors may be present.   Any transcriptional errors that result from this process are unintentional.     Adin Hector, MD, FACS, MASCRS Gastrointestinal and Minimally Invasive Surgery    [address], [place], [place] [address] [phone] Main / Paging [phone] Fax Please see Amion for pager number, especial 5pm - 7am.

## 2019-07-13 NOTE — Progress Notes (Signed)
Patient ambulated hall and sat in chair. No nausea overnight.

## 2019-07-14 LAB — SURGICAL PATHOLOGY

## 2019-07-14 NOTE — Plan of Care (Signed)
Patient discharged home in stable condition 

## 2019-07-14 NOTE — Discharge Summary (Signed)
Physician Discharge Summary    Patient ID: Tina [name] MRN: [id number] DOB/AGE: 02/03/1946  74 y.o.  Patient Care Team: Carollee Herter, Alferd Apa, DO as PCP - General Adron Bene, DDS as Consulting Physician (Dentistry) Jerline Pain Mingo Amber, DO as Consulting Physician (Optometry) Fay Records, MD as Consulting Physician (Cardiology) Jacelyn Pi, MD as Referring Physician (Endocrinology) [name] Kirke Corin, MD as Consulting Physician (Gastroenterology) Michael Boston, MD as Consulting Physician (General Surgery)  Admit date: 07/12/2019  Discharge date: 07/14/2019  Hospital Stay = 2 days    Discharge Diagnoses:  Principal Problem:   Adenomatous polyp s/p LAR rectosigmoid resection 07/12/2019 Active Problems:   Hyperlipidemia   Essential hypertension   Obesity (BMI 30-39.9)   2 Days Post-Op  07/12/2019  POST-OPERATIVE DIAGNOSIS:  MID RECTAL POLYP  PROCEDURE:   XI ROBOT ASSISTED LOW ANTERIOR  RECTOSIGMOID RESECTION  BILATERAL TAP BLOCK ASSESSMENT OF TISSUE PERFUSION USING FIREFLY IMMUNOFLUORESCENCE RIGID PROCTOSCOPY  SURGEON:  Adin Hector, MD  OR FINDINGS:   Patient had bulky polyp in the proximal rectum, about 13 cm from the anal verge.  No obvious metastatic disease on visceral parietal peritoneum or liver.  The anastomosis rests 10 cm from the anal verge by rigid proctoscopy.  Consults: None  Hospital Course:   The patient underwent the surgery above.  Postoperatively, the patient gradually mobilized and advanced to a solid diet.  Pain and other symptoms were treated aggressively.    By the time of discharge, the patient was walking well the hallways, eating food, having flatus.  Pain was well-controlled on an oral medications.  Based on meeting discharge criteria and continuing to recover, I felt it was safe for the patient to be discharged from the hospital to further recover with close followup. Postoperative recommendations were discussed in detail.   They are written as well.  Discharged Condition: good  Discharge Exam: Blood pressure 108/65, pulse 69, temperature (!) 97.5 F (36.4 C), temperature source Oral, resp. rate 16, height 5\' 5"  (1.651 m), weight 85.5 kg, SpO2 98 %.  General: Pt awake/alert/oriented x4 in No acute distress Eyes: PERRL, normal EOM.  Sclera clear.  No icterus Neuro: CN II-XII intact w/o focal sensory/motor deficits. Lymph: No head/neck/groin lymphadenopathy Psych:  No delerium/psychosis/paranoia HENT: Normocephalic, Mucus membranes moist.  No thrush Neck: Supple, No tracheal deviation Chest: No chest wall pain w good excursion CV:  Pulses intact.  Regular rhythm MS: Normal AROM mjr joints.  No obvious deformity Abdomen: Soft.  Nondistended.  Nontender.  Dressings clean & dry.  No evidence of peritonitis.  No incarcerated hernias. Ext:  SCDs BLE.  No mjr edema.  No cyanosis Skin: No petechiae / purpura   Disposition:   Follow-up Information    Michael Boston, MD. Schedule an appointment as soon as possible for a visit in 3 weeks.   Specialty: General Surgery Why: To follow up after your operation, To follow up after your hospital stay Contact information: [place] [id number] [phone]           Discharge disposition: 01-Home or Self Care       Discharge Instructions    Call MD for:   Complete by: As directed    FEVER > 101.5 F  (temperatures < 101.5 F are not significant)   Call MD for:   Complete by: As directed    FEVER > 101.5 F  (temperatures < 101.5 F are not significant)   Call MD for:  extreme fatigue   Complete by: As directed    Call MD for:  extreme fatigue   Complete by: As directed    Call MD for:  persistant dizziness or light-headedness   Complete by: As directed    Call MD for:  persistant dizziness or light-headedness   Complete by: As directed    Call MD for:  persistant nausea and vomiting   Complete by: As directed    Call MD  for:  persistant nausea and vomiting   Complete by: As directed    Call MD for:  redness, tenderness, or signs of infection (pain, swelling, redness, odor or green/yellow discharge around incision site)   Complete by: As directed    Call MD for:  redness, tenderness, or signs of infection (pain, swelling, redness, odor or green/yellow discharge around incision site)   Complete by: As directed    Call MD for:  severe uncontrolled pain   Complete by: As directed    Call MD for:  severe uncontrolled pain   Complete by: As directed    Diet - low sodium heart healthy   Complete by: As directed    Start with a bland diet such as soups, liquids, starchy foods, low fat foods, etc. the first few days at home. Gradually advance to a solid, low-fat, high fiber diet by the end of the first week at home.   Add a fiber supplement to your diet (Metamucil, etc) If you feel full, bloated, or constipated, stay on a full liquid or pureed/blenderized diet for a few days until you feel better and are no longer constipated.   Discharge instructions   Complete by: As directed    See Discharge Instructions If you are not getting better after two weeks or are noticing you are getting worse, contact our office [phone] for further advice.  We may need to adjust your medications, re-evaluate you in the office, send you to the emergency room, or see what other things we can do to help. The clinic staff is available to answer your questions during regular business hours (8:30am-5pm).  Please don't hesitate to call and ask to speak to one of our nurses for clinical concerns.    A surgeon from [facility] is always on call at the hospitals 24 hours/day If you have a medical emergency, go to the nearest emergency room or call 911.   Discharge wound care:   Complete by: As directed    It is good for closed incisions and even open wounds to be washed every day.  Shower every day.  Short baths are fine.   Wash the incisions and wounds clean with soap & water.    You may leave closed incisions open to air if it is dry.   You may cover the incision with clean gauze & replace it after your daily shower for comfort.   Driving Restrictions   Complete by: As directed    You may drive when: - you are no longer taking narcotic prescription pain medication - you can comfortably wear a seatbelt - you can safely make sudden turns/stops without pain.   Increase activity slowly   Complete by: As directed    Start light daily activities --- self-care, walking, climbing stairs- beginning the day after surgery.  Gradually increase activities as tolerated.  Control your pain to be active.  Stop when you are tired.  Ideally, walk several times a day, eventually an hour a day.  Most people are back to most day-to-day activities in a few weeks.  It takes 4-6 weeks to get back to unrestricted, intense activity. If you can walk 30 minutes without difficulty, it is safe to try more intense activity such as jogging, treadmill, bicycling, low-impact aerobics, swimming, etc. Save the most intensive and strenuous activity for last (Usually 4-8 weeks after surgery) such as sit-ups, heavy lifting, contact sports, etc.  Refrain from any intense heavy lifting or straining until you are off narcotics for pain control.  You will have off days, but things should improve week-by-week. DO NOT PUSH THROUGH PAIN.  Let pain be your guide: If it hurts to do something, don't do it.   Lifting restrictions   Complete by: As directed    If you can walk 30 minutes without difficulty, it is safe to try more intense activity such as jogging, treadmill, bicycling, low-impact aerobics, swimming, etc. Save the most intensive and strenuous activity for last (Usually 4-8 weeks after surgery) such as sit-ups, heavy lifting, contact sports, etc.   Refrain from any intense heavy lifting or straining until you are off narcotics for pain control.  You  will have off days, but things should improve week-by-week. DO NOT PUSH THROUGH PAIN.  Let pain be your guide: If it hurts to do something, don't do it.  Pain is your body warning you to avoid that activity for another week until the pain goes down.   May shower / Bathe   Complete by: As directed    May walk up steps   Complete by: As directed    Remove dressing in 72 hours   Complete by: As directed    Make sure all dressings are removed by the third day after surgery = 2/13 SATURDAY.  Leave incisions open to air.  OK to cover incisions with gauze or bandages as desired   Sexual Activity Restrictions   Complete by: As directed    You may have sexual intercourse when it is comfortable. If it hurts to do something, stop.      Allergies as of 07/14/2019   No Known Allergies     Medication List    TAKE these medications   alendronate 70 MG tablet Commonly known as: FOSAMAX TAKE ONE TABLET BY MOUTH ONCE WEEKLY ON EMPTY STOMACH WITH FULL GLASS OF WATER   aspirin 81 MG tablet Take 81 mg by mouth daily.   CALCIUM 1200 PO Take 1,200 mg by mouth daily. 1 tab po qd   Fish Oil 1000 MG Caps Take 2,000 mg by mouth daily.   multivitamin capsule Take 1 capsule by mouth daily.   ramipril 5 MG capsule Commonly known as: ALTACE TAKE 1 CAPSULE(5 MG) BY MOUTH DAILY What changed: See the new instructions.   rosuvastatin 20 MG tablet Commonly known as: CRESTOR Take 0.5 tablets (10 mg total) by mouth daily.   traMADol 50 MG tablet Commonly known as: ULTRAM Take 1-2 tablets (50-100 mg total) by mouth every 6 (six) hours as needed for moderate pain or severe pain.            Discharge Care Instructions  (From admission, onward)         Start     Ordered   07/12/19 0000  Discharge wound care:    Comments: It is good for closed incisions and even open wounds to be washed every day.  Shower every day.  Short baths are fine.  Wash the incisions and wounds clean  with soap & water.     You may leave closed incisions open to air if it is dry.   You may cover the incision with clean gauze & replace it after your daily shower for comfort.   07/12/19 1054          Significant Diagnostic Studies:  Results for orders placed or performed during the hospital encounter of 07/12/19 (from the past 72 hour(s))  Basic metabolic panel     Status: Abnormal   Collection Time: 07/13/19  3:37 AM  Result Value Ref Range   Sodium 135 135 - 145 mmol/L   Potassium 3.6 3.5 - 5.1 mmol/L   Chloride 100 98 - 111 mmol/L   CO2 28 22 - 32 mmol/L   Glucose, Bld 139 (H) 70 - 99 mg/dL   BUN 7 (L) 8 - 23 mg/dL   Creatinine, Ser 0.65 0.44 - 1.00 mg/dL   Calcium 8.6 (L) 8.9 - 10.3 mg/dL   GFR calc non Af Amer >60 >60 mL/min   GFR calc Af Amer >60 >60 mL/min   Anion gap 7 5 - 15    Comment: Performed at [facility], [place] [address] [place], [place] [address]  CBC     Status: Abnormal   Collection Time: 07/13/19  3:37 AM  Result Value Ref Range   WBC 8.5 4.0 - 10.5 K/uL   RBC 3.63 (L) 3.87 - 5.11 MIL/uL   Hemoglobin 11.4 (L) 12.0 - 15.0 g/dL   HCT 33.1 (L) 36.0 - 46.0 %   MCV 91.2 80.0 - 100.0 fL   MCH 31.4 26.0 - 34.0 pg   MCHC 34.4 30.0 - 36.0 g/dL   RDW 12.9 11.5 - 15.5 %   Platelets 200 150 - 400 K/uL   nRBC 0.0 0.0 - 0.2 %    Comment: Performed at [facility], [place] [address], [place], [place] [address]  Magnesium     Status: None   Collection Time: 07/13/19  3:37 AM  Result Value Ref Range   Magnesium 1.7 1.7 - 2.4 mg/dL    Comment: Performed at [facility], [place] [address] [place], [place] [address]    No results found.  Past Medical History:  Diagnosis Date  . Allergy   . Cataract   . Fatty liver   . Goiter   . HTN (hypertension)   . Hyperlipidemia   . Myocardial infarct ([place]) hx of 2004  . Osteoporosis   . Sleep apnea    no cpap  . Wrist fracture     Past Surgical History:  Procedure Laterality Date  .  CARDIAC CATHETERIZATION  2004   stent  . PROCTOSCOPY N/A 07/12/2019   Procedure: RIGID PROCTOSCOPY;  Surgeon: Michael Boston, MD;  Location: [facility];  Service: General;  Laterality: N/A;  . sleep study  2012    Social History   Socioeconomic History  . Marital status: Divorced    Spouse name: Not on file  . Number of children: Not on file  . Years of education: Not on file  . Highest education level: Not on file  Occupational History  . Occupation: Optician, dispensing: OTHER    Comment: [employer]   Tobacco Use  . Smoking status: Former Smoker    Packs/day: 1.00    Years: 30.00    Pack years: 30.00    Quit date: 12/13/2002    Years since quitting: 16.5  . Smokeless tobacco: Former Systems developer  Quit date: 06/01/2002  Substance and Sexual Activity  . Alcohol use: Yes    Comment: 1-2 glasses of wine with dinner  . Drug use: No  . Sexual activity: Not Currently    Partners: Male  Other Topics Concern  . Not on file  Social History Narrative   Occupation:[employer]   Divorced   Former Smoker quit 2004   Drug use-no   Regular exercise-treadmill 3x a week   Originially from [employer] Strain:   . Difficulty of Paying Living Expenses: Not on file  Food Insecurity:   . Worried About Charity fundraiser in the Last Year: Not on file  . Ran Out of Food in the Last Year: Not on file  Transportation Needs:   . Lack of Transportation (Medical): Not on file  . Lack of Transportation (Non-Medical): Not on file  Physical Activity:   . Days of Exercise per Week: Not on file  . Minutes of Exercise per Session: Not on file  Stress:   . Feeling of Stress : Not on file  Social Connections:   . Frequency of Communication with Friends and Family: Not on file  . Frequency of Social Gatherings with Friends and Family: Not on file  . Attends Religious Services: Not on file  . Active Member of Clubs or Organizations: Not on file  .  Attends Archivist Meetings: Not on file  . Marital Status: Not on file  Intimate Partner Violence:   . Fear of Current or Ex-Partner: Not on file  . Emotionally Abused: Not on file  . Physically Abused: Not on file  . Sexually Abused: Not on file    Family History  Problem Relation Age of Onset  . Hypertension Mother   . Heart disease Mother 42       MI--- died from #3 at 56  . Ovarian cancer Mother   . Hypertension Father   . Cancer Father 77       ovarian  . Heart disease Father   . Hypertension Sister   . Cancer Sister 46       brain tumor  . Coronary artery disease Other   . Lung cancer Other   . Ovarian cancer Other   . Diabetes Maternal Grandmother   . Hypertension Brother   . Arthritis Brother   . Colon cancer Neg Hx   . Colon polyps Neg Hx   . Esophageal cancer Neg Hx   . Stomach cancer Neg Hx   . Rectal cancer Neg Hx     Current Facility-Administered Medications  Medication Dose Route Frequency Provider Last Rate Last Admin  . 0.9 %  sodium chloride infusion   Intravenous Q8H PRN Michael Boston, MD      . 0.9 %  sodium chloride infusion  250 mL Intravenous PRN Michael Boston, MD      . acetaminophen (TYLENOL) tablet 1,000 mg  1,000 mg Oral Lajuana Ripple, MD   1,000 mg at 07/14/19 0618  . alum & mag hydroxide-simeth (MAALOX/MYLANTA) 200-200-20 MG/5ML suspension 30 mL  30 mL Oral Q6H PRN Michael Boston, MD   30 mL at 07/12/19 2043  . alvimopan (ENTEREG) capsule 12 mg  12 mg Oral BID Michael Boston, MD   12 mg at 07/14/19 0815  . aspirin EC tablet 81 mg  81 mg Oral Daily Michael Boston, MD   81 mg at 07/14/19 0815  . diphenhydrAMINE (  BENADRYL) 12.5 MG/5ML elixir 12.5 mg  12.5 mg Oral Q6H PRN Michael Boston, MD       Or  . diphenhydrAMINE (BENADRYL) injection 12.5 mg  12.5 mg Intravenous Q6H PRN Michael Boston, MD      . enoxaparin (LOVENOX) injection 40 mg  40 mg Subcutaneous Q24H Michael Boston, MD   40 mg at 07/14/19 0814  . feeding supplement (ENSURE  SURGERY) liquid 237 mL  237 mL Oral BID BM Michael Boston, MD   Stopped at 07/13/19 1401  . gabapentin (NEURONTIN) capsule 200 mg  200 mg Oral TID Michael Boston, MD   200 mg at 07/14/19 0815  . HYDROmorphone (DILAUDID) injection 0.5-2 mg  0.5-2 mg Intravenous Q4H PRN Michael Boston, MD      . lip balm (CARMEX) ointment 1 application  1 application Topical BID Michael Boston, MD   1 application at [id number]  . magic mouthwash  15 mL Oral QID PRN Michael Boston, MD      . metoprolol tartrate (LOPRESSOR) injection 5 mg  5 mg Intravenous Q6H PRN Michael Boston, MD      . multivitamin with minerals tablet   Oral Daily Michael Boston, MD   1 tablet at 07/14/19 0815  . ondansetron (ZOFRAN) tablet 4 mg  4 mg Oral Q6H PRN Michael Boston, MD       Or  . ondansetron (ZOFRAN) injection 4 mg  4 mg Intravenous Q6H PRN Michael Boston, MD      . prochlorperazine (COMPAZINE) tablet 10 mg  10 mg Oral Q6H PRN Michael Boston, MD       Or  . prochlorperazine (COMPAZINE) injection 5-10 mg  5-10 mg Intravenous Q6H PRN Michael Boston, MD      . psyllium (HYDROCIL/METAMUCIL) packet 1 packet  1 packet Oral Daily Michael Boston, MD   1 packet at 07/14/19 0815  . ramipril (ALTACE) capsule 5 mg  5 mg Oral Daily Michael Boston, MD   5 mg at 07/14/19 0816  . rosuvastatin (CRESTOR) tablet 10 mg  10 mg Oral Daily Michael Boston, MD   10 mg at 07/14/19 0815  . sodium chloride flush (NS) 0.9 % injection 3 mL  3 mL Intravenous Gorden Harms, MD   3 mL at 07/13/19 2213  . sodium chloride flush (NS) 0.9 % injection 3 mL  3 mL Intravenous PRN Michael Boston, MD      . traMADol Veatrice Bourbon) tablet 50-100 mg  50-100 mg Oral Q6H PRN Michael Boston, MD         No Known Allergies  Signed: Morton Peters, MD, FACS, MASCRS Gastrointestinal and Minimally Invasive Surgery  [facility] [address], [place], [place] [address] [phone] Main / Paging [phone] Fax     07/14/2019, 9:38  AM

## 2019-07-14 NOTE — Plan of Care (Signed)
  Problem: Education: Goal: Knowledge of General Education information will improve Description: Including pain rating scale, medication(s)/side effects and non-pharmacologic comfort measures Outcome: Progressing   Problem: Clinical Measurements: Goal: Respiratory complications will improve Outcome: Progressing Goal: Cardiovascular complication will be avoided Outcome: Progressing   Problem: Activity: Goal: Risk for activity intolerance will decrease Outcome: Progressing   Problem: Nutrition: Goal: Adequate nutrition will be maintained Outcome: Progressing   Problem: Elimination: Goal: Will not experience complications related to bowel motility Outcome: Progressing Goal: Will not experience complications related to urinary retention Outcome: Progressing   Problem: Pain Managment: Goal: General experience of comfort will improve Outcome: Progressing   Problem: Safety: Goal: Ability to remain free from injury will improve Outcome: Progressing

## 2019-07-18 ENCOUNTER — Telehealth: Payer: Self-pay | Admitting: *Deleted

## 2019-07-18 NOTE — Telephone Encounter (Signed)
Recall changed to 07/2020.

## 2019-07-18 NOTE — Telephone Encounter (Signed)
-----   Message from Doran Stabler, MD sent at 07/14/2019 10:29 PM EST ----- Patient of mine had surgical resection of large rectal polyp.  Needs recall colonoscopy in 1 year

## 2019-10-03 ENCOUNTER — Encounter: Payer: Self-pay | Admitting: Family Medicine

## 2019-11-17 ENCOUNTER — Other Ambulatory Visit: Payer: Self-pay | Admitting: Family Medicine

## 2019-11-17 DIAGNOSIS — I10 Essential (primary) hypertension: Secondary | ICD-10-CM

## 2019-12-06 ENCOUNTER — Other Ambulatory Visit: Payer: Self-pay

## 2019-12-06 ENCOUNTER — Encounter: Payer: Self-pay | Admitting: Family Medicine

## 2019-12-06 MED ORDER — ROSUVASTATIN CALCIUM 20 MG PO TABS: 10.0000 mg | ORAL_TABLET | Freq: Every day | ORAL | 0 refills | Status: DC

## 2020-01-01 ENCOUNTER — Encounter: Payer: Self-pay | Admitting: Family Medicine

## 2020-01-01 ENCOUNTER — Ambulatory Visit (INDEPENDENT_AMBULATORY_CARE_PROVIDER_SITE_OTHER): Payer: Medicare Other | Admitting: Family Medicine

## 2020-01-01 ENCOUNTER — Other Ambulatory Visit: Payer: Self-pay

## 2020-01-01 VITALS — BP 140/90 | HR 64 | Temp 99.9°F | Resp 18 | Ht 65.0 in | Wt 170.2 lb

## 2020-01-01 DIAGNOSIS — E785 Hyperlipidemia, unspecified: Secondary | ICD-10-CM

## 2020-01-01 DIAGNOSIS — I10 Essential (primary) hypertension: Secondary | ICD-10-CM | POA: Diagnosis not present

## 2020-01-01 DIAGNOSIS — M79662 Pain in left lower leg: Secondary | ICD-10-CM

## 2020-01-01 MED ORDER — ROSUVASTATIN CALCIUM 20 MG PO TABS: 10.0000 mg | ORAL_TABLET | Freq: Every day | ORAL | 1 refills | Status: DC

## 2020-01-01 NOTE — Assessment & Plan Note (Signed)
Tolerating statin, encouraged heart healthy diet, avoid trans fats, minimize simple carbs and saturated fats. Increase exercise as tolerated 

## 2020-01-01 NOTE — Patient Instructions (Signed)

## 2020-01-01 NOTE — Progress Notes (Signed)
Patient ID: Tina [name], female    DOB: 23-Aug-1945  Age: 74 y.o. MRN: [id number]    Subjective:  Subjective  HPI [place] presents for f/u bp and cholesterol -- her bp runs lower at home   120/ 60-70,  Her pulse runs low but she has no symptoms from this .     She c/o L shin pain as well that comes and goes but hurts mostly with weight bearing and when she is walking for exercise   No other concerns   Review of Systems  Constitutional: Negative for appetite change, diaphoresis, fatigue and unexpected weight change.  Eyes: Negative for pain, redness and visual disturbance.  Respiratory: Negative for cough, chest tightness, shortness of breath and wheezing.   Cardiovascular: Negative for chest pain, palpitations and leg swelling.  Endocrine: Negative for cold intolerance, heat intolerance, polydipsia, polyphagia and polyuria.  Genitourinary: Negative for difficulty urinating, dysuria and frequency.  Neurological: Negative for dizziness, light-headedness, numbness and headaches.    History Past Medical History:  Diagnosis Date  . Allergy   . Cataract   . Fatty liver   . Goiter   . HTN (hypertension)   . Hyperlipidemia   . Myocardial infarct ([place]) hx of 2004  . Osteoporosis   . Sleep apnea    no cpap  . Wrist fracture     She has a past surgical history that includes Cardiac catheterization (2004); sleep study (2012); and Proctoscopy (N/A, 07/12/2019).   Her family history includes Arthritis in her brother; Cancer (age of onset: 42) in her sister; Cancer (age of onset: 60) in her father; Coronary artery disease in an other family member; Diabetes in her maternal grandmother; Heart disease in her father; Heart disease (age of onset: 98) in her mother; Hypertension in her brother, father, mother, and sister; Lung cancer in an other family member; Ovarian cancer in her mother and another family member.She reports that she quit smoking about 17 years ago. She has a 30.00 pack-year  smoking history. She quit smokeless tobacco use about 17 years ago. She reports current alcohol use. She reports that she does not use drugs.  Current Outpatient Medications on File Prior to Visit  Medication Sig Dispense Refill  . alendronate (FOSAMAX) 70 MG tablet TAKE ONE TABLET BY MOUTH ONCE WEEKLY ON EMPTY STOMACH WITH FULL GLASS OF WATER 12 tablet 1  . aspirin 81 MG tablet Take 81 mg by mouth daily.      . Calcium Carbonate-Vit D-Min (CALCIUM 1200 PO) Take 1,200 mg by mouth daily. 1 tab po qd    . Multiple Vitamin (MULTIVITAMIN) capsule Take 1 capsule by mouth daily.      . Omega-3 Fatty Acids (FISH OIL) 1000 MG CAPS Take 2,000 mg by mouth daily.    . ramipril (ALTACE) 5 MG capsule TAKE 1 CAPSULE(5 MG) BY MOUTH DAILY 90 capsule 1   No current facility-administered medications on file prior to visit.     Objective:  Objective  Physical Exam Vitals and nursing note reviewed.  Constitutional:      Appearance: She is well-developed.  HENT:     Head: Normocephalic and atraumatic.  Eyes:     Conjunctiva/sclera: Conjunctivae normal.  Neck:     Thyroid: No thyromegaly.     Vascular: No carotid bruit or JVD.  Cardiovascular:     Rate and Rhythm: Normal rate and regular rhythm.     Heart sounds: Normal heart sounds. No murmur heard.   Pulmonary:  Effort: Pulmonary effort is normal. No respiratory distress.     Breath sounds: Normal breath sounds. No wheezing or rales.  Chest:     Chest wall: No tenderness.  Musculoskeletal:        General: No swelling or tenderness.     Cervical back: Normal range of motion and neck supple.     Right lower leg: No edema.     Left lower leg: No edema.  Neurological:     Mental Status: She is alert and oriented to person, place, and time.    BP 140/90 (BP Location: Left Arm, Patient Position: Sitting, Cuff Size: Normal)   Pulse 64   Temp 99.9 F (37.7 C) (Oral)   Resp 18   Ht 5\' 5"  (1.651 m)   Wt 170 lb 3.2 oz (77.2 kg)   SpO2 97%    BMI 28.32 kg/m  Wt Readings from Last 3 Encounters:  01/01/20 170 lb 3.2 oz (77.2 kg)  07/14/19 188 lb 9.6 oz (85.5 kg)  07/10/19 186 lb 2 oz (84.4 kg)     Lab Results  Component Value Date   WBC 8.5 07/13/2019   HGB 11.4 (L) 07/13/2019   HCT 33.1 (L) 07/13/2019   PLT 200 07/13/2019   GLUCOSE 139 (H) 07/13/2019   CHOL 184 04/06/2019   TRIG 73.0 04/06/2019   HDL 80.10 04/06/2019   LDLDIRECT 123.7 05/30/2013   LDLCALC 89 04/06/2019   ALT 15 04/06/2019   AST 20 04/06/2019   NA 135 07/13/2019   K 3.6 07/13/2019   CL 100 07/13/2019   CREATININE 0.65 07/13/2019   BUN 7 (L) 07/13/2019   CO2 28 07/13/2019   TSH 1.26 04/06/2019   HGBA1C 5.4 07/10/2019    No results found.   Assessment & Plan:  Plan  I have discontinued Shyteria E. Malter's traMADol. I am also having her maintain her aspirin, Calcium Carbonate-Vit D-Min (CALCIUM 1200 PO), multivitamin, Fish Oil, alendronate, ramipril, and rosuvastatin.  Meds ordered this encounter  Medications  . rosuvastatin (CRESTOR) 20 MG tablet    Sig: Take 0.5 tablets (10 mg total) by mouth daily.    Dispense:  45 tablet    Refill:  1    NEEDS OV FOR FURTHER REFILLS    Problem List Items Addressed This Visit      Unprioritized   Essential hypertension    Well controlled, no changes to meds. Encouraged heart healthy diet such as the DASH diet and exercise as tolerated.       Relevant Medications   rosuvastatin (CRESTOR) 20 MG tablet   Other Relevant Orders   Lipid panel   Comprehensive metabolic panel   Hyperlipidemia    Tolerating statin, encouraged heart healthy diet, avoid trans fats, minimize simple carbs and saturated fats. Increase exercise as tolerated      Relevant Medications   rosuvastatin (CRESTOR) 20 MG tablet   Pain in left shin    Tylenol arthritis Ice  Rest  Ortho/ sport med prn        Other Visit Diagnoses    Dyslipidemia    -  Primary   Relevant Medications   rosuvastatin (CRESTOR) 20 MG tablet    Other Relevant Orders   Lipid panel   Comprehensive metabolic panel      Follow-up: Return in about 6 months (around 07/03/2020), or if symptoms worsen or fail to improve, for annual exam, fasting.  Ann Held, DO

## 2020-01-01 NOTE — Assessment & Plan Note (Signed)
Well controlled, no changes to meds. Encouraged heart healthy diet such as the DASH diet and exercise as tolerated.  °

## 2020-01-01 NOTE — Assessment & Plan Note (Signed)
Tylenol arthritis Ice  Rest  Ortho/ sport med prn

## 2020-01-02 LAB — COMPREHENSIVE METABOLIC PANEL
ALT: 14 U/L (ref 0–35)
AST: 20 U/L (ref 0–37)
Albumin: 4.4 g/dL (ref 3.5–5.2)
Alkaline Phosphatase: 37 U/L — ABNORMAL LOW (ref 39–117)
BUN: 16 mg/dL (ref 6–23)
CO2: 30 mEq/L (ref 19–32)
Calcium: 10 mg/dL (ref 8.4–10.5)
Chloride: 102 mEq/L (ref 96–112)
Creatinine, Ser: 0.67 mg/dL (ref 0.40–1.20)
GFR: 86.32 mL/min (ref >60.00)
Glucose, Bld: 90 mg/dL (ref 70–99)
Potassium: 3.9 mEq/L (ref 3.5–5.1)
Sodium: 138 mEq/L (ref 135–145)
Total Bilirubin: 0.5 mg/dL (ref 0.2–1.2)
Total Protein: 6.9 g/dL (ref 6.0–8.3)

## 2020-01-02 LAB — LIPID PANEL
Cholesterol: 154 mg/dL (ref 0–200)
HDL: 72.4 mg/dL (ref >39.00)
LDL Cholesterol: 67 mg/dL (ref 0–99)
NonHDL: 81.37
Total CHOL/HDL Ratio: 2
Triglycerides: 72 mg/dL (ref 0.0–149.0)
VLDL: 14.4 mg/dL (ref 0.0–40.0)

## 2020-01-29 DIAGNOSIS — H2513 Age-related nuclear cataract, bilateral: Secondary | ICD-10-CM | POA: Diagnosis not present

## 2020-01-29 DIAGNOSIS — H40023 Open angle with borderline findings, high risk, bilateral: Secondary | ICD-10-CM | POA: Diagnosis not present

## 2020-01-29 DIAGNOSIS — H02834 Dermatochalasis of left upper eyelid: Secondary | ICD-10-CM | POA: Diagnosis not present

## 2020-01-29 DIAGNOSIS — H02831 Dermatochalasis of right upper eyelid: Secondary | ICD-10-CM | POA: Diagnosis not present

## 2020-01-29 DIAGNOSIS — H43813 Vitreous degeneration, bilateral: Secondary | ICD-10-CM | POA: Diagnosis not present

## 2020-02-12 IMAGING — MG DIGITAL SCREENING BILAT W/ TOMO W/ CAD
8 series · 8 of 24 positions shown · non-contrast
Comparison: Previous exam(s).

CLINICAL DATA: Screening.

EXAM:
DIGITAL SCREENING BILATERAL MAMMOGRAM WITH TOMO AND CAD

[L CC synth-2D]
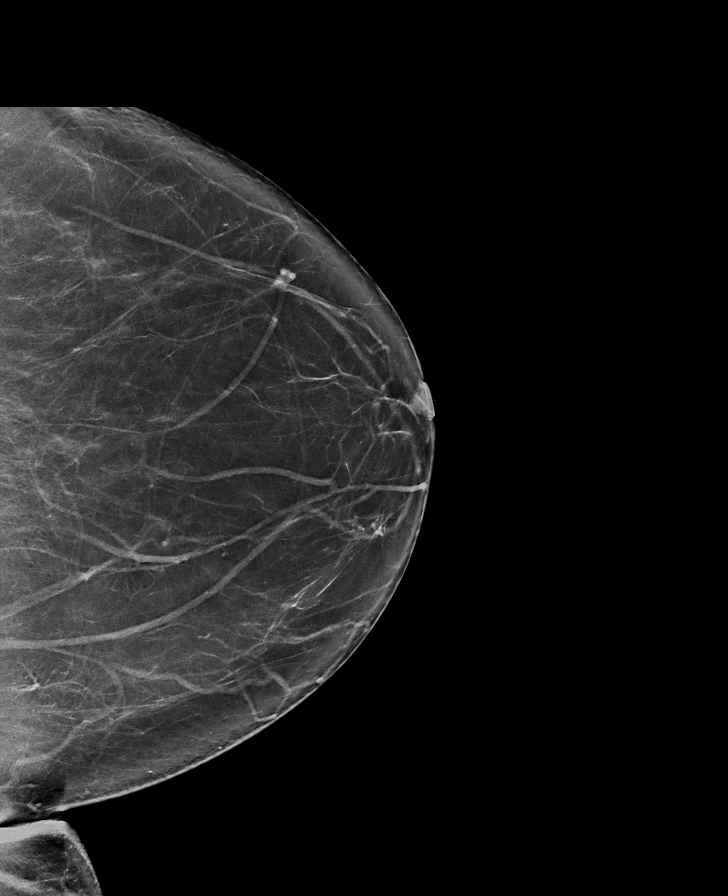

[L MLO synth-2D]
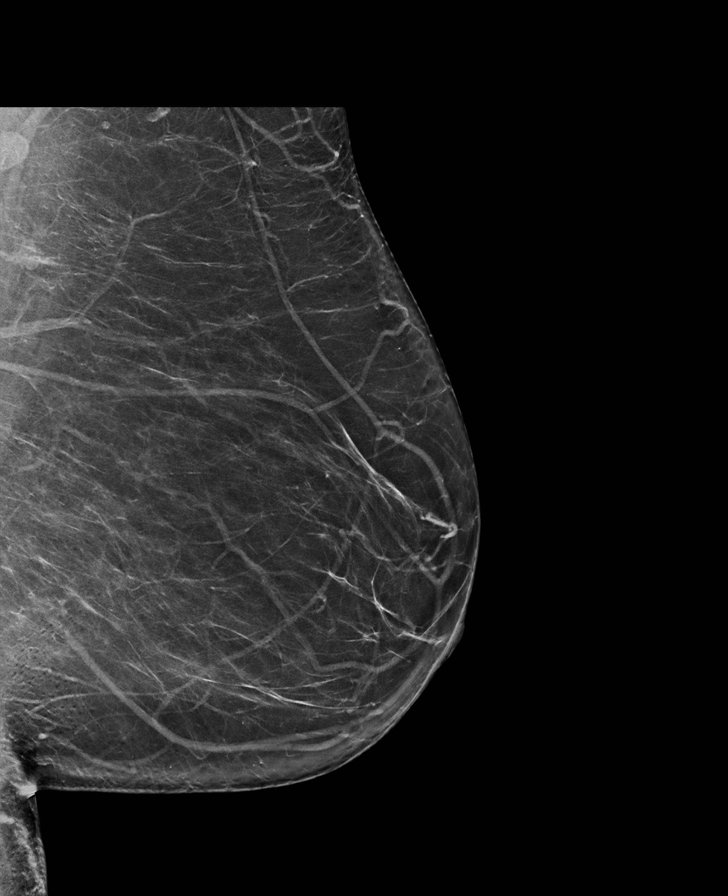

[R MLO synth-2D]
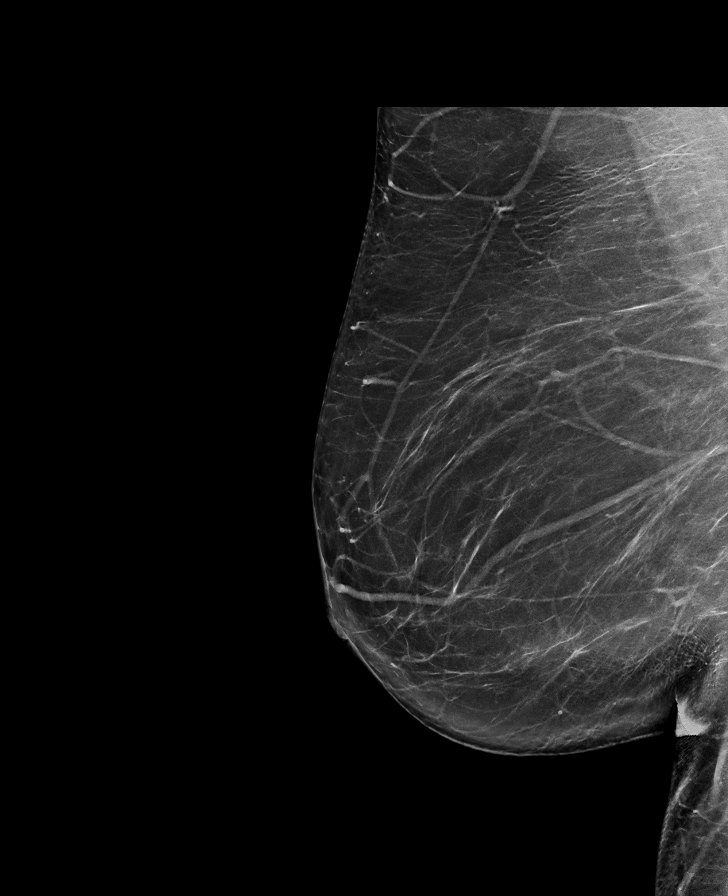

[R CC synth-2D]
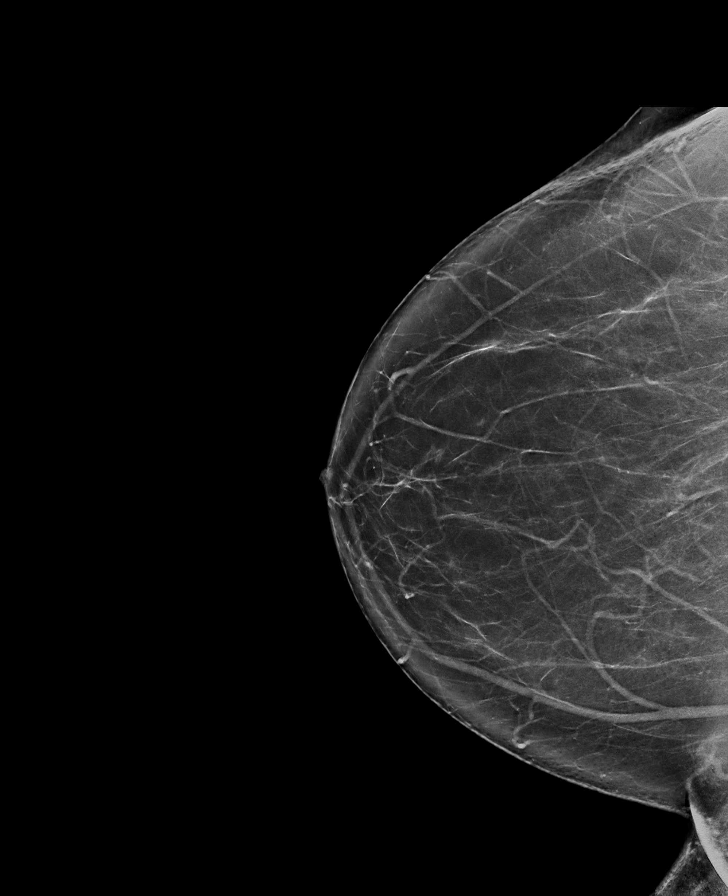

[R CC tomo · tomo slice 39/78.0]
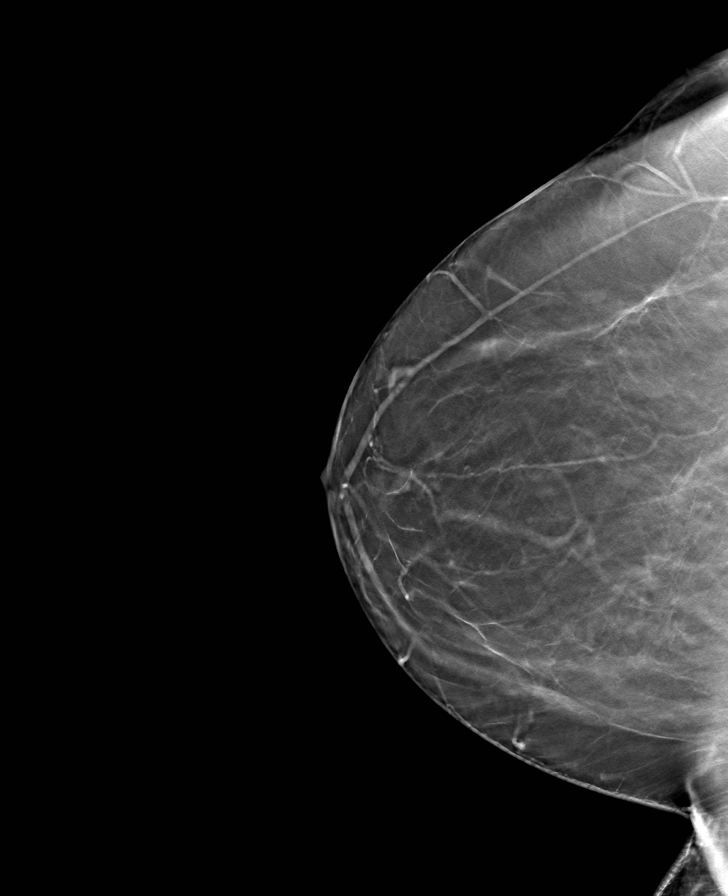

[L CC tomo · tomo slice 40/79.0]
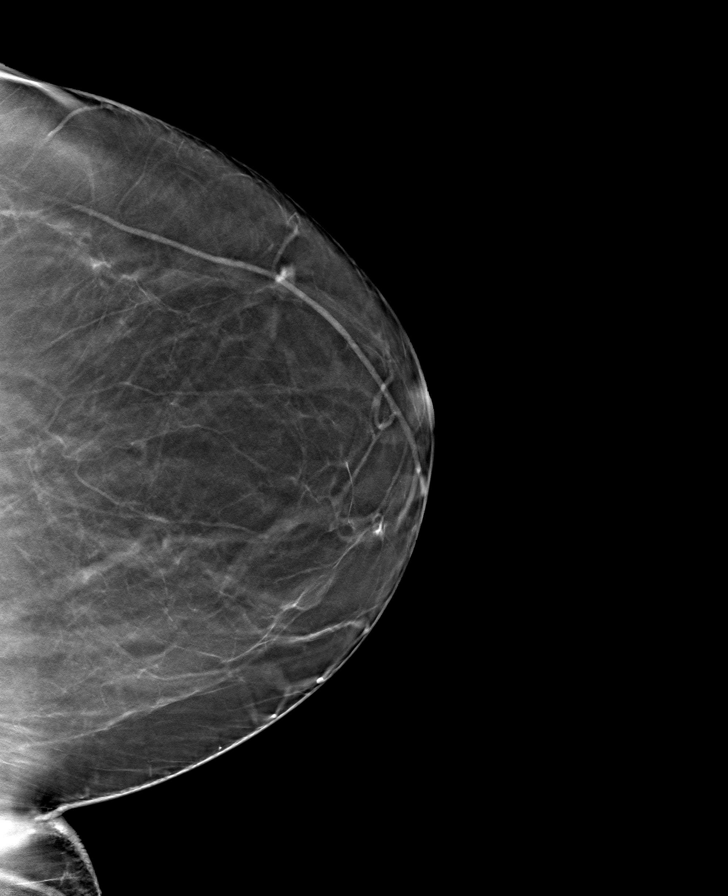

[R MLO tomo · tomo slice 42/83.0]
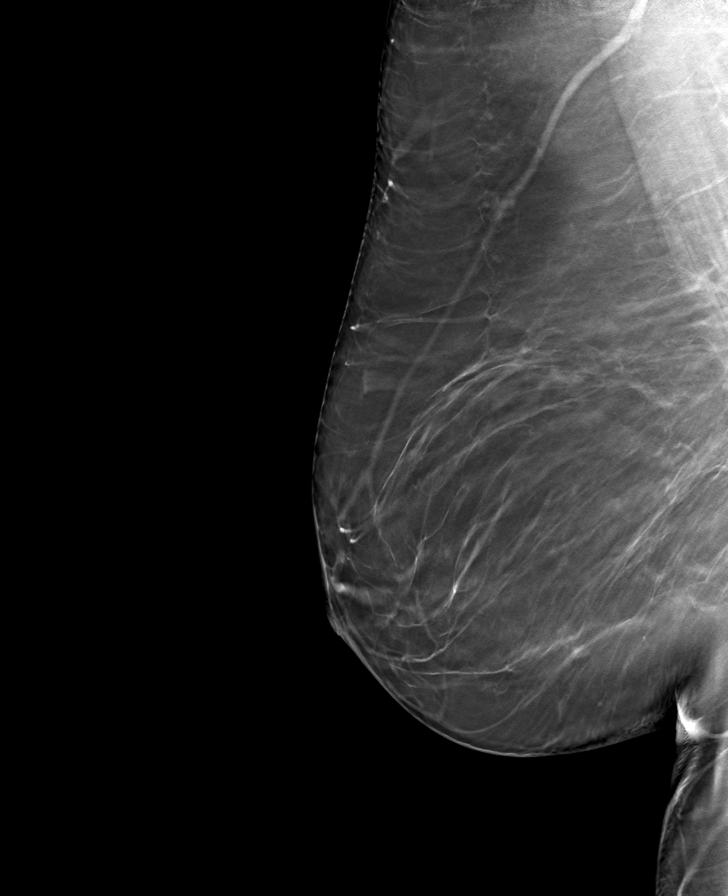

[L MLO tomo · tomo slice 40/79.0]
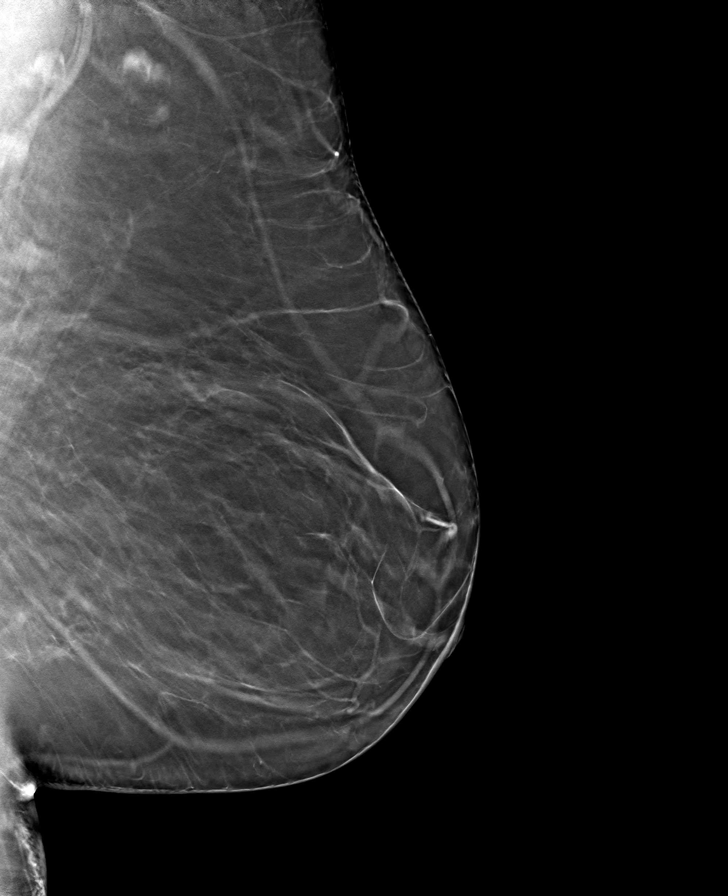

[8 of 24 positions shown; findings below may reference images not displayed]

ACR Breast Density Category b: There are scattered areas of
fibroglandular density.
FINDINGS: There are no findings suspicious for malignancy. Images were
processed with CAD.
IMPRESSION: No mammographic evidence of malignancy. A result letter of this
screening mammogram will be mailed directly to the patient.

RECOMMENDATION:
Screening mammogram in one year. (Code:[id number])

BI-RADS CATEGORY  1: Negative.

## 2020-03-15 DIAGNOSIS — H43813 Vitreous degeneration, bilateral: Secondary | ICD-10-CM | POA: Diagnosis not present

## 2020-03-15 DIAGNOSIS — H02831 Dermatochalasis of right upper eyelid: Secondary | ICD-10-CM | POA: Diagnosis not present

## 2020-03-15 DIAGNOSIS — H2513 Age-related nuclear cataract, bilateral: Secondary | ICD-10-CM | POA: Diagnosis not present

## 2020-03-15 DIAGNOSIS — H35372 Puckering of macula, left eye: Secondary | ICD-10-CM | POA: Diagnosis not present

## 2020-03-15 DIAGNOSIS — H02834 Dermatochalasis of left upper eyelid: Secondary | ICD-10-CM | POA: Diagnosis not present

## 2020-03-22 ENCOUNTER — Encounter: Payer: Self-pay | Admitting: Family Medicine

## 2020-03-22 ENCOUNTER — Other Ambulatory Visit: Payer: Self-pay | Admitting: Family Medicine

## 2020-03-22 DIAGNOSIS — Z1231 Encounter for screening mammogram for malignant neoplasm of breast: Secondary | ICD-10-CM

## 2020-03-22 DIAGNOSIS — M79662 Pain in left lower leg: Secondary | ICD-10-CM

## 2020-03-22 NOTE — Telephone Encounter (Signed)
We can refer her to sport med (I would refer to [facility], or [facility]) --- or ortho if she prefers

## 2020-03-27 ENCOUNTER — Other Ambulatory Visit: Payer: Self-pay

## 2020-03-27 ENCOUNTER — Ambulatory Visit: Payer: Self-pay

## 2020-03-27 ENCOUNTER — Ambulatory Visit: Payer: Medicare Other | Admitting: Family Medicine

## 2020-03-27 ENCOUNTER — Encounter: Payer: Self-pay | Admitting: Family Medicine

## 2020-03-27 ENCOUNTER — Ambulatory Visit (INDEPENDENT_AMBULATORY_CARE_PROVIDER_SITE_OTHER): Payer: Medicare Other

## 2020-03-27 VITALS — BP 140/86 | HR 60 | Ht 65.0 in | Wt 165.2 lb

## 2020-03-27 DIAGNOSIS — M79662 Pain in left lower leg: Secondary | ICD-10-CM

## 2020-03-27 DIAGNOSIS — M810 Age-related osteoporosis without current pathological fracture: Secondary | ICD-10-CM | POA: Diagnosis not present

## 2020-03-27 DIAGNOSIS — M47816 Spondylosis without myelopathy or radiculopathy, lumbar region: Secondary | ICD-10-CM | POA: Diagnosis not present

## 2020-03-27 NOTE — Progress Notes (Signed)
Subjective:    CC: L shin / lower leg pain  HPI: Pt is a 74 y/o female presenting w/ c/o L lower leg / shin pain.   Pain has been ongoing now for more than 6 months.  She cannot recall any injury or incident that caused the pain to start.  Pain occurs with prolonged standing and walking and improves with rest.  Pain is located primarily anterior lateral shin.  She does not have much pain posterior calf or beyond the level of the ankle.  She is not tried much treatment for it aside from a bit of rest and watchful waiting.   Pt has been diagnosed w/ osteoporosis but is not able to get a bone density test until Feb 2022.    L lower leg swelling: No Aggravating factors: walking; weight-bearing activity Treatments tried: Tylenol arthritis  Pertinent review of Systems: No fevers or chills  Relevant historical information: Osteoporosis, history CAD   Objective:    Vitals:   03/27/20 1104  BP: 140/86  Pulse: 60  SpO2: 97%   General: Well Developed, well nourished, and in no acute distress.   MSK: L-spine normal-appearing nontender normal lumbar motion.  Lower extremity strength reflexes and sensation intact throughout with exception of hip flexion which is mildly diminished 4+/5. Left knee normal-appearing nontender normal motion normal strength stable ligamentous exam. Left calf and shin normal.  Nontender. Left ankle normal-appearing normal motion normal strength. Pulses capillary fill and sensation intact into the foot.  Lab and Radiology Results  X-ray images left tib-fib and L-spine obtained today personally and independently interpreted.  L-spine: Diffuse DDD no fracture or malalignment.  Left tib-fib: Normal-appearing no abnormality occasional pain.  Await formal radiology review   Impression and Recommendations:    Assessment and Plan: 74 y.o. female with left lower leg pain worse with prolonged standing and walking.  Etiology is somewhat unclear.  Most  likely explanation is lumbar radiculopathy versus muscle fatigue.  Claudication quite unlikely given normal pulses and more milder symptoms.  Plan for x-rays as above home exercise program as taught in clinic by [facility] Voltaren gel and compressive Sleeve.  If not improving reassess.  Next step would be consider formal physical therapy versus further evaluation of lumbar radiculopathy possibly with MRI.  Tina [name] is not very bothered by her symptoms and does not want to do extensive work-up at this time.  PDMP not reviewed this encounter. Orders Placed This Encounter  Procedures  . DG Tibia/Fibula Left    Standing Status:   Future    Number of Occurrences:   1    Standing Expiration Date:   03/27/2021    Order Specific Question:   Reason for Exam (SYMPTOM  OR DIAGNOSIS REQUIRED)    Answer:   eval leg pain    Order Specific Question:   Preferred imaging location?    Answer:   [facility]  . DG Lumbar Spine 2-3 Views    Standing Status:   Future    Number of Occurrences:   1    Standing Expiration Date:   03/27/2021    Order Specific Question:   Reason for Exam (SYMPTOM  OR DIAGNOSIS REQUIRED)    Answer:   eval left leg pain    Order Specific Question:   Preferred imaging location?    Answer:   [facility]   No orders of the defined types were placed in this encounter.   Discussed warning signs or symptoms. Please  see discharge instructions. Patient expresses understanding.   The above documentation has been reviewed and is accurate and complete Lynne Leader, M.D.

## 2020-03-27 NOTE — Patient Instructions (Addendum)
Thank you for coming in today.  Please use voltaren gel up to 4x daily for pain as needed.   Do the exercises Molly reviewed with you.   Please perform the exercise program that we have prepared for you and gone over in detail on a daily basis.  In addition to the handout you were provided you can access your program through: [email or web address]   Your unique program code is: [email or web address]   Please get an Xray today before you leave  Please use voltaren gel up to 4x daily for pain as needed.   I recommend you obtained a compression sleeve to help with your joint problems. There are many options on the market however I recommend obtaining a full calf Body Helix compression sleeve.  You can find information (including how to appropriate measure yourself for sizing) can be found at www.Body http://www.lambert.com/.  Many of these products are health savings account (HSA) eligible.   You can use the compression sleeve at any time throughout the day but is most important to use while being active as well as for 2 hours post-activity.   It is appropriate to ice following activity with the compression sleeve in place.

## 2020-04-01 NOTE — Progress Notes (Signed)
X-ray left lower leg looks normal to radiology

## 2020-04-01 NOTE — Progress Notes (Signed)
X-ray lumbar spine shows arthritis.  No acute fractures.

## 2020-04-22 ENCOUNTER — Encounter (HOSPITAL_BASED_OUTPATIENT_CLINIC_OR_DEPARTMENT_OTHER): Payer: Self-pay

## 2020-04-22 ENCOUNTER — Emergency Department (HOSPITAL_BASED_OUTPATIENT_CLINIC_OR_DEPARTMENT_OTHER): Payer: Medicare Other

## 2020-04-22 ENCOUNTER — Other Ambulatory Visit: Payer: Self-pay

## 2020-04-22 ENCOUNTER — Emergency Department (HOSPITAL_BASED_OUTPATIENT_CLINIC_OR_DEPARTMENT_OTHER)
Admission: EM | Admit: 2020-04-22 | Discharge: 2020-04-22 | Disposition: A | Discharge status: Home / Self Care | Payer: Medicare Other | Attending: Emergency Medicine | Admitting: Emergency Medicine

## 2020-04-22 DIAGNOSIS — W101XXA Fall (on)(from) sidewalk curb, initial encounter: Secondary | ICD-10-CM | POA: Insufficient documentation

## 2020-04-22 DIAGNOSIS — Z87891 Personal history of nicotine dependence: Secondary | ICD-10-CM | POA: Insufficient documentation

## 2020-04-22 DIAGNOSIS — M25462 Effusion, left knee: Secondary | ICD-10-CM | POA: Diagnosis not present

## 2020-04-22 DIAGNOSIS — Z79899 Other long term (current) drug therapy: Secondary | ICD-10-CM | POA: Insufficient documentation

## 2020-04-22 DIAGNOSIS — S8391XA Sprain of unspecified site of right knee, initial encounter: Secondary | ICD-10-CM | POA: Diagnosis not present

## 2020-04-22 DIAGNOSIS — I10 Essential (primary) hypertension: Secondary | ICD-10-CM | POA: Insufficient documentation

## 2020-04-22 DIAGNOSIS — S8392XA Sprain of unspecified site of left knee, initial encounter: Secondary | ICD-10-CM | POA: Diagnosis not present

## 2020-04-22 DIAGNOSIS — X501XXA Overexertion from prolonged static or awkward postures, initial encounter: Secondary | ICD-10-CM | POA: Insufficient documentation

## 2020-04-22 DIAGNOSIS — M25562 Pain in left knee: Secondary | ICD-10-CM | POA: Diagnosis not present

## 2020-04-22 DIAGNOSIS — S8992XA Unspecified injury of left lower leg, initial encounter: Secondary | ICD-10-CM | POA: Diagnosis present

## 2020-04-22 NOTE — ED Triage Notes (Signed)
Slipped on sand and water while walking dog couple hours ago and landed on Lt knee pain and swollen

## 2020-04-22 NOTE — Discharge Instructions (Signed)
Wear knee immobilizer as applied for the next several days.  Weightbearing as tolerated.  Ice for 20 minutes every 2 hours while awake for the next 2 days.  Follow-up with orthopedics if your symptoms or not improving in the next week.  The contact information for [facility] has been provided in this discharge summary for you to call and make these arrangements.

## 2020-04-22 NOTE — ED Provider Notes (Signed)
[facility] EMERGENCY DEPARTMENT Provider Note   CSN: [id number] Arrival date & time: 04/22/20  1950     History Chief Complaint  Patient presents with   Knee Pain    Slipped on sand and water while walking dog couple hours ago and landed on Lt knee pain and swollen    Tina [name] is a 74 y.o. female.  Patient is a 74 year old female with history of hypertension, hyperlipidemia.  She presents today for evaluation of a left knee injury.  Patient was walking on a sandy sidewalk when she slipped and fell and twisted her knee.  She has had swelling and pain since.  Pain is worse with ambulation and range of motion.  Pain is relieved with rest.  The history is provided by the patient.  Knee Pain Location:  Knee Time since incident:  3 hours Injury: yes   Knee location:  L knee Pain details:    Quality:  Aching   Radiates to:  Does not radiate   Severity:  Moderate   Onset quality:  Sudden   Timing:  Constant   Progression:  Worsening Chronicity:  New      Past Medical History:  Diagnosis Date   Allergy    Cataract    Fatty liver    Goiter    HTN (hypertension)    Hyperlipidemia    Myocardial infarct ([place]) hx of 2004   Osteoporosis    Sleep apnea    no cpap   Wrist fracture     Patient Active Problem List   Diagnosis Date Noted   Pain in left shin 01/01/2020   Adenomatous polyp s/p LAR rectosigmoid resection 07/12/2019 05/15/2019   Age-related osteoporosis without current pathological fracture 04/06/2019   Sinus bradycardia 02/24/2017   Post-menopausal bleeding 10/28/2016   Left shoulder pain 01/22/2016   Right wrist injury 03/18/2015   Obesity (BMI 30-39.9) 05/29/2013   Otitis externa 09/30/2010   CONJUNCTIVITIS, BACTERIAL 12/16/2009   SINUSITIS- ACUTE-NOS 08/01/2009   TOBACCO ABUSE, HX OF 07/22/2009   OTITIS EXTERNA, ACUTE, RIGHT 02/08/2009   Goiter 02/10/2007   Hyperlipidemia 02/10/2007   Essential hypertension  02/10/2007   MYOCARDIAL INFARCTION, HX OF 02/10/2007   DIZZINESS 02/10/2007   EDEMA LEG 02/10/2007   FATTY LIVER DISEASE, HX OF 02/10/2007    Past Surgical History:  Procedure Laterality Date   CARDIAC CATHETERIZATION  2004   stent   PROCTOSCOPY N/A 07/12/2019   Procedure: RIGID PROCTOSCOPY;  Surgeon: Michael Boston, MD;  Location: [facility];  Service: General;  Laterality: N/A;   sleep study  2012     OB History    Gravida  2   Para  2   Term  2   Preterm      AB      Living  2     SAB      TAB      Ectopic      Multiple      Live Births              Family History  Problem Relation Age of Onset   Hypertension Mother    Heart disease Mother 32       MI--- died from #3 at 60   Ovarian cancer Mother    Hypertension Father    Cancer Father 74       ovarian   Heart disease Father    Hypertension Sister    Cancer Sister 7  brain tumor   Coronary artery disease Other    Lung cancer Other    Ovarian cancer Other    Diabetes Maternal Grandmother    Hypertension Brother    Arthritis Brother    Colon cancer Neg Hx    Colon polyps Neg Hx    Esophageal cancer Neg Hx    Stomach cancer Neg Hx    Rectal cancer Neg Hx     Social History   Tobacco Use   Smoking status: Former Smoker    Packs/day: 1.00    Years: 30.00    Pack years: 30.00    Quit date: 12/13/2002    Years since quitting: 17.3   Smokeless tobacco: Former Systems developer    Quit date: 06/01/2002  Vaping Use   Vaping Use: Never used  Substance Use Topics   Alcohol use: Yes    Comment: 1-2 glasses of wine with dinner   Drug use: No    Home Medications Prior to Admission medications   Medication Sig Start Date End Date Taking? Authorizing Provider  alendronate (FOSAMAX) 70 MG tablet TAKE ONE TABLET BY MOUTH ONCE WEEKLY ON EMPTY STOMACH WITH FULL GLASS OF WATER 06/29/19   Carollee Herter, Alferd Apa, DO  aspirin 81 MG tablet Take 81 mg by mouth daily.      [provider]  Calcium Carbonate-Vit D-Min (CALCIUM 1200 PO) Take 1,200 mg by mouth daily. 1 tab po qd    [provider]  Multiple Vitamin (MULTIVITAMIN) capsule Take 1 capsule by mouth daily.      [provider]  Omega-3 Fatty Acids (FISH OIL) 1000 MG CAPS Take 2,000 mg by mouth daily.    [provider]  ramipril (ALTACE) 5 MG capsule TAKE 1 CAPSULE(5 MG) BY MOUTH DAILY 11/21/19   Carollee Herter, Alferd Apa, DO  rosuvastatin (CRESTOR) 20 MG tablet Take 0.5 tablets (10 mg total) by mouth daily. 01/01/20   Ann Held, DO    Allergies    Patient has no known allergies.  Review of Systems   Review of Systems  All other systems reviewed and are negative.   Physical Exam Updated Vital Signs BP (!) 167/78 (BP Location: Right Arm)    Pulse 92    Temp 98.2 F (36.8 C) (Oral)    Resp 18    Ht 5\' 5"  (1.651 m)    Wt 73.5 kg    SpO2 99%    BMI 26.96 kg/m   Physical Exam Vitals and nursing note reviewed.  Constitutional:      General: She is not in acute distress.    Appearance: Normal appearance. She is not ill-appearing.  HENT:     Head: Normocephalic and atraumatic.  Pulmonary:     Effort: Pulmonary effort is normal.  Musculoskeletal:     Comments: The left knee is noted to have a moderate sized effusion.  She has pain with range of motion which limits exam.  Anterior and posterior drawer test are negative and there is no significant laxity with varus or valgus stress.  Skin:    General: Skin is warm and dry.  Neurological:     Mental Status: She is alert and oriented to person, place, and time.     ED Results / Procedures / Treatments   Labs (all labs ordered are listed, but only abnormal results are displayed) Labs Reviewed - No data to display  EKG None  Radiology No results found.  Procedures Procedures (including critical care  time)  Medications Ordered in ED Medications - No data to display  ED Course  I have reviewed the triage  vital signs and the nursing notes.  Pertinent labs & imaging results that were available during my care of the patient were reviewed by me and considered in my medical decision making (see chart for details).    MDM Rules/Calculators/A&P  Patient with a sprain of the left knee with effusion.  The knee otherwise appears stable and no evidence for internal derangement on physical exam.  At this point, patient will be immobilized and given crutches.  She will be advised to nonweight-bear, keep her leg elevated, and follow-up with orthopedics in the next week if she is not improving.  Final Clinical Impression(s) / ED Diagnoses Final diagnoses:  None    Rx / DC Orders ED Discharge Orders    None       Veryl Speak, MD 04/22/20 2036

## 2020-04-30 ENCOUNTER — Ambulatory Visit (INDEPENDENT_AMBULATORY_CARE_PROVIDER_SITE_OTHER): Payer: Medicare Other | Admitting: Internal Medicine

## 2020-04-30 ENCOUNTER — Other Ambulatory Visit: Payer: Self-pay

## 2020-04-30 VITALS — BP 137/84 | HR 64 | Temp 97.7°F | Ht 65.0 in | Wt 166.0 lb

## 2020-04-30 DIAGNOSIS — S8002XD Contusion of left knee, subsequent encounter: Secondary | ICD-10-CM

## 2020-04-30 NOTE — Progress Notes (Signed)
Subjective:    Patient ID: Tina [name], female    DOB: [birth year], 74 y.o.   MRN: [id number]  DOS:  04/30/2020 Type of visit - description: ER follow-up  Patient went to the ER 04/22/2020 after mechanical fall and injured  her left knee. At the ER, the x-ray was normal, she was recommended to use crutches and knee immobilizer. Few days after that, she felt better and is not using any assistance. Pain is not a issue, the knee is somewhat stiff still. Denies any calf pain or calf swelling.   Review of Systems See above   Past Medical History:  Diagnosis Date  . Allergy   . Cataract   . Fatty liver   . Goiter   . HTN (hypertension)   . Hyperlipidemia   . Myocardial infarct ([place]) hx of 2004  . Osteoporosis   . Sleep apnea    no cpap  . Wrist fracture     Past Surgical History:  Procedure Laterality Date  . CARDIAC CATHETERIZATION  2004   stent  . PROCTOSCOPY N/A 07/12/2019   Procedure: RIGID PROCTOSCOPY;  Surgeon: Michael Boston, MD;  Location: [facility];  Service: General;  Laterality: N/A;  . sleep study  2012    Allergies as of 04/30/2020   No Known Allergies     Medication List       Accurate as of April 30, 2020 11:59 PM. If you have any questions, ask your nurse or doctor.        alendronate 70 MG tablet Commonly known as: FOSAMAX TAKE ONE TABLET BY MOUTH ONCE WEEKLY ON EMPTY STOMACH WITH FULL GLASS OF WATER   aspirin 81 MG tablet Take 81 mg by mouth daily.   CALCIUM 1200 PO Take 1,200 mg by mouth daily. 1 tab po qd   Fish Oil 1000 MG Caps Take 2,000 mg by mouth daily.   multivitamin capsule Take 1 capsule by mouth daily.   ramipril 5 MG capsule Commonly known as: ALTACE TAKE 1 CAPSULE(5 MG) BY MOUTH DAILY   rosuvastatin 20 MG tablet Commonly known as: CRESTOR Take 0.5 tablets (10 mg total) by mouth daily.          Objective:   Physical Exam BP 137/84 (BP Location: Right Arm, Patient Position: Sitting, Cuff Size: Large)   Pulse 64    Temp 97.7 F (36.5 C) (Oral)   Ht 5\' 5"  (1.651 m)   Wt 166 lb (75.3 kg)   SpO2 99%   BMI 27.62 kg/m  General:   Well developed, NAD, BMI noted. HEENT:  Normocephalic . Face symmetric, atraumatic MSK: Right knee: Changes consistent with DJD Left knee: Small joint effusion noted, not warm or red. Range of motion is normal, joint seem stable. Calves symmetric and nontender.  Skin: Not pale. Not jaundice Neurologic:  alert & oriented X3.  Speech normal, gait unassisted, mild limping noted Psych--  Cognition and judgment appear intact.  Cooperative with normal attention span and concentration.  Behavior appropriate. No anxious or depressed appearing.      Assessment      74 year old female, PMH includes HTN, CAD, osteoporosis, presents for a ER follow-up  Left knee contusion: As described above, x-ray negative, swelling and pain are much reduced. She is not taking or needing any pain medication, is not using crutches or immobilizer. Exam is consistent with a small knee effusion. Plan: Use a cane to regain some stability and prevent another fall and also to relieve the pressure  from the left knee. Encouraged to continue range of motion exercises. If not better let me know. See AVS.  This visit occurred during the SARS-CoV-2 public health emergency.  Safety protocols were in place, including screening questions prior to the visit, additional usage of staff PPE, and extensive cleaning of exam room while observing appropriate contact time as indicated for disinfecting solutions.

## 2020-04-30 NOTE — Patient Instructions (Signed)
Recommend to use a cane temporarily to relieve some of the pressure of your knee.  At the same time, I recommend you to stay active exercise the range of motion of your knee daily.  If you are not gradually better let us know  If the stiffness gets worse also let us know.

## 2020-05-01 ENCOUNTER — Other Ambulatory Visit: Payer: Self-pay | Admitting: Family Medicine

## 2020-05-01 DIAGNOSIS — Q782 Osteopetrosis: Secondary | ICD-10-CM

## 2020-05-01 DIAGNOSIS — H2513 Age-related nuclear cataract, bilateral: Secondary | ICD-10-CM | POA: Diagnosis not present

## 2020-05-01 DIAGNOSIS — H2511 Age-related nuclear cataract, right eye: Secondary | ICD-10-CM | POA: Diagnosis not present

## 2020-05-01 HISTORY — PX: CATARACT EXTRACTION, BILATERAL: SHX1313

## 2020-05-14 DIAGNOSIS — H2511 Age-related nuclear cataract, right eye: Secondary | ICD-10-CM | POA: Diagnosis not present

## 2020-05-16 ENCOUNTER — Ambulatory Visit: Payer: Medicare Other

## 2020-05-18 ENCOUNTER — Other Ambulatory Visit: Payer: Self-pay | Admitting: Family Medicine

## 2020-05-18 DIAGNOSIS — I10 Essential (primary) hypertension: Secondary | ICD-10-CM

## 2020-05-20 DIAGNOSIS — H2512 Age-related nuclear cataract, left eye: Secondary | ICD-10-CM | POA: Diagnosis not present

## 2020-05-28 DIAGNOSIS — H2512 Age-related nuclear cataract, left eye: Secondary | ICD-10-CM | POA: Diagnosis not present

## 2020-06-17 ENCOUNTER — Ambulatory Visit: Payer: Medicare Other | Admitting: Internal Medicine

## 2020-07-05 ENCOUNTER — Other Ambulatory Visit: Payer: Self-pay | Admitting: Family Medicine

## 2020-07-05 DIAGNOSIS — E785 Hyperlipidemia, unspecified: Secondary | ICD-10-CM

## 2020-07-09 ENCOUNTER — Encounter: Payer: Self-pay | Admitting: Internal Medicine

## 2020-07-09 ENCOUNTER — Other Ambulatory Visit: Payer: Self-pay

## 2020-07-09 ENCOUNTER — Ambulatory Visit: Payer: Medicare Other | Admitting: Internal Medicine

## 2020-07-09 VITALS — BP 130/84 | HR 55 | Ht 65.0 in | Wt 167.4 lb

## 2020-07-09 DIAGNOSIS — E782 Mixed hyperlipidemia: Secondary | ICD-10-CM

## 2020-07-09 DIAGNOSIS — I10 Essential (primary) hypertension: Secondary | ICD-10-CM | POA: Diagnosis not present

## 2020-07-09 NOTE — Patient Instructions (Signed)
Medication Instructions:  No changes *If you need a refill on your cardiac medications before your next appointment, please call your pharmacy*   Lab Work: Today: cbc, cmet If you have labs (blood work) drawn today and your tests are completely normal, you will receive your results only by: Marland Kitchen MyChart Message (if you have MyChart) OR . A paper copy in the mail If you have any lab test that is abnormal or we need to change your treatment, we will call you to review the results.   Testing/Procedures: none  Follow-Up: At [facility], you and your health needs are our priority.  As part of our continuing mission to provide you with exceptional heart care, we have created designated Provider Care Teams.  These Care Teams include your primary Cardiologist (physician) and Advanced Practice Providers (APPs -  Physician Assistants and Nurse Practitioners) who all work together to provide you with the care you need, when you need it.   Your next appointment:   12 month(s)  The format for your next appointment:   In Person  Provider:   You may see Dorris Carnes, MD  or one of the following Advanced Practice Providers on your designated Care Team:    Richardson Dopp, PA-C  Robbie Lis, [place]    Other Instructions

## 2020-07-09 NOTE — Progress Notes (Signed)
Cardiology Office Note   Date:  07/09/2020   ID:  Tina [name], DOB 12-19-1945, MRN [id number]  PCP:  Carollee Herter, Tina Apa, DO  Cardiologist:   Dorris Carnes, MD   Pt presents for preop cardiac risk stratification.   History of Present Illness: Tina [name] is a 75 y.o. female with a history of hypertension, hyperlipidemia, goiter and CAD (s/p NSTEMI in 2004 with PTCA/stent to LAD).   Since seen the pt says her breathghin is OK   She denies CP    SHe fell at [place].  Sprained knee  Activity has been down since.       Current Meds  Medication Sig  . alendronate (FOSAMAX) 70 MG tablet TAKE ONE TABLET BY MOUTH ONCE WEEKLY ON EMPTY STOMACH WITH FULL GLASS OF WATER  . aspirin 81 MG tablet Take 81 mg by mouth daily.  . Calcium Carbonate-Vit D-Min (CALCIUM 1200 PO) Take 1,200 mg by mouth daily. 1 tab po qd  . Multiple Vitamin (MULTIVITAMIN) capsule Take 1 capsule by mouth daily.  . Omega-3 Fatty Acids (FISH OIL) 1000 MG CAPS Take 2,000 mg by mouth daily.  . ramipril (ALTACE) 5 MG capsule TAKE 1 CAPSULE(5 MG) BY MOUTH DAILY  . rosuvastatin (CRESTOR) 20 MG tablet TAKE 1/2 TABLET(10 MG) BY MOUTH DAILY     Allergies:   Patient has no known allergies.   Past Medical History:  Diagnosis Date  . Allergy   . Cataract   . Fatty liver   . Goiter   . HTN (hypertension)   . Hyperlipidemia   . Myocardial infarct ([place]) hx of 2004  . Osteoporosis   . Sleep apnea    no cpap  . Wrist fracture     Past Surgical History:  Procedure Laterality Date  . CARDIAC CATHETERIZATION  2004   stent  . PROCTOSCOPY N/A 07/12/2019   Procedure: RIGID PROCTOSCOPY;  Surgeon: Michael Boston, MD;  Location: [facility];  Service: General;  Laterality: N/A;  . sleep study  2012     Social History:  The patient  reports that she quit smoking about 17 years ago. She has a 30.00 pack-year smoking history. She quit smokeless tobacco use about 18 years ago. She reports current alcohol use. She reports that she  does not use drugs.   Family History:  The patient's family history includes Arthritis in her brother; Cancer (age of onset: 21) in her sister; Cancer (age of onset: 34) in her father; Coronary artery disease in an other family member; Diabetes in her maternal grandmother; Heart disease in her father; Heart disease (age of onset: 72) in her mother; Hypertension in her brother, father, mother, and sister; Lung cancer in an other family member; Ovarian cancer in her mother and another family member.    ROS:  Please see the history of present illness. All other systems are reviewed and  Negative to the above problem except as noted.    PHYSICAL EXAM: VS:  BP 130/84   Pulse (!) 55   Ht 5\' 5"  (1.651 m)   Wt 167 lb 6.4 oz (75.9 kg)   BMI 27.86 kg/m   GEN: Obese 75 year old, in no acute distress  HEENT: normal  Neck: no JVD, carotid bruits Cardiac: RRR; no murmurs,  No LE edema  Respiratory:  clear to auscultation bilaterally, normal work of breathing GI: soft, nontender, nondistended, + BS  No hepatomegaly  MS: no deformity Moving all extremities   Skin: warm and dry, no  rash Neuro:  Strength and sensation are intact Psych: euthymic mood, full affect   EKG:  EKG is ordered today.  Sinus bradycardia 55 bpm.    Lipid Panel    Component Value Date/Time   CHOL 154 01/01/2020 1612   TRIG 72.0 01/01/2020 1612   HDL 72.40 01/01/2020 1612   CHOLHDL 2 01/01/2020 1612   VLDL 14.4 01/01/2020 1612   LDLCALC 67 01/01/2020 1612   LDLDIRECT 123.7 05/30/2013 0811      Wt Readings from Last 3 Encounters:  07/09/20 167 lb 6.4 oz (75.9 kg)  04/30/20 166 lb (75.3 kg)  04/22/20 162 lb (73.5 kg)      ASSESSMENT AND PLAN:  1.  CAD Pt is without angina   WIll continue to follow   2  HL   Lipids in Aug very good   LDL 67  HDL 72   Continue meds    3  HTN  BP is OK   Continue meds   Will check CBC and CMET     Plan for f/u in 1 year   Sooner for problems    Current medicines are  reviewed at length with the patient today.  The patient does not have concerns regarding medicines.  Signed, Dorris Carnes, MD  07/09/2020 4:21 PM    [facility] [place], [place], [place]  [address] Phone: [phone]; Fax: [phone]

## 2020-07-10 LAB — COMPREHENSIVE METABOLIC PANEL
ALT: 14 IU/L (ref 0–32)
AST: 21 IU/L (ref 0–40)
Albumin/Globulin Ratio: 2.8 — ABNORMAL HIGH (ref 1.2–2.2)
Albumin: 4.7 g/dL (ref 3.7–4.7)
Alkaline Phosphatase: 45 IU/L (ref 44–121)
BUN/Creatinine Ratio: 29 — ABNORMAL HIGH (ref 12–28)
BUN: 18 mg/dL (ref 8–27)
Bilirubin Total: 0.4 mg/dL (ref 0.0–1.2)
CO2: 26 mmol/L (ref 20–29)
Calcium: 9.9 mg/dL (ref 8.7–10.3)
Chloride: 99 mmol/L (ref 96–106)
Creatinine, Ser: 0.62 mg/dL (ref 0.57–1.00)
GFR calc Af Amer: 103 mL/min/{1.73_m2} (ref >59)
GFR calc non Af Amer: 90 mL/min/{1.73_m2} (ref >59)
Globulin, Total: 1.7 g/dL (ref 1.5–4.5)
Glucose: 88 mg/dL (ref 65–99)
Potassium: 4.5 mmol/L (ref 3.5–5.2)
Sodium: 139 mmol/L (ref 134–144)
Total Protein: 6.4 g/dL (ref 6.0–8.5)

## 2020-07-10 LAB — CBC
Hematocrit: 40.8 % (ref 34.0–46.6)
Hemoglobin: 13.5 g/dL (ref 11.1–15.9)
MCH: 31 pg (ref 26.6–33.0)
MCHC: 33.1 g/dL (ref 31.5–35.7)
MCV: 94 fL (ref 79–97)
Platelets: 239 10*3/uL (ref 150–450)
RBC: 4.35 x10E6/uL (ref 3.77–5.28)
RDW: 12.4 % (ref 11.7–15.4)
WBC: 7.2 10*3/uL (ref 3.4–10.8)

## 2020-08-01 ENCOUNTER — Other Ambulatory Visit: Payer: Self-pay | Admitting: Family Medicine

## 2020-08-01 DIAGNOSIS — M810 Age-related osteoporosis without current pathological fracture: Secondary | ICD-10-CM

## 2020-08-06 ENCOUNTER — Other Ambulatory Visit: Payer: Medicare Other

## 2020-08-06 ENCOUNTER — Ambulatory Visit: Payer: Medicare Other

## 2020-08-15 ENCOUNTER — Inpatient Hospital Stay: Admission: RE | Admit: 2020-08-15 | Payer: Medicare Other | Source: Ambulatory Visit

## 2020-10-07 ENCOUNTER — Ambulatory Visit: Payer: Medicare Other

## 2020-10-25 ENCOUNTER — Other Ambulatory Visit: Payer: Self-pay

## 2020-10-25 ENCOUNTER — Encounter: Payer: Self-pay | Admitting: Family Medicine

## 2020-10-25 MED ORDER — ALENDRONATE SODIUM 70 MG PO TABS: ORAL_TABLET | ORAL | 1 refills | Status: DC

## 2020-10-30 ENCOUNTER — Other Ambulatory Visit: Payer: Self-pay

## 2020-10-30 ENCOUNTER — Ambulatory Visit
Admission: RE | Admit: 2020-10-30 | Discharge: 2020-10-30 | Disposition: A | Discharge status: Home / Self Care | Payer: Medicare Other | Source: Ambulatory Visit | Attending: Family Medicine | Admitting: Family Medicine

## 2020-10-30 DIAGNOSIS — M810 Age-related osteoporosis without current pathological fracture: Secondary | ICD-10-CM | POA: Diagnosis not present

## 2020-10-30 DIAGNOSIS — Z780 Asymptomatic menopausal state: Secondary | ICD-10-CM | POA: Diagnosis not present

## 2020-10-30 DIAGNOSIS — M8588 Other specified disorders of bone density and structure, other site: Secondary | ICD-10-CM | POA: Diagnosis not present

## 2020-11-04 ENCOUNTER — Ambulatory Visit (HOSPITAL_BASED_OUTPATIENT_CLINIC_OR_DEPARTMENT_OTHER)
Admission: RE | Admit: 2020-11-04 | Discharge: 2020-11-04 | Disposition: A | Discharge status: Home / Self Care | Payer: Medicare Other | Source: Ambulatory Visit | Attending: Family Medicine | Admitting: Family Medicine

## 2020-11-04 ENCOUNTER — Ambulatory Visit (INDEPENDENT_AMBULATORY_CARE_PROVIDER_SITE_OTHER): Payer: Medicare Other | Admitting: Family Medicine

## 2020-11-04 ENCOUNTER — Encounter: Payer: Self-pay | Admitting: Family Medicine

## 2020-11-04 ENCOUNTER — Other Ambulatory Visit: Payer: Self-pay

## 2020-11-04 VITALS — BP 126/68 | HR 56 | Temp 98.6°F | Resp 18 | Ht 65.0 in | Wt 166.4 lb

## 2020-11-04 DIAGNOSIS — M810 Age-related osteoporosis without current pathological fracture: Secondary | ICD-10-CM

## 2020-11-04 DIAGNOSIS — M25561 Pain in right knee: Secondary | ICD-10-CM | POA: Diagnosis not present

## 2020-11-04 DIAGNOSIS — I10 Essential (primary) hypertension: Secondary | ICD-10-CM

## 2020-11-04 DIAGNOSIS — E785 Hyperlipidemia, unspecified: Secondary | ICD-10-CM

## 2020-11-04 NOTE — Assessment & Plan Note (Signed)
Encouraged heart healthy diet, increase exercise, avoid trans fats, consider a krill oil cap daily 

## 2020-11-04 NOTE — Assessment & Plan Note (Signed)
Well controlled, no changes to meds. Encouraged heart healthy diet such as the DASH diet and exercise as tolerated.  °

## 2020-11-04 NOTE — Progress Notes (Signed)
Patient ID: Tina [name], female    DOB: [birth year]  Age: 75 y.o. MRN: [id number]    Subjective:  Subjective  HPI Tina [name] presents for an office visit today. She complains of osteoporosis. She endorses taking 70 mg of Fosamax PO Daily for her dx of osteoporosis, however it didn't help improve her symptoms. She reports receiving cataract surgery in December, 2021, however she notes that objects aren't well defined.  She also complains of right knee pain. She states that she fell in November, 2021 on her left knee, and May, 2022 on the side of her right knee. She notes that it worsen when she walks up and down stairs. She reports that she has decrease ROM in the L knee and sometimes, her L leg gives out. She denies any chest pain, SOB, fever, abdominal pain, cough, chills, sore throat, dysuria, urinary incontinence, back pain, HA, or N/V/D at this time. Pt is requesting for shingles and TDAP vaccination. She states that she is receiving a mammogram in July, 2022.   Review of Systems  Constitutional: Negative for chills, fatigue and fever.       (+)osteoporosis  HENT: Negative for ear pain, rhinorrhea, sinus pressure, sinus pain, sore throat and tinnitus.   Eyes: Negative for pain.  Respiratory: Negative for cough, shortness of breath and wheezing.   Cardiovascular: Negative for chest pain.  Gastrointestinal: Negative for abdominal pain, anal bleeding, constipation, diarrhea, nausea and vomiting.  Genitourinary: Negative for flank pain.  Musculoskeletal: Negative for back pain and neck pain.       (+) right knee pain    Skin: Negative for rash.  Neurological: Negative for seizures, weakness, light-headedness, numbness and headaches.    History Past Medical History:  Diagnosis Date  . Allergy   . Cataract   . Fatty liver   . Goiter   . HTN (hypertension)   . Hyperlipidemia   . Myocardial infarct ([place]) hx of 2004  . Osteoporosis   . Sleep apnea    no cpap  . Wrist fracture      She has a past surgical history that includes Cardiac catheterization (2004); sleep study (2012); Proctoscopy (N/A, 07/12/2019); Eye surgery; and Cataract extraction, bilateral (05/2020).   Her family history includes Arthritis in her brother; Cancer (age of onset: 51) in her sister; Cancer (age of onset: 36) in her father; Coronary artery disease in an other family member; Diabetes in her maternal grandmother; Heart disease in her father; Heart disease (age of onset: 1) in her mother; Hypertension in her brother, father, mother, and sister; Lung cancer in an other family member; Ovarian cancer in her mother and another family member.She reports that she quit smoking about 17 years ago. She has a 30.00 pack-year smoking history. She quit smokeless tobacco use about 18 years ago. She reports current alcohol use. She reports that she does not use drugs.  Current Outpatient Medications on File Prior to Visit  Medication Sig Dispense Refill  . alendronate (FOSAMAX) 70 MG tablet TAKE ONE TABLET BY MOUTH ONCE WEEKLY ON EMPTY STOMACH WITH FULL GLASS OF WATER 12 tablet 1  . aspirin 81 MG tablet Take 81 mg by mouth daily.    . Calcium Carbonate-Vit D-Min (CALCIUM 1200 PO) Take 1,200 mg by mouth daily. 1 tab po qd    . Multiple Vitamin (MULTIVITAMIN) capsule Take 1 capsule by mouth daily.    . Omega-3 Fatty Acids (FISH OIL) 1000 MG CAPS Take 2,000 mg by mouth daily.    Marland Kitchen  ramipril (ALTACE) 5 MG capsule TAKE 1 CAPSULE(5 MG) BY MOUTH DAILY 90 capsule 1  . rosuvastatin (CRESTOR) 20 MG tablet TAKE 1/2 TABLET(10 MG) BY MOUTH DAILY 45 tablet 1   No current facility-administered medications on file prior to visit.     Objective:  Objective  Physical Exam Vitals and nursing note reviewed.  Constitutional:      General: She is not in acute distress.    Appearance: Normal appearance. She is well-developed. She is not ill-appearing.  HENT:     Head: Normocephalic and atraumatic.     Right Ear: External ear  normal.     Left Ear: External ear normal.     Nose: Nose normal.  Eyes:     General:        Right eye: No discharge.        Left eye: No discharge.     Extraocular Movements: Extraocular movements intact.     Pupils: Pupils are equal, round, and reactive to light.  Cardiovascular:     Rate and Rhythm: Normal rate and regular rhythm.     Pulses: Normal pulses.     Heart sounds: Normal heart sounds. No murmur heard. No friction rub. No gallop.   Pulmonary:     Effort: Pulmonary effort is normal. No respiratory distress.     Breath sounds: Normal breath sounds. No stridor. No wheezing, rhonchi or rales.  Chest:     Chest wall: No tenderness.  Abdominal:     General: Bowel sounds are normal. There is no distension.     Palpations: Abdomen is soft. There is no mass.     Tenderness: There is no abdominal tenderness. There is no guarding or rebound.     Hernia: No hernia is present.  Musculoskeletal:        General: No swelling, tenderness or deformity.     Cervical back: Normal range of motion and neck supple.     Right lower leg: No edema.     Left lower leg: No edema.  Skin:    General: Skin is warm and dry.  Neurological:     Mental Status: She is alert and oriented to person, place, and time.  Psychiatric:        Behavior: Behavior normal.        Thought Content: Thought content normal.    BP 126/68 (BP Location: Right Arm, Patient Position: Sitting, Cuff Size: Normal)   Pulse (!) 56   Temp 98.6 F (37 C) (Oral)   Resp 18   Ht 5\' 5"  (1.651 m)   Wt 166 lb 6.4 oz (75.5 kg)   SpO2 98%   BMI 27.69 kg/m  Wt Readings from Last 3 Encounters:  11/04/20 166 lb 6.4 oz (75.5 kg)  07/09/20 167 lb 6.4 oz (75.9 kg)  04/30/20 166 lb (75.3 kg)     Lab Results  Component Value Date   WBC 7.2 07/09/2020   HGB 13.5 07/09/2020   HCT 40.8 07/09/2020   PLT 239 07/09/2020   GLUCOSE 88 07/09/2020   CHOL 154 01/01/2020   TRIG 72.0 01/01/2020   HDL 72.40 01/01/2020   LDLDIRECT  123.7 05/30/2013   LDLCALC 67 01/01/2020   ALT 14 07/09/2020   AST 21 07/09/2020   NA 139 07/09/2020   K 4.5 07/09/2020   CL 99 07/09/2020   CREATININE 0.62 07/09/2020   BUN 18 07/09/2020   CO2 26 07/09/2020   TSH 1.26 04/06/2019   HGBA1C 5.4 07/10/2019  DG Bone Density  Result Date: 10/30/2020 EXAM: DUAL X-RAY ABSORPTIOMETRY (DXA) FOR BONE MINERAL DENSITY IMPRESSION: Referring Physician:  Rosalita Chessman CHASE Your patient completed a bone mineral density test using GE Lunar iDXA system (analysis version: 16). Technologist: [place] PATIENT: Name: Nathaly, Dawkins Patient ID: [id number] Birth Date: 17-Jun-1945 Height: 64.0 in. Sex: Female Measured: 10/30/2020 Weight: 165.4 lbs. Indications: Advanced Age, Caucasian, Estrogen Deficient, Height Loss (781.91), History of Fracture (Adult) (V15.51), History of Osteopenia, Postmenopausal Fractures: Ankle, Left wrist, Right wrist Treatments: Fosamax, Multivitamin, Vitamin D (E933.5) ASSESSMENT: The BMD measured at Femur Neck Left is 0.685 g/cm2 with a T-score of -2.5. This patient is considered osteoporotic according to [facility]) criteria. The quality of the exam is good. Site Region Measured Date Measured Age YA BMD Significant CHANGE T-score DualFemur Neck Left  10/30/2020    73.6         -2.5    0.685 g/cm2 DualFemur Neck Left 04/22/2018 71.1 -2.5 0.691 g/cm2 * AP Spine  L1-L4      10/30/2020    73.6         -1.6    0.999 g/cm2 AP Spine L1-L4 04/22/2018 71.1 -1.6 0.999 g/cm2 * DualFemur Total Mean 10/30/2020 73.6 -1.8 0.784 g/cm2 * DualFemur Total Mean 04/22/2018 71.1 -1.5 0.820 g/cm2 * World Health Organization [facility]) criteria for post-menopausal, Caucasian Women: Normal       T-score at or above -1 SD Osteopenia   T-score between -1 and -2.5 SD Osteoporosis T-score at or below -2.5 SD RECOMMENDATION: 1. All patients should optimize calcium and vitamin D intake. 2. Consider FDA-approved medical therapies in postmenopausal women and men aged  70 years and older, based on the following: a. A hip or vertebral (clinical or morphometric) fracture. b. T-score = -2.5 at the femoral neck or spine after appropriate evaluation to exclude secondary causes. c. Low bone mass (T-score between -1.0 and -2.5 at the femoral neck or spine) and a 10-year probability of a hip fracture = 3% or a 10-year probability of a major osteoporosis-related fracture = 20% based on the [place]-adapted WHO algorithm. d. Clinician judgment and/or patient preferences may indicate treatment for people with 10-year fracture probabilities above or below these levels. FOLLOW-UP: Patients with diagnosis of osteoporosis or at high risk for fracture should have regular bone mineral density tests.? Patients eligible for Medicare are allowed routine testing every 2 years.? The testing frequency can be increased to one year for patients who have rapidly progressing disease, are receiving or discontinuing medical therapy to restore bone mass, or have additional risk factors. I have reviewed this study and agree with the findings. [facility] Electronically Signed   By: Lowella Grip III M.D.   On: 10/30/2020 08:38     Assessment & Plan:  Plan   No orders of the defined types were placed in this encounter.   Problem List Items Addressed This Visit      Unprioritized   Age-related osteoporosis without current pathological fracture - Primary    bmd reviewed with pt Pt would like to see if her ins will cover prolia Check labs       Relevant Orders   Lipid panel   Comprehensive metabolic panel   Essential hypertension    Well controlled, no changes to meds. Encouraged heart healthy diet such as the DASH diet and exercise as tolerated.       Hyperlipidemia    Encouraged heart healthy diet, increase exercise, avoid trans fats,  consider a krill oil cap daily      Relevant Orders   Lipid panel   Comprehensive metabolic panel    Other Visit Diagnoses    Acute  pain of right knee       Relevant Orders   DG Knee Complete 4 Views Right      Follow-up: Return in about 6 months (around 05/06/2021), or if symptoms worsen or fail to improve, for annual exam, fasting.   I,Gordon Zheng,acting as a Education administrator for [employer], DO.,have documented all relevant documentation on the behalf of Ann Held, DO,as directed by  Ann Held, DO while in the presence of [place], DO, have reviewed all documentation for this visit. The documentation on 11/04/20 for the exam, diagnosis, procedures, and orders are all accurate and complete.

## 2020-11-04 NOTE — Patient Instructions (Signed)
Osteoporosis  Osteoporosis happens when the bones become thin and less dense than normal. Osteoporosis makes bones more brittle and fragile and more likely to break (fracture). Over time, osteoporosis can cause your bones to become so weak that they fracture after a minor fall. Bones in the hip, wrist, and spine are most likely to fracture due to osteoporosis. What are the causes? The exact cause of this condition is not known. What increases the risk? You are more likely to develop this condition if you:  Have family members with this condition.  Have poor nutrition.  Use the following: ? Steroid medicines, such as prednisone. ? Anti-seizure medicines. ? Nicotine or tobacco, such as cigarettes, e-cigarettes, and chewing tobacco.  Are female.  Are age 50 or older.  Are not physically active (are sedentary).  Are of European or Asian descent.  Have a small body frame. What are the signs or symptoms? A fracture might be the first sign of osteoporosis, especially if the fracture results from a fall or injury that usually would not cause a bone to break. Other signs and symptoms include:  Pain in the neck or low back.  Stooped posture.  Loss of height. How is this diagnosed? This condition may be diagnosed based on:  Your medical history.  A physical exam.  A bone mineral density test, also called a DXA or DEXA test (dual-energy X-ray absorptiometry test). This test uses X-rays to measure the amount of minerals in your bones. How is this treated? This condition may be treated by:  Making lifestyle changes, such as: ? Including foods with more calcium and vitamin D in your diet. ? Doing weight-bearing and muscle-strengthening exercises. ? Stopping tobacco use. ? Limiting alcohol intake.  Taking medicine to slow the process of bone loss or to increase bone density.  Taking daily supplements of calcium and vitamin D.  Taking hormone replacement medicines, such as  estrogen for women and testosterone for men.  Monitoring your levels of calcium and vitamin D. The goal of treatment is to strengthen your bones and lower your risk for a fracture. Follow these instructions at home: Eating and drinking Include calcium and vitamin D in your diet. Calcium is important for bone health, and vitamin D helps your body absorb calcium. Good sources of calcium and vitamin D include:  Certain fatty fish, such as salmon and tuna.  Products that have calcium and vitamin D added to them (are fortified), such as fortified cereals.  Egg yolks.  Cheese.  Liver.   Activity Do exercises as told by your health care provider. Ask your health care provider what exercises and activities are safe for you. You should do:  Exercises that make you work against gravity (weight-bearing exercises), such as tai chi, yoga, or walking.  Exercises to strengthen muscles, such as lifting weights. Lifestyle  Do not drink alcohol if: ? Your health care provider tells you not to drink. ? You are pregnant, may be pregnant, or are planning to become pregnant.  If you drink alcohol: ? Limit how much you use to:  0-1 drink a day for women.  0-2 drinks a day for men.  Know how much alcohol is in your drink. In the [place] one drink equals one 12 oz bottle of beer (355 mL), one 5 oz glass of wine (148 mL), or one 1 oz glass of hard liquor (44 mL).  Do not use any products that contain nicotine or tobacco, such as cigarettes, e-cigarettes, and chewing tobacco.   If you need help quitting, ask your health care provider. Preventing falls  Use devices to help you move around (mobility aids) as needed, such as canes, walkers, scooters, or crutches.  Keep rooms well-lit and clutter-free.  Remove tripping hazards from walkways, including cords and throw rugs.  Install grab bars in bathrooms and safety rails on stairs.  Use rubber mats in the bathroom and other areas that are often wet or  slippery.  Wear closed-toe shoes that fit well and support your feet. Wear shoes that have rubber soles or low heels.  Review your medicines with your health care provider. Some medicines can cause dizziness or changes in blood pressure, which can increase your risk of falling. General instructions  Take over-the-counter and prescription medicines only as told by your health care provider.  Keep all follow-up visits. This is important. Contact a health care provider if:  You have never been screened for osteoporosis and you are: ? A woman who is age 65 or older. ? A man who is age 70 or older. Get help right away if:  You fall or injure yourself. Summary  Osteoporosis is thinning and loss of density in your bones. This makes bones more brittle and fragile and more likely to break (fracture),even with minor falls.  The goal of treatment is to strengthen your bones and lower your risk for a fracture.  Include calcium and vitamin D in your diet. Calcium is important for bone health, and vitamin D helps your body absorb calcium.  Talk with your health care provider about screening for osteoporosis if you are a woman who is age 74 or older, or a man who is age 74 or older. This information is not intended to replace advice given to you by your health care provider. Make sure you discuss any questions you have with your health care provider. Document Revised: 11/02/2019 Document Reviewed: 11/02/2019 Elsevier Patient Education  2021 Elsevier Inc.  

## 2020-11-04 NOTE — Assessment & Plan Note (Signed)
bmd reviewed with pt Pt would like to see if her ins will cover prolia Check labs

## 2020-11-05 ENCOUNTER — Telehealth: Payer: Self-pay

## 2020-11-05 ENCOUNTER — Encounter: Payer: Self-pay | Admitting: Family Medicine

## 2020-11-05 ENCOUNTER — Other Ambulatory Visit: Payer: Self-pay | Admitting: Family Medicine

## 2020-11-05 DIAGNOSIS — S82001A Unspecified fracture of right patella, initial encounter for closed fracture: Secondary | ICD-10-CM

## 2020-11-05 LAB — COMPREHENSIVE METABOLIC PANEL
ALT: 13 U/L (ref 0–35)
AST: 19 U/L (ref 0–37)
Albumin: 4.6 g/dL (ref 3.5–5.2)
Alkaline Phosphatase: 44 U/L (ref 39–117)
BUN: 14 mg/dL (ref 6–23)
CO2: 30 mEq/L (ref 19–32)
Calcium: 10.5 mg/dL (ref 8.4–10.5)
Chloride: 101 mEq/L (ref 96–112)
Creatinine, Ser: 0.61 mg/dL (ref 0.40–1.20)
GFR: 88.53 mL/min (ref >60.00)
Glucose, Bld: 92 mg/dL (ref 70–99)
Potassium: 4.3 mEq/L (ref 3.5–5.1)
Sodium: 141 mEq/L (ref 135–145)
Total Bilirubin: 0.6 mg/dL (ref 0.2–1.2)
Total Protein: 7 g/dL (ref 6.0–8.3)

## 2020-11-05 LAB — LIPID PANEL
Cholesterol: 205 mg/dL — ABNORMAL HIGH (ref 0–200)
HDL: 87.6 mg/dL (ref >39.00)
LDL Cholesterol: 102 mg/dL — ABNORMAL HIGH (ref 0–99)
NonHDL: 117.43
Total CHOL/HDL Ratio: 2
Triglycerides: 79 mg/dL (ref 0.0–149.0)
VLDL: 15.8 mg/dL (ref 0.0–40.0)

## 2020-11-05 NOTE — Telephone Encounter (Signed)
-----   Message from Ann Held, DO sent at 11/04/2020 10:03 AM EDT ----- Pt would like to see if she can have prolia--- she did not want evenity

## 2020-11-05 NOTE — Telephone Encounter (Signed)
Clinical info submitted to insurance company. Waiting on summary of benefits to determine coverage. Will call patient to discuss once received.  

## 2020-11-20 DIAGNOSIS — M25561 Pain in right knee: Secondary | ICD-10-CM | POA: Diagnosis not present

## 2020-11-21 ENCOUNTER — Other Ambulatory Visit: Payer: Self-pay | Admitting: Family Medicine

## 2020-11-21 DIAGNOSIS — I10 Essential (primary) hypertension: Secondary | ICD-10-CM

## 2020-12-03 ENCOUNTER — Other Ambulatory Visit: Payer: Self-pay

## 2020-12-03 ENCOUNTER — Ambulatory Visit
Admission: RE | Admit: 2020-12-03 | Discharge: 2020-12-03 | Disposition: A | Discharge status: Home / Self Care | Payer: Medicare Other | Source: Ambulatory Visit | Attending: Family Medicine | Admitting: Family Medicine

## 2020-12-03 DIAGNOSIS — Z1231 Encounter for screening mammogram for malignant neoplasm of breast: Secondary | ICD-10-CM | POA: Diagnosis not present

## 2020-12-13 ENCOUNTER — Encounter: Payer: Self-pay | Admitting: Gastroenterology

## 2021-01-13 DIAGNOSIS — H35372 Puckering of macula, left eye: Secondary | ICD-10-CM | POA: Diagnosis not present

## 2021-01-13 DIAGNOSIS — H02423 Myogenic ptosis of bilateral eyelids: Secondary | ICD-10-CM | POA: Diagnosis not present

## 2021-01-13 DIAGNOSIS — H26493 Other secondary cataract, bilateral: Secondary | ICD-10-CM | POA: Diagnosis not present

## 2021-01-13 DIAGNOSIS — H40023 Open angle with borderline findings, high risk, bilateral: Secondary | ICD-10-CM | POA: Diagnosis not present

## 2021-01-13 DIAGNOSIS — H524 Presbyopia: Secondary | ICD-10-CM | POA: Diagnosis not present

## 2021-01-30 ENCOUNTER — Encounter: Payer: Self-pay | Admitting: Gastroenterology

## 2021-03-18 ENCOUNTER — Ambulatory Visit (INDEPENDENT_AMBULATORY_CARE_PROVIDER_SITE_OTHER): Payer: Medicare Other

## 2021-03-18 VITALS — Ht 65.0 in | Wt 162.3 lb

## 2021-03-18 DIAGNOSIS — Z0000 Encounter for general adult medical examination without abnormal findings: Secondary | ICD-10-CM

## 2021-03-18 NOTE — Patient Instructions (Signed)
Tina [name] , Thank you for taking time to complete your Medicare Wellness Visit. I appreciate your ongoing commitment to your health goals. Please review the following plan we discussed and let me know if I can assist you in the future.   Screening recommendations/referrals: Colonoscopy: Scheduled for 05/15/2021 Mammogram: Completed 12/03/2020-Due 12/03/2021 Bone Density: Completed 10/30/2020-Due 10/31/2022 Recommended yearly ophthalmology/optometry visit for glaucoma screening and checkup Recommended yearly dental visit for hygiene and checkup  Vaccinations: Influenza vaccine: Due-May obtain vaccine at our office or your local pharmacy. Pneumococcal vaccine: Up to date Tdap vaccine: Discuss with pharmacy Shingles vaccine: Discuss with pharmacy   Covid-19:Booster available at the pharmacy.  Advanced directives: Please bring a copy of Living Will and/or [place] for your chart once completed.   Conditions/risks identified: See problem list  Next appointment: Follow up in one year for your annual wellness visit 03/20/2022 @ 2:20   Preventive Care 65 Years and Older, Female Preventive care refers to lifestyle choices and visits with your health care provider that can promote health and wellness. What does preventive care include? A yearly physical exam. This is also called an annual well check. Dental exams once or twice a year. Routine eye exams. Ask your health care provider how often you should have your eyes checked. Personal lifestyle choices, including: Daily care of your teeth and gums. Regular physical activity. Eating a healthy diet. Avoiding tobacco and drug use. Limiting alcohol use. Practicing safe sex. Taking low-dose aspirin every day. Taking vitamin and mineral supplements as recommended by your health care provider. What happens during an annual well check? The services and screenings done by your health care provider during your annual well check will  depend on your age, overall health, lifestyle risk factors, and family history of disease. Counseling  Your health care provider may ask you questions about your: Alcohol use. Tobacco use. Drug use. Emotional well-being. Home and relationship well-being. Sexual activity. Eating habits. History of falls. Memory and ability to understand (cognition). Work and work Statistician. Reproductive health. Screening  You may have the following tests or measurements: Height, weight, and BMI. Blood pressure. Lipid and cholesterol levels. These may be checked every 5 years, or more frequently if you are over 45 years old. Skin check. Lung cancer screening. You may have this screening every year starting at age 60 if you have a 30-pack-year history of smoking and currently smoke or have quit within the past 15 years. Fecal occult blood test (FOBT) of the stool. You may have this test every year starting at age 29. Flexible sigmoidoscopy or colonoscopy. You may have a sigmoidoscopy every 5 years or a colonoscopy every 10 years starting at age 56. Hepatitis C blood test. Hepatitis B blood test. Sexually transmitted disease (STD) testing. Diabetes screening. This is done by checking your blood sugar (glucose) after you have not eaten for a while (fasting). You may have this done every 1-3 years. Bone density scan. This is done to screen for osteoporosis. You may have this done starting at age 40. Mammogram. This may be done every 1-2 years. Talk to your health care provider about how often you should have regular mammograms. Talk with your health care provider about your test results, treatment options, and if necessary, the need for more tests. Vaccines  Your health care provider may recommend certain vaccines, such as: Influenza vaccine. This is recommended every year. Tetanus, diphtheria, and acellular pertussis (Tdap, Td) vaccine. You may need a Td booster every 10  years. Zoster vaccine. You may  need this after age 74. Pneumococcal 13-valent conjugate (PCV13) vaccine. One dose is recommended after age 27. Pneumococcal polysaccharide (PPSV23) vaccine. One dose is recommended after age 91. Talk to your health care provider about which screenings and vaccines you need and how often you need them. This information is not intended to replace advice given to you by your health care provider. Make sure you discuss any questions you have with your health care provider. Document Released: 06/14/2015 Document Revised: 02/05/2016 Document Reviewed: 03/19/2015 Elsevier Interactive Patient Education  2017 [place] Prevention in the Home Falls can cause injuries. They can happen to people of all ages. There are many things you can do to make your home safe and to help prevent falls. What can I do on the outside of my home? Regularly fix the edges of walkways and driveways and fix any cracks. Remove anything that might make you trip as you walk through a door, such as a raised step or threshold. Trim any bushes or trees on the path to your home. Use bright outdoor lighting. Clear any walking paths of anything that might make someone trip, such as rocks or tools. Regularly check to see if handrails are loose or broken. Make sure that both sides of any steps have handrails. Any raised decks and porches should have guardrails on the edges. Have any leaves, snow, or ice cleared regularly. Use sand or salt on walking paths during winter. Clean up any spills in your garage right away. This includes oil or grease spills. What can I do in the bathroom? Use night lights. Install grab bars by the toilet and in the tub and shower. Do not use towel bars as grab bars. Use non-skid mats or decals in the tub or shower. If you need to sit down in the shower, use a plastic, non-slip stool. Keep the floor dry. Clean up any water that spills on the floor as soon as it happens. Remove soap buildup in  the tub or shower regularly. Attach bath mats securely with double-sided non-slip rug tape. Do not have throw rugs and other things on the floor that can make you trip. What can I do in the bedroom? Use night lights. Make sure that you have a light by your bed that is easy to reach. Do not use any sheets or blankets that are too big for your bed. They should not hang down onto the floor. Have a firm chair that has side arms. You can use this for support while you get dressed. Do not have throw rugs and other things on the floor that can make you trip. What can I do in the kitchen? Clean up any spills right away. Avoid walking on wet floors. Keep items that you use a lot in easy-to-reach places. If you need to reach something above you, use a strong step stool that has a grab bar. Keep electrical cords out of the way. Do not use floor polish or wax that makes floors slippery. If you must use wax, use non-skid floor wax. Do not have throw rugs and other things on the floor that can make you trip. What can I do with my stairs? Do not leave any items on the stairs. Make sure that there are handrails on both sides of the stairs and use them. Fix handrails that are broken or loose. Make sure that handrails are as long as the stairways. Check any carpeting to make sure  that it is firmly attached to the stairs. Fix any carpet that is loose or worn. Avoid having throw rugs at the top or bottom of the stairs. If you do have throw rugs, attach them to the floor with carpet tape. Make sure that you have a light switch at the top of the stairs and the bottom of the stairs. If you do not have them, ask someone to add them for you. What else can I do to help prevent falls? Wear shoes that: Do not have high heels. Have rubber bottoms. Are comfortable and fit you well. Are closed at the toe. Do not wear sandals. If you use a stepladder: Make sure that it is fully opened. Do not climb a closed  stepladder. Make sure that both sides of the stepladder are locked into place. Ask someone to hold it for you, if possible. Clearly mark and make sure that you can see: Any grab bars or handrails. First and last steps. Where the edge of each step is. Use tools that help you move around (mobility aids) if they are needed. These include: Canes. Walkers. Scooters. Crutches. Turn on the lights when you go into a dark area. Replace any light bulbs as soon as they burn out. Set up your furniture so you have a clear path. Avoid moving your furniture around. If any of your floors are uneven, fix them. If there are any pets around you, be aware of where they are. Review your medicines with your doctor. Some medicines can make you feel dizzy. This can increase your chance of falling. Ask your doctor what other things that you can do to help prevent falls. This information is not intended to replace advice given to you by your health care provider. Make sure you discuss any questions you have with your health care provider. Document Released: 03/14/2009 Document Revised: 10/24/2015 Document Reviewed: 06/22/2014 Elsevier Interactive Patient Education  2017 [employer].

## 2021-03-18 NOTE — Progress Notes (Signed)
Subjective:   Tina [name] is a 75 y.o. female who presents for Medicare Annual (Subsequent) preventive examination.  I connected with Hollan today by telephone and verified that I am speaking with the correct person using two identifiers. Location patient: home Location provider: work Persons participating in the virtual visit: patient, Marine scientist.    I discussed the limitations, risks, security and privacy concerns of performing an evaluation and management service by telephone and the availability of in person appointments. I also discussed with the patient that there may be a patient responsible charge related to this service. The patient expressed understanding and verbally consented to this telephonic visit.    Interactive audio and video telecommunications were attempted between this provider and patient, however failed, due to patient having technical difficulties OR patient did not have access to video capability.  We continued and completed visit with audio only.  Some vital signs may be absent or patient reported.   Time Spent with patient on telephone encounter: 35 minutes   Review of Systems     Cardiac Risk Factors include: advanced age (>72men, >66 women);dyslipidemia;hypertension     Objective:    Today's Vitals   03/18/21 1542  Weight: 162 lb 4.8 oz (73.6 kg)  Height: 5\' 5"  (1.651 m)   Body mass index is 27.01 kg/m.  Advanced Directives 03/18/2021 07/12/2019 07/12/2019 07/10/2019 02/27/2019 02/24/2018 02/23/2017  Does Patient Have a Medical Advance Directive? No No No No No No No  Would patient like information on creating a medical advance directive? No - Patient declined No - Patient declined No - Patient declined - No - Patient declined Yes (MAU/Ambulatory/Procedural Areas - Information given) Yes (MAU/Ambulatory/Procedural Areas - Information given)    Current Medications (verified) Outpatient Encounter Medications as of 03/18/2021  Medication Sig   alendronate  (FOSAMAX) 70 MG tablet TAKE ONE TABLET BY MOUTH ONCE WEEKLY ON EMPTY STOMACH WITH FULL GLASS OF WATER   aspirin 81 MG tablet Take 81 mg by mouth daily.   Calcium Carbonate-Vit D-Min (CALCIUM 1200 PO) Take 1,200 mg by mouth daily. 1 tab po qd   Multiple Vitamin (MULTIVITAMIN) capsule Take 1 capsule by mouth daily.   Omega-3 Fatty Acids (FISH OIL) 1000 MG CAPS Take 2,000 mg by mouth daily.   ramipril (ALTACE) 5 MG capsule TAKE 1 CAPSULE(5 MG) BY MOUTH DAILY   rosuvastatin (CRESTOR) 20 MG tablet TAKE 1/2 TABLET(10 MG) BY MOUTH DAILY   No facility-administered encounter medications on file as of 03/18/2021.    Allergies (verified) Patient has no known allergies.   History: Past Medical History:  Diagnosis Date   Allergy    Cataract    Fatty liver    Goiter    HTN (hypertension)    Hyperlipidemia    Myocardial infarct ([place]) hx of 2004   Osteoporosis    Sleep apnea    no cpap   Wrist fracture    Past Surgical History:  Procedure Laterality Date   CARDIAC CATHETERIZATION  2004   stent   CATARACT EXTRACTION, BILATERAL  05/2020   EYE SURGERY     PROCTOSCOPY N/A 07/12/2019   Procedure: RIGID PROCTOSCOPY;  Surgeon: Michael Boston, MD;  Location: [facility];  Service: General;  Laterality: N/A;   sleep study  2012   Family History  Problem Relation Age of Onset   Hypertension Mother    Heart disease Mother 60       MI--- died from #3 at 21   Ovarian cancer Mother  Hypertension Father    Cancer Father 19       ovarian   Heart disease Father    Hypertension Sister    Cancer Sister 64       brain tumor   Coronary artery disease Other    Lung cancer Other    Ovarian cancer Other    Diabetes Maternal Grandmother    Hypertension Brother    Arthritis Brother    Colon cancer Neg Hx    Colon polyps Neg Hx    Esophageal cancer Neg Hx    Stomach cancer Neg Hx    Rectal cancer Neg Hx    Social History   Socioeconomic History   Marital status: Divorced    Spouse name: Not on  file   Number of children: Not on file   Years of education: Not on file   Highest education level: Not on file  Occupational History   Occupation: nurse    Employer: OTHER    Comment: [employer]   Tobacco Use   Smoking status: Former    Packs/day: 1.00    Years: 30.00    Pack years: 30.00    Types: Cigarettes    Quit date: 12/13/2002    Years since quitting: 18.2   Smokeless tobacco: Former    Quit date: 06/01/2002  Vaping Use   Vaping Use: Never used  Substance and Sexual Activity   Alcohol use: Yes    Comment: 1-2 glasses of wine with dinner   Drug use: No   Sexual activity: Not Currently    Partners: Male  Other Topics Concern   Not on file  Social History Narrative   Occupation:[employer]   Divorced   Former Smoker quit 2004   Drug use-no   Regular exercise-treadmill 3x a week   Originially from [place]   Social Determinants of Radio broadcast assistant Strain: Low Risk    Difficulty of Paying Living Expenses: Not hard at all  Food Insecurity: No Food Insecurity   Worried About Charity fundraiser in the Last Year: Never true   Arboriculturist in the Last Year: Never true  Transportation Needs: No Transportation Needs   Lack of Transportation (Medical): No   Lack of Transportation (Non-Medical): No  Physical Activity: Sufficiently Active   Days of Exercise per Week: 7 days   Minutes of Exercise per Session: 30 min  Stress: No Stress Concern Present   Feeling of Stress : Not at all  Social Connections: Socially Isolated   Frequency of Communication with Friends and Family: Once a week   Frequency of Social Gatherings with Friends and Family: Once a week   Attends Religious Services: Never   Marine scientist or Organizations: No   Attends Music therapist: Never   Marital Status: Divorced    Tobacco Counseling Counseling given: Not Answered   Clinical Intake:  Pre-visit preparation completed: Yes  Pain : No/denies  pain     BMI - recorded: 27.01 Nutritional Status: BMI 25 -29 Overweight Nutritional Risks: None Diabetes: No  How often do you need to have someone help you when you read instructions, pamphlets, or other written materials from your doctor or pharmacy?: 1 - Never  Diabetic?No  Interpreter Needed?: No  Information entered by :: Caroleen Hamman LPN   Activities of Daily Living In your present state of health, do you have any difficulty performing the following activities: 03/18/2021 11/04/2020  Hearing? N N  Vision? N N  Difficulty concentrating or making decisions? N N  Walking or climbing stairs? N Y  Dressing or bathing? N N  Doing errands, shopping? N N  Preparing Food and eating ? N -  Using the Toilet? N -  In the past six months, have you accidently leaked urine? Y -  Do you have problems with loss of bowel control? N -  Managing your Medications? N -  Managing your Finances? N -  Housekeeping or managing your Housekeeping? N -  Some recent data might be hidden    Patient Care Team: Carollee Herter, Alferd Apa, DO as PCP - General Adron Bene, DDS as Consulting Physician (Dentistry) Jerline Pain Mingo Amber, DO as Consulting Physician (Optometry) Fay Records, MD as Consulting Physician (Cardiology) Jacelyn Pi, MD as Referring Physician (Endocrinology) [name] Kirke Corin, MD as Consulting Physician (Gastroenterology) Michael Boston, MD as Consulting Physician (General Surgery)  Indicate any recent Medical Services you may have received from other than [facility] providers in the past year (date may be approximate).     Assessment:   This is a routine wellness examination for [place].  Hearing/Vision screen Hearing Screening - Comments:: No issues Vision Screening - Comments:: Last eye exam-12/2020-Dr. Jerline Pain  Dietary issues and exercise activities discussed: Current Exercise Habits: Home exercise routine, Type of exercise: walking;treadmill, Time (Minutes): 30, Frequency  (Times/Week): 7, Weekly Exercise (Minutes/Week): 210, Intensity: Mild, Exercise limited by: None identified   Goals Addressed             This Visit's Progress    Continue healthy diet   On track    Patient Stated       Drink more water       Depression Screen PHQ 2/9 Scores 03/18/2021 04/30/2020 02/27/2019 02/24/2018 02/23/2017 04/13/2016 10/11/2015  PHQ - 2 Score 0 0 0 0 0 0 0    Fall Risk Fall Risk  03/18/2021 04/30/2020 02/27/2019 02/24/2018 02/23/2017  Falls in the past year? 1 1 0 No No  Number falls in past yr: 1 1 0 - -  Injury with Fall? 1 1 0 - -  Risk for fall due to : History of fall(s) Impaired vision - - -  Risk for fall due to: Comment - - - - -  Follow up Falls prevention discussed - - - -    FALL RISK PREVENTION PERTAINING TO THE HOME:  Any stairs in or around the home? Yes  If so, are there any without handrails? No  Home free of loose throw rugs in walkways, pet beds, electrical cords, etc? Yes  Adequate lighting in your home to reduce risk of falls? Yes   ASSISTIVE DEVICES UTILIZED TO PREVENT FALLS:  Life alert? No  Use of a cane, walker or w/c? No  Grab bars in the bathroom? No  Shower chair or bench in shower? No  Elevated toilet seat or a handicapped toilet? No   TIMED UP AND GO:  Was the test performed? No . Phone visit   Cognitive Function:Normal cognitive status assessed bythis Nurse Health Advisor. No abnormalities found.          Immunizations Immunization History  Administered Date(s) Administered   Fluad Quad(high Dose 65+) 03/14/2019   Influenza, High Dose Seasonal PF 02/25/2015, 04/13/2016, 02/24/2017, 02/28/2018, 03/14/2019   Influenza,inj,Quad PF,6+ Mos 06/05/2014   Influenza-Unspecified 03/30/2017, 03/22/2020   PFIZER(Purple Top)SARS-COV-2 Vaccination 08/26/2019, 09/16/2019, 10/17/2020   Pneumococcal Conjugate-13 02/25/2015   Pneumococcal Polysaccharide-23 01/03/2014   Tdap 10/14/2010  Zoster, Live 10/14/2010    TDAP  status: Due, Education has been provided regarding the importance of this vaccine. Advised may receive this vaccine at local pharmacy or Health Dept. Aware to provide a copy of the vaccination record if obtained from local pharmacy or Health Dept. Verbalized acceptance and understanding.  Flu Vaccine status: Due, Education has been provided regarding the importance of this vaccine. Advised may receive this vaccine at local pharmacy or Health Dept. Aware to provide a copy of the vaccination record if obtained from local pharmacy or Health Dept. Verbalized acceptance and understanding.  Pneumococcal vaccine status: Up to date  Covid-19 vaccine status: Information provided on how to obtain vaccines. Booster due  Qualifies for Shingles Vaccine? Yes   Zostavax completed Yes   Shingrix Completed?: No.    Education has been provided regarding the importance of this vaccine. Patient has been advised to call insurance company to determine out of pocket expense if they have not yet received this vaccine. Advised may also receive vaccine at local pharmacy or Health Dept. Verbalized acceptance and understanding.  Screening Tests Health Maintenance  Topic Date Due   Zoster Vaccines- Shingrix (1 of 2) Never done   TETANUS/TDAP  10/13/2020   INFLUENZA VACCINE  12/30/2020   COVID-19 Vaccine (4 - Booster for Pfizer series) 02/17/2021   Fecal DNA (Cologuard)  03/14/2021   MAMMOGRAM  12/03/2021   DEXA SCAN  10/31/2022   Hepatitis C Screening  Completed   HPV VACCINES  Aged Out    Health Maintenance  Health Maintenance Due  Topic Date Due   Zoster Vaccines- Shingrix (1 of 2) Never done   TETANUS/TDAP  10/13/2020   INFLUENZA VACCINE  12/30/2020   COVID-19 Vaccine (4 - Booster for Pfizer series) 02/17/2021   Fecal DNA (Cologuard)  03/14/2021    Colorectal cancer screening: Colonoscopy scheduled for 05/15/2021  Mammogram status: Completed bilateral 12/03/2020. Repeat every year  Bone Density status:  Completed 10/30/2020. Results reflect: Bone density results: OSTEOPOROSIS. Repeat every 2 years.  Lung Cancer Screening: (Low Dose CT Chest recommended if Age 9-80 years, 30 pack-year currently smoking OR have quit w/in 15years.) does not qualify.    Additional Screening:  Hepatitis C Screening: Completed 02/25/2015  Vision Screening: Recommended annual ophthalmology exams for early detection of glaucoma and other disorders of the eye. Is the patient up to date with their annual eye exam?  Yes  Who is the provider or what is the name of the office in which the patient attends annual eye exams? Dr. Jerline Pain   Dental Screening: Recommended annual dental exams for proper oral hygiene  Community Resource Referral / Chronic Care Management: CRR required this visit?  No   CCM required this visit?  No      Plan:     I have personally reviewed and noted the following in the patient's chart:   Medical and social history Use of alcohol, tobacco or illicit drugs  Current medications and supplements including opioid prescriptions.  Functional ability and status Nutritional status Physical activity Advanced directives List of other physicians Hospitalizations, surgeries, and ER visits in previous 12 months Vitals Screenings to include cognitive, depression, and falls Referrals and appointments  In addition, I have reviewed and discussed with patient certain preventive protocols, quality metrics, and best practice recommendations. A written personalized care plan for preventive services as well as general preventive health recommendations were provided to patient.   Due to this being a telephonic visit, the after visit summary with  patients personalized plan was offered to patient via mail or my-chart. Patient would like to access on my-chart.   Marta Antu, LPN   07/57/[id number]  Nurse Health Advisor  Nurse Notes: None

## 2021-04-29 ENCOUNTER — Other Ambulatory Visit: Payer: Self-pay

## 2021-04-29 DIAGNOSIS — E785 Hyperlipidemia, unspecified: Secondary | ICD-10-CM

## 2021-04-29 MED ORDER — ROSUVASTATIN CALCIUM 20 MG PO TABS: ORAL_TABLET | ORAL | 1 refills | Status: DC

## 2021-05-01 ENCOUNTER — Ambulatory Visit (AMBULATORY_SURGERY_CENTER): Payer: Medicare Other | Admitting: *Deleted

## 2021-05-01 ENCOUNTER — Encounter: Payer: Self-pay | Admitting: Gastroenterology

## 2021-05-01 ENCOUNTER — Other Ambulatory Visit: Payer: Self-pay

## 2021-05-01 VITALS — Ht 65.0 in | Wt 161.0 lb

## 2021-05-01 DIAGNOSIS — Z86010 Personal history of colonic polyps: Secondary | ICD-10-CM

## 2021-05-01 MED ORDER — NA SULFATE-K SULFATE-MG SULF 17.5-3.13-1.6 GM/177ML PO SOLN: 1.0000 | Freq: Once | ORAL | 0 refills | Status: AC

## 2021-05-01 NOTE — Progress Notes (Signed)

## 2021-05-02 ENCOUNTER — Telehealth: Payer: Self-pay

## 2021-05-02 NOTE — Telephone Encounter (Signed)
Pt Reverified for [place], awaiting Summary of Benefits. Will call and schedule for appointment once received and reviewed with pt.

## 2021-05-14 ENCOUNTER — Encounter: Payer: Self-pay | Admitting: Certified Registered Nurse Anesthetist

## 2021-05-15 ENCOUNTER — Other Ambulatory Visit: Payer: Self-pay

## 2021-05-15 ENCOUNTER — Encounter: Payer: Self-pay | Admitting: Gastroenterology

## 2021-05-15 ENCOUNTER — Ambulatory Visit (AMBULATORY_SURGERY_CENTER): Payer: Medicare Other | Admitting: Gastroenterology

## 2021-05-15 VITALS — BP 129/52 | HR 56 | Temp 97.3°F | Resp 14 | Ht 65.0 in

## 2021-05-15 DIAGNOSIS — Z86010 Personal history of colonic polyps: Secondary | ICD-10-CM | POA: Diagnosis not present

## 2021-05-15 DIAGNOSIS — D122 Benign neoplasm of ascending colon: Secondary | ICD-10-CM | POA: Diagnosis not present

## 2021-05-15 MED ORDER — SODIUM CHLORIDE 0.9 % IV SOLN: 500.0000 mL | Freq: Once | INTRAVENOUS | Status: DC

## 2021-05-15 NOTE — Progress Notes (Signed)
History and Physical:  This patient presents for endoscopic testing for: Encounter Diagnosis  Name Primary?   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 She has occasional constipation, otherwise no chronic GI symptoms.  ROS: Patient denies chest pain or cough   Past Medical History: Past Medical History:  Diagnosis Date   Allergy    seasonal   Cataract    removed both eyes   Fatty liver    Goiter    treated   HTN (hypertension)    Hyperlipidemia    Myocardial infarct ([place]) hx of 2004   NSTEMI with stent   Osteoporosis    Patellar fracture    Sleep apnea    no cpap   Wrist fracture    right wrist     Past Surgical History: Past Surgical History:  Procedure Laterality Date   CARDIAC CATHETERIZATION  2004   stent   CATARACT EXTRACTION, BILATERAL  05/2020   COLONOSCOPY     POLYPECTOMY     PROCTOSCOPY N/A 07/12/2019   Procedure: RIGID PROCTOSCOPY;  Surgeon: Michael Boston, MD;  Location: [facility];  Service: General;  Laterality: N/A;   sleep study  2012    Allergies: No Known Allergies  Outpatient Meds: Current Outpatient Medications  Medication Sig Dispense Refill   aspirin 81 MG tablet Take 81 mg by mouth daily.     Calcium Carbonate-Vit D-Min (CALCIUM 1200 PO) Take 1,200 mg by mouth daily. 1 tab po qd     Multiple Vitamin (MULTIVITAMIN) capsule Take 1 capsule by mouth daily.     Omega-3 Fatty Acids (FISH OIL) 1000 MG CAPS Take 2,000 mg by mouth daily.     ramipril (ALTACE) 5 MG capsule TAKE 1 CAPSULE(5 MG) BY MOUTH DAILY 90 capsule 1   rosuvastatin (CRESTOR) 20 MG tablet TAKE 1/2 TABLET(10 MG) BY MOUTH DAILY 45 tablet 1   alendronate (FOSAMAX) 70 MG tablet TAKE ONE TABLET BY MOUTH ONCE WEEKLY ON EMPTY STOMACH WITH FULL GLASS OF WATER 12 tablet 1   Current Facility-Administered Medications  Medication Dose Route Frequency Provider Last Rate Last Admin   0.9 %  sodium chloride infusion  500 mL  Intravenous Once Danis, Kirke Corin, MD          ___________________________________________________________________ Objective   Exam:  BP 138/62    Pulse (!) 53    Temp (!) 97.3 F (36.3 C)    Ht 5\' 5"  (1.651 m)    SpO2 99%    BMI 26.79 kg/m   CV: RRR without murmur, S1/S2 Resp: clear to auscultation bilaterally, normal RR and effort noted GI: soft, no tenderness, with active bowel sounds.   Assessment: Encounter Diagnosis  Name Primary?   Personal history of colonic polyps Yes     Plan: Colonoscopy  The benefits and risks of the planned procedure were described in detail with the patient or (when appropriate) their health care proxy.  Risks were outlined as including, but not limited to, bleeding, infection, perforation, adverse medication reaction leading to cardiac or pulmonary decompensation, pancreatitis (if ERCP).  The limitation of incomplete mucosal visualization was also discussed.  No guarantees or warranties were given.    The patient is appropriate for an endoscopic procedure in the ambulatory setting.   - [name], MD

## 2021-05-15 NOTE — Progress Notes (Signed)
Called to room to assist during endoscopic procedure.  Patient ID and intended procedure confirmed with present staff. Received instructions for my participation in the procedure from the performing physician.  

## 2021-05-15 NOTE — Progress Notes (Signed)
Report given to PACU, vss 

## 2021-05-15 NOTE — Patient Instructions (Signed)
YOU HAD AN ENDOSCOPIC PROCEDURE TODAY AT THE [facility]:   Refer to the procedure report that was given to you for any specific questions about what was found during the examination.  If the procedure report does not answer your questions, please call your gastroenterologist to clarify.  If you requested that your care partner not be given the details of your procedure findings, then the procedure report has been included in a sealed envelope for you to review at your convenience later. ° °**Handouts given on polyps and diverticulosis** ° °YOU SHOULD EXPECT: Some feelings of bloating in the abdomen. Passage of more gas than usual.  Walking can help get rid of the air that was put into your GI tract during the procedure and reduce the bloating. If you had a lower endoscopy (such as a colonoscopy or flexible sigmoidoscopy) you may notice spotting of blood in your stool or on the toilet paper. If you underwent a bowel prep for your procedure, you may not have a normal bowel movement for a few days. ° °Please Note:  You might notice some irritation and congestion in your nose or some drainage.  This is from the oxygen used during your procedure.  There is no need for concern and it should clear up in a day or so. ° °SYMPTOMS TO REPORT IMMEDIATELY: ° °Following lower endoscopy (colonoscopy or flexible sigmoidoscopy): ° Excessive amounts of blood in the stool ° Significant tenderness or worsening of abdominal pains ° Swelling of the abdomen that is new, acute ° Fever of 100°F or higher ° °For urgent or emergent issues, a gastroenterologist can be reached at any hour by calling [phone]. °Do not use MyChart messaging for urgent concerns.  ° ° °DIET:  We do recommend a small meal at first, but then you may proceed to your regular diet.  Drink plenty of fluids but you should avoid alcoholic beverages for 24 hours. ° °ACTIVITY:  You should plan to take it easy for the rest of today and you should NOT DRIVE  or use heavy machinery until tomorrow (because of the sedation medicines used during the test).   ° °FOLLOW UP: °Our staff will call the number listed on your records 48-72 hours following your procedure to check on you and address any questions or concerns that you may have regarding the information given to you following your procedure. If we do not reach you, we will leave a message.  We will attempt to reach you two times.  During this call, we will ask if you have developed any symptoms of COVID 19. If you develop any symptoms (ie: fever, flu-like symptoms, shortness of breath, cough etc.) before then, please call [phone].  If you test positive for Covid 19 in the 2 weeks post procedure, please call and report this information to us.   ° °If any biopsies were taken you will be contacted by phone or by letter within the next 1-3 weeks.  Please call us at [phone] if you have not heard about the biopsies in 3 weeks.  ° ° °SIGNATURES/CONFIDENTIALITY: °You and/or your care partner have signed paperwork which will be entered into your electronic medical record.  These signatures attest to the fact that that the information above on your After Visit Summary has been reviewed and is understood.  Full responsibility of the confidentiality of this discharge information lies with you and/or your care-partner.  °

## 2021-05-15 NOTE — Progress Notes (Signed)
Pt's states no medical or surgical changes since previsit or office visit. VS by CW. 

## 2021-05-15 NOTE — Op Note (Signed)
[facility] Patient Name: Tina [name] Procedure Date: 05/15/2021 9:07 AM MRN: [id number] Endoscopist: [place]. [name] , MD Age: 75 Referring MD:  Date of Birth: 12/13/1945 Gender: Female Account #: [id number] Procedure:                Colonoscopy Indications:              High risk colon cancer surveillance: Personal                            history of adenoma with villous component,                           Large rectal villous adenoma requiring surgical                            resection in Feb 2021 Medicines:                Monitored Anesthesia Care Procedure:                Pre-Anesthesia Assessment:                           - Prior to the procedure, a History and Physical                            was performed, and patient medications and                            allergies were reviewed. The patient's tolerance of                            previous anesthesia was also reviewed. The risks                            and benefits of the procedure and the sedation                            options and risks were discussed with the patient.                            All questions were answered, and informed consent                            was obtained. Prior Anticoagulants: The patient has                            taken no previous anticoagulant or antiplatelet                            agents. ASA Grade Assessment: III - A patient with                            severe systemic disease. After reviewing the risks  and benefits, the patient was deemed in                            satisfactory condition to undergo the procedure.                           After obtaining informed consent, the colonoscope                            was passed under direct vision. Throughout the                            procedure, the patient's blood pressure, pulse, and                            oxygen saturations were monitored continuously. The                             Olympus PCF-H190DL (#[id number]) Colonoscope was                            introduced through the anus and advanced to the the                            cecum, identified by appendiceal orifice and                            ileocecal valve. The colonoscopy was performed                            without difficulty. The patient tolerated the                            procedure well. The quality of the bowel                            preparation was good. The ileocecal valve,                            appendiceal orifice, and rectum were photographed. Scope In: 9:36:05 AM Scope Out: 9:51:03 AM Scope Withdrawal Time: 0 hours 10 minutes 7 seconds  Total Procedure Duration: 0 hours 14 minutes 58 seconds  Findings:                 The digital rectal exam findings include decreased                            sphincter tone.                           Multiple diverticula were found in the left colon                            and right colon.  A 5 mm polyp was found in the ascending colon. The                            polyp was semi-sessile. The polyp was removed with                            a cold snare. Resection and retrieval were complete.                           A tattoo was seen in the mid rectum.                           There was evidence of a prior end-to-end                            colo-rectal anastomosis in the mid rectum. This was                            patent and was characterized by healthy appearing                            mucosa and an intact staple line. No visible polyp                            in area of anastomosis.                           The exam was otherwise without abnormality on                            direct and retroflexion views. Complications:            No immediate complications. Estimated Blood Loss:     Estimated blood loss was minimal. Impression:               - Decreased  sphincter tone found on digital rectal                            exam.                           - Diverticulosis in the left colon and in the right                            colon.                           - One 5 mm polyp in the ascending colon, removed                            with a cold snare. Resected and retrieved.                           - A tattoo was seen in the mid rectum.                           -  Patent end-to-end colo-rectal anastomosis,                            characterized by healthy appearing mucosa and an                            intact staple line.                           - The examination was otherwise normal on direct                            and retroflexion views. Recommendation:           - Patient has a contact number available for                            emergencies. The signs and symptoms of potential                            delayed complications were discussed with the                            patient. Return to normal activities tomorrow.                            Written discharge instructions were provided to the                            patient.                           - Resume previous diet.                           - Continue present medications.                           - Await pathology results.                           - Repeat colonoscopy in 3 years for surveillance. Tina Joaquin L. [name], MD 05/15/2021 9:59:53 AM This report has been signed electronically.

## 2021-05-19 ENCOUNTER — Telehealth: Payer: Self-pay

## 2021-05-19 ENCOUNTER — Telehealth: Payer: Self-pay | Admitting: Family Medicine

## 2021-05-19 ENCOUNTER — Encounter (HOSPITAL_BASED_OUTPATIENT_CLINIC_OR_DEPARTMENT_OTHER): Payer: Self-pay | Admitting: Emergency Medicine

## 2021-05-19 ENCOUNTER — Other Ambulatory Visit: Payer: Self-pay

## 2021-05-19 ENCOUNTER — Emergency Department (HOSPITAL_BASED_OUTPATIENT_CLINIC_OR_DEPARTMENT_OTHER)
Admission: EM | Admit: 2021-05-19 | Discharge: 2021-05-19 | Disposition: A | Discharge status: Home / Self Care | Payer: Medicare Other | Attending: Emergency Medicine | Admitting: Emergency Medicine

## 2021-05-19 ENCOUNTER — Telehealth: Payer: Self-pay | Admitting: *Deleted

## 2021-05-19 ENCOUNTER — Emergency Department (HOSPITAL_BASED_OUTPATIENT_CLINIC_OR_DEPARTMENT_OTHER): Payer: Medicare Other

## 2021-05-19 DIAGNOSIS — M79604 Pain in right leg: Secondary | ICD-10-CM

## 2021-05-19 DIAGNOSIS — Z87891 Personal history of nicotine dependence: Secondary | ICD-10-CM | POA: Diagnosis not present

## 2021-05-19 DIAGNOSIS — M549 Dorsalgia, unspecified: Secondary | ICD-10-CM | POA: Diagnosis not present

## 2021-05-19 DIAGNOSIS — M2578 Osteophyte, vertebrae: Secondary | ICD-10-CM | POA: Diagnosis not present

## 2021-05-19 DIAGNOSIS — I10 Essential (primary) hypertension: Secondary | ICD-10-CM | POA: Insufficient documentation

## 2021-05-19 DIAGNOSIS — Z79899 Other long term (current) drug therapy: Secondary | ICD-10-CM | POA: Insufficient documentation

## 2021-05-19 DIAGNOSIS — Z7982 Long term (current) use of aspirin: Secondary | ICD-10-CM | POA: Diagnosis not present

## 2021-05-19 DIAGNOSIS — M545 Low back pain, unspecified: Secondary | ICD-10-CM | POA: Diagnosis not present

## 2021-05-19 DIAGNOSIS — M25551 Pain in right hip: Secondary | ICD-10-CM | POA: Diagnosis not present

## 2021-05-19 MED ORDER — CYCLOBENZAPRINE HCL 5 MG PO TABS: 10.0000 mg | ORAL_TABLET | Freq: Three times a day (TID) | ORAL | 0 refills | Status: DC | PRN

## 2021-05-19 NOTE — Telephone Encounter (Signed)
Triage called [place] on the back line and they stated that pt needs to be seen within 4 hours to r/o DVT. I have informed her that there was no appointments for today and a urgent care and ER was advisable.

## 2021-05-19 NOTE — Telephone Encounter (Signed)
Nurse Assessment Nurse: Gara Kroner, RN, Honor Junes Date/Time Eilene Ghazi Time): 05/19/2021 1:39:50 PM Confirm and document reason for call. If symptomatic, describe symptoms. ---Caller is having right groin pain and radiates to right thigh. Feels like she pulled something but did not fall or any injury/ trauma to site and having difficulty standing/ walking straight. Started yesterday and progressed. Pain with activity 5/10 but no pain sitting at rest. Does not used and assistive devices for ambulation. Does the patient have any new or worsening symptoms? ---Yes Will a triage be completed? ---Yes Related visit to physician within the last 2 weeks? ---No Does the PT have any chronic conditions? (i.e. diabetes, asthma, this includes High risk factors for pregnancy, etc.) ---Yes List chronic conditions. ---HTN, High cholesterol, CAD, hx MI Is this a behavioral health or substance abuse call? ---No Guidelines Guideline Title Affirmed Question Affirmed Notes Nurse Date/Time Eilene Ghazi Time) Leg Pain [1] Thigh or calf pain AND [2] only 1 side AND [3] present > 1 hour (Exception: chronic unchanged pain) Gara Kroner, RN, Honor Junes 05/19/2021 1:44:47 PM PLEASE NOTE: All timestamps contained within this report are represented as [place] Standard Time. CONFIDENTIALTY NOTICE: This fax transmission is intended only for the addressee. It contains information that is legally privileged, confidential or otherwise protected from use or disclosure. If you are not the intended recipient, you are strictly prohibited from reviewing, disclosing, copying using or disseminating any of this information or taking any action in reliance on or regarding this information. If you have received this fax in error, please notify us immediately by telephone so that we can arrange for its return to [place]. Phone: [phone], Toll-Free: [phone], Fax: [phone] Page: 2 of 2 Call Id: [id number] [place]. Time Eilene Ghazi Time) Disposition  Final User 05/19/2021 1:49:08 PM See HCP within 4 Hours (or PCP triage) Yes Gara Kroner, RN, Marta Antu Disagree/Comply Comply Caller Understands Yes PreDisposition [place] Advice Given Per Guideline SEE HCP (OR PCP TRIAGE) WITHIN 4 HOURS: * IF OFFICE WILL BE OPEN: You need to be seen within the next 3 or 4 hours. Call your doctor (or NP/PA) now or as soon as the office opens. CALL EMS IF: * You develop any chest pain or shortness of breath. CALL BACK IF: * You become worse CARE ADVICE given per Leg Pain (Adult) guideline. Comments User: Delene Ruffini, RN Date/Time Eilene Ghazi Time): 05/19/2021 1:56:13 PM Spoke to appointment line who reported to send caller to [facility] as there are no available appointments until tomorrow. Caller stated ED was closer and she would go there; understanding verbalized. Referrals Warm transfer to [facility] - ED

## 2021-05-19 NOTE — Telephone Encounter (Signed)
First attempt follow up call to pt, lm for pt to call if having any problems or questions, otherwise we will call them back later this morning or early this afternoon.  °

## 2021-05-19 NOTE — Telephone Encounter (Signed)
FYI. Pt currently at ED

## 2021-05-19 NOTE — ED Provider Notes (Signed)
[facility] EMERGENCY DEPARTMENT Provider Note   CSN: [id number] Arrival date & time: 05/19/21  1439     History Chief Complaint  Patient presents with   Leg Pain    Erabella E Shropshire is a 75 y.o. female hx of HTN, here presenting with right leg pain and back pain.  Patient has pain from her back radiates to the right thigh since yesterday.  Patient been taking Tylenol with no relief.  Patient is able to walk and denies any weakness or numbness.  Patient called her doctor and was sent in to rule out DVT. Patient does have history of osteoporosis at baseline.   The history is provided by the patient.      Past Medical History:  Diagnosis Date   Allergy    seasonal   Cataract    removed both eyes   Fatty liver    Goiter    treated   HTN (hypertension)    Hyperlipidemia    Myocardial infarct ([place]) hx of 2004   NSTEMI with stent   Osteoporosis    Patellar fracture    Sleep apnea    no cpap   Wrist fracture    right wrist    Patient Active Problem List   Diagnosis Date Noted   Pain in left shin 01/01/2020   Adenomatous polyp s/p LAR rectosigmoid resection 07/12/2019 05/15/2019   Age-related osteoporosis without current pathological fracture 04/06/2019   Sinus bradycardia 02/24/2017   Post-menopausal bleeding 10/28/2016   Left shoulder pain 01/22/2016   Right wrist injury 03/18/2015   Obesity (BMI 30-39.9) 05/29/2013   Otitis externa 09/30/2010   CONJUNCTIVITIS, BACTERIAL 12/16/2009   SINUSITIS- ACUTE-NOS 08/01/2009   TOBACCO ABUSE, HX OF 07/22/2009   OTITIS EXTERNA, ACUTE, RIGHT 02/08/2009   Goiter 02/10/2007   Hyperlipidemia 02/10/2007   Essential hypertension 02/10/2007   MYOCARDIAL INFARCTION, HX OF 02/10/2007   DIZZINESS 02/10/2007   EDEMA LEG 02/10/2007   FATTY LIVER DISEASE, HX OF 02/10/2007    Past Surgical History:  Procedure Laterality Date   CARDIAC CATHETERIZATION  2004   stent   CATARACT EXTRACTION, BILATERAL  05/2020   COLONOSCOPY      POLYPECTOMY     PROCTOSCOPY N/A 07/12/2019   Procedure: RIGID PROCTOSCOPY;  Surgeon: Michael Boston, MD;  Location: [facility];  Service: General;  Laterality: N/A;   sleep study  2012     OB History     Gravida  2   Para  2   Term  2   Preterm      AB      Living  2      SAB      IAB      Ectopic      Multiple      Live Births              Family History  Problem Relation Age of Onset   Hypertension Mother    Heart disease Mother 48       MI--- died from #3 at 55   Ovarian cancer Mother    Hypertension Father    Cancer Father 24       ovarian   Heart disease Father    Hypertension Sister    Cancer Sister 21       brain tumor   Coronary artery disease Other    Lung cancer Other    Ovarian cancer Other    Diabetes Maternal Grandmother    Hypertension Brother  Arthritis Brother    Colon cancer Neg Hx    Colon polyps Neg Hx    Esophageal cancer Neg Hx    Stomach cancer Neg Hx    Rectal cancer Neg Hx     Social History   Tobacco Use   Smoking status: Former    Packs/day: 1.00    Years: 30.00    Pack years: 30.00    Types: Cigarettes    Quit date: 12/13/2002    Years since quitting: 18.4   Smokeless tobacco: Former    Quit date: 06/01/2002  Vaping Use   Vaping Use: Never used  Substance Use Topics   Alcohol use: Yes    Comment: 1-2 glasses of wine with dinner   Drug use: No    Home Medications Prior to Admission medications   Medication Sig Start Date End Date Taking? Authorizing Provider  alendronate (FOSAMAX) 70 MG tablet TAKE ONE TABLET BY MOUTH ONCE WEEKLY ON EMPTY STOMACH WITH FULL GLASS OF WATER 10/25/20   Carollee Herter, Alferd Apa, DO  aspirin 81 MG tablet Take 81 mg by mouth daily.    [provider]  Calcium Carbonate-Vit D-Min (CALCIUM 1200 PO) Take 1,200 mg by mouth daily. 1 tab po qd    [provider]  Multiple Vitamin (MULTIVITAMIN) capsule Take 1 capsule by mouth daily.    [provider]  Omega-3  Fatty Acids (FISH OIL) 1000 MG CAPS Take 2,000 mg by mouth daily.    [provider]  ramipril (ALTACE) 5 MG capsule TAKE 1 CAPSULE(5 MG) BY MOUTH DAILY 11/21/20   Carollee Herter, Kendrick Fries R, DO  rosuvastatin (CRESTOR) 20 MG tablet TAKE 1/2 TABLET(10 MG) BY MOUTH DAILY 04/29/21   Ann Held, DO    Allergies    Patient has no known allergies.  Review of Systems   Review of Systems  Musculoskeletal:  Positive for back pain.       Right thigh pain   All other systems reviewed and are negative.  Physical Exam Updated Vital Signs BP (!) 154/79 (BP Location: Right Arm)    Pulse 77    Temp 98.2 F (36.8 C) (Oral)    Resp 16    Ht 5\' 5"  (1.651 m)    Wt 72.6 kg    SpO2 100%    BMI 26.63 kg/m   Physical Exam Vitals and nursing note reviewed.  Constitutional:      Appearance: Normal appearance.  HENT:     Head: Normocephalic.     Nose: Nose normal.     Mouth/Throat:     Mouth: Mucous membranes are moist.  Eyes:     Extraocular Movements: Extraocular movements intact.     Pupils: Pupils are equal, round, and reactive to light.  Cardiovascular:     Rate and Rhythm: Normal rate and regular rhythm.     Pulses: Normal pulses.     Heart sounds: Normal heart sounds.  Pulmonary:     Effort: Pulmonary effort is normal.     Breath sounds: Normal breath sounds.  Abdominal:     General: Abdomen is flat.     Palpations: Abdomen is soft.  Musculoskeletal:     Cervical back: Normal range of motion and neck supple.     Comments: Mild tenderness of right paralumbar area.  Patient has mild tenderness on the lower right SI joint.  Normal range of motion of the right hip.  Patient has no obvious bony tenderness of the right femur  or knee.  Patient has no saddle anesthesia  Skin:    General: Skin is warm.     Capillary Refill: Capillary refill takes less than 2 seconds.  Neurological:     General: No focal deficit present.     Mental Status: She is alert and oriented to person, place,  and time.     Comments: No saddle anesthesia, patient has normal sensation bilateral legs.  Normal reflexes bilaterally  Psychiatric:        Mood and Affect: Mood normal.        Behavior: Behavior normal.    ED Results / Procedures / Treatments   Labs (all labs ordered are listed, but only abnormal results are displayed) Labs Reviewed - No data to display  EKG None  Radiology DG Lumbar Spine Complete  Result Date: 05/19/2021 CLINICAL DATA:  Right hip and back pain. EXAM: LUMBAR SPINE - COMPLETE 4+ VIEW COMPARISON:  Lumbar spine x-ray 03/27/2020. FINDINGS: There is mild levoconvex curvature of the lumbar spine. There is no acute fracture or dislocation. Alignment is anatomic. Disc spaces are maintained. There are mild degenerative endplate osteophytes throughout the lumbar spine similar to the prior study. Surgical staples are seen in the pelvis. IMPRESSION: 1. No acute fracture or malalignment. 2. Stable mild degenerative changes. Electronically Signed   By: Ronney Asters M.D.   On: 05/19/2021 17:39   US Venous Img Lower Unilateral Right  Result Date: 05/19/2021 CLINICAL DATA:  Right leg pain. EXAM: RIGHT LOWER EXTREMITY VENOUS DOPPLER ULTRASOUND TECHNIQUE: Gray-scale sonography with compression, as well as color and duplex ultrasound, were performed to evaluate the deep venous system(s) from the level of the common femoral vein through the popliteal and proximal calf veins. COMPARISON:  None. FINDINGS: VENOUS Normal compressibility of the common femoral, superficial femoral, and popliteal veins, as well as the visualized calf veins. Visualized portions of profunda femoral vein and great saphenous vein unremarkable. No filling defects to suggest DVT on grayscale or color Doppler imaging. Doppler waveforms show normal direction of venous flow, normal respiratory plasticity and response to augmentation. Limited views of the contralateral common femoral vein are unremarkable. OTHER None.  Limitations: none IMPRESSION: Negative. Electronically Signed   By: Ronney Asters M.D.   On: 05/19/2021 18:30   DG Hip Unilat W or Wo Pelvis 2-3 Views Right  Result Date: 05/19/2021 CLINICAL DATA:  Right hip and back pain, initial encounter EXAM: DG HIP (WITH OR WITHOUT PELVIS) 3V RIGHT COMPARISON:  None. FINDINGS: Pelvic ring is intact. No acute fracture or dislocation is noted. No soft tissue abnormality is seen. IMPRESSION: No acute abnormality noted. Electronically Signed   By: Inez Catalina M.D.   On: 05/19/2021 17:42    Procedures Procedures   Medications Ordered in ED Medications - No data to display  ED Course  I have reviewed the triage vital signs and the nursing notes.  Pertinent labs & imaging results that were available during my care of the patient were reviewed by me and considered in my medical decision making (see chart for details).    MDM Rules/Calculators/A&P                         Ardyce EKTA DANCER is a 75 y.o. female here presenting with right thigh pain and leg pain.  I think likely radiculopathy.  Patient was concerned for possible DVT.  Will get DVT study and also x-ray of the lumbar spine.  Patient does not need  MRI right now  6:38 PM X-rays and DVT study negative.  Patient is ambulatory.  Will discharge patient home with some Flexeril as needed.      Final Clinical Impression(s) / ED Diagnoses Final diagnoses:  None    Rx / DC Orders ED Discharge Orders     None        Drenda Freeze, MD 05/19/21 [phone]

## 2021-05-19 NOTE — Discharge Instructions (Signed)
You likely have muscle spasms in your leg.  There is no blood clots or fractures right now  Continue Tylenol for pain.  Take Flexeril as needed for muscle spasms  See your doctor for follow up  Return to ER if you have worse leg pain, leg swelling, calf pain, numbness or weakness

## 2021-05-19 NOTE — Telephone Encounter (Signed)
°  Follow up Call-  Call back number 05/15/2021 04/10/2019  Post procedure Call Back phone  # [phone]  Permission to leave phone message Yes Yes  Some recent data might be hidden     Patient questions:  Do you have a fever, pain , or abdominal swelling? No. Pain Score  0 *  Have you tolerated food without any problems? Yes.    Have you been able to return to your normal activities? Yes.    Do you have any questions about your discharge instructions: Diet   No. Medications  No. Follow up visit  No.  Do you have questions or concerns about your Care? No.  Actions: * If pain score is 4 or above: No action needed, pain <4.

## 2021-05-19 NOTE — Telephone Encounter (Signed)
Pt called and stated she has been having pain in her right growing area. She is now having numbness and weakness in her legs. Her leg gave out on her this morning as well.Transferred to triage for further advice.

## 2021-05-19 NOTE — ED Triage Notes (Signed)
Rt groin pain into rt thigh since yesterday and then it worse and it has  radiated to her back, took tylenol, did not help  denies injury  able to walk without diff and pain but feels like it is weak

## 2021-05-21 ENCOUNTER — Other Ambulatory Visit: Payer: Self-pay | Admitting: Family Medicine

## 2021-05-21 ENCOUNTER — Encounter: Payer: Self-pay | Admitting: Family Medicine

## 2021-05-21 MED ORDER — CYCLOBENZAPRINE HCL 5 MG PO TABS: 10.0000 mg | ORAL_TABLET | Freq: Three times a day (TID) | ORAL | 0 refills | Status: DC | PRN

## 2021-05-28 ENCOUNTER — Encounter: Payer: Self-pay | Admitting: Gastroenterology

## 2021-06-02 ENCOUNTER — Other Ambulatory Visit: Payer: Self-pay | Admitting: Family Medicine

## 2021-06-05 ENCOUNTER — Encounter: Payer: Self-pay | Admitting: Family Medicine

## 2021-06-05 ENCOUNTER — Ambulatory Visit (INDEPENDENT_AMBULATORY_CARE_PROVIDER_SITE_OTHER): Payer: Medicare Other | Admitting: Family Medicine

## 2021-06-05 ENCOUNTER — Other Ambulatory Visit: Payer: Self-pay | Admitting: Family Medicine

## 2021-06-05 VITALS — BP 140/90 | HR 88 | Temp 97.8°F | Resp 18 | Ht 65.0 in | Wt 160.0 lb

## 2021-06-05 DIAGNOSIS — R29898 Other symptoms and signs involving the musculoskeletal system: Secondary | ICD-10-CM | POA: Diagnosis not present

## 2021-06-05 DIAGNOSIS — I10 Essential (primary) hypertension: Secondary | ICD-10-CM

## 2021-06-05 DIAGNOSIS — M545 Low back pain, unspecified: Secondary | ICD-10-CM | POA: Insufficient documentation

## 2021-06-05 DIAGNOSIS — E785 Hyperlipidemia, unspecified: Secondary | ICD-10-CM

## 2021-06-05 LAB — COMPREHENSIVE METABOLIC PANEL
ALT: 14 U/L (ref 0–35)
AST: 20 U/L (ref 0–37)
Albumin: 4.6 g/dL (ref 3.5–5.2)
Alkaline Phosphatase: 47 U/L (ref 39–117)
BUN: 19 mg/dL (ref 6–23)
CO2: 31 mEq/L (ref 19–32)
Calcium: 9.8 mg/dL (ref 8.4–10.5)
Chloride: 97 mEq/L (ref 96–112)
Creatinine, Ser: 0.59 mg/dL (ref 0.40–1.20)
GFR: 88.88 mL/min (ref >60.00)
Glucose, Bld: 91 mg/dL (ref 70–99)
Potassium: 4.2 mEq/L (ref 3.5–5.1)
Sodium: 136 mEq/L (ref 135–145)
Total Bilirubin: 0.9 mg/dL (ref 0.2–1.2)
Total Protein: 7.1 g/dL (ref 6.0–8.3)

## 2021-06-05 LAB — LIPID PANEL
Cholesterol: 190 mg/dL (ref 0–200)
HDL: 84.1 mg/dL
LDL Cholesterol: 93 mg/dL (ref 0–99)
NonHDL: 105.7
Total CHOL/HDL Ratio: 2
Triglycerides: 65 mg/dL (ref 0.0–149.0)
VLDL: 13 mg/dL (ref 0.0–40.0)

## 2021-06-05 LAB — CBC WITH DIFFERENTIAL/PLATELET
Basophils Absolute: 0 10*3/uL (ref 0.0–0.1)
Basophils Relative: 0.5 % (ref 0.0–3.0)
Eosinophils Absolute: 0.1 10*3/uL (ref 0.0–0.7)
Eosinophils Relative: 2.5 % (ref 0.0–5.0)
HCT: 38.9 % (ref 36.0–46.0)
Hemoglobin: 13.2 g/dL (ref 12.0–15.0)
Lymphocytes Relative: 33.4 % (ref 12.0–46.0)
Lymphs Abs: 2 10*3/uL (ref 0.7–4.0)
MCHC: 33.8 g/dL (ref 30.0–36.0)
MCV: 93.2 fl (ref 78.0–100.0)
Monocytes Absolute: 0.5 10*3/uL (ref 0.1–1.0)
Monocytes Relative: 7.7 % (ref 3.0–12.0)
Neutro Abs: 3.4 10*3/uL (ref 1.4–7.7)
Neutrophils Relative %: 55.9 % (ref 43.0–77.0)
Platelets: 278 10*3/uL (ref 150.0–400.0)
RBC: 4.18 Mil/uL (ref 3.87–5.11)
RDW: 12.7 % (ref 11.5–15.5)
WBC: 6.1 10*3/uL (ref 4.0–10.5)

## 2021-06-05 NOTE — Progress Notes (Signed)
Established Patient Office Visit  Subjective:  Patient ID: ARLOA PRAK, female    DOB: 09-16-1945  Age: 76 y.o. MRN: [id number]  CC:  Chief Complaint  Patient presents with   Leg Pain    Right leg, Pt states pain going on since December. Pt states pain going from right hip to right knee. Pt states no pain right now or with walking. Pt states right leg has given out on her a couple times and states having bruising on her foot.    HPI Naela E Pester presents for R leg pain -- this started in dec.  She felt still dec 18 and pain started later that night and she woke up on the 19 th with sever pain and went to ER.  Xrays and US done --- neg except deg changes  She has no pain today but her leg is giving out on her and she fell Monday due to her r leg giving way   She injured her R foot with this fall and its black and blue.     Past Medical History:  Diagnosis Date   Allergy    seasonal   Cataract    removed both eyes   Fatty liver    Goiter    treated   HTN (hypertension)    Hyperlipidemia    Myocardial infarct ([place]) hx of 2004   NSTEMI with stent   Osteoporosis    Patellar fracture    Sleep apnea    no cpap   Wrist fracture    right wrist    Past Surgical History:  Procedure Laterality Date   CARDIAC CATHETERIZATION  2004   stent   CATARACT EXTRACTION, BILATERAL  05/2020   COLONOSCOPY     POLYPECTOMY     PROCTOSCOPY N/A 07/12/2019   Procedure: RIGID PROCTOSCOPY;  Surgeon: Michael Boston, MD;  Location: [facility];  Service: General;  Laterality: N/A;   sleep study  2012    Family History  Problem Relation Age of Onset   Hypertension Mother    Heart disease Mother 56       MI--- died from #3 at 52   Ovarian cancer Mother    Hypertension Father    Cancer Father 73       ovarian   Heart disease Father    Hypertension Sister    Cancer Sister 24       brain tumor   Coronary artery disease Other    Lung cancer Other    Ovarian cancer Other    Diabetes Maternal  Grandmother    Hypertension Brother    Arthritis Brother    Colon cancer Neg Hx    Colon polyps Neg Hx    Esophageal cancer Neg Hx    Stomach cancer Neg Hx    Rectal cancer Neg Hx     Social History   Socioeconomic History   Marital status: Divorced    Spouse name: Not on file   Number of children: Not on file   Years of education: Not on file   Highest education level: Not on file  Occupational History   Occupation: nurse    Employer: OTHER    Comment: [employer]   Tobacco Use   Smoking status: Former    Packs/day: 1.00    Years: 30.00    Pack years: 30.00    Types: Cigarettes    Quit date: 12/13/2002    Years since quitting: 18.4   Smokeless tobacco: Former  Quit date: 06/01/2002  Vaping Use   Vaping Use: Never used  Substance and Sexual Activity   Alcohol use: Yes    Comment: 1-2 glasses of wine with dinner   Drug use: No   Sexual activity: Not Currently    Partners: Male  Other Topics Concern   Not on file  Social History Narrative   Occupation:[employer]   Divorced   Former Smoker quit 2004   Drug use-no   Regular exercise-treadmill 3x a week   Originially from [place]   Social Determinants of [employer] Strain: Low Risk    Difficulty of Paying Living Expenses: Not hard at all  Food Insecurity: No Food Insecurity   Worried About Charity fundraiser in the Last Year: Never true   Arboriculturist in the Last Year: Never true  Transportation Needs: No Transportation Needs   Lack of Transportation (Medical): No   Lack of Transportation (Non-Medical): No  Physical Activity: Sufficiently Active   Days of Exercise per Week: 7 days   Minutes of Exercise per Session: 30 min  Stress: No Stress Concern Present   Feeling of Stress : Not at all  Social Connections: Socially Isolated   Frequency of Communication with Friends and Family: Once a week   Frequency of Social Gatherings with Friends and Family: Once a week   Attends  Religious Services: Never   Marine scientist or Organizations: No   Attends Music therapist: Never   Marital Status: Divorced  Human resources officer Violence: Not At Risk   Fear of Current or Ex-Partner: No   Emotionally Abused: No   Physically Abused: No   Sexually Abused: No    Outpatient Medications Prior to Visit  Medication Sig Dispense Refill   alendronate (FOSAMAX) 70 MG tablet TAKE 1 TABLET BY MOUTH 1 TIME WEEKLY ON AN EMPTY STOMACH AND WITH FULL GLASS OF WATER 12 tablet 1   aspirin 81 MG tablet Take 81 mg by mouth daily.     Calcium Carbonate-Vit D-Min (CALCIUM 1200 PO) Take 1,200 mg by mouth daily. 1 tab po qd     Multiple Vitamin (MULTIVITAMIN) capsule Take 1 capsule by mouth daily.     Omega-3 Fatty Acids (FISH OIL) 1000 MG CAPS Take 2,000 mg by mouth daily.     ramipril (ALTACE) 5 MG capsule TAKE 1 CAPSULE(5 MG) BY MOUTH DAILY 90 capsule 1   rosuvastatin (CRESTOR) 20 MG tablet TAKE 1/2 TABLET(10 MG) BY MOUTH DAILY 45 tablet 1   cyclobenzaprine (FLEXERIL) 5 MG tablet Take 2 tablets (10 mg total) by mouth 3 (three) times daily as needed for muscle spasms. (Patient not taking: Reported on 06/05/2021) 45 tablet 0   No facility-administered medications prior to visit.    No Known Allergies  ROS Review of Systems  Constitutional:  Negative for activity change, appetite change, fatigue and unexpected weight change.  Respiratory:  Negative for cough and shortness of breath.   Cardiovascular:  Negative for chest pain and palpitations.  Psychiatric/Behavioral:  Negative for behavioral problems and dysphoric mood. The patient is not nervous/anxious.      Objective:    Physical Exam Vitals and nursing note reviewed.  Constitutional:      Appearance: She is well-developed.  HENT:     Head: Normocephalic and atraumatic.  Eyes:     Conjunctiva/sclera: Conjunctivae normal.  Neck:     Thyroid: No thyromegaly.     Vascular: No carotid bruit  or JVD.   Cardiovascular:     Rate and Rhythm: Normal rate and regular rhythm.     Heart sounds: Normal heart sounds. No murmur heard. Pulmonary:     Effort: Pulmonary effort is normal. No respiratory distress.     Breath sounds: Normal breath sounds. No wheezing or rales.  Chest:     Chest wall: No tenderness.  Musculoskeletal:     Cervical back: Normal range of motion and neck supple.  Neurological:     Mental Status: She is alert and oriented to person, place, and time.     Sensory: No sensory deficit.     Motor: Weakness present.     Coordination: Coordination normal.     Gait: Gait abnormal.     Deep Tendon Reflexes: Reflexes normal.     Comments: R leg--  weakness low R leg ext 4/5 and hip flexion 2/5   Psychiatric:        Mood and Affect: Mood normal.    BP 140/90 (BP Location: Right Arm, Patient Position: Sitting, Cuff Size: Normal)    Pulse 88    Temp 97.8 F (36.6 C) (Oral)    Resp 18    Ht 5\' 5"  (1.651 m)    Wt 160 lb (72.6 kg)    SpO2 100%    BMI 26.63 kg/m  Wt Readings from Last 3 Encounters:  06/05/21 160 lb (72.6 kg)  05/19/21 160 lb (72.6 kg)  05/01/21 161 lb (73 kg)     Health Maintenance Due  Topic Date Due   Zoster Vaccines- Shingrix (1 of 2) Never done   TETANUS/TDAP  10/13/2020    There are no preventive care reminders to display for this patient.  Lab Results  Component Value Date   TSH 1.26 04/06/2019   Lab Results  Component Value Date   WBC 7.2 07/09/2020   HGB 13.5 07/09/2020   HCT 40.8 07/09/2020   MCV 94 07/09/2020   PLT 239 07/09/2020   Lab Results  Component Value Date   NA 141 11/04/2020   K 4.3 11/04/2020   CO2 30 11/04/2020   GLUCOSE 92 11/04/2020   BUN 14 11/04/2020   CREATININE 0.61 11/04/2020   BILITOT 0.6 11/04/2020   ALKPHOS 44 11/04/2020   AST 19 11/04/2020   ALT 13 11/04/2020   PROT 7.0 11/04/2020   ALBUMIN 4.6 11/04/2020   CALCIUM 10.5 11/04/2020   ANIONGAP 7 07/13/2019   GFR 88.53 11/04/2020   Lab Results   Component Value Date   CHOL 205 (H) 11/04/2020   Lab Results  Component Value Date   HDL 87.60 11/04/2020   Lab Results  Component Value Date   LDLCALC 102 (H) 11/04/2020   Lab Results  Component Value Date   TRIG 79.0 11/04/2020   Lab Results  Component Value Date   CHOLHDL 2 11/04/2020   Lab Results  Component Value Date   HGBA1C 5.4 07/10/2019      Assessment & Plan:   Problem List Items Addressed This Visit       Unprioritized   Hyperlipidemia   Relevant Orders   CBC with Differential/Platelet   Comprehensive metabolic panel   Lipid panel   Low back pain with radiation - Primary    Mri low back Pt does not need any pain med and muscle relaxer did not work      Relevant Orders   MR Lumbar Spine Wo Contrast   Primary hypertension    Well controlled, no  changes to meds. Encouraged heart healthy diet such as the DASH diet and exercise as tolerated.       Relevant Orders   CBC with Differential/Platelet   Comprehensive metabolic panel   Lipid panel   Right leg weakness    Suspect lumbar radiculopathy       Relevant Orders   MR Lumbar Spine Wo Contrast    No orders of the defined types were placed in this encounter.   Follow-up: Return in about 2 weeks (around 06/19/2021), or if symptoms worsen or fail to improve, for annual exam, fasting, hypertension.    Ann Held, DO

## 2021-06-05 NOTE — Assessment & Plan Note (Signed)
Suspect lumbar radiculopathy

## 2021-06-05 NOTE — Assessment & Plan Note (Signed)
Well controlled, no changes to meds. Encouraged heart healthy diet such as the DASH diet and exercise as tolerated.  °

## 2021-06-05 NOTE — Assessment & Plan Note (Signed)
Mri low back Pt does not need any pain med and muscle relaxer did not work

## 2021-06-05 NOTE — Patient Instructions (Signed)
Lumbosacral Radiculopathy °Lumbosacral radiculopathy is a condition that involves the spinal nerves and nerve roots in the low back and bottom of the spine. The condition develops when these nerves and nerve roots move out of place or become inflamed and cause symptoms. °What are the causes? °This condition may be caused by: °Pressure from a disk that bulges out of place (herniated disk). A disk is a plate of soft cartilage that separates bones in the spine. °Disk changes that occur with age (disk degeneration). °A narrowing of the bones of the lower back (spinal stenosis). °A tumor. °An infection. °An injury that places sudden pressure on the disks that cushion the bones of your lower spine. °What increases the risk? °You are more likely to develop this condition if: °You are a female who is 30-50 years old. °You are a female who is 50-60 years old. °You use improper technique when lifting things. °You are overweight or live a sedentary lifestyle. °You smoke. °Your work requires frequent lifting. °You do repetitive activities that strain the spine. °What are the signs or symptoms? °Symptoms of this condition include: °Pain that goes down from your back into your legs (sciatica), usually on one side of the body. This is the most common symptom. The pain may be worse when you sit, cough, or sneeze. °Tingling and numbness in your legs. °Muscle weakness in your legs. °Loss of bladder control or bowel control. °How is this diagnosed? °This condition may be diagnosed based on: °Your symptoms and medical history. °A physical exam. °If the pain lasts, you may have tests, such as: °MRI. °X-ray. °CT scan. °A type of CT scan used to examine the spinal canal after injecting a dye into your spine (myelogram). °A test to measure how electrical impulses move through a nerve (nerve conduction study). °A test to measure the electrical activity in muscles (electromyogram). °How is this treated? °In many cases, treatment is not needed  for this condition. With rest, the condition usually gets better over time. If treatment is needed, it may include: °Working with a physical therapist to improve strength and flexibility. °Taking pain medicine. °Applying heat or ice or both to the affected areas. °Having chiropractic spinal manipulation. °Using transcutaneous electrical nerve stimulation (TENS) therapy. °Getting a steroid injection in the spine. °Having surgery. This may be needed if other treatments do not help. Different types of surgery may be done depending on the cause of this condition. °Follow these instructions at home: °Activity °Avoid bending and any other activities that make the problem worse. °Maintain a proper position when standing or sitting. °When standing, keep your upper back and neck straight with your shoulders pulled back. Avoid slouching. °When sitting, keep your back straight and relax your shoulders. Do not round your shoulders or pull them backward. °Do not sit or stand in one place for long periods of time. °Take brief periods of rest throughout the day. This will reduce your pain. It is usually better to rest by lying down or standing, not sitting. °Mix in mild activity or stretching between long periods of rest. This will help to prevent stiffness and pain. °Get regular exercise. Ask your health care provider what activities are safe for you. If you were shown how to do any exercises or stretches, do them as told by your health care provider. °You may have to avoid lifting. Ask your health care provider how much you can safely lift. °Always use proper lifting technique, which includes: °Bending your knees. °Keeping the load close   to your body. °Avoiding twisting. °Managing pain °If directed, put ice on the affected area. To do this: °Put ice in a plastic bag. °Place a towel between your skin and the bag. °Leave the ice on for 20 minutes, 2-3 times a day. °Remove the ice if your skin turns bright red. This is very  important. If you cannot feel pain, heat, or cold, you have a greater risk of damage to the area. °If directed, apply heat to the affected area as often as told by your health care provider. Use the heat source that your health care provider recommends, such as a moist heat pack or a heating pad. °Place a towel between your skin and the heat source. °Leave the heat on for 20-30 minutes. °Remove the heat if your skin turns bright red. This is especially important if you are unable to feel pain, heat, or cold. You have a greater risk of getting burned. °Take over-the-counter and prescription medicines only as told by your health care provider. °General instructions °Sleep on a firm mattress in a comfortable position. Try lying on your side with your knees slightly bent. If you lie on your back, put a pillow under your knees. °Ask your health care provider if the medicine prescribed to you requires you to avoid driving or using machinery. °If your health care provider prescribed a diet or exercise program, follow it as told. °Keep all follow-up visits. This is important. °Contact a health care provider if: °Your pain does not get better over time, even when taking pain medicines. °Get help right away if: °You develop severe pain. °Your pain suddenly gets worse. °You develop increasing weakness in your legs. °You lose the ability to control your bladder or bowel. °You have difficulty walking or balancing. °You have a fever. °Summary °Lumbosacral radiculopathy is a condition that occurs when the spinal nerves and nerve roots in the lower part of the spine move out of place or become inflamed and cause symptoms. °Symptoms include pain, numbness, and tingling that go down from your back into your legs (sciatica), muscle weakness, and loss of bladder control or bowel control. °If directed, apply ice or heat or both to the affected area as told by your health care provider. °Follow instructions about activity, rest, and  proper lifting technique. °This information is not intended to replace advice given to you by your health care provider. Make sure you discuss any questions you have with your health care provider. °Document Revised: 11/21/2020 Document Reviewed: 11/21/2020 °Elsevier Patient Education © 2022 [employer] ° °

## 2021-06-07 ENCOUNTER — Other Ambulatory Visit: Payer: Self-pay

## 2021-06-07 ENCOUNTER — Ambulatory Visit
Admission: RE | Admit: 2021-06-07 | Discharge: 2021-06-07 | Disposition: A | Discharge status: Home / Self Care | Payer: Medicare Other | Source: Ambulatory Visit | Attending: Family Medicine | Admitting: Family Medicine

## 2021-06-07 DIAGNOSIS — M48061 Spinal stenosis, lumbar region without neurogenic claudication: Secondary | ICD-10-CM | POA: Diagnosis not present

## 2021-06-07 DIAGNOSIS — M545 Low back pain, unspecified: Secondary | ICD-10-CM | POA: Diagnosis not present

## 2021-06-07 DIAGNOSIS — M4126 Other idiopathic scoliosis, lumbar region: Secondary | ICD-10-CM | POA: Diagnosis not present

## 2021-06-07 DIAGNOSIS — R29898 Other symptoms and signs involving the musculoskeletal system: Secondary | ICD-10-CM

## 2021-06-09 ENCOUNTER — Other Ambulatory Visit: Payer: Self-pay | Admitting: Family Medicine

## 2021-06-09 DIAGNOSIS — M5126 Other intervertebral disc displacement, lumbar region: Secondary | ICD-10-CM

## 2021-06-19 ENCOUNTER — Encounter: Payer: Self-pay | Admitting: Family Medicine

## 2021-06-19 ENCOUNTER — Ambulatory Visit (INDEPENDENT_AMBULATORY_CARE_PROVIDER_SITE_OTHER): Payer: Medicare Other | Admitting: Family Medicine

## 2021-06-19 VITALS — BP 122/78 | HR 58 | Temp 98.4°F | Resp 18 | Ht 65.0 in | Wt 160.2 lb

## 2021-06-19 DIAGNOSIS — M48061 Spinal stenosis, lumbar region without neurogenic claudication: Secondary | ICD-10-CM | POA: Diagnosis not present

## 2021-06-19 DIAGNOSIS — I10 Essential (primary) hypertension: Secondary | ICD-10-CM | POA: Diagnosis not present

## 2021-06-19 NOTE — Assessment & Plan Note (Signed)
Ortho pending  Pt referral placed

## 2021-06-19 NOTE — Patient Instructions (Signed)

## 2021-06-19 NOTE — Assessment & Plan Note (Signed)
Well controlled, no changes to meds. Encouraged heart healthy diet such as the DASH diet and exercise as tolerated.  °

## 2021-06-19 NOTE — Progress Notes (Signed)
Established Patient Office Visit  Subjective:  Patient ID: Tina [name], female    DOB: 1945-06-06  Age: 76 y.o. MRN: [id number]  CC:  Chief Complaint  Patient presents with   Hypertension    Pt states checking blood pressures at home and states this morning 114/70.    Follow-up    HPI Tina [name] presents for f/u bp and back pain.   The back pain is much better but she has weakness in her legs still. No other complaints   Past Medical History:  Diagnosis Date   Allergy    seasonal   Cataract    removed both eyes   Fatty liver    Goiter    treated   HTN (hypertension)    Hyperlipidemia    Myocardial infarct ([place]) hx of 2004   NSTEMI with stent   Osteoporosis    Patellar fracture    Sleep apnea    no cpap   Wrist fracture    right wrist    Past Surgical History:  Procedure Laterality Date   CARDIAC CATHETERIZATION  2004   stent   CATARACT EXTRACTION, BILATERAL  05/2020   COLONOSCOPY     POLYPECTOMY     PROCTOSCOPY N/A 07/12/2019   Procedure: RIGID PROCTOSCOPY;  Surgeon: Michael Boston, MD;  Location: [facility];  Service: General;  Laterality: N/A;   sleep study  2012    Family History  Problem Relation Age of Onset   Hypertension Mother    Heart disease Mother 48       MI--- died from #3 at 37   Ovarian cancer Mother    Hypertension Father    Cancer Father 69       ovarian   Heart disease Father    Hypertension Sister    Cancer Sister 36       brain tumor   Coronary artery disease Other    Lung cancer Other    Ovarian cancer Other    Diabetes Maternal Grandmother    Hypertension Brother    Arthritis Brother    Colon cancer Neg Hx    Colon polyps Neg Hx    Esophageal cancer Neg Hx    Stomach cancer Neg Hx    Rectal cancer Neg Hx     Social History   Socioeconomic History   Marital status: Divorced    Spouse name: Not on file   Number of children: Not on file   Years of education: Not on file   Highest education level: Not on file   Occupational History   Occupation: Optician, dispensing: OTHER    Comment: [employer]   Tobacco Use   Smoking status: Former    Packs/day: 1.00    Years: 30.00    Pack years: 30.00    Types: Cigarettes    Quit date: 12/13/2002    Years since quitting: 18.5   Smokeless tobacco: Former    Quit date: 06/01/2002  Vaping Use   Vaping Use: Never used  Substance and Sexual Activity   Alcohol use: Yes    Comment: 1-2 glasses of wine with dinner   Drug use: No   Sexual activity: Not Currently    Partners: Male  Other Topics Concern   Not on file  Social History Narrative   Occupation:[employer]   Divorced   Former Smoker quit 2004   Drug use-no   Regular exercise-treadmill 3x a week   [place] from [place]  Social Determinants of Health   Financial Resource Strain: Low Risk    Difficulty of Paying Living Expenses: Not hard at all  Food Insecurity: No Food Insecurity   Worried About Charity fundraiser in the Last Year: Never true   [place] in the Last Year: Never true  Transportation Needs: No Transportation Needs   Lack of Transportation (Medical): No   Lack of Transportation (Non-Medical): No  Physical Activity: Sufficiently Active   Days of Exercise per Week: 7 days   Minutes of Exercise per Session: 30 min  Stress: No Stress Concern Present   Feeling of Stress : Not at all  Social Connections: Socially Isolated   Frequency of Communication with Friends and Family: Once a week   Frequency of Social Gatherings with Friends and Family: Once a week   Attends Religious Services: Never   Marine scientist or Organizations: No   Attends Music therapist: Never   Marital Status: Divorced  Human resources officer Violence: Not At Risk   Fear of Current or Ex-Partner: No   Emotionally Abused: No   Physically Abused: No   Sexually Abused: No    Outpatient Medications Prior to Visit  Medication Sig Dispense Refill   alendronate (FOSAMAX) 70  MG tablet TAKE 1 TABLET BY MOUTH 1 TIME WEEKLY ON AN EMPTY STOMACH AND WITH FULL GLASS OF WATER 12 tablet 1   aspirin 81 MG tablet Take 81 mg by mouth daily.     Calcium Carbonate-Vit D-Min (CALCIUM 1200 PO) Take 1,200 mg by mouth daily. 1 tab po qd     Multiple Vitamin (MULTIVITAMIN) capsule Take 1 capsule by mouth daily.     Omega-3 Fatty Acids (FISH OIL) 1000 MG CAPS Take 2,000 mg by mouth daily.     ramipril (ALTACE) 5 MG capsule TAKE 1 CAPSULE(5 MG) BY MOUTH DAILY 90 capsule 1   rosuvastatin (CRESTOR) 20 MG tablet TAKE 1/2 TABLET(10 MG) BY MOUTH DAILY 45 tablet 1   No facility-administered medications prior to visit.    No Known Allergies  ROS Review of Systems  Constitutional:  Negative for activity change, appetite change, fatigue and unexpected weight change.  Respiratory:  Negative for cough and shortness of breath.   Cardiovascular:  Negative for chest pain and palpitations.  Musculoskeletal:        Weakness  Psychiatric/Behavioral:  Negative for behavioral problems and dysphoric mood. The patient is not nervous/anxious.      Objective:    Physical Exam Vitals and nursing note reviewed.  Constitutional:      Appearance: She is well-developed.  HENT:     Head: Normocephalic and atraumatic.  Eyes:     Conjunctiva/sclera: Conjunctivae normal.  Neck:     Thyroid: No thyromegaly.     Vascular: No carotid bruit or JVD.  Cardiovascular:     Rate and Rhythm: Normal rate and regular rhythm.     Heart sounds: Normal heart sounds. No murmur heard. Pulmonary:     Effort: Pulmonary effort is normal. No respiratory distress.     Breath sounds: Normal breath sounds. No wheezing or rales.  Chest:     Chest wall: No tenderness.  Musculoskeletal:     Cervical back: Normal range of motion and neck supple.     Comments: Weakness in R leg---  no change from previous exam  Neurological:     Mental Status: She is alert and oriented to person, place, and time.    BP  122/78 (BP  Location: Left Arm, Patient Position: Sitting, Cuff Size: Normal)    Pulse (!) 58    Temp 98.4 F (36.9 C) (Oral)    Resp 18    Ht 5\' 5"  (1.651 m)    Wt 160 lb 3.2 oz (72.7 kg)    SpO2 96%    BMI 26.66 kg/m  Wt Readings from Last 3 Encounters:  06/19/21 160 lb 3.2 oz (72.7 kg)  06/05/21 160 lb (72.6 kg)  05/19/21 160 lb (72.6 kg)     Health Maintenance Due  Topic Date Due   Zoster Vaccines- Shingrix (1 of 2) Never done   TETANUS/TDAP  10/13/2020    There are no preventive care reminders to display for this patient.  Lab Results  Component Value Date   TSH 1.26 04/06/2019   Lab Results  Component Value Date   WBC 6.1 06/05/2021   HGB 13.2 06/05/2021   HCT 38.9 06/05/2021   MCV 93.2 06/05/2021   PLT 278.0 06/05/2021   Lab Results  Component Value Date   NA 136 06/05/2021   K 4.2 06/05/2021   CO2 31 06/05/2021   GLUCOSE 91 06/05/2021   BUN 19 06/05/2021   CREATININE 0.59 06/05/2021   BILITOT 0.9 06/05/2021   ALKPHOS 47 06/05/2021   AST 20 06/05/2021   ALT 14 06/05/2021   PROT 7.1 06/05/2021   ALBUMIN 4.6 06/05/2021   CALCIUM 9.8 06/05/2021   ANIONGAP 7 07/13/2019   GFR 88.88 06/05/2021   Lab Results  Component Value Date   CHOL 190 06/05/2021   Lab Results  Component Value Date   HDL 84.10 06/05/2021   Lab Results  Component Value Date   LDLCALC 93 06/05/2021   Lab Results  Component Value Date   TRIG 65.0 06/05/2021   Lab Results  Component Value Date   CHOLHDL 2 06/05/2021   Lab Results  Component Value Date   HGBA1C 5.4 07/10/2019      Assessment & Plan:   Problem List Items Addressed This Visit       Unprioritized   Primary hypertension - Primary   Essential hypertension    Well controlled, no changes to meds. Encouraged heart healthy diet such as the DASH diet and exercise as tolerated.       Spinal stenosis of lumbar region    Ortho pending  Pt referral placed       Relevant Orders   Ambulatory referral to Physical Therapy     No orders of the defined types were placed in this encounter.   Follow-up: Return in about 6 months (around 12/17/2021), or if symptoms worsen or fail to improve, for hypertension, hyperlipidemia.    Ann Held, DO

## 2021-07-03 ENCOUNTER — Other Ambulatory Visit: Payer: Self-pay

## 2021-07-03 ENCOUNTER — Ambulatory Visit: Payer: Medicare Other | Attending: Family Medicine | Admitting: Physical Therapy

## 2021-07-03 ENCOUNTER — Encounter: Payer: Self-pay | Admitting: Physical Therapy

## 2021-07-03 DIAGNOSIS — M48061 Spinal stenosis, lumbar region without neurogenic claudication: Secondary | ICD-10-CM | POA: Insufficient documentation

## 2021-07-03 DIAGNOSIS — M6281 Muscle weakness (generalized): Secondary | ICD-10-CM | POA: Insufficient documentation

## 2021-07-03 DIAGNOSIS — R2689 Other abnormalities of gait and mobility: Secondary | ICD-10-CM | POA: Insufficient documentation

## 2021-07-03 DIAGNOSIS — R2681 Unsteadiness on feet: Secondary | ICD-10-CM | POA: Diagnosis not present

## 2021-07-03 NOTE — Patient Instructions (Signed)
° ° °  Access Code: [email or web address] URL: [email or web address] Date: 07/03/2021 Prepared by: Annie Paras  Exercises Hooklying Hamstring Stretch with Strap - 2 x daily - 7 x weekly - 3 reps - 30 sec hold Supine Figure 4 Piriformis Stretch - 2 x daily - 7 x weekly - 3 reps - 30 sec hold Supine Piriformis Stretch with Foot on Ground - 2 x daily - 7 x weekly - 3 reps - 30 sec hold Hooklying Single Leg Bent Knee Fallouts with Resistance - 1 x daily - 5 x weekly - 2 sets - 10 reps - 3 sec hold Supine March with Resistance Band - 1 x daily - 5 x weekly - 2 sets - 10 reps - 2-3 sec hold hold Bridge with Resistance - 1 x daily - 5 x weekly - 2 sets - 10 reps - 5 sec hold

## 2021-07-03 NOTE — Therapy (Signed)
[facility] [address]  [place], [place], [address] Phone: [phone]   Fax:  [phone]  Physical Therapy Evaluation  Patient Details  Name: Tina [name] MRN: [id number] Date of Birth: August 05, 1945 Referring Provider (PT): Roma Schanz, [place]   Encounter Date: 07/03/2021   PT End of Session - 07/03/21 9326     Visit Number 1    Number of Visits 13    Date for PT Re-Evaluation 08/14/21    Authorization Type UHC Medicare    PT Start Time 0928    PT Stop Time 1020    PT Time Calculation (min) 52 min    Activity Tolerance Patient tolerated treatment well    Behavior During Therapy [facility] for tasks assessed/performed             Past Medical History:  Diagnosis Date   Allergy    seasonal   Cataract    removed both eyes   Fatty liver    Goiter    treated   HTN (hypertension)    Hyperlipidemia    Myocardial infarct ([place]) hx of 2004   NSTEMI with stent   Osteoporosis    Patellar fracture    Sleep apnea    no cpap   Wrist fracture    right wrist    Past Surgical History:  Procedure Laterality Date   CARDIAC CATHETERIZATION  2004   stent   CATARACT EXTRACTION, BILATERAL  05/2020   COLONOSCOPY     POLYPECTOMY     PROCTOSCOPY N/A 07/12/2019   Procedure: RIGID PROCTOSCOPY;  Surgeon: Michael Boston, MD;  Location: [facility];  Service: General;  Laterality: N/A;   sleep study  2012    There were no vitals filed for this visit.    Subjective Assessment - 07/03/21 0930     Subjective In Dec, pt reports a pain in her R groin and down into her thigh. R/o for blood clots and thought to be muscle spasms. Muscle relaxants didn't help at the time but now resolved. Then had a fall where her R leg collapsed and she went down on her knee. Notes increased weakness in R LE which limits mobility and causes her to modify the way she moves - i.e. step-to pattern on stairs and struggles to get up from the floor. Has  injured both knees in the past few years - has slipped on wet cement on 2 different occasions where she has landed on one knee each time (L knee on first fall 04/2020 with effusion but no fracture, R knee on 2nd fall ~10/2020- subacute fracture identified on imaging at the time).    Diagnostic tests 06/07/21 - lumbar MRI: 1. Symptomatic level favored to be L3-L4 where there is a cephalad disc extrusion into the right neural foramen. Query Right L3 radiculitis. Up to 9 mm sequestered disc fragment.   2. Generally mild for age lumbar spine degeneration otherwise. No lumbar spinal stenosis. Other mild neural foraminal stenosis, up to moderate at left L5 nerve level.    Patient Stated Goals "to work on the weakness in my legs so I can go up and down strairs normally"    Currently in Pain? No/denies                [facility] PT Assessment - 07/03/21 7124       Assessment   Medical Diagnosis Lumbar spinal stenosis    Referring Provider (PT) Roma Schanz, DO  Onset Date/Surgical Date --   Dec 2022   Hand Dominance Right    Next MD Visit 12/18/21 - routine bloodwork f/u in 6 months    Prior Therapy none      Precautions   Precautions None      Restrictions   Weight Bearing Restrictions No      Balance Screen   Has the patient fallen in the past 6 months Yes    How many times? 2    Has the patient had a decrease in activity level because of a fear of falling?  Yes    Is the patient reluctant to leave their home because of a fear of falling?  No      Home Environment   Living Environment Private residence    Living Arrangements Children   daughter   Type of [place] to enter    Entrance Stairs-Number of Steps 3    Entrance Stairs-Rails Left    Home Layout One level    [place] - single point;Crutches      Prior Function   Level of [place] Retired    Leisure walk the dog up to 30 mins M-F; used to walk on TM up to 3.6  miles/hr for 30+ min (tries to get 10K steps per day)      Cognition   Overall Cognitive Status Within Functional Limits for tasks assessed      ROM / Strength   AROM / PROM / Strength AROM;Strength      AROM   Overall AROM Comments pt denies pain but notes tightness with most motions    AROM Assessment Site Lumbar    Lumbar Flexion hands to 2" above ankles    Lumbar Extension WFL    Lumbar - Right Side Bend hand to lateral knee    Lumbar - Left Side Bend hand to lateral knee    Lumbar - Right Rotation 25% limited    Lumbar - Left Rotation 25% limited      Strength   Strength Assessment Site Hip;Knee;Ankle    Right/Left Hip Right;Left    Right Hip Flexion 3+/5    Right Hip Extension 3+/5    Right Hip External Rotation  3+/5    Right Hip Internal Rotation 4/5    Right Hip ABduction 3+/5    Right Hip ADduction 3-/5    Left Hip Flexion 4-/5    Left Hip Extension 4-/5    Left Hip External Rotation 4-/5    Left Hip Internal Rotation 4+/5    Left Hip ABduction 3+/5    Left Hip ADduction 3+/5    Right/Left Knee Right;Left    Right Knee Flexion 4+/5    Right Knee Extension 4+/5    Left Knee Flexion 5/5    Left Knee Extension 5/5    Right/Left Ankle Right;Left    Right Ankle Dorsiflexion 4+/5    Right Ankle Plantar Flexion 5/5   5-/5 - 20 SLS heel raises but increased effort relative to L   Left Ankle Dorsiflexion 5/5    Left Ankle Plantar Flexion 5/5   20 SLS heel raises     Flexibility   Soft Tissue Assessment /Muscle Length yes    Hamstrings mild tight R>L    Quadriceps very mild tight B    Piriformis mod tight R, mild tight L  Objective measurements completed on examination: See above findings.                PT Education - 07/03/21 1015     Education Details PT eval findings, anticipated POC and initial HEP - Access Code: [email or web address]    Person(s) Educated Patient    Methods Explanation;Demonstration;Verbal  cues;Tactile cues;Handout    Comprehension Verbalized understanding;Verbal cues required;Tactile cues required;Returned demonstration;Need further instruction              PT Short Term Goals - 07/03/21 1020       PT SHORT TERM GOAL #1   Title Patient will be independent in initial HEP to improve strength/mobility for better functional independence with ADLs    Status New    Target Date 07/24/21      PT SHORT TERM GOAL #2   Title Complete balance assessment and update LTGs as indicated    Status New    Target Date 07/17/21      PT SHORT TERM GOAL #3   Title Patient will verbalize understanding of fall prevention measures in home to reduce risk for falls    Status New    Target Date 07/24/21      PT SHORT TERM GOAL #4   Title Patient will verbalize/demonstrate good awareness of neutral spine posture and proper body mechanics for daily tasks    Status New    Target Date 07/24/21               PT Long Term Goals - 07/03/21 1020       PT LONG TERM GOAL #1   Title Patient will be independent with ongoing/advanced HEP for improved strength, balance, transfers and gait    Status New    Target Date 08/14/21      PT LONG TERM GOAL #2   Title Patient will demonstrate improved B LE strength to >/= 4/5 to 4+/5 for improved stability and ease of mobility    Status New    Target Date 08/14/21      PT LONG TERM GOAL #3   Title Patient able to ascend/descend stairs with reciprocal gait and single rail support with good mechanics    Status New    Target Date 08/14/21      PT LONG TERM GOAL #4   Title Patient will report increased ease of transfers to/from floor for safe mobility within home    Status New    Target Date 08/14/21      PT LONG TERM GOAL #5   Title Patient to report ability to perform ADLs, household tasks and leisure activities without limitation due to weakness or LE instability    Status New    Target Date 08/14/21                    Plan  - 07/03/21 1020     Clinical Impression Statement Tia is a 76 y/o female who presents to OP PT for lumbar stenosis. She denies LBP but reports a period in December 2022 of R groin and thigh pain that was attributed to radiculopathy. Pain now resolved but she continues to note R LE weakness which has recently caused her R leg to collapse leading to a fall. She notes at least 1 other fall in the past 6 months and 2 falls in the past few years that lead to trauma to B knees. LE weakness causes her to modify how she moves including resorting to step-to pattern  with stair negotiation and making it difficult for her to get up from the floor. Current deficits include mildly limited lumbar ROM d/t tightness but no pain, decreased proximal LE flexibility, and R>L proximal LE weakness. Mariabelen will benefit from skilled PT to address above deficits and improve proximal LE flexibility and core/LE strength to increase ease of transitional mobility along with improved gait stability and stair negotiation. Given h/o falls, she may also benefit from formal balance assessment with goals to be established based on findings.    Personal Factors and Comorbidities Age;Comorbidity 3+;Past/Current Experience;Time since onset of injury/illness/exacerbation    Comorbidities Lumbar stenosis, HTN, MI, sinus bradycardia, osteoporosis, B knee trauma d/t falls    Examination-Activity Limitations Bend;Caring for Others;Locomotion Level;Squat;Stairs;Transfers    Examination-Participation Restrictions Cleaning;Community Activity;Laundry;Shop;Yard Work    [employer] Evolving/Moderate complexity    Clinical Decision Making Moderate    Rehab Potential Good    PT Frequency 2x / week    PT Duration 6 weeks    PT Treatment/Interventions ADLs/Self Care Home Management;Cryotherapy;Electrical Stimulation;Iontophoresis 4mg /ml Dexamethasone;Moist Heat;Gait training;Stair training;Functional mobility training;Therapeutic  activities;Therapeutic exercise;Balance training;Neuromuscular re-education;Manual techniques;Passive range of motion;Dry needling;Taping;Spinal Manipulations    PT Next Visit Plan Review initial HEP; progress lumbopelvic and proximal LE strengthening; next visit with PT - balance assessment    PT Home Exercise Plan Access Code: [email or web address] (2/2)    Consulted and Agree with Plan of Care Patient             Patient will benefit from skilled therapeutic intervention in order to improve the following deficits and impairments:  Decreased activity tolerance, Decreased balance, Decreased endurance, Decreased mobility, Decreased range of motion, Decreased strength, Difficulty walking, Impaired perceived functional ability, Impaired flexibility, Improper body mechanics, Postural dysfunction, Pain  Visit Diagnosis: Muscle weakness (generalized) - Plan: PT plan of care cert/re-cert  Other abnormalities of gait and mobility - Plan: PT plan of care cert/re-cert  Unsteadiness on feet - Plan: PT plan of care cert/re-cert     Problem List Patient Active Problem List   Diagnosis Date Noted   Spinal stenosis of lumbar region 06/19/2021   Low back pain with radiation 06/05/2021   Right leg weakness 06/05/2021   Primary hypertension 06/05/2021   Pain in left shin 01/01/2020   Adenomatous polyp s/p LAR rectosigmoid resection 07/12/2019 05/15/2019   Age-related osteoporosis without current pathological fracture 04/06/2019   Sinus bradycardia 02/24/2017   Post-menopausal bleeding 10/28/2016   Left shoulder pain 01/22/2016   Right wrist injury 03/18/2015   Obesity (BMI 30-39.9) 05/29/2013   Otitis externa 09/30/2010   CONJUNCTIVITIS, BACTERIAL 12/16/2009   SINUSITIS- ACUTE-NOS 08/01/2009   TOBACCO ABUSE, HX OF 07/22/2009   OTITIS EXTERNA, ACUTE, RIGHT 02/08/2009   Goiter 02/10/2007   Hyperlipidemia 02/10/2007   Essential hypertension 02/10/2007   MYOCARDIAL INFARCTION, HX OF 02/10/2007    DIZZINESS 02/10/2007   EDEMA LEG 02/10/2007   FATTY LIVER DISEASE, HX OF 02/10/2007    Percival Spanish, PT 07/03/2021, 1:48 PM  [facility] [address]  [name], [place], [address] Phone: [phone]   Fax:  [phone]  Name: Tina [name] MRN: [id number] Date of Birth: February 18, 1946

## 2021-07-08 ENCOUNTER — Other Ambulatory Visit: Payer: Self-pay

## 2021-07-08 ENCOUNTER — Ambulatory Visit: Payer: Medicare Other

## 2021-07-08 DIAGNOSIS — R2689 Other abnormalities of gait and mobility: Secondary | ICD-10-CM

## 2021-07-08 DIAGNOSIS — M6281 Muscle weakness (generalized): Secondary | ICD-10-CM | POA: Diagnosis not present

## 2021-07-08 DIAGNOSIS — R2681 Unsteadiness on feet: Secondary | ICD-10-CM | POA: Diagnosis not present

## 2021-07-08 DIAGNOSIS — M48061 Spinal stenosis, lumbar region without neurogenic claudication: Secondary | ICD-10-CM | POA: Diagnosis not present

## 2021-07-08 NOTE — Therapy (Signed)
[facility] [address]  [place], [place], [address] Phone: [phone]   Fax:  [phone]  Physical Therapy Treatment  Patient Details  Name: Tina [name] MRN: [id number] Date of Birth: 1945/10/30 Referring Provider (PT): Roma Schanz, [place]   Encounter Date: 07/08/2021   PT End of Session - 07/08/21 1044     Visit Number 2    Number of Visits 13    Date for PT Re-Evaluation 08/14/21    Authorization Type UHC Medicare    PT Start Time [id number]    PT Stop Time 1019    PT Time Calculation (min) 41 min    Activity Tolerance Patient tolerated treatment well    Behavior During Therapy [facility] for tasks assessed/performed             Past Medical History:  Diagnosis Date   Allergy    seasonal   Cataract    removed both eyes   Fatty liver    Goiter    treated   HTN (hypertension)    Hyperlipidemia    Myocardial infarct ([place]) hx of 2004   NSTEMI with stent   Osteoporosis    Patellar fracture    Sleep apnea    no cpap   Wrist fracture    right wrist    Past Surgical History:  Procedure Laterality Date   CARDIAC CATHETERIZATION  2004   stent   CATARACT EXTRACTION, BILATERAL  05/2020   COLONOSCOPY     POLYPECTOMY     PROCTOSCOPY N/A 07/12/2019   Procedure: RIGID PROCTOSCOPY;  Surgeon: Michael Boston, MD;  Location: [facility];  Service: General;  Laterality: N/A;   sleep study  2012    There were no vitals filed for this visit.   Subjective Assessment - 07/08/21 0940     Subjective "I don't really have pain but with certain things I can feel it in my leg.    Diagnostic tests 06/07/21 - lumbar MRI: 1. Symptomatic level favored to be L3-L4 where there is a cephalad disc extrusion into the right neural foramen. Query Right L3 radiculitis. Up to 9 mm sequestered disc fragment.   2. Generally mild for age lumbar spine degeneration otherwise. No lumbar spinal stenosis. Other mild neural foraminal stenosis, up  to moderate at left L5 nerve level.    Patient Stated Goals "to work on the weakness in my legs so I can go up and down strairs normally"    Currently in Pain? No/denies                               [facility] Adult PT Treatment/Exercise - 07/08/21 0001       Exercises   Exercises Knee/Hip      Knee/Hip Exercises: Stretches   Passive Hamstring Stretch Right;30 seconds    Passive Hamstring Stretch Limitations hooklying with strap    Piriformis Stretch Right;30 seconds;2 reps    Piriformis Stretch Limitations 1 set figure 4 stretch, 1 set KTOS      Knee/Hip Exercises: Aerobic   Nustep L3x51min      Knee/Hip Exercises: Standing   Other Standing Knee Exercises church pew 10x      Knee/Hip Exercises: Seated   Sit to Sand --   tried but showed increase L side WS, R knee genu valgus     Knee/Hip Exercises: Supine   Short Arc Quad Sets AROM;Both;2 sets;10  reps    Hip Adduction Isometric Strengthening;Both;10 reps   5 sec hold   Bridges with Clamshell Strengthening;Both;10 reps   RTB, cuing to keep tension on TB   Other Supine Knee/Hip Exercises clamshells with RTB, reviewed    Other Supine Knee/Hip Exercises marches with RTB, reviewed                       PT Short Term Goals - 07/08/21 1045       PT SHORT TERM GOAL #1   Title Patient will be independent in initial HEP to improve strength/mobility for better functional independence with ADLs    Status On-going    Target Date 07/24/21      PT SHORT TERM GOAL #2   Title Complete balance assessment and update LTGs as indicated    Status On-going    Target Date 07/17/21      PT SHORT TERM GOAL #3   Title Patient will verbalize understanding of fall prevention measures in home to reduce risk for falls    Status On-going    Target Date 07/24/21      PT SHORT TERM GOAL #4   Title Patient will verbalize/demonstrate good awareness of neutral spine posture and proper body mechanics for daily tasks     Status New    Target Date 07/24/21               PT Long Term Goals - 07/08/21 1045       PT LONG TERM GOAL #1   Title Patient will be independent with ongoing/advanced HEP for improved strength, balance, transfers and gait    Status On-going    Target Date 08/14/21      PT LONG TERM GOAL #2   Title Patient will demonstrate improved B LE strength to >/= 4/5 to 4+/5 for improved stability and ease of mobility    Status On-going    Target Date 08/14/21      PT LONG TERM GOAL #3   Title Patient able to ascend/descend stairs with reciprocal gait and single rail support with good mechanics    Status On-going    Target Date 08/14/21      PT LONG TERM GOAL #4   Title Patient will report increased ease of transfers to/from floor for safe mobility within home    Status On-going    Target Date 08/14/21      PT LONG TERM GOAL #5   Title Patient to report ability to perform ADLs, household tasks and leisure activities without limitation due to weakness or LE instability    Status On-going    Target Date 08/14/21                   Plan - 07/08/21 1048     Clinical Impression Statement Reviewed HEP with pt, she was confused on how far to push ROM with the stretches, I instructed her to continue with ROM until she felt a stretch in the correct muscles but no pain. She did require more instruction with the bridges w/ clamshell, to keep tension on the TB. Tried a STS with her but she showed too much WS to the L side, so we did some ant/post WS/church pews to help with equal WS, maybe try some lateral weight shifting next session.    Personal Factors and Comorbidities Age;Comorbidity 3+;Past/Current Experience;Time since onset of injury/illness/exacerbation    Comorbidities Lumbar stenosis, HTN, MI, sinus bradycardia, osteoporosis, B knee trauma  d/t falls    PT Frequency 2x / week    PT Duration 6 weeks    PT Treatment/Interventions ADLs/Self Care Home  Management;Cryotherapy;Electrical Stimulation;Iontophoresis 4mg /ml Dexamethasone;Moist Heat;Gait training;Stair training;Functional mobility training;Therapeutic activities;Therapeutic exercise;Balance training;Neuromuscular re-education;Manual techniques;Passive range of motion;Dry needling;Taping;Spinal Manipulations    PT Next Visit Plan progress lumbopelvic and proximal LE strengthening; next visit with PT - balance assessment    PT Home Exercise Plan Access Code: [email or web address] (2/2)    Consulted and Agree with Plan of Care Patient             Patient will benefit from skilled therapeutic intervention in order to improve the following deficits and impairments:  Decreased activity tolerance, Decreased balance, Decreased endurance, Decreased mobility, Decreased range of motion, Decreased strength, Difficulty walking, Impaired perceived functional ability, Impaired flexibility, Improper body mechanics, Postural dysfunction, Pain  Visit Diagnosis: Muscle weakness (generalized)  Other abnormalities of gait and mobility  Unsteadiness on feet     Problem List Patient Active Problem List   Diagnosis Date Noted   Spinal stenosis of lumbar region 06/19/2021   Low back pain with radiation 06/05/2021   Right leg weakness 06/05/2021   Primary hypertension 06/05/2021   Pain in left shin 01/01/2020   Adenomatous polyp s/p LAR rectosigmoid resection 07/12/2019 05/15/2019   Age-related osteoporosis without current pathological fracture 04/06/2019   Sinus bradycardia 02/24/2017   Post-menopausal bleeding 10/28/2016   Left shoulder pain 01/22/2016   Right wrist injury 03/18/2015   Obesity (BMI 30-39.9) 05/29/2013   Otitis externa 09/30/2010   CONJUNCTIVITIS, BACTERIAL 12/16/2009   SINUSITIS- ACUTE-NOS 08/01/2009   TOBACCO ABUSE, HX OF 07/22/2009   OTITIS EXTERNA, ACUTE, RIGHT 02/08/2009   Goiter 02/10/2007   Hyperlipidemia 02/10/2007   Essential hypertension 02/10/2007   MYOCARDIAL  INFARCTION, HX OF 02/10/2007   DIZZINESS 02/10/2007   EDEMA LEG 02/10/2007   FATTY LIVER DISEASE, HX OF 02/10/2007    Artist Pais, PTA 07/08/2021, 11:21 AM  [facility] [address]  [place], [place], [address] Phone: [phone]   Fax:  [phone]  Name: Tina [name] MRN: [id number] Date of Birth: 07/23/1945

## 2021-07-11 ENCOUNTER — Other Ambulatory Visit: Payer: Self-pay

## 2021-07-11 ENCOUNTER — Ambulatory Visit: Payer: Medicare Other | Admitting: Physical Therapy

## 2021-07-11 DIAGNOSIS — R2681 Unsteadiness on feet: Secondary | ICD-10-CM | POA: Diagnosis not present

## 2021-07-11 DIAGNOSIS — R2689 Other abnormalities of gait and mobility: Secondary | ICD-10-CM | POA: Diagnosis not present

## 2021-07-11 DIAGNOSIS — M6281 Muscle weakness (generalized): Secondary | ICD-10-CM

## 2021-07-11 DIAGNOSIS — M48061 Spinal stenosis, lumbar region without neurogenic claudication: Secondary | ICD-10-CM | POA: Diagnosis not present

## 2021-07-11 NOTE — Therapy (Signed)
[facility] [address]  [place], [place], [address] Phone: [phone]   Fax:  [phone]  Physical Therapy Treatment  Patient Details  Name: Tina [name] MRN: [id number] Date of Birth: May 03, 1946 Referring Provider (PT): Roma Schanz, [place]   Encounter Date: 07/11/2021   PT End of Session - 07/11/21 1016     Visit Number 3    Number of Visits 13    Date for PT Re-Evaluation 08/14/21    Authorization Type [employer] Medicare    PT Start Time 3704    PT Stop Time 1102    PT Time Calculation (min) 46 min    Activity Tolerance Patient tolerated treatment well    Behavior During Therapy [facility] for tasks assessed/performed             Past Medical History:  Diagnosis Date   Allergy    seasonal   Cataract    removed both eyes   Fatty liver    Goiter    treated   HTN (hypertension)    Hyperlipidemia    Myocardial infarct ([place]) hx of 2004   NSTEMI with stent   Osteoporosis    Patellar fracture    Sleep apnea    no cpap   Wrist fracture    right wrist    Past Surgical History:  Procedure Laterality Date   CARDIAC CATHETERIZATION  2004   stent   CATARACT EXTRACTION, BILATERAL  05/2020   COLONOSCOPY     POLYPECTOMY     PROCTOSCOPY N/A 07/12/2019   Procedure: RIGID PROCTOSCOPY;  Surgeon: Michael Boston, MD;  Location: [facility];  Service: General;  Laterality: N/A;   sleep study  2012    There were no vitals filed for this visit.   Subjective Assessment - 07/11/21 1019     Subjective Pt reports issues with her balance with quick movements such as when her dog walk in front of her.    Diagnostic tests 06/07/21 - lumbar MRI: 1. Symptomatic level favored to be L3-L4 where there is a cephalad disc extrusion into the right neural foramen. Query Right L3 radiculitis. Up to 9 mm sequestered disc fragment.   2. Generally mild for age lumbar spine degeneration otherwise. No lumbar spinal stenosis. Other mild  neural foraminal stenosis, up to moderate at left L5 nerve level.    Patient Stated Goals "to work on the weakness in my legs so I can go up and down strairs normally"                [facility] PT Assessment - 07/11/21 1016       Functional Tests   Functional tests Single leg stance      Single Leg Stance   Comments R = 21.38 sec; L = 6.44 sec      Ambulation/Gait   Gait velocity - backwards 4.0 ft/sec      Standardized Balance Assessment   Standardized Balance Assessment Berg Balance Test;Timed Up and Go Test;Five Times Sit to Stand;10 meter walk test    Five times sit to stand comments  15.12   requires R UE assist   10 Meter Walk 8.19 sec      Berg Balance Test   Sit to Stand Able to stand  independently using hands    Standing Unsupported Able to stand safely 2 minutes    Sitting with Back Unsupported but Feet Supported on Floor or Stool Able to sit safely and  securely 2 minutes    Stand to Sit Sits safely with minimal use of hands    Transfers Able to transfer safely, minor use of hands    Standing Unsupported with Eyes Closed Able to stand 10 seconds with supervision    Standing Unsupported with Feet Together Able to place feet together independently and stand 1 minute safely    From Standing, Reach Forward with Outstretched Arm Can reach forward >12 cm safely (5")    From Standing Position, Pick up Object from Floor Able to pick up shoe safely and easily    From Standing Position, Turn to Look Behind Over each Shoulder Looks behind one side only/other side shows less weight shift    Turn 360 Degrees Able to turn 360 degrees safely in 4 seconds or less    Standing Unsupported, Alternately Place Feet on Step/Stool Able to complete 4 steps without aid or supervision    Standing Unsupported, One Foot in Front Able to plae foot ahead of the other independently and hold 30 seconds    Standing on One Leg Able to lift leg independently and hold > 10 seconds   R = 21.38 sec; L =  6.44 sec   Total Score 49    Berg comment: 46-51 moderate fall risk (>50%)      Timed Up and Go Test   Normal TUG (seconds) 9.69      Functional Gait  Assessment   Gait assessed  Yes    Gait Level Surface Walks 20 ft in less than 5.5 sec, no assistive devices, good speed, no evidence for imbalance, normal gait pattern, deviates no more than 6 in outside of the 12 in walkway width.    Change in Gait Speed Able to smoothly change walking speed without loss of balance or gait deviation. Deviate no more than 6 in outside of the 12 in walkway width.    Gait with Horizontal Head Turns Performs head turns smoothly with no change in gait. Deviates no more than 6 in outside 12 in walkway width    Gait with Vertical Head Turns Performs head turns with no change in gait. Deviates no more than 6 in outside 12 in walkway width.    Gait and Pivot Turn Pivot turns safely within 3 sec and stops quickly with no loss of balance.    Step Over Obstacle Is able to step over 2 stacked shoe boxes taped together (9 in total height) without changing gait speed. No evidence of imbalance.    Gait with Narrow Base of Support Ambulates 4-7 steps.    Gait with Eyes Closed Walks 20 ft, slow speed, abnormal gait pattern, evidence for imbalance, deviates 10-15 in outside 12 in walkway width. Requires more than 9 sec to ambulate 20 ft.    Ambulating Backwards Walks 20 ft, uses assistive device, slower speed, mild gait deviations, deviates 6-10 in outside 12 in walkway width.    Steps Two feet to a stair, must use rail.    Total Score 23    FGA comment: 19-24 = medium risk fall                           [facility] Adult PT Treatment/Exercise - 07/11/21 1016       Knee/Hip Exercises: Standing   Hip Flexion Right;Left;10 reps;Stengthening;Knee bent    Hip Flexion Limitations slow march    Hip Abduction Right;Left;10 reps;Stengthening;Knee straight    Abduction Limitations cues  for pacing with ~3" hold/pause     Hip Extension Right;Left;10 reps;Stengthening;Knee straight    Extension Limitations cues for pacing with ~3" hold/pause    Functional Squat 10 reps;3 seconds    Functional Squat Limitations counter squat - VC & TC for even weight shift (pt shifting to L LE)                       PT Short Term Goals - 07/11/21 1100       PT SHORT TERM GOAL #1   Title Patient will be independent in initial HEP to improve strength/mobility for better functional independence with ADLs    Status Achieved   07/11/21     PT SHORT TERM GOAL #2   Title Complete balance assessment and update LTGs as indicated    Status Achieved   07/11/21     PT SHORT TERM GOAL #3   Title Patient will verbalize understanding of fall prevention measures in home to reduce risk for falls    Status On-going    Target Date 07/24/21      PT SHORT TERM GOAL #4   Title Patient will verbalize/demonstrate good awareness of neutral spine posture and proper body mechanics for daily tasks    Status On-going    Target Date 07/24/21               PT Long Term Goals - 07/11/21 1240       PT LONG TERM GOAL #1   Title Patient will be independent with ongoing/advanced HEP for improved strength, balance, transfers and gait    Status On-going    Target Date 08/14/21      PT LONG TERM GOAL #2   Title Patient will demonstrate improved B LE strength to >/= 4/5 to 4+/5 for improved stability and ease of mobility    Status On-going    Target Date 08/14/21      PT LONG TERM GOAL #3   Title Patient able to ascend/descend stairs with reciprocal gait and single rail support with good mechanics    Status On-going    Target Date 08/14/21      PT LONG TERM GOAL #4   Title Patient will report increased ease of transfers to/from floor for safe mobility within home    Status On-going    Target Date 08/14/21      PT LONG TERM GOAL #5   Title Patient to report ability to perform ADLs, household tasks and leisure activities  without limitation due to weakness or LE instability    Status On-going    Target Date 08/14/21      PT LONG TERM GOAL #6   Title Patient will improve 5x STS time to </= 12.6 seconds w/o UE assist to demonstrated improved functional strength and transfer efficiency per age-appropriate norms    Status New    Target Date 08/14/21      PT LONG TERM GOAL #7   Title Patient will improve Berg score to >/= 54/56 to improve safety and stability with ADLs in standing and reduce risk for falls    Status New    Target Date 08/14/21      PT LONG TERM GOAL #8   Title Patient will improve FGA score to >/= 27/30 to improve gait stability and reduce risk for falls    Status New    Target Date 08/14/21  Plan - 07/11/21 1102     Clinical Impression Statement Shiri reports initial HEP going well and feels very comfortable with exercises following the review last session - STG #1 met. Standardized balance testing completed (STG #2 met) revealing a medium/moderate fall risk per 5xSTS time of 15.12 sec, Berg score of 49/56 and FGA score of 23/30, although TUG time of 9.69 sec w/o AD WNL. Her gait speed is near normal at a pace of 4.0 ft/sec. We reviewed assessment findings and potential for PT to help improve on these limitations. Progressed strengthening to introduce standing exercise with VC and TC cues necessary for symmetrical weight shift during supported squats as well as VC for pacing and increased hold times with all exercises. She will continue to benefit from weight shifting activities to promote more symmetrical stance/alignment, movement and balance.    Comorbidities Lumbar stenosis, HTN, MI, sinus bradycardia, osteoporosis, B knee trauma d/t falls    Rehab Potential Good    PT Frequency 2x / week    PT Duration 6 weeks    PT Treatment/Interventions ADLs/Self Care Home Management;Cryotherapy;Electrical Stimulation;Iontophoresis 2m/ml Dexamethasone;Moist Heat;Gait  training;Stair training;Functional mobility training;Therapeutic activities;Therapeutic exercise;Balance training;Neuromuscular re-education;Manual techniques;Passive range of motion;Dry needling;Taping;Spinal Manipulations    PT Next Visit Plan progress lumbopelvic and proximal LE strengthening; posture and body mechanics education; fall risk prevention education    PT Home Exercise Plan Access Code: [email or web address] (2/2)    Consulted and Agree with Plan of Care Patient             Patient will benefit from skilled therapeutic intervention in order to improve the following deficits and impairments:  Decreased activity tolerance, Decreased balance, Decreased endurance, Decreased mobility, Decreased range of motion, Decreased strength, Difficulty walking, Impaired perceived functional ability, Impaired flexibility, Improper body mechanics, Postural dysfunction, Pain  Visit Diagnosis: Muscle weakness (generalized)  Other abnormalities of gait and mobility  Unsteadiness on feet     Problem List Patient Active Problem List   Diagnosis Date Noted   Spinal stenosis of lumbar region 06/19/2021   Low back pain with radiation 06/05/2021   Right leg weakness 06/05/2021   Primary hypertension 06/05/2021   Pain in left shin 01/01/2020   Adenomatous polyp s/p LAR rectosigmoid resection 07/12/2019 05/15/2019   Age-related osteoporosis without current pathological fracture 04/06/2019   Sinus bradycardia 02/24/2017   Post-menopausal bleeding 10/28/2016   Left shoulder pain 01/22/2016   Right wrist injury 03/18/2015   Obesity (BMI 30-39.9) 05/29/2013   Otitis externa 09/30/2010   CONJUNCTIVITIS, BACTERIAL 12/16/2009   SINUSITIS- ACUTE-NOS 08/01/2009   TOBACCO ABUSE, HX OF 07/22/2009   OTITIS EXTERNA, ACUTE, RIGHT 02/08/2009   Goiter 02/10/2007   Hyperlipidemia 02/10/2007   Essential hypertension 02/10/2007   MYOCARDIAL INFARCTION, HX OF 02/10/2007   DIZZINESS 02/10/2007   EDEMA LEG 02/10/2007    FATTY LIVER DISEASE, HX OF 02/10/2007    JPercival Spanish PT 07/11/2021, 1:00 PM  [facility] [address] [place] [address]: [phone]  Fax:  [phone] Name: ATARRIN MENNMRN: [id number] of Birth: [birth year]

## 2021-07-14 ENCOUNTER — Telehealth: Payer: Self-pay

## 2021-07-14 NOTE — Telephone Encounter (Signed)
Prolia VOB initiated via [email or web address] ° °NEW START ° °

## 2021-07-15 ENCOUNTER — Other Ambulatory Visit: Payer: Self-pay

## 2021-07-15 ENCOUNTER — Ambulatory Visit: Payer: Medicare Other

## 2021-07-15 DIAGNOSIS — M48061 Spinal stenosis, lumbar region without neurogenic claudication: Secondary | ICD-10-CM | POA: Diagnosis not present

## 2021-07-15 DIAGNOSIS — R2689 Other abnormalities of gait and mobility: Secondary | ICD-10-CM

## 2021-07-15 DIAGNOSIS — M6281 Muscle weakness (generalized): Secondary | ICD-10-CM | POA: Diagnosis not present

## 2021-07-15 DIAGNOSIS — R2681 Unsteadiness on feet: Secondary | ICD-10-CM | POA: Diagnosis not present

## 2021-07-15 NOTE — Therapy (Signed)
[facility] [address]  [place], [place], [address] Phone: [phone]   Fax:  [phone]  Physical Therapy Treatment  Patient Details  Name: Tina [name] MRN: [id number] Date of Birth: 1946-05-31 Referring Provider (PT): Roma Schanz, [place]   Encounter Date: 07/15/2021   PT End of Session - 07/15/21 1104     Visit Number 4    Number of Visits 13    Date for PT Re-Evaluation 08/14/21    Authorization Type UHC Medicare    PT Start Time 1016    PT Stop Time 1104    PT Time Calculation (min) 48 min    Activity Tolerance Patient tolerated treatment well    Behavior During Therapy [facility] for tasks assessed/performed             Past Medical History:  Diagnosis Date   Allergy    seasonal   Cataract    removed both eyes   Fatty liver    Goiter    treated   HTN (hypertension)    Hyperlipidemia    Myocardial infarct ([place]) hx of 2004   NSTEMI with stent   Osteoporosis    Patellar fracture    Sleep apnea    no cpap   Wrist fracture    right wrist    Past Surgical History:  Procedure Laterality Date   CARDIAC CATHETERIZATION  2004   stent   CATARACT EXTRACTION, BILATERAL  05/2020   COLONOSCOPY     POLYPECTOMY     PROCTOSCOPY N/A 07/12/2019   Procedure: RIGID PROCTOSCOPY;  Surgeon: Michael Boston, MD;  Location: [facility];  Service: General;  Laterality: N/A;   sleep study  2012    There were no vitals filed for this visit.   Subjective Assessment - 07/15/21 1019     Subjective "Been doing good, balance has been my focus lately. "    Diagnostic tests 06/07/21 - lumbar MRI: 1. Symptomatic level favored to be L3-L4 where there is a cephalad disc extrusion into the right neural foramen. Query Right L3 radiculitis. Up to 9 mm sequestered disc fragment.   2. Generally mild for age lumbar spine degeneration otherwise. No lumbar spinal stenosis. Other mild neural foraminal stenosis, up to moderate at left  L5 nerve level.    Patient Stated Goals "to work on the weakness in my legs so I can go up and down strairs normally"    Currently in Pain? No/denies                               [facility] Adult PT Treatment/Exercise - 07/15/21 0001       High Level Balance   High Level Balance Comments stepping fwd with bil arm raise 15 reps R/L      Exercises   Exercises Knee/Hip      Knee/Hip Exercises: Aerobic   Nustep L4x22min      Knee/Hip Exercises: Standing   Hip Flexion Right;Left;10 reps;Stengthening;Knee bent    Hip Abduction Right;Left;10 reps;Stengthening;Knee straight;2 sets    Lateral Step Up Both;10 reps;Hand Hold: 1;Step Height: 4"    Forward Step Up Right;10 reps;Hand Hold: 1;Step Height: 4";Left    Forward Step Up Limitations 5 additional reps done with R leg, no UE support    Other Standing Knee Exercises church pew 20x    Other Standing Knee Exercises retro step R/L leg behind 10x  Knee/Hip Exercises: Supine   Bridges with Diona Foley Squeeze Strengthening;Both;10 reps      Knee/Hip Exercises: Sidelying   Clams R clamshell with RTB 10 reps                     PT Education - 07/15/21 1110     Education Details HEP update, clamshells in S/L    Person(s) Educated Patient    Methods Explanation;Demonstration;Handout    Comprehension Verbalized understanding;Returned demonstration              PT Short Term Goals - 07/11/21 1100       PT SHORT TERM GOAL #1   Title Patient will be independent in initial HEP to improve strength/mobility for better functional independence with ADLs    Status Achieved   07/11/21     PT SHORT TERM GOAL #2   Title Complete balance assessment and update LTGs as indicated    Status Achieved   07/11/21     PT SHORT TERM GOAL #3   Title Patient will verbalize understanding of fall prevention measures in home to reduce risk for falls    Status On-going    Target Date 07/24/21      PT SHORT TERM GOAL #4    Title Patient will verbalize/demonstrate good awareness of neutral spine posture and proper body mechanics for daily tasks    Status On-going    Target Date 07/24/21               PT Long Term Goals - 07/15/21 1133       PT LONG TERM GOAL #1   Title Patient will be independent with ongoing/advanced HEP for improved strength, balance, transfers and gait    Status On-going    Target Date 08/14/21      PT LONG TERM GOAL #2   Title Patient will demonstrate improved B LE strength to >/= 4/5 to 4+/5 for improved stability and ease of mobility    Status On-going    Target Date 08/14/21      PT LONG TERM GOAL #3   Title Patient able to ascend/descend stairs with reciprocal gait and single rail support with good mechanics    Status On-going    Target Date 08/14/21      PT LONG TERM GOAL #4   Title Patient will report increased ease of transfers to/from floor for safe mobility within home    Status On-going    Target Date 08/14/21      PT LONG TERM GOAL #5   Title Patient to report ability to perform ADLs, household tasks and leisure activities without limitation due to weakness or LE instability    Status On-going    Target Date 08/14/21      PT LONG TERM GOAL #6   Title Patient will improve 5x STS time to </= 12.6 seconds w/o UE assist to demonstrated improved functional strength and transfer efficiency per age-appropriate norms    Status New    Target Date 08/14/21      PT LONG TERM GOAL #7   Title Patient will improve Berg score to >/= 54/56 to improve safety and stability with ADLs in standing and reduce risk for falls    Status On-going    Target Date 08/14/21      PT LONG TERM GOAL #8   Title Patient will improve FGA score to >/= 27/30 to improve gait stability and reduce risk for falls    Status On-going  Target Date 08/14/21                   Plan - 07/15/21 1110     Clinical Impression Statement Pt did have initial reports of balance deficits,  mainly while supported on one leg. A lot of the interventions today were focused on improving this deficit. She also noted that she has difficulty with doing stairs at home, we practiced step ups on a 4' step today. Challenged balance, noticed greatest difficulty with hip ABD unsupported and R LE step up. After more practice the step ups did become easier but she could practice more with steps, progressing to standard height.    Comorbidities Lumbar stenosis, HTN, MI, sinus bradycardia, osteoporosis, B knee trauma d/t falls    PT Frequency 2x / week    PT Duration 6 weeks    PT Treatment/Interventions ADLs/Self Care Home Management;Cryotherapy;Electrical Stimulation;Iontophoresis 4mg /ml Dexamethasone;Moist Heat;Gait training;Stair training;Functional mobility training;Therapeutic activities;Therapeutic exercise;Balance training;Neuromuscular re-education;Manual techniques;Passive range of motion;Dry needling;Taping;Spinal Manipulations    PT Next Visit Plan progress lumbopelvic and proximal LE strengthening; posture and body mechanics education; fall risk prevention education    PT Home Exercise Plan Access Code: [email or web address] (2/2)    Consulted and Agree with Plan of Care Patient             Patient will benefit from skilled therapeutic intervention in order to improve the following deficits and impairments:  Decreased activity tolerance, Decreased balance, Decreased endurance, Decreased mobility, Decreased range of motion, Decreased strength, Difficulty walking, Impaired perceived functional ability, Impaired flexibility, Improper body mechanics, Postural dysfunction, Pain  Visit Diagnosis: Muscle weakness (generalized)  Other abnormalities of gait and mobility  Unsteadiness on feet     Problem List Patient Active Problem List   Diagnosis Date Noted   Spinal stenosis of lumbar region 06/19/2021   Low back pain with radiation 06/05/2021   Right leg weakness 06/05/2021   Primary  hypertension 06/05/2021   Pain in left shin 01/01/2020   Adenomatous polyp s/p LAR rectosigmoid resection 07/12/2019 05/15/2019   Age-related osteoporosis without current pathological fracture 04/06/2019   Sinus bradycardia 02/24/2017   Post-menopausal bleeding 10/28/2016   Left shoulder pain 01/22/2016   Right wrist injury 03/18/2015   Obesity (BMI 30-39.9) 05/29/2013   Otitis externa 09/30/2010   CONJUNCTIVITIS, BACTERIAL 12/16/2009   SINUSITIS- ACUTE-NOS 08/01/2009   TOBACCO ABUSE, HX OF 07/22/2009   OTITIS EXTERNA, ACUTE, RIGHT 02/08/2009   Goiter 02/10/2007   Hyperlipidemia 02/10/2007   Essential hypertension 02/10/2007   MYOCARDIAL INFARCTION, HX OF 02/10/2007   DIZZINESS 02/10/2007   EDEMA LEG 02/10/2007   FATTY LIVER DISEASE, HX OF 02/10/2007    Artist Pais, PTA 07/15/2021, 11:34 AM  [facility] [address]  [place], [place], [address] Phone: [phone]   Fax:  [phone]  Name: Tina [name] MRN: [id number] Date of Birth: Jan 26, 1946

## 2021-07-15 NOTE — Patient Instructions (Signed)
Access Code: [email or web address] URL: [email or web address] Date: 07/15/2021 Prepared by: Clarene Essex  Exercises Hooklying Hamstring Stretch with Strap - 2 x daily - 7 x weekly - 3 reps - 30 sec hold Supine Figure 4 Piriformis Stretch - 2 x daily - 7 x weekly - 3 reps - 30 sec hold Supine Piriformis Stretch with Foot on Ground - 2 x daily - 7 x weekly - 3 reps - 30 sec hold Supine March with Resistance Band - 1 x daily - 5 x weekly - 2 sets - 10 reps - 2-3 sec hold hold Bridge with Resistance - 1 x daily - 5 x weekly - 2 sets - 10 reps - 5 sec hold Clamshell with Resistance - 1 x daily - 7 x weekly - 2 sets - 10 reps - 3 second hold

## 2021-07-17 ENCOUNTER — Other Ambulatory Visit: Payer: Self-pay

## 2021-07-17 ENCOUNTER — Ambulatory Visit: Payer: Medicare Other

## 2021-07-17 DIAGNOSIS — M48061 Spinal stenosis, lumbar region without neurogenic claudication: Secondary | ICD-10-CM | POA: Diagnosis not present

## 2021-07-17 DIAGNOSIS — M6281 Muscle weakness (generalized): Secondary | ICD-10-CM

## 2021-07-17 DIAGNOSIS — R2689 Other abnormalities of gait and mobility: Secondary | ICD-10-CM

## 2021-07-17 DIAGNOSIS — R2681 Unsteadiness on feet: Secondary | ICD-10-CM

## 2021-07-17 NOTE — Therapy (Signed)
[facility] [address]  [place], [place], [address] Phone: [phone]   Fax:  [phone]  Physical Therapy Treatment  Patient Details  Name: Tina [name] MRN: [id number] Date of Birth: 1946/02/06 Referring Provider (PT): Roma Schanz, [place]   Encounter Date: 07/17/2021   PT End of Session - 07/17/21 1112     Visit Number 5    Number of Visits 13    Date for PT Re-Evaluation 08/14/21    Authorization Type U[employer] Medicare    PT Start Time 0355    PT Stop Time 1058    PT Time Calculation (min) 42 min    Activity Tolerance Patient tolerated treatment well    Behavior During Therapy [facility] for tasks assessed/performed             Past Medical History:  Diagnosis Date   Allergy    seasonal   Cataract    removed both eyes   Fatty liver    Goiter    treated   HTN (hypertension)    Hyperlipidemia    Myocardial infarct ([place]) hx of 2004   NSTEMI with stent   Osteoporosis    Patellar fracture    Sleep apnea    no cpap   Wrist fracture    right wrist    Past Surgical History:  Procedure Laterality Date   CARDIAC CATHETERIZATION  2004   stent   CATARACT EXTRACTION, BILATERAL  05/2020   COLONOSCOPY     POLYPECTOMY     PROCTOSCOPY N/A 07/12/2019   Procedure: RIGID PROCTOSCOPY;  Surgeon: Michael Boston, MD;  Location: [facility];  Service: General;  Laterality: N/A;   sleep study  2012    There were no vitals filed for this visit.   Subjective Assessment - 07/17/21 1017     Subjective "[name] noticed that getting up from the floor is easier now."    Diagnostic tests 06/07/21 - lumbar MRI: 1. Symptomatic level favored to be L3-L4 where there is a cephalad disc extrusion into the right neural foramen. Query Right L3 radiculitis. Up to 9 mm sequestered disc fragment.   2. Generally mild for age lumbar spine degeneration otherwise. No lumbar spinal stenosis. Other mild neural foraminal stenosis, up to moderate  at left L5 nerve level.    Patient Stated Goals "to work on the weakness in my legs so I can go up and down stairs normally"    Currently in Pain? No/denies                               [facility] Adult PT Treatment/Exercise - 07/17/21 0001       High Level Balance   High Level Balance Comments stepping fwd with bil arm raise 20 reps R/L      Exercises   Exercises Knee/Hip      Knee/Hip Exercises: Aerobic   Nustep L6x69min      Knee/Hip Exercises: Standing   Heel Raises Both;10 reps    Heel Raises Limitations on airex    Hip Abduction Stengthening;Both;10 reps;Knee straight    Abduction Limitations one LE on balance disk, ABD with opposite leg    Hip Extension Stengthening;Both;10 reps;Knee straight    Extension Limitations one LE on balance disk, ext with opposite leg    Forward Step Up Both;10 reps;Hand Hold: 2;Step Height: 6"    SLS R 3x8 second hold, 1 set  with UE support, last 2 sets w/o UE support      Knee/Hip Exercises: Seated   Long Arc Quad Strengthening;Both;10 reps    Long Arc Quad Limitations with ball squeeze    Ball Squeeze 10x3"    Sit to [employer] 10 reps;with UE support   cues to avoid hip ER, L LE bias shown, cues to increase fwd WS                      PT Short Term Goals - 07/11/21 1100       PT SHORT TERM GOAL #1   Title Patient will be independent in initial HEP to improve strength/mobility for better functional independence with ADLs    Status Achieved   07/11/21     PT SHORT TERM GOAL #2   Title Complete balance assessment and update LTGs as indicated    Status Achieved   07/11/21     PT SHORT TERM GOAL #3   Title Patient will verbalize understanding of fall prevention measures in home to reduce risk for falls    Status On-going    Target Date 07/24/21      PT SHORT TERM GOAL #4   Title Patient will verbalize/demonstrate good awareness of neutral spine posture and proper body mechanics for daily tasks    Status  On-going    Target Date 07/24/21               PT Long Term Goals - 07/15/21 1133       PT LONG TERM GOAL #1   Title Patient will be independent with ongoing/advanced HEP for improved strength, balance, transfers and gait    Status On-going    Target Date 08/14/21      PT LONG TERM GOAL #2   Title Patient will demonstrate improved B LE strength to >/= 4/5 to 4+/5 for improved stability and ease of mobility    Status On-going    Target Date 08/14/21      PT LONG TERM GOAL #3   Title Patient able to ascend/descend stairs with reciprocal gait and single rail support with good mechanics    Status On-going    Target Date 08/14/21      PT LONG TERM GOAL #4   Title Patient will report increased ease of transfers to/from floor for safe mobility within home    Status On-going    Target Date 08/14/21      PT LONG TERM GOAL #5   Title Patient to report ability to perform ADLs, household tasks and leisure activities without limitation due to weakness or LE instability    Status On-going    Target Date 08/14/21      PT LONG TERM GOAL #6   Title Patient will improve 5x STS time to </= 12.6 seconds w/o UE assist to demonstrated improved functional strength and transfer efficiency per age-appropriate norms    Status New    Target Date 08/14/21      PT LONG TERM GOAL #7   Title Patient will improve Berg score to >/= 54/56 to improve safety and stability with ADLs in standing and reduce risk for falls    Status On-going    Target Date 08/14/21      PT LONG TERM GOAL #8   Title Patient will improve FGA score to >/= 27/30 to improve gait stability and reduce risk for falls    Status On-going    Target Date 08/14/21  Plan - 07/17/21 1112     Clinical Impression Statement Pt was w/o any complaints with the progression of exercises today. We did incorporate more balance with hip strengthening today. With SLS on balance disk, cues were needed to prevent L  knee hyperextension. She still needs feedback to avoid R genu valgus with squating motions. STS form has improved but still seeing obvious L LE bias and requires cues to increase fwd WS for ease of movement.    Personal Factors and Comorbidities Age;Comorbidity 3+;Past/Current Experience;Time since onset of injury/illness/exacerbation    Comorbidities Lumbar stenosis, HTN, MI, sinus bradycardia, osteoporosis, B knee trauma d/t falls    PT Frequency 2x / week    PT Duration 6 weeks    PT Treatment/Interventions ADLs/Self Care Home Management;Cryotherapy;Electrical Stimulation;Iontophoresis 4mg /ml Dexamethasone;Moist Heat;Gait training;Stair training;Functional mobility training;Therapeutic activities;Therapeutic exercise;Balance training;Neuromuscular re-education;Manual techniques;Passive range of motion;Dry needling;Taping;Spinal Manipulations    PT Next Visit Plan progress lumbopelvic and proximal LE strengthening; posture and body mechanics education; fall risk prevention education    PT Home Exercise Plan Access Code: [email or web address] ([email or web address])    Consulted and Agree with Plan of Care Patient             Patient will benefit from skilled therapeutic intervention in order to improve the following deficits and impairments:  Decreased activity tolerance, Decreased balance, Decreased endurance, Decreased mobility, Decreased range of motion, Decreased strength, Difficulty walking, Impaired perceived functional ability, Impaired flexibility, Improper body mechanics, Postural dysfunction, Pain  Visit Diagnosis: Muscle weakness (generalized)  Other abnormalities of gait and mobility  Unsteadiness on feet     Problem List Patient Active Problem List   Diagnosis Date Noted   Spinal stenosis of lumbar region 06/19/2021   Low back pain with radiation 06/05/2021   Right leg weakness 06/05/2021   Primary hypertension 06/05/2021   Pain in left shin 01/01/2020   Adenomatous polyp s/p LAR rectosigmoid  resection 07/12/2019 05/15/2019   Age-related osteoporosis without current pathological fracture 04/06/2019   Sinus bradycardia 02/24/2017   Post-menopausal bleeding 10/28/2016   Left shoulder pain 01/22/2016   Right wrist injury 03/18/2015   Obesity (BMI 30-39.9) 05/29/2013   Otitis externa 09/30/2010   CONJUNCTIVITIS, BACTERIAL 12/16/2009   SINUSITIS- ACUTE-NOS 08/01/2009   TOBACCO ABUSE, HX OF 07/22/2009   OTITIS EXTERNA, ACUTE, RIGHT 02/08/2009   Goiter 02/10/2007   Hyperlipidemia 02/10/2007   Essential hypertension 02/10/2007   MYOCARDIAL INFARCTION, HX OF 02/10/2007   DIZZINESS 02/10/2007   EDEMA LEG 02/10/2007   FATTY LIVER DISEASE, HX OF 02/10/2007    Artist Pais, PTA 07/17/2021, 11:15 AM  [facility] [address]  [place], [place], [address] Phone: [phone]   Fax:  [phone]  Name: STASHIA SIA MRN: [id number] Date of Birth: [birth year]

## 2021-07-22 ENCOUNTER — Ambulatory Visit: Payer: Medicare Other

## 2021-07-22 ENCOUNTER — Other Ambulatory Visit: Payer: Self-pay

## 2021-07-22 DIAGNOSIS — R2689 Other abnormalities of gait and mobility: Secondary | ICD-10-CM | POA: Diagnosis not present

## 2021-07-22 DIAGNOSIS — R2681 Unsteadiness on feet: Secondary | ICD-10-CM

## 2021-07-22 DIAGNOSIS — M48061 Spinal stenosis, lumbar region without neurogenic claudication: Secondary | ICD-10-CM | POA: Diagnosis not present

## 2021-07-22 DIAGNOSIS — M6281 Muscle weakness (generalized): Secondary | ICD-10-CM | POA: Diagnosis not present

## 2021-07-22 NOTE — Therapy (Signed)
[facility] [address]  [place], [place], [address] Phone: [phone]   Fax:  [phone]  Physical Therapy Treatment  Patient Details  Name: Tina [name] MRN: [id number] Date of Birth: 1945/07/19 Referring Provider (PT): Roma Schanz, [place]   Encounter Date: 07/22/2021   PT End of Session - 07/22/21 1058     Visit Number 6    Number of Visits 13    Date for PT Re-Evaluation 08/14/21    Authorization Type [employer] Medicare    PT Start Time 1016    PT Stop Time 1100    PT Time Calculation (min) 44 min    Activity Tolerance Patient tolerated treatment well    Behavior During Therapy [facility] for tasks assessed/performed             Past Medical History:  Diagnosis Date   Allergy    seasonal   Cataract    removed both eyes   Fatty liver    Goiter    treated   HTN (hypertension)    Hyperlipidemia    Myocardial infarct ([place]) hx of 2004   NSTEMI with stent   Osteoporosis    Patellar fracture    Sleep apnea    no cpap   Wrist fracture    right wrist    Past Surgical History:  Procedure Laterality Date   CARDIAC CATHETERIZATION  2004   stent   CATARACT EXTRACTION, BILATERAL  05/2020   COLONOSCOPY     POLYPECTOMY     PROCTOSCOPY N/A 07/12/2019   Procedure: RIGID PROCTOSCOPY;  Surgeon: Michael Boston, MD;  Location: [facility];  Service: General;  Laterality: N/A;   sleep study  2012    There were no vitals filed for this visit.   Subjective Assessment - 07/22/21 1018     Subjective "I have been practicing steps at home and feel so much better."    Diagnostic tests 06/07/21 - lumbar MRI: 1. Symptomatic level favored to be L3-L4 where there is a cephalad disc extrusion into the right neural foramen. Query Right L3 radiculitis. Up to 9 mm sequestered disc fragment.   2. Generally mild for age lumbar spine degeneration otherwise. No lumbar spinal stenosis. Other mild neural foraminal stenosis, up to  moderate at left L5 nerve level.    Patient Stated Goals "to work on the weakness in my legs so I can go up and down stairs normally"    Currently in Pain? No/denies                               [facility] Adult PT Treatment/Exercise - 07/22/21 0001       Exercises   Exercises Knee/Hip      Knee/Hip Exercises: Aerobic   Nustep L6x52min      Knee/Hip Exercises: Standing   Hip Abduction Stengthening;Both;10 reps;Knee straight    Abduction Limitations one LE on balance disk, ABD with opposite leg    Hip Extension Stengthening;Both;10 reps;Knee straight;2 sets    Extension Limitations one LE on balance disk, ext with opposite leg   1 additional set of 10 w/o UE support   SLS R/L ~15 sec, 2 sets with intermittent UE support      Knee/Hip Exercises: Supine   Short Arc Quad Sets Both;2 sets;10 reps;Strengthening    Short Arc Quad Sets Limitations 3lb, with hip flexon    Straight Leg Raises Strengthening;Both;10  reps                     PT Education - 07/22/21 1035     Education Details edu on posture and body mechanics, fall prevention with handouts given    Person(s) Educated Patient    Methods Explanation;Handout    Comprehension Verbalized understanding              PT Short Term Goals - 07/11/21 1100       PT SHORT TERM GOAL #1   Title Patient will be independent in initial HEP to improve strength/mobility for better functional independence with ADLs    Status Achieved   07/11/21     PT SHORT TERM GOAL #2   Title Complete balance assessment and update LTGs as indicated    Status Achieved   07/11/21     PT SHORT TERM GOAL #3   Title Patient will verbalize understanding of fall prevention measures in home to reduce risk for falls    Status On-going    Target Date 07/24/21      PT SHORT TERM GOAL #4   Title Patient will verbalize/demonstrate good awareness of neutral spine posture and proper body mechanics for daily tasks    Status  On-going    Target Date 07/24/21               PT Long Term Goals - 07/15/21 1133       PT LONG TERM GOAL #1   Title Patient will be independent with ongoing/advanced HEP for improved strength, balance, transfers and gait    Status On-going    Target Date 08/14/21      PT LONG TERM GOAL #2   Title Patient will demonstrate improved B LE strength to >/= 4/5 to 4+/5 for improved stability and ease of mobility    Status On-going    Target Date 08/14/21      PT LONG TERM GOAL #3   Title Patient able to ascend/descend stairs with reciprocal gait and single rail support with good mechanics    Status On-going    Target Date 08/14/21      PT LONG TERM GOAL #4   Title Patient will report increased ease of transfers to/from floor for safe mobility within home    Status On-going    Target Date 08/14/21      PT LONG TERM GOAL #5   Title Patient to report ability to perform ADLs, household tasks and leisure activities without limitation due to weakness or LE instability    Status On-going    Target Date 08/14/21      PT LONG TERM GOAL #6   Title Patient will improve 5x STS time to </= 12.6 seconds w/o UE assist to demonstrated improved functional strength and transfer efficiency per age-appropriate norms    Status New    Target Date 08/14/21      PT LONG TERM GOAL #7   Title Patient will improve Berg score to >/= 54/56 to improve safety and stability with ADLs in standing and reduce risk for falls    Status On-going    Target Date 08/14/21      PT LONG TERM GOAL #8   Title Patient will improve FGA score to >/= 27/30 to improve gait stability and reduce risk for falls    Status On-going    Target Date 08/14/21  Plan - 07/22/21 1100     Clinical Impression Statement Pt responded well to the interventions today. Briefly reviewed posture and body mechanics and fall prevention handouts. Worked on balance today and progressed SLS exercises w/o UE  support. Cues needed to avoid knee hyperextension with SLS for quad facilitation. Noted quad muscle and gluteal muscle fatigue post session but no complaints otherwise.    Personal Factors and Comorbidities Age;Comorbidity 3+;Past/Current Experience;Time since onset of injury/illness/exacerbation    Comorbidities Lumbar stenosis, HTN, MI, sinus bradycardia, osteoporosis, B knee trauma d/t falls    PT Frequency 2x / week    PT Duration 6 weeks    PT Treatment/Interventions ADLs/Self Care Home Management;Cryotherapy;Electrical Stimulation;Iontophoresis 4mg /ml Dexamethasone;Moist Heat;Gait training;Stair training;Functional mobility training;Therapeutic activities;Therapeutic exercise;Balance training;Neuromuscular re-education;Manual techniques;Passive range of motion;Dry needling;Taping;Spinal Manipulations    PT Next Visit Plan review of posture/body mechanics, fall prevention if needed; check remaining STGs; progress lumbopelvic and proximal LE strengthening;    PT Home Exercise Plan Access Code: [email or web address] ([email or web address])    Consulted and Agree with Plan of Care Patient             Patient will benefit from skilled therapeutic intervention in order to improve the following deficits and impairments:  Decreased activity tolerance, Decreased balance, Decreased endurance, Decreased mobility, Decreased range of motion, Decreased strength, Difficulty walking, Impaired perceived functional ability, Impaired flexibility, Improper body mechanics, Postural dysfunction, Pain  Visit Diagnosis: Muscle weakness (generalized)  Other abnormalities of gait and mobility  Unsteadiness on feet     Problem List Patient Active Problem List   Diagnosis Date Noted   Spinal stenosis of lumbar region 06/19/2021   Low back pain with radiation 06/05/2021   Right leg weakness 06/05/2021   Primary hypertension 06/05/2021   Pain in left shin 01/01/2020   Adenomatous polyp s/p LAR rectosigmoid resection 07/12/2019  05/15/2019   Age-related osteoporosis without current pathological fracture 04/06/2019   Sinus bradycardia 02/24/2017   Post-menopausal bleeding 10/28/2016   Left shoulder pain 01/22/2016   Right wrist injury 03/18/2015   Obesity (BMI 30-39.9) 05/29/2013   Otitis externa 09/30/2010   CONJUNCTIVITIS, BACTERIAL 12/16/2009   SINUSITIS- ACUTE-NOS 08/01/2009   TOBACCO ABUSE, HX OF 07/22/2009   OTITIS EXTERNA, ACUTE, RIGHT 02/08/2009   Goiter 02/10/2007   Hyperlipidemia 02/10/2007   Essential hypertension 02/10/2007   MYOCARDIAL INFARCTION, HX OF 02/10/2007   DIZZINESS 02/10/2007   EDEMA LEG 02/10/2007   FATTY LIVER DISEASE, HX OF 02/10/2007    Artist Pais, PTA 07/22/2021, 11:53 AM  [facility] [address]  [place], [place], [address] Phone: [phone]   Fax:  [phone]  Name: Tina [name] MRN: [id number] Date of Birth: Jun 22, 1945

## 2021-07-24 ENCOUNTER — Encounter: Payer: Self-pay | Admitting: Physical Therapy

## 2021-07-24 ENCOUNTER — Other Ambulatory Visit: Payer: Self-pay

## 2021-07-24 ENCOUNTER — Ambulatory Visit: Payer: Medicare Other | Admitting: Physical Therapy

## 2021-07-24 DIAGNOSIS — M6281 Muscle weakness (generalized): Secondary | ICD-10-CM | POA: Diagnosis not present

## 2021-07-24 DIAGNOSIS — R2681 Unsteadiness on feet: Secondary | ICD-10-CM

## 2021-07-24 DIAGNOSIS — M48061 Spinal stenosis, lumbar region without neurogenic claudication: Secondary | ICD-10-CM | POA: Diagnosis not present

## 2021-07-24 DIAGNOSIS — R2689 Other abnormalities of gait and mobility: Secondary | ICD-10-CM | POA: Diagnosis not present

## 2021-07-24 NOTE — Patient Instructions (Signed)
° ° ° ° °  Access Code: [email or web address] URL: [email or web address] Date: 07/24/2021 Prepared by: Annie Paras  Exercises Hooklying Hamstring Stretch with Strap - 2 x daily - 7 x weekly - 3 reps - 30 sec hold Supine Figure 4 Piriformis Stretch - 2 x daily - 7 x weekly - 3 reps - 30 sec hold Supine Piriformis Stretch with Foot on Ground - 2 x daily - 7 x weekly - 3 reps - 30 sec hold Supine March with Resistance Band - 1 x daily - 5 x weekly - 2 sets - 10 reps - 2-3 sec hold hold Bridge with Resistance - 1 x daily - 5 x weekly - 2 sets - 10 reps - 5 sec hold Clamshell with Resistance - 1 x daily - 7 x weekly - 2 sets - 10 reps - 3 second hold Standing Hip Flexion with Anchored Resistance and Chair Support - 1 x daily - 7 x weekly - 2 sets - 10 reps - 3 sec hold Standing Hip Adduction with Anchored Resistance - 1 x daily - 7 x weekly - 2 sets - 10 reps - 3 sec hold Standing Hip Extension with Anchored Resistance - 1 x daily - 7 x weekly - 2 sets - 10 reps - 3 sec hold Standing Hip Abduction with Anchored Resistance - 1 x daily - 7 x weekly - 2 sets - 10 reps - 3 sec hold

## 2021-07-24 NOTE — Therapy (Signed)
[facility] [address]  [place], [place], [address] Phone: [phone]   Fax:  [phone]  Physical Therapy Treatment / Progress Note  Patient Details  Name: Tina [name] MRN: [id number] Date of Birth: July 07, 1945 Referring Provider (PT): Roma Schanz, DO  Progress Note  Reporting Period 07/03/2021 to 07/24/2021  See note below for Objective Data and Assessment of Progress/Goals.     Encounter Date: 07/24/2021   PT End of Session - 07/24/21 1018     Visit Number 7    Number of Visits 13    Date for PT Re-Evaluation 08/14/21    Authorization Type [employer] Medicare    PT Start Time 1018    PT Stop Time 1100    PT Time Calculation (min) 42 min    Activity Tolerance Patient tolerated treatment well    Behavior During Therapy WFL for tasks assessed/performed             Past Medical History:  Diagnosis Date   Allergy    seasonal   Cataract    removed both eyes   Fatty liver    Goiter    treated   HTN (hypertension)    Hyperlipidemia    Myocardial infarct ([place]) hx of 2004   NSTEMI with stent   Osteoporosis    Patellar fracture    Sleep apnea    no cpap   Wrist fracture    right wrist    Past Surgical History:  Procedure Laterality Date   CARDIAC CATHETERIZATION  2004   stent   CATARACT EXTRACTION, BILATERAL  05/2020   COLONOSCOPY     POLYPECTOMY     PROCTOSCOPY N/A 07/12/2019   Procedure: RIGID PROCTOSCOPY;  Surgeon: Michael Boston, MD;  Location: [facility];  Service: General;  Laterality: N/A;   sleep study  2012    There were no vitals filed for this visit.   Subjective Assessment - 07/24/21 1022     Subjective "I think I'm improving, but the biggest thing that's still a problem is my balance."    Diagnostic tests 06/07/21 - lumbar MRI: 1. Symptomatic level favored to be L3-L4 where there is a cephalad disc extrusion into the right neural foramen. Query Right L3 radiculitis. Up to 9 mm  sequestered disc fragment.   2. Generally mild for age lumbar spine degeneration otherwise. No lumbar spinal stenosis. Other mild neural foraminal stenosis, up to moderate at left L5 nerve level.    Patient Stated Goals "to work on the weakness in my legs so I can go up and down stairs normally"    Currently in Pain? No/denies                [facility] PT Assessment - 07/24/21 1018       Assessment   Medical Diagnosis Lumbar spinal stenosis    Referring Provider (PT) Roma Schanz, DO    Onset Date/Surgical Date --   Dec 2022   Next MD Visit 12/18/21 - routine bloodwork f/u in 6 months      Strength   Right Hip Flexion 4-/5    Right Hip Extension 4+/5    Right Hip External Rotation  4-/5    Right Hip Internal Rotation 4+/5    Right Hip ABduction 4/5    Right Hip ADduction 3+/5    Left Hip Flexion 4/5    Left Hip Extension 4+/5    Left Hip External Rotation 4-/5  Left Hip Internal Rotation 4+/5    Left Hip ABduction 4/5    Left Hip ADduction 4-/5    Right Knee Flexion 5/5    Right Knee Extension 5/5    Left Knee Flexion 5/5    Left Knee Extension 5/5    Right Ankle Dorsiflexion 5/5    Left Ankle Dorsiflexion 5/5                           [facility] Adult PT Treatment/Exercise - 07/24/21 1018       Lumbar Exercises: Supine   Bent Knee Raise 10 reps;5 seconds    Bent Knee Raise Limitations green TB brace marching    Bridge with clamshell 10 reps;5 seconds    Bridge with [employer] Limitations green TB      Knee/Hip Exercises: Aerobic   Nustep L7 x 6 min      Knee/Hip Exercises: Standing   Hip Flexion Right;Left;10 reps;Knee straight;Stengthening    Hip Flexion Limitations red TB at ankle; light UE support on back of chair   SLS + 4-way hip   Hip ADduction Right;Left    Hip ADduction Limitations red TB at ankle; light UE support on back of chair    Hip Abduction Right;Left;10 reps;Knee straight;Stengthening    Abduction Limitations red TB at  ankle; light UE support on back of chair    Hip Extension Right;Left;10 reps;Knee straight;Stengthening    Extension Limitations red TB at ankle; light UE support on back of chair                       PT Short Term Goals - 07/24/21 1024       PT SHORT TERM GOAL #1   Title Patient will be independent in initial HEP to improve strength/mobility for better functional independence with ADLs    Status Achieved   07/11/21     PT SHORT TERM GOAL #2   Title Complete balance assessment and update LTGs as indicated    Status Achieved   07/11/21     PT SHORT TERM GOAL #3   Title Patient will verbalize understanding of fall prevention measures in home to reduce risk for falls    Status Achieved   07/24/21     PT SHORT TERM GOAL #4   Title Patient will verbalize/demonstrate good awareness of neutral spine posture and proper body mechanics for daily tasks    Status Achieved   07/24/21              PT Long Term Goals - 07/24/21 1032       PT LONG TERM GOAL #1   Title Patient will be independent with ongoing/advanced HEP for improved strength, balance, transfers and gait    Status Partially Met   07/24/21 - met for current HEP; update provided today   Target Date 08/14/21      PT LONG TERM GOAL #2   Title Patient will demonstrate improved B LE strength to >/= 4/5 to 4+/5 for improved stability and ease of mobility    Status Partially Met    Target Date 08/14/21      PT LONG TERM GOAL #3   Title Patient able to ascend/descend stairs with reciprocal gait and single rail support with good mechanics    Status On-going    Target Date 08/14/21      PT LONG TERM GOAL #4   Title Patient will report  increased ease of transfers to/from floor for safe mobility within home    Status On-going    Target Date 08/14/21      PT LONG TERM GOAL #5   Title Patient to report ability to perform ADLs, household tasks and leisure activities without limitation due to weakness or LE  instability    Status On-going    Target Date 08/14/21      PT LONG TERM GOAL #6   Title Patient will improve 5x STS time to </= 12.6 seconds w/o UE assist to demonstrated improved functional strength and transfer efficiency per age-appropriate norms    Status On-going    Target Date 08/14/21      PT LONG TERM GOAL #7   Title Patient will improve Berg score to >/= 54/56 to improve safety and stability with ADLs in standing and reduce risk for falls    Status On-going    Target Date 08/14/21      PT LONG TERM GOAL #8   Title Patient will improve FGA score to >/= 27/30 to improve gait stability and reduce risk for falls    Status On-going    Target Date 08/14/21                   Plan - 07/24/21 1100     Clinical Impression Statement Tina [name] reports good understanding of the posture and body mechanics education and denies questions with the fall safety checklist provided last visit - remaining STGs met. She feels that she is progressing with PT but still feels limited with her balance. Strength reassessed today with gains noted in majority of LE muscle groups and knees and ankle DF now 5/5, but some proximal LE weakness still evident. Progressed LE strengthening to include standing 4-way red TB resisted SLR to promote further hip strengthening while adding balance component in SLS - pt able to perform good return demonstration with light UE support on back of chair for balance - HEP update provided accordingly. Tina [name] is progressing well with PT with all STGs now met and progress observed toward LTGs. She will continue to benefit from skilled PT for further proximal LE strengthening along with balance training to increase ease of mobility and promote improved safety and stability with daily tasks.    Comorbidities Lumbar stenosis, HTN, MI, sinus bradycardia, osteoporosis, B knee trauma d/t falls    PT Frequency 2x / week    PT Duration 6 weeks    PT Treatment/Interventions ADLs/Self  Care Home Management;Cryotherapy;Electrical Stimulation;Iontophoresis 7m/ml Dexamethasone;Moist Heat;Gait training;Stair training;Functional mobility training;Therapeutic activities;Therapeutic exercise;Balance training;Neuromuscular re-education;Manual techniques;Passive range of motion;Dry needling;Taping;Spinal Manipulations    PT Next Visit Plan progress lumbopelvic and proximal LE strengthening; balance training    PT Home Exercise Plan Access Code: [email or web address] (2/2)    Consulted and Agree with Plan of Care Patient             Patient will benefit from skilled therapeutic intervention in order to improve the following deficits and impairments:  Decreased activity tolerance, Decreased balance, Decreased endurance, Decreased mobility, Decreased range of motion, Decreased strength, Difficulty walking, Impaired perceived functional ability, Impaired flexibility, Improper body mechanics, Postural dysfunction, Pain  Visit Diagnosis: Muscle weakness (generalized)  Other abnormalities of gait and mobility  Unsteadiness on feet     Problem List Patient Active Problem List   Diagnosis Date Noted   Spinal stenosis of lumbar region 06/19/2021   Low back pain with radiation 06/05/2021   Right leg weakness 06/05/2021  Primary hypertension 06/05/2021   Pain in left shin 01/01/2020   Adenomatous polyp s/p LAR rectosigmoid resection 07/12/2019 05/15/2019   Age-related osteoporosis without current pathological fracture 04/06/2019   Sinus bradycardia 02/24/2017   Post-menopausal bleeding 10/28/2016   Left shoulder pain 01/22/2016   Right wrist injury 03/18/2015   Obesity (BMI 30-39.9) 05/29/2013   Otitis externa 09/30/2010   CONJUNCTIVITIS, BACTERIAL 12/16/2009   SINUSITIS- ACUTE-NOS 08/01/2009   TOBACCO ABUSE, HX OF 07/22/2009   OTITIS EXTERNA, ACUTE, RIGHT 02/08/2009   Goiter 02/10/2007   Hyperlipidemia 02/10/2007   Essential hypertension 02/10/2007   MYOCARDIAL INFARCTION, HX OF  02/10/2007   DIZZINESS 02/10/2007   EDEMA LEG 02/10/2007   FATTY LIVER DISEASE, HX OF 02/10/2007    Percival Spanish, PT 07/24/2021, 1:43 PM  [facility] [address]  [place], [place], [address] Phone: [phone]   Fax:  [phone]  Name: Tina [name] MRN: [id number] Date of Birth: [birth year]

## 2021-07-28 NOTE — Telephone Encounter (Signed)
Prior auth required for [facility]  PA PROCESS DETAILS: Effective 06/01/2021 if the patient is new to [facility], Prior authorization and Step Therapy are required & not on file. Please go to [email or web address] or call [phone] to initiate the prior authorization. For exception to the policy please visit [email or web address] and review Policy Number [email or web address] 

## 2021-07-29 ENCOUNTER — Other Ambulatory Visit: Payer: Self-pay

## 2021-07-29 ENCOUNTER — Ambulatory Visit: Payer: Medicare Other

## 2021-07-29 DIAGNOSIS — R2689 Other abnormalities of gait and mobility: Secondary | ICD-10-CM | POA: Diagnosis not present

## 2021-07-29 DIAGNOSIS — R2681 Unsteadiness on feet: Secondary | ICD-10-CM | POA: Diagnosis not present

## 2021-07-29 DIAGNOSIS — M48061 Spinal stenosis, lumbar region without neurogenic claudication: Secondary | ICD-10-CM | POA: Diagnosis not present

## 2021-07-29 DIAGNOSIS — M6281 Muscle weakness (generalized): Secondary | ICD-10-CM

## 2021-07-29 NOTE — Therapy (Signed)
[facility] [address]  [place], [place], [address] Phone: [phone]   Fax:  [phone]  Physical Therapy Treatment  Patient Details  Name: Tina [name] MRN: [id number] Date of Birth: 1946/03/11 Referring Provider (PT): Roma Schanz, [place]   Encounter Date: 07/29/2021   PT End of Session - 07/29/21 1022     Visit Number 8    Number of Visits 13    Date for PT Re-Evaluation 08/14/21    Authorization Type [employer]HC Medicare    Progress Note Due on Visit 13   PN completed on visit #7 - 07/24/21   PT Start Time 0933    PT Stop Time 1011    PT Time Calculation (min) 38 min    Activity Tolerance Patient tolerated treatment well    Behavior During Therapy [facility] for tasks assessed/performed             Past Medical History:  Diagnosis Date   Allergy    seasonal   Cataract    removed both eyes   Fatty liver    Goiter    treated   HTN (hypertension)    Hyperlipidemia    Myocardial infarct ([place]) hx of 2004   NSTEMI with stent   Osteoporosis    Patellar fracture    Sleep apnea    no cpap   Wrist fracture    right wrist    Past Surgical History:  Procedure Laterality Date   CARDIAC CATHETERIZATION  2004   stent   CATARACT EXTRACTION, BILATERAL  05/2020   COLONOSCOPY     POLYPECTOMY     PROCTOSCOPY N/A 07/12/2019   Procedure: RIGID PROCTOSCOPY;  Surgeon: Michael Boston, MD;  Location: [facility];  Service: General;  Laterality: N/A;   sleep study  2012    There were no vitals filed for this visit.   Subjective Assessment - 07/29/21 0934     Subjective My balance is improving, still shaky when standing on one leg.    Diagnostic tests 06/07/21 - lumbar MRI: 1. Symptomatic level favored to be L3-L4 where there is a cephalad disc extrusion into the right neural foramen. Query Right L3 radiculitis. Up to 9 mm sequestered disc fragment.   2. Generally mild for age lumbar spine degeneration otherwise. No lumbar  spinal stenosis. Other mild neural foraminal stenosis, up to moderate at left L5 nerve level.    Patient Stated Goals "to work on the weakness in my legs so I can go up and down stairs normally"    Currently in Pain? No/denies                               [facility] Adult PT Treatment/Exercise - 07/29/21 0001       Knee/Hip Exercises: Aerobic   Nustep L7 x 6 min      Knee/Hip Exercises: Standing   Hip Flexion Right;Left;10 reps;Knee straight;Stengthening    Hip Flexion Limitations red TB at ankle; light UE support    Hip Abduction Right;Left;10 reps;Knee straight;Stengthening    Abduction Limitations red TB at ankle; light UE support    Hip Extension Right;Left;10 reps;Knee straight;Stengthening    Extension Limitations red TB at ankle; light UE support    Other Standing Knee Exercises sidesteps 4x25 ft with red TB      Knee/Hip Exercises: Seated   Sit to Sand 10 reps   red TB at  knees to avoid genu valgus                      PT Short Term Goals - 07/24/21 1024       PT SHORT TERM GOAL #1   Title Patient will be independent in initial HEP to improve strength/mobility for better functional independence with ADLs    Status Achieved   07/11/21     PT SHORT TERM GOAL #2   Title Complete balance assessment and update LTGs as indicated    Status Achieved   07/11/21     PT SHORT TERM GOAL #3   Title Patient will verbalize understanding of fall prevention measures in home to reduce risk for falls    Status Achieved   07/24/21     PT SHORT TERM GOAL #4   Title Patient will verbalize/demonstrate good awareness of neutral spine posture and proper body mechanics for daily tasks    Status Achieved   07/24/21              PT Long Term Goals - 07/24/21 1032       PT LONG TERM GOAL #1   Title Patient will be independent with ongoing/advanced HEP for improved strength, balance, transfers and gait    Status Partially Met   07/24/21 - met for current  HEP; update provided today   Target Date 08/14/21      PT LONG TERM GOAL #2   Title Patient will demonstrate improved B LE strength to >/= 4/5 to 4+/5 for improved stability and ease of mobility    Status Partially Met    Target Date 08/14/21      PT LONG TERM GOAL #3   Title Patient able to ascend/descend stairs with reciprocal gait and single rail support with good mechanics    Status On-going    Target Date 08/14/21      PT LONG TERM GOAL #4   Title Patient will report increased ease of transfers to/from floor for safe mobility within home    Status On-going    Target Date 08/14/21      PT LONG TERM GOAL #5   Title Patient to report ability to perform ADLs, household tasks and leisure activities without limitation due to weakness or LE instability    Status On-going    Target Date 08/14/21      PT LONG TERM GOAL #6   Title Patient will improve 5x STS time to </= 12.6 seconds w/o UE assist to demonstrated improved functional strength and transfer efficiency per age-appropriate norms    Status On-going    Target Date 08/14/21      PT LONG TERM GOAL #7   Title Patient will improve Berg score to >/= 54/56 to improve safety and stability with ADLs in standing and reduce risk for falls    Status On-going    Target Date 08/14/21      PT LONG TERM GOAL #8   Title Patient will improve FGA score to >/= 27/30 to improve gait stability and reduce risk for falls    Status On-going    Target Date 08/14/21                   Plan - 07/29/21 1023     Clinical Impression Statement Today was a strength focused session. Pt required cues during STS to avoid genu valgus. Cues needed with sidesteps to keep toes and torso pointed fwd. She was able to  complete all exercises/interventions today, only with reports of muscle fatigue after exercises. Maybe emphasize more glute med strengthening going forward.    Personal Factors and Comorbidities Age;Comorbidity 3+;Past/Current  Experience;Time since onset of injury/illness/exacerbation    Comorbidities Lumbar stenosis, HTN, MI, sinus bradycardia, osteoporosis, B knee trauma d/t falls    PT Frequency 2x / week    PT Duration 6 weeks    PT Treatment/Interventions ADLs/Self Care Home Management;Cryotherapy;Electrical Stimulation;Iontophoresis 18m/ml Dexamethasone;Moist Heat;Gait training;Stair training;Functional mobility training;Therapeutic activities;Therapeutic exercise;Balance training;Neuromuscular re-education;Manual techniques;Passive range of motion;Dry needling;Taping;Spinal Manipulations    PT Next Visit Plan progress lumbopelvic and proximal LE strengthening; balance training    PT Home Exercise Plan Access Code: [email or web address](2/2)    Consulted and Agree with Plan of Care Patient             Patient will benefit from skilled therapeutic intervention in order to improve the following deficits and impairments:  Decreased activity tolerance, Decreased balance, Decreased endurance, Decreased mobility, Decreased range of motion, Decreased strength, Difficulty walking, Impaired perceived functional ability, Impaired flexibility, Improper body mechanics, Postural dysfunction, Pain  Visit Diagnosis: Muscle weakness (generalized)  Other abnormalities of gait and mobility  Unsteadiness on feet     Problem List Patient Active Problem List   Diagnosis Date Noted   Spinal stenosis of lumbar region 06/19/2021   Low back pain with radiation 06/05/2021   Right leg weakness 06/05/2021   Primary hypertension 06/05/2021   Pain in left shin 01/01/2020   Adenomatous polyp s/p LAR rectosigmoid resection 07/12/2019 05/15/2019   Age-related osteoporosis without current pathological fracture 04/06/2019   Sinus bradycardia 02/24/2017   Post-menopausal bleeding 10/28/2016   Left shoulder pain 01/22/2016   Right wrist injury 03/18/2015   Obesity (BMI 30-39.9) 05/29/2013   Otitis externa 09/30/2010   CONJUNCTIVITIS,  BACTERIAL 12/16/2009   SINUSITIS- ACUTE-NOS 08/01/2009   TOBACCO ABUSE, HX OF 07/22/2009   OTITIS EXTERNA, ACUTE, RIGHT 02/08/2009   Goiter 02/10/2007   Hyperlipidemia 02/10/2007   Essential hypertension 02/10/2007   MYOCARDIAL INFARCTION, HX OF 02/10/2007   DIZZINESS 02/10/2007   EDEMA LEG 02/10/2007   FATTY LIVER DISEASE, HX OF 02/10/2007    BArtist Pais PTA 07/29/2021, 12:06 PM  [facility] [address] [place] [address]: [phone]  Fax:  [phone] Name: Tina CRAKERMRN: [id number] of Birth: [birth year]

## 2021-07-30 ENCOUNTER — Other Ambulatory Visit: Payer: Self-pay | Admitting: Family Medicine

## 2021-07-30 DIAGNOSIS — E785 Hyperlipidemia, unspecified: Secondary | ICD-10-CM

## 2021-07-31 ENCOUNTER — Ambulatory Visit: Payer: Medicare Other | Attending: Family Medicine | Admitting: Physical Therapy

## 2021-07-31 ENCOUNTER — Encounter: Payer: Self-pay | Admitting: Physical Therapy

## 2021-07-31 ENCOUNTER — Other Ambulatory Visit: Payer: Self-pay

## 2021-07-31 DIAGNOSIS — R2681 Unsteadiness on feet: Secondary | ICD-10-CM | POA: Insufficient documentation

## 2021-07-31 DIAGNOSIS — M6281 Muscle weakness (generalized): Secondary | ICD-10-CM | POA: Insufficient documentation

## 2021-07-31 DIAGNOSIS — R2689 Other abnormalities of gait and mobility: Secondary | ICD-10-CM | POA: Diagnosis not present

## 2021-07-31 NOTE — Therapy (Signed)
[facility] [address]  [place], [place], [address] Phone: [phone]   Fax:  [phone]  Physical Therapy Treatment  Patient Details  Name: Tina [name] MRN: [id number] Date of Birth: 1945/07/22 Referring Provider (PT): Roma Schanz, [place]   Encounter Date: 07/31/2021   PT End of Session - 07/31/21 1106     Visit Number 9    Number of Visits 13    Date for PT Re-Evaluation 08/14/21    Authorization Type [employer] Medicare    Progress Note Due on Visit 46    PT Start Time 1102    PT Stop Time 1144    PT Time Calculation (min) 42 min    Activity Tolerance Patient tolerated treatment well    Behavior During Therapy [facility] for tasks assessed/performed             Past Medical History:  Diagnosis Date   Allergy    seasonal   Cataract    removed both eyes   Fatty liver    Goiter    treated   HTN (hypertension)    Hyperlipidemia    Myocardial infarct ([place]) hx of 2004   NSTEMI with stent   Osteoporosis    Patellar fracture    Sleep apnea    no cpap   Wrist fracture    right wrist    Past Surgical History:  Procedure Laterality Date   CARDIAC CATHETERIZATION  2004   stent   CATARACT EXTRACTION, BILATERAL  05/2020   COLONOSCOPY     POLYPECTOMY     PROCTOSCOPY N/A 07/12/2019   Procedure: RIGID PROCTOSCOPY;  Surgeon: Michael Boston, MD;  Location: [facility];  Service: General;  Laterality: N/A;   sleep study  2012    There were no vitals filed for this visit.   Subjective Assessment - 07/31/21 1108     Subjective I'm getting stronger    Diagnostic tests 06/07/21 - lumbar MRI: 1. Symptomatic level favored to be L3-L4 where there is a cephalad disc extrusion into the right neural foramen. Query Right L3 radiculitis. Up to 9 mm sequestered disc fragment.   2. Generally mild for age lumbar spine degeneration otherwise. No lumbar spinal stenosis. Other mild neural foraminal stenosis, up to moderate at left  L5 nerve level.    Patient Stated Goals "to work on the weakness in my legs so I can go up and down stairs normally"    Currently in Pain? No/denies                               [facility] Adult PT Treatment/Exercise - 07/31/21 0001       Knee/Hip Exercises: Aerobic   Nustep L7 x 8 min      Knee/Hip Exercises: Standing   Hip Flexion Right;Left;10 reps;Knee straight;Stengthening    Hip Flexion Limitations red TB at ankle; light UE support    Hip Abduction Right;Left;10 reps;Knee straight;Stengthening    Abduction Limitations red TB at ankle; light UE support    Hip Extension Right;Left;10 reps;Knee straight;Stengthening    Extension Limitations red TB at ankle; light UE support    Lateral Step Up Both;10 reps;Hand Hold: 2;Step Height: 6"    Forward Step Up Both;10 reps;Hand Hold: 2;Step Height: 6"    Other Standing Knee Exercises sidesteps 4x10 ft with red TB      Knee/Hip Exercises: Seated   Sit to  Sand 15 reps   From blue foam on chair with red TB at knees to avoid genu valgus     Knee/Hip Exercises: Sidelying   Hip ABduction Both;10 reps    Hip ABduction Limitations RTB on thighs    Clams GTB x 10 Bil                       PT Short Term Goals - 07/24/21 1024       PT SHORT TERM GOAL #1   Title Patient will be independent in initial HEP to improve strength/mobility for better functional independence with ADLs    Status Achieved   07/11/21     PT SHORT TERM GOAL #2   Title Complete balance assessment and update LTGs as indicated    Status Achieved   07/11/21     PT SHORT TERM GOAL #3   Title Patient will verbalize understanding of fall prevention measures in home to reduce risk for falls    Status Achieved   07/24/21     PT SHORT TERM GOAL #4   Title Patient will verbalize/demonstrate good awareness of neutral spine posture and proper body mechanics for daily tasks    Status Achieved   07/24/21              PT Long Term Goals -  07/24/21 1032       PT LONG TERM GOAL #1   Title Patient will be independent with ongoing/advanced HEP for improved strength, balance, transfers and gait    Status Partially Met   07/24/21 - met for current HEP; update provided today   Target Date 08/14/21      PT LONG TERM GOAL #2   Title Patient will demonstrate improved B LE strength to >/= 4/5 to 4+/5 for improved stability and ease of mobility    Status Partially Met    Target Date 08/14/21      PT LONG TERM GOAL #3   Title Patient able to ascend/descend stairs with reciprocal gait and single rail support with good mechanics    Status On-going    Target Date 08/14/21      PT LONG TERM GOAL #4   Title Patient will report increased ease of transfers to/from floor for safe mobility within home    Status On-going    Target Date 08/14/21      PT LONG TERM GOAL #5   Title Patient to report ability to perform ADLs, household tasks and leisure activities without limitation due to weakness or LE instability    Status On-going    Target Date 08/14/21      PT LONG TERM GOAL #6   Title Patient will improve 5x STS time to </= 12.6 seconds w/o UE assist to demonstrated improved functional strength and transfer efficiency per age-appropriate norms    Status On-going    Target Date 08/14/21      PT LONG TERM GOAL #7   Title Patient will improve Berg score to >/= 54/56 to improve safety and stability with ADLs in standing and reduce risk for falls    Status On-going    Target Date 08/14/21      PT LONG TERM GOAL #8   Title Patient will improve FGA score to >/= 27/30 to improve gait stability and reduce risk for falls    Status On-going    Target Date 08/14/21  Plan - 07/31/21 1447     Clinical Impression Statement Continued to focus on gluteus med strength today with good response. She is hesitant with steps and needs encouragement to lead with R LE. She would benefit from trial of reciprocal gait on  buildiing stairs to help build confidence and strength.  Needs 10th visit progress note next visit.    PT Treatment/Interventions ADLs/Self Care Home Management;Cryotherapy;Electrical Stimulation;Iontophoresis 81m/ml Dexamethasone;Moist Heat;Gait training;Stair training;Functional mobility training;Therapeutic activities;Therapeutic exercise;Balance training;Neuromuscular re-education;Manual techniques;Passive range of motion;Dry needling;Taping;Spinal Manipulations    PT Next Visit Plan 10th visit progress note, try stairs, progress lumbopelvic and proximal LE strengthening; balance training    Consulted and Agree with Plan of Care Patient             Patient will benefit from skilled therapeutic intervention in order to improve the following deficits and impairments:  Decreased activity tolerance, Decreased balance, Decreased endurance, Decreased mobility, Decreased range of motion, Decreased strength, Difficulty walking, Impaired perceived functional ability, Impaired flexibility, Improper body mechanics, Postural dysfunction, Pain  Visit Diagnosis: Muscle weakness (generalized)  Other abnormalities of gait and mobility  Unsteadiness on feet     Problem List Patient Active Problem List   Diagnosis Date Noted   Spinal stenosis of lumbar region 06/19/2021   Low back pain with radiation 06/05/2021   Right leg weakness 06/05/2021   Primary hypertension 06/05/2021   Pain in left shin 01/01/2020   Adenomatous polyp s/p LAR rectosigmoid resection 07/12/2019 05/15/2019   Age-related osteoporosis without current pathological fracture 04/06/2019   Sinus bradycardia 02/24/2017   Post-menopausal bleeding 10/28/2016   Left shoulder pain 01/22/2016   Right wrist injury 03/18/2015   Obesity (BMI 30-39.9) 05/29/2013   Otitis externa 09/30/2010   CONJUNCTIVITIS, BACTERIAL 12/16/2009   SINUSITIS- ACUTE-NOS 08/01/2009   TOBACCO ABUSE, HX OF 07/22/2009   OTITIS EXTERNA, ACUTE, RIGHT  02/08/2009   Goiter 02/10/2007   Hyperlipidemia 02/10/2007   Essential hypertension 02/10/2007   MYOCARDIAL INFARCTION, HX OF 02/10/2007   DIZZINESS 02/10/2007   EDEMA LEG 02/10/2007   FATTY LIVER DISEASE, HX OF 02/10/2007   JMadelyn Flavors PT 07/31/2021, 2:52 PM  [facility] [place] [address]Phone: [phone]  Fax:  [phone] Name: AEMILA STEINHAUSERMRN: [id number] of Birth: 1January 07, 1947

## 2021-08-04 NOTE — Telephone Encounter (Signed)
Tina [name] called from [place] [facility] regarding prolia injection ? ?Tina [name] stated there's different steps in order for the prolia to be approved! Pt has to try other medication first. She would like for someone to contact her to discuss more information ? ?[phone] extension [phone] ? ?Please advise  ?

## 2021-08-04 NOTE — Telephone Encounter (Signed)
Prior auth pending clinical review ?PA# [id number] ? ? ?

## 2021-08-05 ENCOUNTER — Ambulatory Visit: Payer: Medicare Other | Admitting: Physical Therapy

## 2021-08-05 ENCOUNTER — Other Ambulatory Visit: Payer: Self-pay

## 2021-08-05 ENCOUNTER — Encounter: Payer: Self-pay | Admitting: Physical Therapy

## 2021-08-05 DIAGNOSIS — M6281 Muscle weakness (generalized): Secondary | ICD-10-CM

## 2021-08-05 DIAGNOSIS — R2689 Other abnormalities of gait and mobility: Secondary | ICD-10-CM | POA: Diagnosis not present

## 2021-08-05 DIAGNOSIS — R2681 Unsteadiness on feet: Secondary | ICD-10-CM

## 2021-08-05 NOTE — Therapy (Signed)
[facility] [address]  [place], [place], [address] Phone: [phone]   Fax:  [phone]  Physical Therapy Treatment and Progress Note  Patient Details  Name: Tina [name] MRN: [id number] Date of Birth: 1945-08-06 Referring Provider (PT): Garnet Koyanagi [place], [place]   Encounter Date: 08/05/2021  Physical Therapy Progress Note  Dates of Reporting Period: 07/03/21 to 08/05/21    PT End of Session - 08/05/21 0934     Visit Number 10    Number of Visits 13    Date for PT Re-Evaluation 08/14/21    Authorization Type [employer] Medicare    Progress Note Due on Visit 13    PT Start Time 0934    PT Stop Time 1017    PT Time Calculation (min) 43 min    Activity Tolerance Patient tolerated treatment well    Behavior During Therapy [facility] for tasks assessed/performed             Past Medical History:  Diagnosis Date   Allergy    seasonal   Cataract    removed both eyes   Fatty liver    Goiter    treated   HTN (hypertension)    Hyperlipidemia    Myocardial infarct ([place]) hx of 2004   NSTEMI with stent   Osteoporosis    Patellar fracture    Sleep apnea    no cpap   Wrist fracture    right wrist    Past Surgical History:  Procedure Laterality Date   CARDIAC CATHETERIZATION  2004   stent   CATARACT EXTRACTION, BILATERAL  05/2020   COLONOSCOPY     POLYPECTOMY     PROCTOSCOPY N/A 07/12/2019   Procedure: RIGID PROCTOSCOPY;  Surgeon: Michael Boston, MD;  Location: [facility];  Service: General;  Laterality: N/A;   sleep study  2012    There were no vitals filed for this visit.   Subjective Assessment - 08/05/21 0937     Subjective I'm still needing to use my arms to help with standing. Stairs are better. I feel like I can lead with either leg.    Patient Stated Goals "to work on the weakness in my legs so I can go up and down stairs normally"    Currently in Pain? No/denies                [facility] PT Assessment  - 08/05/21 0001       Strength   Right Hip Flexion 4-/5    Right Hip Extension 5/5    Right Hip External Rotation  5/5    Right Hip Internal Rotation 5/5    Right Hip ABduction 5/5    Right Hip ADduction 3+/5    Left Hip Flexion 4+/5    Left Hip Extension 4+/5    Left Hip External Rotation 5/5    Left Hip Internal Rotation 5/5    Left Hip ABduction 5/5    Left Hip ADduction 4+/5      Standardized Balance Assessment   Five times sit to stand comments  12.76   used RUE for first 2 reps                          [facility] Adult PT Treatment/Exercise - 08/05/21 0001       Ambulation/Gait   Stairs Yes    Stairs Assistance 7: Independent    Stair Management Technique One rail  Left;One rail Right;Alternating pattern    Number of Stairs --   one flight each way in building   Height of Stairs 7      Knee/Hip Exercises: Aerobic   Nustep L7 x 6 min      Knee/Hip Exercises: Standing   Hip Flexion Right;Left;2 sets;10 reps    Hip Flexion Limitations red TB at around feet/standing on band; did yellow for right to get full ROM    Hip ADduction Right;20 reps    Hip ADduction Limitations RTB toe tap diagonally in front of left      Knee/Hip Exercises: Seated   Sit to Sand 5 reps      Knee/Hip Exercises: Sidelying   Hip ADduction Right;10 reps    Hip ADduction Limitations very difficult and limited ROM in SDLY so moved to standing                     PT Education - 08/05/21 1230     Education Details HEP progressed    Person(s) Educated Patient    Methods Explanation;Demonstration;Handout;Verbal cues    Comprehension Verbalized understanding;Returned demonstration              PT Short Term Goals - 07/24/21 1024       PT SHORT TERM GOAL #1   Title Patient will be independent in initial HEP to improve strength/mobility for better functional independence with ADLs    Status Achieved   07/11/21     PT SHORT TERM GOAL #2   Title Complete balance  assessment and update LTGs as indicated    Status Achieved   07/11/21     PT SHORT TERM GOAL #3   Title Patient will verbalize understanding of fall prevention measures in home to reduce risk for falls    Status Achieved   07/24/21     PT SHORT TERM GOAL #4   Title Patient will verbalize/demonstrate good awareness of neutral spine posture and proper body mechanics for daily tasks    Status Achieved   07/24/21              PT Long Term Goals - 08/05/21 0939       PT LONG TERM GOAL #1   Title Patient will be independent with ongoing/advanced HEP for improved strength, balance, transfers and gait    Status Partially Met      PT LONG TERM GOAL #2   Title Patient will demonstrate improved B LE strength to >/= 4/5 to 4+/5 for improved stability and ease of mobility    Status Partially Met      PT LONG TERM GOAL #3   Title Patient able to ascend/descend stairs with reciprocal gait and single rail support with good mechanics    Status Achieved      PT LONG TERM GOAL #4   Title Patient will report increased ease of transfers to/from floor for safe mobility within home    Baseline still difficult but easier than it was a month ago    Status Partially Met      PT LONG TERM GOAL #5   Title Patient to report ability to perform ADLs, household tasks and leisure activities without limitation due to weakness or LE instability    Baseline able to walk the dog and vaccuum without difficulty.    Status Achieved      PT LONG TERM GOAL #6   Title Patient will improve 5x STS time to </= 12.6 seconds w/o  UE assist to demonstrated improved functional strength and transfer efficiency per age-appropriate norms    Baseline 12.76 sec on 08/05/21    Status On-going                   Plan - 08/05/21 1232     Clinical Impression Statement Patient is progressing well with her LTGs. She has made signfiicant improvements in LE strength, but still has some weakness in bil LE R > L which is  affecting sit to stand and floor to stand transfers. Her gait is WNL on stairs although there is visible weakess in the R LE compared to the left. Time did not allow completion of full assessment of LTGs and will be completed at next visit. She will continue to benefit from skillled PT to achieve her remaining LTGs.    Personal Factors and Comorbidities Age;Comorbidity 3+;Past/Current Experience;Time since onset of injury/illness/exacerbation    Comorbidities Lumbar stenosis, HTN, MI, sinus bradycardia, osteoporosis, B knee trauma d/t falls    Examination-Activity Limitations [place];Caring for Others;Locomotion Level;Squat;Stairs;Transfers    Examination-Participation Restrictions Cleaning;Community Activity;Laundry;Shop;Yard Work    PT Frequency 2x / week    PT Duration 6 weeks    PT Treatment/Interventions ADLs/Self Care Home Management;Cryotherapy;Electrical Stimulation;Iontophoresis 42m/ml Dexamethasone;Moist Heat;Gait training;Stair training;Functional mobility training;Therapeutic activities;Therapeutic exercise;Balance training;Neuromuscular re-education;Manual techniques;Passive range of motion;Dry needling;Taping;Spinal Manipulations    PT Next Visit Plan Assess BERG and FGA goals,  continue to work on standing hip flexion strength, sit to stands and  balance training    PT Home Exercise Plan Access Code: [email or web address] (2/2)    Consulted and Agree with Plan of Care Patient             Patient will benefit from skilled therapeutic intervention in order to improve the following deficits and impairments:  Decreased activity tolerance, Decreased balance, Decreased endurance, Decreased mobility, Decreased range of motion, Decreased strength, Difficulty walking, Impaired perceived functional ability, Impaired flexibility, Improper body mechanics, Postural dysfunction, Pain  Visit Diagnosis: Muscle weakness (generalized)  Other abnormalities of gait and mobility  Unsteadiness on  feet     Problem List Patient Active Problem List   Diagnosis Date Noted   Spinal stenosis of lumbar region 06/19/2021   Low back pain with radiation 06/05/2021   Right leg weakness 06/05/2021   Primary hypertension 06/05/2021   Pain in left shin 01/01/2020   Adenomatous polyp s/p LAR rectosigmoid resection 07/12/2019 05/15/2019   Age-related osteoporosis without current pathological fracture 04/06/2019   Sinus bradycardia 02/24/2017   Post-menopausal bleeding 10/28/2016   Left shoulder pain 01/22/2016   Right wrist injury 03/18/2015   Obesity (BMI 30-39.9) 05/29/2013   Otitis externa 09/30/2010   CONJUNCTIVITIS, BACTERIAL 12/16/2009   SINUSITIS- ACUTE-NOS 08/01/2009   TOBACCO ABUSE, HX OF 07/22/2009   OTITIS EXTERNA, ACUTE, RIGHT 02/08/2009   Goiter 02/10/2007   Hyperlipidemia 02/10/2007   Essential hypertension 02/10/2007   MYOCARDIAL INFARCTION, HX OF 02/10/2007   DIZZINESS 02/10/2007   EDEMA LEG 02/10/2007   FATTY LIVER DISEASE, HX OF 02/10/2007   JMadelyn Flavors PT 08/05/2021, 12:44 PM  [facility] [address] [place] [address]Phone: [phone]  Fax:  [phone] Name: Tina CAMARENAMRN: [id number] of Birth: 1November 27, 1947

## 2021-08-05 NOTE — Patient Instructions (Signed)
Access Code: [email or web address] ?URL: [email or web address] ?Date: 08/05/2021 ?Prepared by: Almyra Free ? ?Exercises ?Hooklying Hamstring Stretch with Strap - 2 x daily - 7 x weekly - 3 reps - 30 sec hold ?Supine Figure 4 Piriformis Stretch - 2 x daily - 7 x weekly - 3 reps - 30 sec hold ?Supine Piriformis Stretch with Foot on Ground - 2 x daily - 7 x weekly - 3 reps - 30 sec hold ?Bridge with Resistance - 1 x daily - 5 x weekly - 2 sets - 10 reps - 5 sec hold ?Clamshell with Resistance - 1 x daily - 7 x weekly - 2 sets - 10 reps - 3 second hold ?Standing Hip Flexion with Anchored Resistance and Chair Support - 1 x daily - 7 x weekly - 2 sets - 10 reps - 3 sec hold ?Standing Hip Adduction with Anchored Resistance - 1 x daily - 7 x weekly - 2 sets - 10 reps - 3 sec hold ?Standing Hip Extension with Anchored Resistance - 1 x daily - 7 x weekly - 2 sets - 10 reps - 3 sec hold ?Standing Hip Abduction with Anchored Resistance - 1 x daily - 7 x weekly - 2 sets - 10 reps - 3 sec hold ?Marching with Resistance - 1 x daily - 7 x weekly - 3 sets - 10 reps ? ?

## 2021-08-05 NOTE — Telephone Encounter (Signed)
Called and left Ria Comment a vm.  ? ?She has been on Fosamax.  ?

## 2021-08-05 NOTE — Telephone Encounter (Signed)
Mendel Ryder called again regarding update!  ? ?Please advise  ?

## 2021-08-06 ENCOUNTER — Telehealth: Payer: Self-pay | Admitting: Family Medicine

## 2021-08-06 NOTE — Telephone Encounter (Signed)
You Just now (11:32 AM)  ? ?Duplicate task- will return call later today.   ?  ?  ?Note   ? ?Tina [name] B routed conversation to Team Prolia 1 hour ago (10:03 AM)  ? ?Tina [name] B 1 hour ago (10:03 AM)  ? ?Thermal ?[employer] was calling Brandi back regarding pt's prolia status. Please advise. [phone] ext. [phone]  ?  ? ?

## 2021-08-06 NOTE — Telephone Encounter (Signed)
[employer] was calling Brandi back regarding pt's prolia status. Please advise. [phone] ext. [phone] ?

## 2021-08-06 NOTE — Telephone Encounter (Signed)
Duplicate task- will return call later today.  ?

## 2021-08-07 ENCOUNTER — Other Ambulatory Visit: Payer: Self-pay

## 2021-08-07 ENCOUNTER — Encounter: Payer: Self-pay | Admitting: Physical Therapy

## 2021-08-07 ENCOUNTER — Ambulatory Visit: Payer: Medicare Other | Admitting: Physical Therapy

## 2021-08-07 DIAGNOSIS — R2681 Unsteadiness on feet: Secondary | ICD-10-CM

## 2021-08-07 DIAGNOSIS — R2689 Other abnormalities of gait and mobility: Secondary | ICD-10-CM

## 2021-08-07 DIAGNOSIS — M6281 Muscle weakness (generalized): Secondary | ICD-10-CM

## 2021-08-07 NOTE — Patient Instructions (Signed)
? ?  Access Code: [email or web address] ?URL: [email or web address] ?Date: 08/07/2021 ?Prepared by: Annie Paras ? ?Exercises ?Hooklying Hamstring Stretch with Strap - 2 x daily - 7 x weekly - 3 reps - 30 sec hold ?Supine Figure 4 Piriformis Stretch - 2 x daily - 7 x weekly - 3 reps - 30 sec hold ?Supine Piriformis Stretch with Foot on Ground - 2 x daily - 7 x weekly - 3 reps - 30 sec hold ?Bridge with Resistance - 1 x daily - 5 x weekly - 2 sets - 10 reps - 5 sec hold ?Clamshell with Resistance - 1 x daily - 7 x weekly - 2 sets - 10 reps - 3 second hold ?Marching with Resistance - 1 x daily - 7 x weekly - 3 sets - 10 reps ?Standing Hip Flexion with Anchored Resistance and Chair Support - 1 x daily - 7 x weekly - 2 sets - 10 reps - 3 sec hold ?Standing Hip Adduction with Anchored Resistance - 1 x daily - 7 x weekly - 2 sets - 10 reps - 3 sec hold ?Standing Hip Extension with Anchored Resistance - 1 x daily - 7 x weekly - 2 sets - 10 reps - 3 sec hold ?Standing Hip Abduction with Anchored Resistance - 1 x daily - 7 x weekly - 2 sets - 10 reps - 3 sec hold ?Squat with Chair Touch and Resistance Loop - 1 x daily - 7 x weekly - 2 sets - 10 reps - 3 sec hold ?Sit to Stand with Resistance Around Legs - 1 x daily - 7 x weekly - 2 sets - 10 reps ? ?

## 2021-08-07 NOTE — Therapy (Signed)
[facility] [address]  [place], [place], [address] Phone: [phone]   Fax:  [phone]  Physical Therapy Treatment  Patient Details  Name: Tina [name] MRN: [id number] Date of Birth: 03-15-1946 Referring Provider (PT): Roma Schanz, [place]   Encounter Date: 08/07/2021   PT End of Session - 08/07/21 0846     Visit Number 11    Number of Visits 13    Date for PT Re-Evaluation 08/14/21    Authorization Type [employer] Medicare    Progress Note Due on Visit 75    PT Start Time [phone]    PT Stop Time 0932    PT Time Calculation (min) 46 min    Activity Tolerance Patient tolerated treatment well    Behavior During Therapy [facility] for tasks assessed/performed             Past Medical History:  Diagnosis Date   Allergy    seasonal   Cataract    removed both eyes   Fatty liver    Goiter    treated   HTN (hypertension)    Hyperlipidemia    Myocardial infarct ([place]) hx of 2004   NSTEMI with stent   Osteoporosis    Patellar fracture    Sleep apnea    no cpap   Wrist fracture    right wrist    Past Surgical History:  Procedure Laterality Date   CARDIAC CATHETERIZATION  2004   stent   CATARACT EXTRACTION, BILATERAL  05/2020   COLONOSCOPY     POLYPECTOMY     PROCTOSCOPY N/A 07/12/2019   Procedure: RIGID PROCTOSCOPY;  Surgeon: Michael Boston, MD;  Location: [facility];  Service: General;  Laterality: N/A;   sleep study  2012    There were no vitals filed for this visit.   Subjective Assessment - 08/07/21 0849     Subjective Pt reports she feels like she is progressing with PT but does note she still moves more slowly/cautiously to make sure she feels balanced.    Patient Stated Goals "to work on the weakness in my legs so I can go up and down stairs normally"    Currently in Pain? No/denies                [facility] PT Assessment - 08/07/21 0846       Assessment   Medical Diagnosis Lumbar spinal  stenosis    Referring Provider (PT) Roma Schanz, DO    Next MD Visit 12/18/21 - routine bloodwork f/u in 6 months      Single Leg Stance   Comments R = 13.28 sec; L = 19.72 sec      Ambulation/Gait   Assistive device None    Gait Pattern Within Functional Limits    Ambulation Surface Level;Indoor    Gait velocity 4.30 ft/sec      Standardized Balance Assessment   10 Meter Walk 7.62 sec      Berg Balance Test   Sit to Stand Able to stand  independently using hands    Standing Unsupported Able to stand safely 2 minutes    Sitting with Back Unsupported but Feet Supported on Floor or Stool Able to sit safely and securely 2 minutes    Stand to Sit Sits safely with minimal use of hands    Transfers Able to transfer safely, minor use of hands    Standing Unsupported with Eyes Closed Able to stand 10 seconds  safely    Standing Unsupported with Feet Together Able to place feet together independently and stand 1 minute safely    From Standing, Reach Forward with Outstretched Arm Can reach confidently >25 cm (10")    From Standing Position, Pick up Object from Floor Able to pick up shoe safely and easily    From Standing Position, Turn to Look Behind Over each Shoulder Looks behind one side only/other side shows less weight shift    Turn 360 Degrees Able to turn 360 degrees safely in 4 seconds or less    Standing Unsupported, Alternately Place Feet on Step/Stool Able to stand independently and safely and complete 8 steps in 20 seconds    Standing Unsupported, One Foot in [place] to place foot tandem independently and hold 30 seconds    Standing on One Leg Able to lift leg independently and hold > 10 seconds   R = 13.28 sec; L = 19.72 sec   Total Score 54    Berg comment: 52-55 lower fall risk (> 25%)      Functional Gait  Assessment   Gait Level Surface Walks 20 ft in less than 5.5 sec, no assistive devices, good speed, no evidence for imbalance, normal gait pattern, deviates no more  than 6 in outside of the 12 in walkway width.    Change in Gait Speed Able to smoothly change walking speed without loss of balance or gait deviation. Deviate no more than 6 in outside of the 12 in walkway width.    Gait with Horizontal Head Turns Performs head turns smoothly with no change in gait. Deviates no more than 6 in outside 12 in walkway width    Gait with Vertical Head Turns Performs head turns with no change in gait. Deviates no more than 6 in outside 12 in walkway width.    Gait and Pivot Turn Pivot turns safely within 3 sec and stops quickly with no loss of balance.    Step Over Obstacle Is able to step over 2 stacked shoe boxes taped together (9 in total height) without changing gait speed. No evidence of imbalance.    Gait with Narrow Base of Support Is able to ambulate for 10 steps heel to toe with no staggering.    Gait with Eyes Closed Walks 20 ft, uses assistive device, slower speed, mild gait deviations, deviates 6-10 in outside 12 in walkway width. Ambulates 20 ft in less than 9 sec but greater than 7 sec.    Ambulating Backwards Walks 20 ft, no assistive devices, good speed, no evidence for imbalance, normal gait    Steps Alternating feet, must use rail.    Total Score 28    FGA comment: 25-28 = low risk fall                           [facility] Adult PT Treatment/Exercise - 08/07/21 0846       Knee/Hip Exercises: Aerobic   Nustep L7 x 6 min      Knee/Hip Exercises: Standing   Functional Squat 10 reps;3 seconds    Functional Squat Limitations squat-touch to chair with Airex pad in seat + red TB hip ABD isometric to prevent genu valgum      Knee/Hip Exercises: Seated   Sit to Sand 15 reps;without UE support;10 reps   From blue foam on chair with red TB at knees to avoid genu valgus  PT Education - 08/07/21 0930     Education Details HEP update - squats & sit to/from stand with red TB hip ABD isometric    Person(s) Educated  Patient    Methods Explanation;Demonstration;Verbal cues;Handout    Comprehension Verbalized understanding;Verbal cues required;Returned demonstration              PT Short Term Goals - 07/24/21 1024       PT SHORT TERM GOAL #1   Title Patient will be independent in initial HEP to improve strength/mobility for better functional independence with ADLs    Status Achieved   07/11/21     PT SHORT TERM GOAL #2   Title Complete balance assessment and update LTGs as indicated    Status Achieved   07/11/21     PT SHORT TERM GOAL #3   Title Patient will verbalize understanding of fall prevention measures in home to reduce risk for falls    Status Achieved   07/24/21     PT SHORT TERM GOAL #4   Title Patient will verbalize/demonstrate good awareness of neutral spine posture and proper body mechanics for daily tasks    Status Achieved   07/24/21              PT Long Term Goals - 08/07/21 0851       PT LONG TERM GOAL #1   Title Patient will be independent with ongoing/advanced HEP for improved strength, balance, transfers and gait    Status Partially Met    Target Date 08/14/21      PT LONG TERM GOAL #2   Title Patient will demonstrate improved B LE strength to >/= 4/5 to 4+/5 for improved stability and ease of mobility    Status Partially Met    Target Date 08/14/21      PT LONG TERM GOAL #3   Title Patient able to ascend/descend stairs with reciprocal gait and single rail support with good mechanics    Status Achieved   08/05/21     PT LONG TERM GOAL #4   Title Patient will report increased ease of transfers to/from floor for safe mobility within home    Baseline still difficult but easier than it was a month ago    Status Partially Met    Target Date 08/14/21      PT LONG TERM GOAL #5   Title Patient to report ability to perform ADLs, household tasks and leisure activities without limitation due to weakness or LE instability    Baseline able to walk the dog and vaccuum  without difficulty.    Status Achieved   08/05/21     PT LONG TERM GOAL #6   Title Patient will improve 5x STS time to </= 12.6 seconds w/o UE assist to demonstrated improved functional strength and transfer efficiency per age-appropriate norms    Baseline 15.12 on 07/11/21; 12.76 sec on 08/05/21    Status On-going    Target Date 08/14/21      PT LONG TERM GOAL #7   Title Patient will improve Berg score to >/= 54/56 to improve safety and stability with ADLs in standing and reduce risk for falls    Baseline 49/56 on 07/11/21; 54/56 on 08/07/21    Status Achieved   08/07/21     PT LONG TERM GOAL #8   Title Patient will improve FGA score to >/= 27/30 to improve gait stability and reduce risk for falls    Baseline 23/30 on 07/11/21; 28/30 on  08/07/21    Status Achieved   08/07/21                  Plan - 08/07/21 0932     Clinical Impression Statement Geryl is pleased with her progress with PT but does note continued caution with her mobility and activities to decrease risk for LOB/falls. Balance assessment with Berg and FGA demonstrating gains to low fall risk levels with corresponding LTGs now met. She continues to have difficulty with sit to stand w/o UE assist, demonstrating continued genu valgum with effort. Reviewed sit to stand and squat-touch exercises using red TB hip ABD isometric to decrease genu valgum and provided HEP instructions for these exercises. Iyauna is nearing the end of her current POC and feels that she may be ready to try transitioning to her HEP, therefore will plan for formal HEP review and update next visit to ensure ongoing HEP addresses her remaining deficits/issues.    Comorbidities Lumbar stenosis, HTN, MI, sinus bradycardia, osteoporosis, B knee trauma d/t falls    Rehab Potential Good    PT Frequency 2x / week    PT Duration 6 weeks    PT Treatment/Interventions ADLs/Self Care Home Management;Cryotherapy;Electrical Stimulation;Iontophoresis 19m/ml Dexamethasone;Moist  Heat;Gait training;Stair training;Functional mobility training;Therapeutic activities;Therapeutic exercise;Balance training;Neuromuscular re-education;Manual techniques;Passive range of motion;Dry needling;Taping;Spinal Manipulations    PT Next Visit Plan HEP review & update in prep for possible transition to HEP at end of current POC; continue to work on standing hip flexion strength, sit to stands and balance training    PT Home Exercise Plan Access Code: [email or web address])    Consulted and Agree with Plan of Care Patient             Patient will benefit from skilled therapeutic intervention in order to improve the following deficits and impairments:  Decreased activity tolerance, Decreased balance, Decreased endurance, Decreased mobility, Decreased range of motion, Decreased strength, Difficulty walking, Impaired perceived functional ability, Impaired flexibility, Improper body mechanics, Postural dysfunction, Pain  Visit Diagnosis: Muscle weakness (generalized)  Other abnormalities of gait and mobility  Unsteadiness on feet     Problem List Patient Active Problem List   Diagnosis Date Noted   Spinal stenosis of lumbar region 06/19/2021   Low back pain with radiation 06/05/2021   Right leg weakness 06/05/2021   Primary hypertension 06/05/2021   Pain in left shin 01/01/2020   Adenomatous polyp s/p LAR rectosigmoid resection 07/12/2019 05/15/2019   Age-related osteoporosis without current pathological fracture 04/06/2019   Sinus bradycardia 02/24/2017   Post-menopausal bleeding 10/28/2016   Left shoulder pain 01/22/2016   Right wrist injury 03/18/2015   Obesity (BMI 30-39.9) 05/29/2013   Otitis externa 09/30/2010   CONJUNCTIVITIS, BACTERIAL 12/16/2009   SINUSITIS- ACUTE-NOS 08/01/2009   TOBACCO ABUSE, HX OF 07/22/2009   OTITIS EXTERNA, ACUTE, RIGHT 02/08/2009   Goiter 02/10/2007   Hyperlipidemia 02/10/2007   Essential hypertension 02/10/2007   MYOCARDIAL INFARCTION, HX OF  02/10/2007   DIZZINESS 02/10/2007   EDEMA LEG 02/10/2007   FATTY LIVER DISEASE, HX OF 02/10/2007    JPercival Spanish PT 08/07/2021, 12:30 PM  [facility] [address] [place] [address]Phone: [phone]  Fax:  [phone] Name: ADAUNA ZISKAMRN: [id number] of Birth: 105/29/1947

## 2021-08-09 NOTE — Telephone Encounter (Signed)
Coverage for [employer] DENIED ? ? ?

## 2021-08-11 ENCOUNTER — Other Ambulatory Visit: Payer: Self-pay

## 2021-08-11 ENCOUNTER — Ambulatory Visit: Payer: Medicare Other

## 2021-08-11 DIAGNOSIS — M6281 Muscle weakness (generalized): Secondary | ICD-10-CM

## 2021-08-11 DIAGNOSIS — R2681 Unsteadiness on feet: Secondary | ICD-10-CM

## 2021-08-11 DIAGNOSIS — R2689 Other abnormalities of gait and mobility: Secondary | ICD-10-CM

## 2021-08-11 NOTE — Therapy (Signed)
[facility] [address]  [place], [place], [address] Phone: [phone]   Fax:  [phone]  Physical Therapy Treatment  Patient Details  Name: Tina [name] MRN: [id number] Date of Birth: 1945-08-22 Referring Provider (PT): Roma Schanz, [place]   Encounter Date: 08/11/2021   PT End of Session - 08/11/21 0940     Visit Number 12    Number of Visits 13    Date for PT Re-Evaluation 08/14/21    Authorization Type [employer] Medicare    Progress Note Due on Visit 13    PT Start Time 0847    PT Stop Time 0929    PT Time Calculation (min) 42 min    Activity Tolerance Patient tolerated treatment well    Behavior During Therapy [facility] for tasks assessed/performed             Past Medical History:  Diagnosis Date   Allergy    seasonal   Cataract    removed both eyes   Fatty liver    Goiter    treated   HTN (hypertension)    Hyperlipidemia    Myocardial infarct ([place]) hx of 2004   NSTEMI with stent   Osteoporosis    Patellar fracture    Sleep apnea    no cpap   Wrist fracture    right wrist    Past Surgical History:  Procedure Laterality Date   CARDIAC CATHETERIZATION  2004   stent   CATARACT EXTRACTION, BILATERAL  05/2020   COLONOSCOPY     POLYPECTOMY     PROCTOSCOPY N/A 07/12/2019   Procedure: RIGID PROCTOSCOPY;  Surgeon: Michael Boston, MD;  Location: [facility];  Service: General;  Laterality: N/A;   sleep study  2012    There were no vitals filed for this visit.   Subjective Assessment - 08/11/21 0851     Subjective Everything seems to be progressing well.    Diagnostic tests 06/07/21 - lumbar MRI: 1. Symptomatic level favored to be L3-L4 where there is a cephalad disc extrusion into the right neural foramen. Query Right L3 radiculitis. Up to 9 mm sequestered disc fragment.   2. Generally mild for age lumbar spine degeneration otherwise. No lumbar spinal stenosis. Other mild neural foraminal stenosis,  up to moderate at left L5 nerve level.    Patient Stated Goals "to work on the weakness in my legs so I can go up and down stairs normally"    Currently in Pain? No/denies                [facility] PT Assessment - 08/11/21 0001       Standardized Balance Assessment   Five times sit to stand comments  11.22 sec                           [facility] Adult PT Treatment/Exercise - 08/11/21 0001       Knee/Hip Exercises: Aerobic   Nustep L7 x 6 min      Knee/Hip Exercises: Standing   Hip Flexion Stengthening;Both;10 reps;Knee straight    Hip Flexion Limitations red TB at ankles; 1 hand support    Lateral Step Up Right;Left;10 reps;Hand Hold: 1;Step Height: 8"    Lateral Step Up Limitations cues for eccentric control    Step Down Right;Left;15 reps;Hand Hold: 1;Step Height: 8"    Step Down Limitations lateral eccentric step down    Functional Squat  2 sets;10 reps    Functional Squat Limitations squat-touch to chair with  bolster, then progressed to Airex pad in seat + red TB hip ABD isometric to prevent genu valgum      Knee/Hip Exercises: Seated   Sit to Sand 2 sets;10 reps;without UE support   red TB at knee to prevent genu valgus     Knee/Hip Exercises: Sidelying   Clams R reverse clams with 2# weight, ball btw knees x 15                       PT Short Term Goals - 07/24/21 1024       PT SHORT TERM GOAL #1   Title Patient will be independent in initial HEP to improve strength/mobility for better functional independence with ADLs    Status Achieved   07/11/21     PT SHORT TERM GOAL #2   Title Complete balance assessment and update LTGs as indicated    Status Achieved   07/11/21     PT SHORT TERM GOAL #3   Title Patient will verbalize understanding of fall prevention measures in home to reduce risk for falls    Status Achieved   07/24/21     PT SHORT TERM GOAL #4   Title Patient will verbalize/demonstrate good awareness of neutral spine posture and  proper body mechanics for daily tasks    Status Achieved   07/24/21              PT Long Term Goals - 08/07/21 0851       PT LONG TERM GOAL #1   Title Patient will be independent with ongoing/advanced HEP for improved strength, balance, transfers and gait    Status Partially Met    Target Date 08/14/21      PT LONG TERM GOAL #2   Title Patient will demonstrate improved B LE strength to >/= 4/5 to 4+/5 for improved stability and ease of mobility    Status Partially Met    Target Date 08/14/21      PT LONG TERM GOAL #3   Title Patient able to ascend/descend stairs with reciprocal gait and single rail support with good mechanics    Status Achieved   08/05/21     PT LONG TERM GOAL #4   Title Patient will report increased ease of transfers to/from floor for safe mobility within home    Baseline still difficult but easier than it was a month ago    Status Partially Met    Target Date 08/14/21      PT LONG TERM GOAL #5   Title Patient to report ability to perform ADLs, household tasks and leisure activities without limitation due to weakness or LE instability    Baseline able to walk the dog and vaccuum without difficulty.    Status Achieved   08/05/21     PT LONG TERM GOAL #6   Title Patient will improve 5x STS time to </= 12.6 seconds w/o UE assist to demonstrated improved functional strength and transfer efficiency per age-appropriate norms    Baseline 15.12 on 07/11/21; 12.76 sec on 08/05/21    Status On-going    Target Date 08/14/21      PT LONG TERM GOAL #7   Title Patient will improve Berg score to >/= 54/56 to improve safety and stability with ADLs in standing and reduce risk for falls    Baseline 49/56 on 07/11/21; 54/56 on 08/07/21  Status Achieved   08/07/21     PT LONG TERM GOAL #8   Title Patient will improve FGA score to >/= 27/30 to improve gait stability and reduce risk for falls    Baseline 23/30 on 07/11/21; 28/30 on 08/07/21    Status Achieved   08/07/21                   Plan - 08/11/21 0940     Clinical Impression Statement Pt shows a better score on the 5x STS test today with 11.22 sec. Reviewed HEP and progressed marches with red TB, also added lateral step down for glute strengthening. Pt still has tendency to allow R knee valgus collapse with squat but better controlled with TB at knees. She seems to be onboard with transition to HEP so as of now will plan for this next visit.    Personal Factors and Comorbidities Age;Comorbidity 3+;Past/Current Experience;Time since onset of injury/illness/exacerbation    Comorbidities Lumbar stenosis, HTN, MI, sinus bradycardia, osteoporosis, B knee trauma d/t falls    PT Frequency 2x / week    PT Duration 6 weeks    PT Treatment/Interventions ADLs/Self Care Home Management;Cryotherapy;Electrical Stimulation;Iontophoresis 62m/ml Dexamethasone;Moist Heat;Gait training;Stair training;Functional mobility training;Therapeutic activities;Therapeutic exercise;Balance training;Neuromuscular re-education;Manual techniques;Passive range of motion;Dry needling;Taping;Spinal Manipulations    PT Next Visit Plan further review of HEP and clarification for anything before 30 day hold; check any remaining goals    PT Home Exercise Plan Access Code: [email or web address])    Consulted and Agree with Plan of Care Patient             Patient will benefit from skilled therapeutic intervention in order to improve the following deficits and impairments:  Decreased activity tolerance, Decreased balance, Decreased endurance, Decreased mobility, Decreased range of motion, Decreased strength, Difficulty walking, Impaired perceived functional ability, Impaired flexibility, Improper body mechanics, Postural dysfunction, Pain  Visit Diagnosis: Muscle weakness (generalized)  Other abnormalities of gait and mobility  Unsteadiness on feet     Problem List Patient Active Problem List   Diagnosis Date Noted   Spinal stenosis of  lumbar region 06/19/2021   Low back pain with radiation 06/05/2021   Right leg weakness 06/05/2021   Primary hypertension 06/05/2021   Pain in left shin 01/01/2020   Adenomatous polyp s/p LAR rectosigmoid resection 07/12/2019 05/15/2019   Age-related osteoporosis without current pathological fracture 04/06/2019   Sinus bradycardia 02/24/2017   Post-menopausal bleeding 10/28/2016   Left shoulder pain 01/22/2016   Right wrist injury 03/18/2015   Obesity (BMI 30-39.9) 05/29/2013   Otitis externa 09/30/2010   CONJUNCTIVITIS, BACTERIAL 12/16/2009   SINUSITIS- ACUTE-NOS 08/01/2009   TOBACCO ABUSE, HX OF 07/22/2009   OTITIS EXTERNA, ACUTE, RIGHT 02/08/2009   Goiter 02/10/2007   Hyperlipidemia 02/10/2007   Essential hypertension 02/10/2007   MYOCARDIAL INFARCTION, HX OF 02/10/2007   DIZZINESS 02/10/2007   EDEMA LEG 02/10/2007   FATTY LIVER DISEASE, HX OF 02/10/2007    BArtist Pais PTA 08/11/2021, 11:13 AM  [facility] [place] [address]Phone: [phone]  Fax:  [phone] Name: Tina KINGMAMRN: [id number] of Birth: [birth year]

## 2021-08-14 ENCOUNTER — Encounter: Payer: Self-pay | Admitting: Physical Therapy

## 2021-08-14 ENCOUNTER — Other Ambulatory Visit: Payer: Self-pay

## 2021-08-14 ENCOUNTER — Ambulatory Visit: Payer: Medicare Other | Admitting: Physical Therapy

## 2021-08-14 DIAGNOSIS — M6281 Muscle weakness (generalized): Secondary | ICD-10-CM | POA: Diagnosis not present

## 2021-08-14 DIAGNOSIS — R2681 Unsteadiness on feet: Secondary | ICD-10-CM | POA: Diagnosis not present

## 2021-08-14 DIAGNOSIS — R2689 Other abnormalities of gait and mobility: Secondary | ICD-10-CM | POA: Diagnosis not present

## 2021-08-14 NOTE — Therapy (Addendum)
PHYSICAL THERAPY DISCHARGE SUMMARY  Visits from Start of Care: 13  Current functional level related to goals / functional outcomes: From progress note below "Tina [name] is pleased with her progress noting improved strength, balance, and functional mobility, including now able to climb stairs reciprocally where she could not do this before. She notes improving ease of floor transfers but does still tend to rely on external support. Her overall LE strength is now grossly 4/5 to 5/5 with weakest motions including R hip flexion, abduction and adduction at 4/5, and remainder of B hip strength 4+/5 to 5/5, and B knees and ankles 5/5. She has demonstrated good improvement across all standardized balance testing with 5xSTS improved from 15.12 sec to 11.22 sec, Berg improved from 49/56 to 54/56 and FGA improved from 23/30 to 28/30. All goals now met, and Tina [name] feels comfortable with plan to transition to her HEP at this time but would like to remain on hold for 30-days in the event that issues arise "   Remaining deficits: See above   Education / Equipment: HEP  Plan: Patient agrees to discharge.  Patient is being discharged due to meeting the stated rehab goals.  Patient was placed 30 day hold due to progress on 08/14/2021 and has not returned within stated time frame.     Tina [name], PT, DPT 1:23 PM 11/05/2021  [facility] [address]  [place], [place], [address] Phone: [phone]   Fax:  [phone]  Physical Therapy Treatment / Progress Note  Patient Details  Name: Tina [name] MRN: [id number] Date of Birth: [birth year] Referring Provider (PT): Roma Schanz, DO   Progress Note  Reporting Period 07/24/2021 to 08/14/2021  See note below for Objective Data and Assessment of Progress/Goals.     Encounter Date: 08/14/2021   PT End of Session - 08/14/21 0846     Visit Number 13    Number of Visits 13    Date for PT  Re-Evaluation 08/14/21    Authorization Type [employer] Medicare    PT Start Time 0846    PT Stop Time 0931    PT Time Calculation (min) 45 min    Activity Tolerance Patient tolerated treatment well    Behavior During Therapy [facility] for tasks assessed/performed             Past Medical History:  Diagnosis Date   Allergy    seasonal   Cataract    removed both eyes   Fatty liver    Goiter    treated   HTN (hypertension)    Hyperlipidemia    Myocardial infarct ([place]) hx of 2004   NSTEMI with stent   Osteoporosis    Patellar fracture    Sleep apnea    no cpap   Wrist fracture    right wrist    Past Surgical History:  Procedure Laterality Date   CARDIAC CATHETERIZATION  2004   stent   CATARACT EXTRACTION, BILATERAL  05/2020   COLONOSCOPY     POLYPECTOMY     PROCTOSCOPY N/A 07/12/2019   Procedure: RIGID PROCTOSCOPY;  Surgeon: Michael Boston, MD;  Location: [facility];  Service: General;  Laterality: N/A;   sleep study  2012    There were no vitals filed for this visit.   Subjective Assessment - 08/14/21 0847     Subjective Pt feels much better since working with PT. She notes still needing to focus on sit to stand technique  to avoid medial knee collapse and decrease need for UE assist. Also notes now able to climb stairs reciprocally but does not feel "fluid".    Diagnostic tests 06/07/21 - lumbar MRI: 1. Symptomatic level favored to be L3-L4 where there is a cephalad disc extrusion into the right neural foramen. Query Right L3 radiculitis. Up to 9 mm sequestered disc fragment.   2. Generally mild for age lumbar spine degeneration otherwise. No lumbar spinal stenosis. Other mild neural foraminal stenosis, up to moderate at left L5 nerve level.    Patient Stated Goals "to work on the weakness in my legs so I can go up and down stairs normally"                [facility] PT Assessment - 08/14/21 0846       Assessment   Medical Diagnosis Lumbar spinal stenosis    Referring Provider  (PT) Roma Schanz, DO    Next MD Visit 12/18/21 - routine bloodwork f/u in 6 months      Strength   Right Hip Flexion 4/5    Right Hip Extension 5/5    Right Hip External Rotation  4+/5    Right Hip Internal Rotation 5/5    Right Hip ABduction 4/5    Right Hip ADduction 4/5    Left Hip Flexion 4+/5    Left Hip Extension 5/5    Left Hip External Rotation 5/5    Left Hip Internal Rotation 5/5    Left Hip ABduction 4+/5    Left Hip ADduction 5/5                           [facility] Adult PT Treatment/Exercise - 08/14/21 0846       Ambulation/Gait   Stairs Assistance 6: Modified independent (Device/Increase time)    Stair Management Technique One rail Right;Alternating pattern;Forwards    Number of Stairs --   one flight each way in building   Height of Stairs 7    Gait Comments able to ascend/descend reciprocally but increased effort when leading with R foot on ascent, no issues noted on descent      Knee/Hip Exercises: Aerobic   Recumbent Bike L2 x 6 min      Knee/Hip Exercises: Seated   Sit to Sand 5 reps;without UE support   w/o band focusing on avoidance of genu valgum     Knee/Hip Exercises: Sidelying   Hip ABduction Right;10 reps;Strengthening    Hip ABduction Limitations diagonal lift into abduction + extension                     PT Education - 08/14/21 0931     Education Details goal status, final HEP review & recommendations for continued HEP frequency; green TB provided for progression of standing 4-way hip SLR    Person(s) Educated Patient    Methods Explanation;Verbal cues;Tactile cues;Handout    Comprehension Verbalized understanding;Verbal cues required;Tactile cues required;Returned demonstration              PT Short Term Goals - 07/24/21 1024       PT SHORT TERM GOAL #1   Title Patient will be independent in initial HEP to improve strength/mobility for better functional independence with ADLs    Status Achieved    07/11/21     PT SHORT TERM GOAL #2   Title Complete balance assessment and update LTGs as indicated    Status Achieved  07/11/21     PT SHORT TERM GOAL #3   Title Patient will verbalize understanding of fall prevention measures in home to reduce risk for falls    Status Achieved   07/24/21     PT SHORT TERM GOAL #4   Title Patient will verbalize/demonstrate good awareness of neutral spine posture and proper body mechanics for daily tasks    Status Achieved   07/24/21              PT Long Term Goals - 08/14/21 0849       PT LONG TERM GOAL #1   Title Patient will be independent with ongoing/advanced HEP for improved strength, balance, transfers and gait    Status Achieved   08/14/21     PT LONG TERM GOAL #2   Title Patient will demonstrate improved B LE strength to >/= 4/5 to 4+/5 for improved stability and ease of mobility    Status Achieved    Target Date 08/14/21      PT LONG TERM GOAL #3   Title Patient able to ascend/descend stairs with reciprocal gait and single rail support with good mechanics    Status Achieved   08/05/21     PT LONG TERM GOAL #4   Title Patient will report increased ease of transfers to/from floor for safe mobility within home    Baseline --    Status Achieved   08/14/21 - still difficult w/o external support but easier than it was at start of PT     PT LONG TERM GOAL #5   Title Patient to report ability to perform ADLs, household tasks and leisure activities without limitation due to weakness or LE instability    Baseline able to walk the dog and vaccuum without difficulty.    Status Achieved   08/05/21     PT LONG TERM GOAL #6   Title Patient will improve 5x STS time to </= 12.6 seconds w/o UE assist to demonstrated improved functional strength and transfer efficiency per age-appropriate norms    Baseline 15.12 on 07/11/21; 12.76 sec on 08/05/21; 11.22 sec on 08/11/21    Status Achieved   08/11/21     PT LONG TERM GOAL #7   Title Patient will improve  Berg score to >/= 54/56 to improve safety and stability with ADLs in standing and reduce risk for falls    Baseline 49/56 on 07/11/21; 54/56 on 08/07/21    Status Achieved   08/07/21     PT LONG TERM GOAL #8   Title Patient will improve FGA score to >/= 27/30 to improve gait stability and reduce risk for falls    Baseline 23/30 on 07/11/21; 28/30 on 08/07/21    Status Achieved   08/07/21                  Plan - 08/14/21 0931     Clinical Impression Statement Tina [name] is pleased with her progress noting improved strength, balance, and functional mobility, including now able to climb stairs reciprocally where she could not do this before. She notes improving ease of floor transfers but does still tend to rely on external support. Her overall LE strength is now grossly 4/5 to 5/5 with weakest motions including R hip flexion, abduction and adduction at 4/5, and remainder of B hip strength 4+/5 to 5/5, and B knees and ankles 5/5. She has demonstrated good improvement across all standardized balance testing with 5xSTS improved from 15.12 sec to 11.22 sec, Tina [name]  improved from 49/56 to 54/56 and FGA improved from 23/30 to 28/30. All goals now met, and Tina [name] feels comfortable with plan to transition to her HEP at this time but would like to remain on hold for 30-days in the event that issues arise necessitating a return to PT.    Comorbidities Lumbar stenosis, HTN, MI, sinus bradycardia, osteoporosis, B knee trauma d/t falls    PT Treatment/Interventions ADLs/Self Care Home Management;Cryotherapy;Electrical Stimulation;Iontophoresis 73m/ml Dexamethasone;Moist Heat;Gait training;Stair training;Functional mobility training;Therapeutic activities;Therapeutic exercise;Balance training;Neuromuscular re-education;Manual techniques;Passive range of motion;Dry needling;Taping;Spinal Manipulations    PT Next Visit Plan transition to HEP + 30-day hold    PT Home Exercise Plan Access Code: [email or web address]    Consulted and Agree  with Plan of Care Patient             Patient will benefit from skilled therapeutic intervention in order to improve the following deficits and impairments:  Decreased activity tolerance, Decreased balance, Decreased endurance, Decreased mobility, Decreased range of motion, Decreased strength, Difficulty walking, Impaired perceived functional ability, Impaired flexibility, Improper body mechanics, Postural dysfunction, Pain  Visit Diagnosis: Muscle weakness (generalized)  Other abnormalities of gait and mobility  Unsteadiness on feet     Problem List Patient Active Problem List   Diagnosis Date Noted   Spinal stenosis of lumbar region 06/19/2021   Low back pain with radiation 06/05/2021   Right leg weakness 06/05/2021   Primary hypertension 06/05/2021   Pain in left shin 01/01/2020   Adenomatous polyp s/p LAR rectosigmoid resection 07/12/2019 05/15/2019   Age-related osteoporosis without current pathological fracture 04/06/2019   Sinus bradycardia 02/24/2017   Post-menopausal bleeding 10/28/2016   Left shoulder pain 01/22/2016   Right wrist injury 03/18/2015   Obesity (BMI 30-39.9) 05/29/2013   Otitis externa 09/30/2010   CONJUNCTIVITIS, BACTERIAL 12/16/2009   SINUSITIS- ACUTE-NOS 08/01/2009   TOBACCO ABUSE, HX OF 07/22/2009   OTITIS EXTERNA, ACUTE, RIGHT 02/08/2009   Goiter 02/10/2007   Hyperlipidemia 02/10/2007   Essential hypertension 02/10/2007   MYOCARDIAL INFARCTION, HX OF 02/10/2007   DIZZINESS 02/10/2007   EDEMA LEG 02/10/2007   FATTY LIVER DISEASE, HX OF 02/10/2007    JPercival Spanish PT 08/14/2021, 1:28 PM  [facility] [place] [place] [address]Phone: [phone]  Fax:  [phone] Name: Tina CRITTENDONMRN: [id number] of Birth: 105-18-1947

## 2021-08-14 NOTE — Patient Instructions (Signed)
? ?  Access Code: [email or web address] ?URL: [email or web address] ?Date: 08/14/2021 ?Prepared by: Annie Paras ? ?Exercises ?Hooklying Hamstring Stretch with Strap - 2 x daily - 7 x weekly - 3 reps - 30 sec hold ?Supine Figure 4 Piriformis Stretch - 2 x daily - 7 x weekly - 3 reps - 30 sec hold ?Supine Piriformis Stretch with Foot on Ground - 2 x daily - 7 x weekly - 3 reps - 30 sec hold ?Bridge with Resistance - 1 x daily - 3-4 x weekly - 2 sets - 10 reps - 5 sec hold ?Clamshell with Resistance - 1 x daily - 3-4 x weekly - 2 sets - 10 reps - 3 second hold ?Marching with Resistance - 1 x daily - 3-4 x weekly - 3 sets - 10 reps ?Standing Hip Flexion with Anchored Resistance and Chair Support - 1 x daily - 3-4 x weekly - 2 sets - 10 reps - 3 sec hold ?Standing Hip Adduction with Anchored Resistance - 1 x daily - 3-4 x weekly - 2 sets - 10 reps - 3 sec hold ?Standing Hip Extension with Anchored Resistance - 1 x daily - 3-4 x weekly - 2 sets - 10 reps - 3 sec hold ?Standing Hip Abduction with Anchored Resistance - 1 x daily - 3-4 x weekly - 2 sets - 10 reps - 3 sec hold ?Squat with Chair Touch and Resistance Loop - 1 x daily - 3-4 x weekly - 2 sets - 10 reps - 3 sec hold ?Sit to Stand with Resistance Around Legs - 1 x daily - 3-4 x weekly - 2 sets - 10 reps ?Side Step Down with Counter Support - 1 x daily - 3-4 x weekly - 2 sets - 10 reps ?Sidelying Diagonal Hip Abduction - 1 x daily - 3-4 x weekly - 2 sets - 10 reps - 3 sec hold ? ?

## 2021-08-15 ENCOUNTER — Telehealth: Payer: Self-pay | Admitting: Family Medicine

## 2021-08-15 NOTE — Telephone Encounter (Signed)
Judson Roch called from [place] regarding prior auth for pt  ? ? ?Prolia request was denied on 3/8 so this request will be cancelled. Pt is to follow appeal process  ? ? ?[place] [phone] ext [phone] ?

## 2021-08-16 NOTE — Telephone Encounter (Signed)
Appeal letter drafted.

## 2021-08-19 NOTE — Telephone Encounter (Signed)
See below

## 2021-08-19 NOTE — Telephone Encounter (Signed)
See prior Prolia VOB encounter ?

## 2021-09-04 NOTE — Telephone Encounter (Signed)
Appeal letter sent to PCP to review.  ?

## 2021-10-23 ENCOUNTER — Telehealth: Payer: Self-pay | Admitting: Family Medicine

## 2021-10-23 NOTE — Telephone Encounter (Signed)
Darlina Rumpf [facility] PAs) called stating that the appeal paperwork sent for pt prolia is no longer valid and is wondering if pt wants to continue with prolia or one of the meds that are approved by [facility]. Maudie Mercury needs a decision by the end of the business day.

## 2021-10-23 NOTE — Telephone Encounter (Signed)
Prior Authorization initiated for [facility] via [facility] Provider portal.  Case ID: [id number]

## 2021-10-28 NOTE — Telephone Encounter (Signed)
PA# [id number] Valid: 10/23/21-10/24/22

## 2021-10-28 NOTE — Telephone Encounter (Signed)
Pt ready for scheduling on or after 10/23/21  Out-of-pocket cost due at time of visit: $306  Primary: [employer] Medicare Prolia co-insurance: 20% (approximately $276) Admin fee co-insurance: $30  Secondary: n/a Prolia co-insurance:  Admin fee co-insurance:   Deductible: does not apply  Prior Auth: APPROVED PA# [id number] Valid: 10/23/21-10/24/22   ** This summary of benefits is an estimation of the patient's out-of-pocket cost. Exact cost may vary based on individual plan coverage.

## 2021-10-29 ENCOUNTER — Other Ambulatory Visit: Payer: Self-pay | Admitting: Family Medicine

## 2021-10-29 DIAGNOSIS — E785 Hyperlipidemia, unspecified: Secondary | ICD-10-CM

## 2021-11-04 NOTE — Telephone Encounter (Signed)
Pt will get this at her 12/18/21 appointment.

## 2021-11-25 NOTE — Progress Notes (Signed)
Cardiology Office Note   Date:  11/26/2021   ID:  Tina [name], DOB 23-Oct-1945, MRN [id number]  PCP:  Carollee Herter, Alferd Apa, DO  Cardiologist:   Dorris Carnes, MD  Pt presents for f/u of CAD   History of Present Illness: Tina [name] is a 76 y.o. female with a history of hypertension, hyperlipidemia, goiter and CAD (s/p NSTEMI in 2004 with PTCA/stent to LAD).  I saw the pt  in [place] in Feb 2022   Since seen she has done well from a cardiac standpont   Breathing is good   Deneis CP  No dizziness    No palpitations    Walks 10,000 steps per day  Diet:  Breakfast  Muffin, 5 grapes Lunch   varies Dinner   Small     Current Meds  Medication Sig   alendronate (FOSAMAX) 70 MG tablet TAKE 1 TABLET BY MOUTH 1 TIME WEEKLY ON AN EMPTY STOMACH AND WITH FULL GLASS OF WATER   aspirin 81 MG tablet Take 81 mg by mouth daily.   Calcium Carbonate-Vit D-Min (CALCIUM 1200 PO) Take 1,200 mg by mouth daily. 1 tab po qd   Multiple Vitamin (MULTIVITAMIN) capsule Take 1 capsule by mouth daily.   Omega-3 Fatty Acids (FISH OIL) 1000 MG CAPS Take 2,000 mg by mouth daily.   ramipril (ALTACE) 5 MG capsule TAKE 1 CAPSULE(5 MG) BY MOUTH DAILY   rosuvastatin (CRESTOR) 20 MG tablet TAKE 1/2 TABLET(10 MG) BY MOUTH DAILY     Allergies:   Patient has no known allergies.   Past Medical History:  Diagnosis Date   Allergy    seasonal   Cataract    removed both eyes   Fatty liver    Goiter    treated   HTN (hypertension)    Hyperlipidemia    Myocardial infarct ([place]) hx of 2004   NSTEMI with stent   Osteoporosis    Patellar fracture    Sleep apnea    no cpap   Wrist fracture    right wrist    Past Surgical History:  Procedure Laterality Date   CARDIAC CATHETERIZATION  2004   stent   CATARACT EXTRACTION, BILATERAL  05/2020   COLONOSCOPY     POLYPECTOMY     PROCTOSCOPY N/A 07/12/2019   Procedure: RIGID PROCTOSCOPY;  Surgeon: Michael Boston, MD;  Location: [facility];  Service: General;  Laterality:  N/A;   sleep study  2012     Social History:  The patient  reports that she quit smoking about 18 years ago. Her smoking use included cigarettes. She has a 30.00 pack-year smoking history. She quit smokeless tobacco use about 19 years ago. She reports current alcohol use. She reports that she does not use drugs.   Family History:  The patient's family history includes Arthritis in her brother; Cancer (age of onset: 65) in her sister; Cancer (age of onset: 1) in her father; Coronary artery disease in an other family member; Diabetes in her maternal grandmother; Heart disease in her father; Heart disease (age of onset: 43) in her mother; Hypertension in her brother, father, mother, and sister; Lung cancer in an other family member; Ovarian cancer in her mother and another family member.    ROS:  Please see the history of present illness. All other systems are reviewed and  Negative to the above problem except as noted.    PHYSICAL EXAM: VS:  BP 130/80   Pulse (!) 51   Ht  $'5\' 5"'Y$  (1.651 m)   Wt 157 lb 3.2 oz (71.3 kg)   SpO2 93%   BMI 26.16 kg/m   GEN:    pt is in no acute distress  HEENT: normal  Neck: no JVD, carotid bruits Cardiac: RRR; no murmurs,  No LE edema  Respiratory:  clear to auscultation bilaterally GI: soft, nontender, nondistended, + BS  No hepatomegaly  MS: no deformity Moving all extremities   Skin: warm and dry, no rash Neuro:  Strength and sensation are intact Psych: euthymic mood, full affect   EKG:  EKG is ordered today.  Sinus bradycardia 51 bpm.    Lipid Panel    Component Value Date/Time   CHOL 190 06/05/2021 1015   TRIG 65.0 06/05/2021 1015   HDL 84.10 06/05/2021 1015   CHOLHDL 2 06/05/2021 1015   VLDL 13.0 06/05/2021 1015   LDLCALC 93 06/05/2021 1015   LDLDIRECT 123.7 05/30/2013 0811      Wt Readings from Last 3 Encounters:  11/26/21 157 lb 3.2 oz (71.3 kg)  06/19/21 160 lb 3.2 oz (72.7 kg)  06/05/21 160 lb (72.6 kg)      ASSESSMENT AND  PLAN:  1.  CAD Pt doing good   NO symptms of angina  Active     2  HL    Will get lipomed, Lpa and Apo B    LDL needs to be tighter     3  HTN  BP is good     Continue meds     Plan for f/u in 1 year   Sooner for problems    Current medicines are reviewed at length with the patient today.  The patient does not have concerns regarding medicines.  Signed, Dorris Carnes, MD  11/26/2021 1:51 PM    [facility] [place], [place], [place]  [address] Phone: [phone]; Fax: [phone]

## 2021-11-26 ENCOUNTER — Encounter: Payer: Self-pay | Admitting: Internal Medicine

## 2021-11-26 ENCOUNTER — Ambulatory Visit: Payer: Medicare Other | Admitting: Internal Medicine

## 2021-11-26 VITALS — BP 130/80 | HR 51 | Ht 65.0 in | Wt 157.2 lb

## 2021-11-26 DIAGNOSIS — E782 Mixed hyperlipidemia: Secondary | ICD-10-CM

## 2021-11-26 DIAGNOSIS — Z79899 Other long term (current) drug therapy: Secondary | ICD-10-CM | POA: Diagnosis not present

## 2021-11-26 DIAGNOSIS — I10 Essential (primary) hypertension: Secondary | ICD-10-CM | POA: Diagnosis not present

## 2021-11-26 NOTE — Patient Instructions (Signed)
Medication Instructions:   *If you need a refill on your cardiac medications before your next appointment, please call your pharmacy*   Lab Work: [place]  If you have labs (blood work) drawn today and your tests are completely normal, you will receive your results only by: [place] (if you have MyChart) OR A paper copy in the mail If you have any lab test that is abnormal or we need to change your treatment, we will call you to review the results.   Testing/Procedures:    Follow-Up: At [facility], you and your health needs are our priority.  As part of our continuing mission to provide you with exceptional heart care, we have created designated Provider Care Teams.  These Care Teams include your primary Cardiologist (physician) and Advanced Practice Providers (APPs -  Physician Assistants and Nurse Practitioners) who all work together to provide you with the care you need, when you need it.  We recommend signing up for the patient portal called "MyChart".  Sign up information is provided on this After Visit Summary.  MyChart is used to connect with patients for Virtual Visits (Telemedicine).  Patients are able to view lab/test results, encounter notes, upcoming appointments, etc.  Non-urgent messages can be sent to your provider as well.   To learn more about what you can do with MyChart, go to [email or web address].    Your next appointment:   1 year(s)  The format for your next appointment:   In Person  Provider:   DR. Dorris Carnes  If primary card or EP is not listed click here to update    :1}    Other Instructions   Important Information About Sugar

## 2021-11-27 ENCOUNTER — Telehealth: Payer: Self-pay

## 2021-11-27 DIAGNOSIS — E782 Mixed hyperlipidemia: Secondary | ICD-10-CM

## 2021-11-27 DIAGNOSIS — Z79899 Other long term (current) drug therapy: Secondary | ICD-10-CM

## 2021-11-27 LAB — NMR, LIPOPROFILE
Cholesterol, Total: 175 mg/dL (ref 100–199)
HDL Particle Number: 44.5 umol/L (ref >=30.5)
HDL-C: 97 mg/dL (ref >39)
LDL Particle Number: 612 nmol/L (ref <1000)
LDL Size: 21 nm (ref >20.5)
LDL-C (NIH Calc): 64 mg/dL (ref 0–99)
LP-IR Score: 31 (ref <=45)
Small LDL Particle Number: <90 nmol/L (ref <=527)
Triglycerides: 74 mg/dL (ref 0–149)

## 2021-11-27 LAB — APOLIPOPROTEIN B: Apolipoprotein B: 70 mg/dL (ref <90)

## 2021-11-27 LAB — LIPOPROTEIN A (LPA): Lipoprotein (a): 222 nmol/L — ABNORMAL HIGH (ref <75.0)

## 2021-11-27 MED ORDER — ROSUVASTATIN CALCIUM 20 MG PO TABS: 20.0000 mg | ORAL_TABLET | Freq: Every day | ORAL | 3 refills | Status: DC

## 2021-11-27 NOTE — Telephone Encounter (Signed)
-----   Message from Fay Records, MD sent at 11/27/2021 10:55 AM EDT ----- LDL is good at 64  But, with Lpa being so high (protein that carries LDL) would recomm lowrering LDL further    I would increase Crestor to 10 mg  Follow up lipomed in 10 wks

## 2021-11-27 NOTE — Telephone Encounter (Signed)
My Chart message sent to the pt... need 10 week lab date.

## 2021-11-30 ENCOUNTER — Encounter: Payer: Self-pay | Admitting: Internal Medicine

## 2021-12-06 ENCOUNTER — Other Ambulatory Visit: Payer: Self-pay | Admitting: Family Medicine

## 2021-12-06 DIAGNOSIS — I10 Essential (primary) hypertension: Secondary | ICD-10-CM

## 2021-12-15 ENCOUNTER — Telehealth: Payer: Self-pay

## 2021-12-15 ENCOUNTER — Ambulatory Visit (INDEPENDENT_AMBULATORY_CARE_PROVIDER_SITE_OTHER): Payer: Medicare Other | Admitting: Family Medicine

## 2021-12-15 ENCOUNTER — Encounter: Payer: Self-pay | Admitting: Family Medicine

## 2021-12-15 VITALS — BP 120/72 | HR 53 | Temp 98.1°F | Resp 18 | Ht 65.0 in | Wt 156.8 lb

## 2021-12-15 DIAGNOSIS — E785 Hyperlipidemia, unspecified: Secondary | ICD-10-CM | POA: Diagnosis not present

## 2021-12-15 DIAGNOSIS — I10 Essential (primary) hypertension: Secondary | ICD-10-CM

## 2021-12-15 DIAGNOSIS — R252 Cramp and spasm: Secondary | ICD-10-CM

## 2021-12-15 LAB — COMPREHENSIVE METABOLIC PANEL
ALT: 13 U/L (ref 0–35)
AST: 24 U/L (ref 0–37)
Albumin: 4.6 g/dL (ref 3.5–5.2)
Alkaline Phosphatase: 33 U/L — ABNORMAL LOW (ref 39–117)
BUN: 12 mg/dL (ref 6–23)
CO2: 30 mEq/L (ref 19–32)
Calcium: 9.6 mg/dL (ref 8.4–10.5)
Chloride: 98 mEq/L (ref 96–112)
Creatinine, Ser: 0.59 mg/dL (ref 0.40–1.20)
GFR: 88.55 mL/min (ref >60.00)
Glucose, Bld: 84 mg/dL (ref 70–99)
Potassium: 4.3 mEq/L (ref 3.5–5.1)
Sodium: 137 mEq/L (ref 135–145)
Total Bilirubin: 0.8 mg/dL (ref 0.2–1.2)
Total Protein: 6.7 g/dL (ref 6.0–8.3)

## 2021-12-15 LAB — PHOSPHORUS: Phosphorus: 3.8 mg/dL (ref 2.3–4.6)

## 2021-12-15 LAB — MAGNESIUM: Magnesium: 2 mg/dL (ref 1.5–2.5)

## 2021-12-15 NOTE — Telephone Encounter (Signed)
[facility] VOB initiated via [email or web address]  [facility] VS PROLIA

## 2021-12-15 NOTE — Telephone Encounter (Signed)
Pt will get Prolia if [employer] is not covered.

## 2021-12-15 NOTE — Progress Notes (Signed)
Subjective:   By signing my name below, I, Tina [name], attest that this documentation has been prepared under the direction and in the presence of Tina Held DO 12/15/2021    Patient ID: Tina [name], female    DOB: December 13, 1945, 76 y.o.   MRN: [id number]  Chief Complaint  Patient presents with   Hypertension   Hyperlipidemia   Follow-up    HPI Patient is in today for an office visit  She reports of chronic cramps in her legs. Also occasionally, her fingers spasm and get stuck. She states that symptoms are not every night and is inconsistent. She currently takes a Multivitamin and a calcium medication. She also takes it with mustard. She does about   She is scheduled to receive Prolia Injections. She is inquiring about the difference between Evenity and Prolia injections. However, she wouldn't mind using Prolia injections once a month however, the price is steep for her given the frequency of injections.   Dr. Harrington Challenger recently checked her cholesterol. She changed her 20 Mg of Crestor from 1/2 tablet to 1 tablet daily.   Past Medical History:  Diagnosis Date   Allergy    seasonal   Cataract    removed both eyes   Fatty liver    Goiter    treated   HTN (hypertension)    Hyperlipidemia    Myocardial infarct ([place]) hx of 2004   NSTEMI with stent   Osteoporosis    Patellar fracture    Sleep apnea    no cpap   Wrist fracture    right wrist    Past Surgical History:  Procedure Laterality Date   CARDIAC CATHETERIZATION  2004   stent   CATARACT EXTRACTION, BILATERAL  05/2020   COLONOSCOPY     POLYPECTOMY     PROCTOSCOPY N/A 07/12/2019   Procedure: RIGID PROCTOSCOPY;  Surgeon: Michael Boston, MD;  Location: [facility];  Service: General;  Laterality: N/A;   sleep study  2012    Family History  Problem Relation Age of Onset   Hypertension Mother    Heart disease Mother 37       MI--- died from #3 at 81   Ovarian cancer Mother    Hypertension Father    Cancer  Father 41       ovarian   Heart disease Father    Hypertension Sister    Cancer Sister 85       brain tumor   Coronary artery disease Other    Lung cancer Other    Ovarian cancer Other    Diabetes Maternal Grandmother    Hypertension Brother    Arthritis Brother    Colon cancer Neg Hx    Colon polyps Neg Hx    Esophageal cancer Neg Hx    Stomach cancer Neg Hx    Rectal cancer Neg Hx     Social History   Socioeconomic History   Marital status: Divorced    Spouse name: Not on file   Number of children: Not on file   Years of education: Not on file   Highest education level: Not on file  Occupational History   Occupation: nurse    Employer: OTHER    Comment: [employer]   Tobacco Use   Smoking status: Former    Packs/day: 1.00    Years: 30.00    Total pack years: 30.00    Types: Cigarettes    Quit date: 12/13/2002    Years since  quitting: 19.0   Smokeless tobacco: Former    Quit date: 06/01/2002  Vaping Use   Vaping Use: Never used  Substance and Sexual Activity   Alcohol use: Yes    Comment: 1-2 glasses of wine with dinner   Drug use: No   Sexual activity: Not Currently    Partners: Male  Other Topics Concern   Not on file  Social History Narrative   Occupation:[employer]   Divorced   Former Smoker quit 2004   Drug use-no   Regular exercise-treadmill 3x a week   Originially from [place] Determinants of [employer] Strain: Low Risk  (03/18/2021)   Overall Financial Resource Strain (CARDIA)    Difficulty of Paying Living Expenses: Not hard at all  Food Insecurity: No Food Insecurity (03/18/2021)   Hunger Vital Sign    Worried About Running Out of Food in the Last Year: Never true    Ran Out of Food in the Last Year: Never true  Transportation Needs: No Transportation Needs (03/18/2021)   PRAPARE - Hydrologist (Medical): No    Lack of Transportation (Non-Medical): No  Physical Activity:  Sufficiently Active (03/18/2021)   Exercise Vital Sign    Days of Exercise per Week: 7 days    Minutes of Exercise per Session: 30 min  Stress: No Stress Concern Present (03/18/2021)   [place]    Feeling of Stress : Not at all  Social Connections: Socially Isolated (03/18/2021)   Social Connection and Isolation Panel [NHANES]    Frequency of Communication with Friends and Family: Once a week    Frequency of Social Gatherings with Friends and Family: Once a week    Attends Religious Services: Never    Marine scientist or Organizations: No    Attends Archivist Meetings: Never    Marital Status: Divorced  Human resources officer Violence: Not At Risk (03/18/2021)   Humiliation, Afraid, Rape, and Kick questionnaire    Fear of Current or Ex-Partner: No    Emotionally Abused: No    Physically Abused: No    Sexually Abused: No    Outpatient Medications Prior to Visit  Medication Sig Dispense Refill   alendronate (FOSAMAX) 70 MG tablet TAKE 1 TABLET BY MOUTH 1 TIME WEEKLY ON AN EMPTY STOMACH AND WITH FULL GLASS OF WATER 12 tablet 1   aspirin 81 MG tablet Take 81 mg by mouth daily.     Calcium Carbonate-Vit D-Min (CALCIUM 1200 PO) Take 1,200 mg by mouth daily. 1 tab po qd     Multiple Vitamin (MULTIVITAMIN) capsule Take 1 capsule by mouth daily.     Omega-3 Fatty Acids (FISH OIL) 1000 MG CAPS Take 2,000 mg by mouth daily.     ramipril (ALTACE) 5 MG capsule TAKE 1 CAPSULE(5 MG) BY MOUTH DAILY 90 capsule 1   rosuvastatin (CRESTOR) 20 MG tablet Take 1 tablet (20 mg total) by mouth daily. 90 tablet 3   No facility-administered medications prior to visit.    No Known Allergies  Review of Systems  Constitutional:  Negative for fever and malaise/fatigue.  HENT:  Negative for congestion.   Eyes:  Negative for blurred vision.  Respiratory:  Negative for shortness of breath.   Cardiovascular:  Negative for chest  pain, palpitations and leg swelling.  Gastrointestinal:  Negative for abdominal pain, blood in stool and nausea.  Genitourinary:  Negative for  dysuria and frequency.  Musculoskeletal:  Negative for falls.  Skin:  Negative for rash.  Neurological:  Negative for dizziness, loss of consciousness and headaches.  Endo/Heme/Allergies:  Negative for environmental allergies.  Psychiatric/Behavioral:  Negative for depression. The patient is not nervous/anxious.        Objective:    Physical Exam Vitals and nursing note reviewed.  Constitutional:      General: She is not in acute distress.    Appearance: Normal appearance. She is not ill-appearing.  HENT:     Head: Normocephalic and atraumatic.     Right Ear: External ear normal.     Left Ear: External ear normal.  Eyes:     Extraocular Movements: Extraocular movements intact.     Pupils: Pupils are equal, round, and reactive to light.  Cardiovascular:     Rate and Rhythm: Normal rate and regular rhythm.     Heart sounds: Normal heart sounds. No murmur heard.    No gallop.  Pulmonary:     Effort: Pulmonary effort is normal. No respiratory distress.     Breath sounds: Normal breath sounds. No wheezing or rales.  Skin:    General: Skin is warm and dry.  Neurological:     Mental Status: She is alert and oriented to person, place, and time.  Psychiatric:        Judgment: Judgment normal.     BP 120/72 (BP Location: Left Arm, Patient Position: Sitting, Cuff Size: Normal)   Pulse (!) 53   Temp 98.1 F (36.7 C) (Oral)   Resp 18   Ht '5\' 5"'$  (1.651 m)   Wt 156 lb 12.8 oz (71.1 kg)   SpO2 96%   BMI 26.09 kg/m  Wt Readings from Last 3 Encounters:  12/15/21 156 lb 12.8 oz (71.1 kg)  11/26/21 157 lb 3.2 oz (71.3 kg)  06/19/21 160 lb 3.2 oz (72.7 kg)    Diabetic Foot Exam - Simple   No data filed    Lab Results  Component Value Date   WBC 6.1 06/05/2021   HGB 13.2 06/05/2021   HCT 38.9 06/05/2021   PLT 278.0 06/05/2021    GLUCOSE 84 12/15/2021   CHOL 190 06/05/2021   TRIG 65.0 06/05/2021   HDL 84.10 06/05/2021   LDLDIRECT 123.7 05/30/2013   LDLCALC 93 06/05/2021   ALT 13 12/15/2021   AST 24 12/15/2021   NA 137 12/15/2021   K 4.3 12/15/2021   CL 98 12/15/2021   CREATININE 0.59 12/15/2021   BUN 12 12/15/2021   CO2 30 12/15/2021   TSH 1.26 04/06/2019   HGBA1C 5.4 07/10/2019    Lab Results  Component Value Date   TSH 1.26 04/06/2019   Lab Results  Component Value Date   WBC 6.1 06/05/2021   HGB 13.2 06/05/2021   HCT 38.9 06/05/2021   MCV 93.2 06/05/2021   PLT 278.0 06/05/2021   Lab Results  Component Value Date   NA 137 12/15/2021   K 4.3 12/15/2021   CO2 30 12/15/2021   GLUCOSE 84 12/15/2021   BUN 12 12/15/2021   CREATININE 0.59 12/15/2021   BILITOT 0.8 12/15/2021   ALKPHOS 33 (L) 12/15/2021   AST 24 12/15/2021   ALT 13 12/15/2021   PROT 6.7 12/15/2021   ALBUMIN 4.6 12/15/2021   CALCIUM 9.6 12/15/2021   ANIONGAP 7 07/13/2019   GFR 88.55 12/15/2021   Lab Results  Component Value Date   CHOL 190 06/05/2021   Lab Results  Component Value  Date   HDL 84.10 06/05/2021   Lab Results  Component Value Date   LDLCALC 93 06/05/2021   Lab Results  Component Value Date   TRIG 65.0 06/05/2021   Lab Results  Component Value Date   CHOLHDL 2 06/05/2021   Lab Results  Component Value Date   HGBA1C 5.4 07/10/2019       Assessment & Plan:   Problem List Items Addressed This Visit       Unprioritized   Essential hypertension   Primary hypertension    Well controlled, no changes to meds. Encouraged heart healthy diet such as the DASH diet and exercise as tolerated.       Hyperlipidemia    Tolerating statin, encouraged heart healthy diet, avoid trans fats, minimize simple carbs and saturated fats. Increase exercise as tolerated      Other Visit Diagnoses     Muscle cramps    -  Primary   Relevant Orders   Comprehensive metabolic panel (Completed)   Magnesium  (Completed)   Phosphorus (Completed)        No orders of the defined types were placed in this encounter.   IAnn Held, DO, personally preformed the services described in this documentation.  All medical record entries made by the scribe were at my direction and in my presence.  I have reviewed the chart and discharge instructions (if applicable) and agree that the record reflects my personal performance and is accurate and complete. 12/15/2021   I,Amber Collins,acting as a scribe for [employer], DO.,have documented all relevant documentation on the behalf of Tina Held, DO,as directed by  Tina Held, DO while in the presence of Tina Held, DO.   Tina Held, DO

## 2021-12-15 NOTE — Assessment & Plan Note (Signed)
Tolerating statin, encouraged heart healthy diet, avoid trans fats, minimize simple carbs and saturated fats. Increase exercise as tolerated 

## 2021-12-15 NOTE — Patient Instructions (Signed)
[place] --- leg cramps    Leg Cramps Leg cramps occur when one or more muscles tighten and a person has no control over it (involuntary muscle contraction). Muscle cramps are most common in the calf muscles of the leg. They can occur during exercise or at rest. Leg cramps are painful, and they may last for a few seconds to a few minutes. Cramps may return several times before they finally stop. Usually, leg cramps are not caused by a serious medical problem. In many cases, the cause is not known. Some common causes include: Excessive physical effort (overexertion), such as during intense exercise. Doing the same motion over and over. Staying in a certain position for a long period of time. Improper preparation, form, or technique while doing a sport or an activity. Dehydration. Injury. Side effects of certain medicines. Abnormally low levels of minerals in your blood (electrolytes), especially potassium and calcium. This could result from: Pregnancy. Taking diuretic medicines. Follow these instructions at home: Eating and drinking Drink enough fluid to keep your urine pale yellow. Staying hydrated may help prevent cramps. Eat a healthy diet that includes plenty of nutrients to help your muscles function. A healthy diet includes fruits and vegetables, lean protein, whole grains, and low-fat or nonfat dairy products. Managing pain, stiffness, and swelling     Try massaging, stretching, and relaxing the affected muscle. Do this for several minutes at a time. If directed, put ice on areas that are sore or painful after a cramp. To do this: Put ice in a plastic bag. Place a towel between your skin and the bag. Leave the ice on for 20 minutes, 2-3 times a day. Remove the ice if your skin turns bright red. This is very important. If you cannot feel pain, heat, or cold, you have a greater risk of damage to the area. If directed, apply heat to muscles that are tense or tight. Do this before you  exercise, or as often as told by your health care provider. Use the heat source that your health care provider recommends, such as a moist heat pack or a heating pad. To do this: Place a towel between your skin and the heat source. Leave the heat on for 20-30 minutes. Remove the heat if your skin turns bright red. This is especially important if you are unable to feel pain, heat, or cold. You may have a greater risk of getting burned. Try taking hot showers or baths to help relax tight muscles. General instructions If you are having frequent leg cramps, avoid intense exercise for several days. Take over-the-counter and prescription medicines only as told by your health care provider. Keep all follow-up visits. This is important. Contact a health care provider if: Your leg cramps get more severe or more frequent, or they do not improve over time. Your foot becomes cold, numb, or blue. Summary Muscle cramps can develop in any muscle, but the most common place is in the calf muscles of the leg. Leg cramps are painful, and they may last for a few seconds to a few minutes. Usually, leg cramps are not caused by a serious medical problem. Often, the cause is not known. Stay hydrated, and take over-the-counter and prescription medicines only as told by your health care provider. This information is not intended to replace advice given to you by your health care provider. Make sure you discuss any questions you have with your health care provider. Document Revised: 10/04/2019 Document Reviewed: 10/04/2019 Elsevier Patient Education  2023 Elsevier Inc.  

## 2021-12-15 NOTE — Telephone Encounter (Signed)
-----   Message from Sanda Linger, [place] sent at 12/15/2021 11:08 AM EDT -----  ----- Message ----- From: Ann Held, DO Sent: 12/15/2021  10:59 AM EDT To: Sanda Linger, CMA  Pt would like to see if her ins will cover the evenity-----  if not she will do prolia

## 2021-12-15 NOTE — Assessment & Plan Note (Signed)
Well controlled, no changes to meds. Encouraged heart healthy diet such as the DASH diet and exercise as tolerated.  °

## 2021-12-18 ENCOUNTER — Ambulatory Visit: Payer: Medicare Other | Admitting: Family Medicine

## 2021-12-21 NOTE — Telephone Encounter (Signed)
Prior auth required for EVENITY  PA PROCESS DETAILS: Prior Authorization is Required. We are unable to confirm if a PA is on file. Please check your records or contact the payer.  

## 2021-12-21 NOTE — Telephone Encounter (Signed)
Prior Authorization initiated for [facility] via [facility] Provider portal.  Case ID: [id number]

## 2021-12-24 NOTE — Telephone Encounter (Signed)
Pt ready for scheduling on or after 12/21/21  Out-of-pocket cost due at time of visit: $448  Primary: [place] Medicare Adv Evenity co-insurance: 20% (approximately $418) Admin fee co-insurance: $30  Secondary: n/a Evenity co-insurance:  Admin fee co-insurance:   Deductible: does not apply  Prior Auth: APPROVED PA# [id number] Valid: 12/21/21-12/22/22  ** This summary of benefits is an estimation of the patient's out-of-pocket cost. Exact cost may vary based on individual plan coverage.

## 2021-12-24 NOTE — Telephone Encounter (Signed)
PA# [id number] Valid: 12/21/21-12/22/22

## 2021-12-30 NOTE — Telephone Encounter (Signed)
Evenity for this pt will be $448/ month x 12 months.   We can check with her and see if she would like to pay this for go ahead and check cost of Prolia.   Please advise.

## 2022-01-01 NOTE — Telephone Encounter (Signed)
My Chart message sent

## 2022-01-01 NOTE — Telephone Encounter (Signed)
Pt says she would be responsible for $100 only. Also, does [facility] even have a generic?

## 2022-01-01 NOTE — Telephone Encounter (Signed)
Considering Evenity as alternative to Prolia.

## 2022-01-14 DIAGNOSIS — H26493 Other secondary cataract, bilateral: Secondary | ICD-10-CM | POA: Diagnosis not present

## 2022-01-14 DIAGNOSIS — H524 Presbyopia: Secondary | ICD-10-CM | POA: Diagnosis not present

## 2022-01-14 DIAGNOSIS — H40023 Open angle with borderline findings, high risk, bilateral: Secondary | ICD-10-CM | POA: Diagnosis not present

## 2022-01-14 DIAGNOSIS — H02423 Myogenic ptosis of bilateral eyelids: Secondary | ICD-10-CM | POA: Diagnosis not present

## 2022-01-28 NOTE — Telephone Encounter (Addendum)
Evenity Injection Schedule Inj #1 - 01/30/22 Inj #2 - 03/03/22 Inj #3 - scheduled 04/03/22 Inj #4  Inj #5  Inj #6  Inj #7  Inj #8  Inj #9  Inj #10  Inj #11  Inj #12

## 2022-01-30 ENCOUNTER — Encounter: Payer: Self-pay | Admitting: Family Medicine

## 2022-01-30 ENCOUNTER — Ambulatory Visit (INDEPENDENT_AMBULATORY_CARE_PROVIDER_SITE_OTHER): Payer: Medicare Other

## 2022-01-30 DIAGNOSIS — Z79899 Other long term (current) drug therapy: Secondary | ICD-10-CM

## 2022-01-30 DIAGNOSIS — M810 Age-related osteoporosis without current pathological fracture: Secondary | ICD-10-CM

## 2022-01-30 DIAGNOSIS — E782 Mixed hyperlipidemia: Secondary | ICD-10-CM

## 2022-01-30 MED ORDER — ROMOSOZUMAB-AQQG 105 MG/1.17ML ~~LOC~~ SOSY
210.0000 mg | PREFILLED_SYRINGE | Freq: Once | SUBCUTANEOUS | Status: AC
  Administered 2022-01-30: 210 mg via SUBCUTANEOUS

## 2022-01-30 NOTE — Progress Notes (Signed)
Pt here today for #1 monthly Evenity injection per Dr. Etter Sjogren.   Evenity '210mg'$ /2.77m (full dose) injected into L arm SUBQ. Pt tolerated injection well.    Next in 1 month.

## 2022-02-04 NOTE — Telephone Encounter (Signed)
Confirmed with RN, Bourn at Dr Alan Ripper office that [place] lipoprofile is all that is needed at this time. Test ordered under PCP name for future lab appt.

## 2022-02-06 ENCOUNTER — Other Ambulatory Visit (INDEPENDENT_AMBULATORY_CARE_PROVIDER_SITE_OTHER): Payer: Medicare Other

## 2022-02-06 DIAGNOSIS — Z79899 Other long term (current) drug therapy: Secondary | ICD-10-CM

## 2022-02-06 DIAGNOSIS — E782 Mixed hyperlipidemia: Secondary | ICD-10-CM

## 2022-02-07 LAB — NMR, LIPOPROFILE
Cholesterol, Total: 175 mg/dL (ref 100–199)
HDL Particle Number: 43.8 umol/L (ref >=30.5)
HDL-C: 91 mg/dL (ref >39)
LDL Particle Number: 723 nmol/L (ref <1000)
LDL Size: 21 nm (ref >20.5)
LDL-C (NIH Calc): 72 mg/dL (ref 0–99)
LP-IR Score: <25 (ref <=45)
Small LDL Particle Number: <90 nmol/L (ref <=527)
Triglycerides: 65 mg/dL (ref 0–149)

## 2022-02-10 ENCOUNTER — Other Ambulatory Visit: Payer: Medicare Other

## 2022-03-03 ENCOUNTER — Telehealth: Payer: Self-pay | Admitting: *Deleted

## 2022-03-03 ENCOUNTER — Ambulatory Visit (INDEPENDENT_AMBULATORY_CARE_PROVIDER_SITE_OTHER): Payer: Medicare Other | Admitting: *Deleted

## 2022-03-03 DIAGNOSIS — M810 Age-related osteoporosis without current pathological fracture: Secondary | ICD-10-CM | POA: Diagnosis not present

## 2022-03-03 MED ORDER — ROMOSOZUMAB-AQQG 105 MG/1.17ML ~~LOC~~ SOSY
210.0000 mg | PREFILLED_SYRINGE | Freq: Once | SUBCUTANEOUS | Status: AC
  Administered 2022-03-03: 210 mg via SUBCUTANEOUS

## 2022-03-03 NOTE — Telephone Encounter (Signed)
Patient had Evenity today.

## 2022-03-03 NOTE — Telephone Encounter (Signed)
Left message on machine for patient to call back to schedule for nurse visit next month (on or around 04/03/22) for Evenity injection.

## 2022-03-03 NOTE — Progress Notes (Signed)
Pt here today for #1 monthly Evenity injection per Dr. Etter Sjogren.    Evenity '210mg'$ /2.16m (full dose) injected into L arm SUBQ. Pt tolerated injection well.  Patient ok to schedule for next month.

## 2022-03-20 ENCOUNTER — Ambulatory Visit: Payer: Medicare Other

## 2022-03-23 ENCOUNTER — Ambulatory Visit (INDEPENDENT_AMBULATORY_CARE_PROVIDER_SITE_OTHER): Payer: Medicare Other | Admitting: *Deleted

## 2022-03-23 DIAGNOSIS — Z0000 Encounter for general adult medical examination without abnormal findings: Secondary | ICD-10-CM | POA: Diagnosis not present

## 2022-03-23 NOTE — Progress Notes (Signed)
Subjective:   Tina [name] is a 76 y.o. female who presents for Medicare Annual (Subsequent) preventive examination.  I connected with  Amarea E Ledo on 03/23/22 by a audio enabled telemedicine application and verified that I am speaking with the correct person using two identifiers.  Patient Location: Home  Provider Location: Office/Clinic  I discussed the limitations of evaluation and management by telemedicine. The patient expressed understanding and agreed to proceed.   Review of Systems    Defer to PCP Cardiac Risk Factors include: advanced age (>49mn, >>41women);dyslipidemia;hypertension     Objective:    There were no vitals filed for this visit. There is no height or weight on file to calculate BMI.     03/23/2022    9:42 AM 07/03/2021    9:29 AM 05/19/2021    4:49 PM 03/18/2021    3:51 PM 07/12/2019    4:30 PM 07/12/2019   10:52 AM 07/10/2019   10:17 AM  Advanced Directives  Does Patient Have a Medical Advance Directive? No No No No No No No  Would patient like information on creating a medical advance directive? No - Patient declined No - Patient declined No - Patient declined No - Patient declined No - Patient declined No - Patient declined     Current Medications (verified) Outpatient Encounter Medications as of 03/23/2022  Medication Sig   alendronate (FOSAMAX) 70 MG tablet TAKE 1 TABLET BY MOUTH 1 TIME WEEKLY ON AN EMPTY STOMACH AND WITH FULL GLASS OF WATER   aspirin 81 MG tablet Take 81 mg by mouth daily.   Calcium Carbonate-Vit D-Min (CALCIUM 1200 PO) Take 1,200 mg by mouth daily. 1 tab po qd   Multiple Vitamin (MULTIVITAMIN) capsule Take 1 capsule by mouth daily.   Omega-3 Fatty Acids (FISH OIL) 1000 MG CAPS Take 2,000 mg by mouth daily.   ramipril (ALTACE) 5 MG capsule TAKE 1 CAPSULE(5 MG) BY MOUTH DAILY   rosuvastatin (CRESTOR) 20 MG tablet Take 1 tablet (20 mg total) by mouth daily.   No facility-administered encounter medications on file as of  03/23/2022.    Allergies (verified) Patient has no known allergies.   History: Past Medical History:  Diagnosis Date   Allergy    seasonal   Cataract    removed both eyes   Fatty liver    Goiter    treated   HTN (hypertension)    Hyperlipidemia    [place]   NSTEMI with stent   Osteoporosis    Patellar fracture    Sleep apnea    no cpap   Wrist fracture    right wrist   Past Surgical History:  Procedure Laterality Date   CARDIAC CATHETERIZATION  2004   stent   CATARACT EXTRACTION, BILATERAL  05/2020   COLONOSCOPY     POLYPECTOMY     PROCTOSCOPY N/A 07/12/2019   Procedure: RIGID PROCTOSCOPY;  Surgeon: GMichael Boston MD;  Location: [facility];  Service: General;  Laterality: N/A;   sleep study  2012   Family History  Problem Relation Age of Onset   Hypertension Mother    Heart disease Mother 532      MI--- died from #3 at 548  Ovarian cancer Mother    Hypertension Father    Cancer Father 659      ovarian   Heart disease Father    Hypertension Sister    Cancer Sister 450  brain tumor   Coronary artery disease Other    Lung cancer Other    Ovarian cancer Other    Diabetes Maternal Grandmother    Hypertension Brother    Arthritis Brother    Colon cancer Neg Hx    Colon polyps Neg Hx    Esophageal cancer Neg Hx    Stomach cancer Neg Hx    Rectal cancer Neg Hx    Social History   Socioeconomic History   Marital status: Divorced    Spouse name: Not on file   Number of children: Not on file   Years of education: Not on file   Highest education level: Not on file  Occupational History   Occupation: nurse    Employer: OTHER    Comment: [employer]   Tobacco Use   Smoking status: Former    Packs/day: 1.00    Years: 30.00    Total pack years: 30.00    Types: Cigarettes    Quit date: 12/13/2002    Years since quitting: 19.2   Smokeless tobacco: Former    Quit date: 06/01/2002  Vaping Use   Vaping Use: Never used   Substance and Sexual Activity   Alcohol use: Yes    Comment: 1-2 glasses of wine with dinner   Drug use: No   Sexual activity: Not Currently    Partners: Male  Other Topics Concern   Not on file  Social History Narrative   Occupation:[employer]   Divorced   Former Smoker quit 2004   Drug use-no   Regular exercise-treadmill 3x a week   Originially from [place] Determinants of [employer] Strain: Low Risk  (03/18/2021)   Overall Financial Resource Strain (CARDIA)    Difficulty of Paying Living Expenses: Not hard at all  Food Insecurity: No Food Insecurity (03/18/2021)   Hunger Vital Sign    Worried About Running Out of Food in the Last Year: Never true    [place] in the Last Year: Never true  Transportation Needs: No Transportation Needs (03/18/2021)   PRAPARE - Hydrologist (Medical): No    Lack of Transportation (Non-Medical): No  Physical Activity: Sufficiently Active (03/18/2021)   Exercise Vital Sign    Days of Exercise per Week: 7 days    Minutes of Exercise per Session: 30 min  Stress: No Stress Concern Present (03/18/2021)   [place]    Feeling of Stress : Not at all  Social Connections: Socially Isolated (03/18/2021)   Social Connection and Isolation Panel [NHANES]    Frequency of Communication with Friends and Family: Once a week    Frequency of Social Gatherings with Friends and Family: Once a week    Attends Religious Services: Never    Marine scientist or Organizations: No    Attends Music therapist: Never    Marital Status: Divorced    Tobacco Counseling Counseling given: Not Answered   Clinical Intake:  Pre-visit preparation completed: Yes  Pain : No/denies pain  How often do you need to have someone help you when you read instructions, pamphlets, or other written materials from your doctor  or pharmacy?: 1 - Never  Diabetic? No  Activities of Daily Living    03/23/2022    9:43 AM  In your present state of health, do you have any difficulty performing the following activities:  Hearing? 0  Vision? 0  Difficulty concentrating or making decisions? 1  Comment slight memory loss sometimes  Walking or climbing stairs? 0  Dressing or bathing? 0  Doing errands, shopping? 0  Preparing Food and eating ? N  Using the Toilet? N  In the past six months, have you accidently leaked urine? Y  Comment wears pads  Do you have problems with loss of bowel control? N  Managing your Medications? N  Managing your Finances? N  Housekeeping or managing your Housekeeping? N    Patient Care Team: Carollee Herter, Alferd Apa, DO as PCP - General Adron Bene, DDS as Consulting Physician (Dentistry) Jerline Pain Mingo , DO as Consulting Physician (Optometry) Fay Records, MD as Consulting Physician (Cardiology) Jacelyn Pi, MD as Referring Physician (Endocrinology) [name] Kirke Corin, MD as Consulting Physician (Gastroenterology) Michael Boston, MD as Consulting Physician (General Surgery)  Indicate any recent Medical Services you may have received from other than [facility] providers in the past year (date may be approximate).     Assessment:   This is a routine wellness examination for Lynise.  Hearing/Vision screen No results found.  Dietary issues and exercise activities discussed: Current Exercise Habits: Home exercise routine, Type of exercise: treadmill;walking, Time (Minutes): 60, Frequency (Times/Week): 6, Weekly Exercise (Minutes/Week): 360, Intensity: Mild, Exercise limited by: None identified   Goals Addressed   None    Depression Screen    03/23/2022    9:43 AM 03/18/2021    3:55 PM 04/30/2020    3:25 PM 02/27/2019    8:11 AM 02/24/2018    9:24 AM 02/23/2017    9:22 AM 04/13/2016   10:33 AM  PHQ 2/9 Scores  PHQ - 2 Score 0 0 0 0 0 0 0    Fall Risk    03/23/2022     9:42 AM 03/18/2021    3:53 PM 04/30/2020    3:25 PM 02/27/2019    8:11 AM 02/24/2018    9:24 AM  [place] in the past year? 0 1 1 0 No  Number falls in past yr: 0 1 1 0   Injury with Fall? 0 1 1 0   Risk for fall due to : No Fall Risks History of fall(s) Impaired vision    Follow up Falls evaluation completed Falls prevention discussed       FALL RISK PREVENTION PERTAINING TO THE HOME:  Any stairs in or around the home? Yes  If so, are there any without handrails? No  Home free of loose throw rugs in walkways, pet beds, electrical cords, etc? Yes  Adequate lighting in your home to reduce risk of falls? Yes   ASSISTIVE DEVICES UTILIZED TO PREVENT FALLS:  Life alert? No  Use of a cane, walker or w/c? No  Grab bars in the bathroom? No  Shower chair or bench in shower? No  Elevated toilet seat or a handicapped toilet? No   TIMED UP AND GO:  Was the test performed?  Audio visit .    Cognitive Function:        03/23/2022    9:50 AM  6CIT Screen  What Year? 0 points  What month? 0 points  What time? 0 points  Count back from 20 0 points  Months in reverse 0 points  Repeat phrase 0 points  Total Score 0 points    Immunizations Immunization History  Administered Date(s) Administered   Fluad Quad(high Dose 65+) 03/14/2019   Influenza, High Dose Seasonal PF  02/25/2015, 04/13/2016, 02/24/2017, 02/28/2018, 03/14/2019   Influenza,inj,Quad PF,6+ Mos 06/05/2014   Influenza-Unspecified 03/30/2017, 03/22/2020, 04/02/2021   PFIZER(Purple Top)SARS-COV-2 Vaccination 08/26/2019, 09/16/2019, 10/17/2020   Pfizer Covid-19 Vaccine Bivalent Booster 29yr & up 04/02/2021   Pneumococcal Conjugate-13 02/25/2015   Pneumococcal Polysaccharide-23 01/03/2014   Tdap 10/14/2010   Zoster, Live 10/14/2010    TDAP status: Due, Education has been provided regarding the importance of this vaccine. Advised may receive this vaccine at local pharmacy or Health Dept. Aware to provide a copy  of the vaccination record if obtained from local pharmacy or Health Dept. Verbalized acceptance and understanding.  Flu Vaccine status: Due, Education has been provided regarding the importance of this vaccine. Advised may receive this vaccine at local pharmacy or Health Dept. Aware to provide a copy of the vaccination record if obtained from local pharmacy or Health Dept. Verbalized acceptance and understanding.  Pneumococcal vaccine status: Up to date  Covid-19 vaccine status: Information provided on how to obtain vaccines.   Qualifies for Shingles Vaccine? Yes   Zostavax completed Yes   Shingrix Completed?: No.    Education has been provided regarding the importance of this vaccine. Patient has been advised to call insurance company to determine out of pocket expense if they have not yet received this vaccine. Advised may also receive vaccine at local pharmacy or Health Dept. Verbalized acceptance and understanding.  Screening Tests Health Maintenance  Topic Date Due   Zoster Vaccines- Shingrix (1 of 2) Never done   TETANUS/TDAP  10/13/2020   COVID-19 Vaccine (5 - Pfizer risk series) 05/28/2021   MAMMOGRAM  12/03/2021   INFLUENZA VACCINE  12/30/2021   DEXA SCAN  10/31/2022   COLONOSCOPY (Pts 45-453yrInsurance coverage will need to be confirmed)  05/15/2024   Pneumonia Vaccine 6516Years old  Completed   Hepatitis C Screening  Completed   HPV VACCINES  Aged Out   Fecal DNA (Cologuard)  Discontinued    Health Maintenance  Health Maintenance Due  Topic Date Due   Zoster Vaccines- Shingrix (1 of 2) Never done   TETANUS/TDAP  10/13/2020   COVID-19 Vaccine (5 - Pfizer risk series) 05/28/2021   MAMMOGRAM  12/03/2021   INFLUENZA VACCINE  12/30/2021    Colorectal cancer screening: Type of screening: Colonoscopy. Completed 05/15/21. Repeat every 3 years  Mammogram status: Completed 12/03/20. Repeat every year  Bone Density status: Completed 10/30/20. Results reflect: Bone density  results: OSTEOPOROSIS. Repeat every 2 years.  Lung Cancer Screening: (Low Dose CT Chest recommended if Age 75-80ears, 30 pack-year currently smoking OR have quit w/in 15years.) does not qualify.   Lung Cancer Screening Referral: N/a  Additional Screening:  Hepatitis C Screening: does qualify; Completed 02/25/15  Vision Screening: Recommended annual ophthalmology exams for early detection of glaucoma and other disorders of the eye. Is the patient up to date with their annual eye exam?  Yes  Who is the provider or what is the name of the office in which the patient attends annual eye exams? [place] is not established with a provider, would they like to be referred to a provider to establish care? No .   Dental Screening: Recommended annual dental exams for proper oral hygiene  Community Resource Referral / Chronic Care Management: CRR required this visit?  No   CCM required this visit?  No      Plan:     I have personally reviewed and noted the following in the patient's chart:  Medical and social history Use of alcohol, tobacco or illicit drugs  Current medications and supplements including opioid prescriptions. Patient is not currently taking opioid prescriptions. Functional ability and status Nutritional status Physical activity Advanced directives List of other physicians Hospitalizations, surgeries, and ER visits in previous 12 months Vitals Screenings to include cognitive, depression, and falls Referrals and appointments  In addition, I have reviewed and discussed with patient certain preventive protocols, quality metrics, and best practice recommendations. A written personalized care plan for preventive services as well as general preventive health recommendations were provided to patient.   Due to this being a telephonic visit, the after visit summary with patients personalized plan was offered to patient via mail or my-chart. Patient would  like to access on my-chart.  Beatris Ship, [place]   03/23/2022   Nurse Notes: None

## 2022-03-23 NOTE — Patient Instructions (Signed)
Tina [name] , Thank you for taking time to come for your Medicare Wellness Visit. I appreciate your ongoing commitment to your health goals. Please review the following plan we discussed and let me know if I can assist you in the future.   These are the goals we discussed:  Goals      Continue healthy diet     Increase physical activity     Patient Stated     Drink more water        This is a list of the screening recommended for you and due dates:  Health Maintenance  Topic Date Due   Zoster (Shingles) Vaccine (1 of 2) Never done   Tetanus Vaccine  10/13/2020   COVID-19 Vaccine (5 - Pfizer risk series) 05/28/2021   Mammogram  12/03/2021   Flu Shot  12/30/2021   DEXA scan (bone density measurement)  10/31/2022   Colon Cancer Screening  05/15/2024   Pneumonia Vaccine  Completed   Hepatitis C Screening: USPSTF Recommendation to screen - Ages 18-79 yo.  Completed   HPV Vaccine  Aged Out   Cologuard (Stool DNA test)  Discontinued     Next appointment: Follow up in one year for your annual wellness visit.   Preventive Care 75 Years and Older, Female Preventive care refers to lifestyle choices and visits with your health care provider that can promote health and wellness. What does preventive care include? A yearly physical exam. This is also called an annual well check. Dental exams once or twice a year. Routine eye exams. Ask your health care provider how often you should have your eyes checked. Personal lifestyle choices, including: Daily care of your teeth and gums. Regular physical activity. Eating a healthy diet. Avoiding tobacco and drug use. Limiting alcohol use. Practicing safe sex. Taking low-dose aspirin every day. Taking vitamin and mineral supplements as recommended by your health care provider. What happens during an annual well check? The services and screenings done by your health care provider during your annual well check will depend on your age, overall  health, lifestyle risk factors, and family history of disease. Counseling  Your health care provider may ask you questions about your: Alcohol use. Tobacco use. Drug use. Emotional well-being. Home and relationship well-being. Sexual activity. Eating habits. History of falls. Memory and ability to understand (cognition). Work and work Statistician. Reproductive health. Screening  You may have the following tests or measurements: Height, weight, and BMI. Blood pressure. Lipid and cholesterol levels. These may be checked every 5 years, or more frequently if you are over 19 years old. Skin check. Lung cancer screening. You may have this screening every year starting at age 75 if you have a 30-pack-year history of smoking and currently smoke or have quit within the past 15 years. Fecal occult blood test (FOBT) of the stool. You may have this test every year starting at age 75. Flexible sigmoidoscopy or colonoscopy. You may have a sigmoidoscopy every 5 years or a colonoscopy every 10 years starting at age 75. Hepatitis C blood test. Hepatitis B blood test. Sexually transmitted disease (STD) testing. Diabetes screening. This is done by checking your blood sugar (glucose) after you have not eaten for a while (fasting). You may have this done every 1-3 years. Bone density scan. This is done to screen for osteoporosis. You may have this done starting at age 75. Mammogram. This may be done every 1-2 years. Talk to your health care provider about how often you should  have regular mammograms. Talk with your health care provider about your test results, treatment options, and if necessary, the need for more tests. Vaccines  Your health care provider may recommend certain vaccines, such as: Influenza vaccine. This is recommended every year. Tetanus, diphtheria, and acellular pertussis (Tdap, Td) vaccine. You may need a Td booster every 10 years. Zoster vaccine. You may need this after age  75. Pneumococcal 13-valent conjugate (PCV13) vaccine. One dose is recommended after age 75. Pneumococcal polysaccharide (PPSV23) vaccine. One dose is recommended after age 75. Talk to your health care provider about which screenings and vaccines you need and how often you need them. This information is not intended to replace advice given to you by your health care provider. Make sure you discuss any questions you have with your health care provider. Document Released: 06/14/2015 Document Revised: 02/05/2016 Document Reviewed: 03/19/2015 Elsevier Interactive Patient Education  2017 [place] Prevention in the Home Falls can cause injuries. They can happen to people of all ages. There are many things you can do to make your home safe and to help prevent falls. What can I do on the outside of my home? Regularly fix the edges of walkways and driveways and fix any cracks. Remove anything that might make you trip as you walk through a door, such as a raised step or threshold. Trim any bushes or trees on the path to your home. Use bright outdoor lighting. Clear any walking paths of anything that might make someone trip, such as rocks or tools. Regularly check to see if handrails are loose or broken. Make sure that both sides of any steps have handrails. Any raised decks and porches should have guardrails on the edges. Have any leaves, snow, or ice cleared regularly. Use sand or salt on walking paths during winter. Clean up any spills in your garage right away. This includes oil or grease spills. What can I do in the bathroom? Use night lights. Install grab bars by the toilet and in the tub and shower. Do not use towel bars as grab bars. Use non-skid mats or decals in the tub or shower. If you need to sit down in the shower, use a plastic, non-slip stool. Keep the floor dry. Clean up any water that spills on the floor as soon as it happens. Remove soap buildup in the tub or shower  regularly. Attach bath mats securely with double-sided non-slip rug tape. Do not have throw rugs and other things on the floor that can make you trip. What can I do in the bedroom? Use night lights. Make sure that you have a light by your bed that is easy to reach. Do not use any sheets or blankets that are too big for your bed. They should not hang down onto the floor. Have a firm chair that has side arms. You can use this for support while you get dressed. Do not have throw rugs and other things on the floor that can make you trip. What can I do in the kitchen? Clean up any spills right away. Avoid walking on wet floors. Keep items that you use a lot in easy-to-reach places. If you need to reach something above you, use a strong step stool that has a grab bar. Keep electrical cords out of the way. Do not use floor polish or wax that makes floors slippery. If you must use wax, use non-skid floor wax. Do not have throw rugs and other things on the floor  that can make you trip. What can I do with my stairs? Do not leave any items on the stairs. Make sure that there are handrails on both sides of the stairs and use them. Fix handrails that are broken or loose. Make sure that handrails are as long as the stairways. Check any carpeting to make sure that it is firmly attached to the stairs. Fix any carpet that is loose or worn. Avoid having throw rugs at the top or bottom of the stairs. If you do have throw rugs, attach them to the floor with carpet tape. Make sure that you have a light switch at the top of the stairs and the bottom of the stairs. If you do not have them, ask someone to add them for you. What else can I do to help prevent falls? Wear shoes that: Do not have high heels. Have rubber bottoms. Are comfortable and fit you well. Are closed at the toe. Do not wear sandals. If you use a stepladder: Make sure that it is fully opened. Do not climb a closed stepladder. Make sure that  both sides of the stepladder are locked into place. Ask someone to hold it for you, if possible. Clearly mark and make sure that you can see: Any grab bars or handrails. First and last steps. Where the edge of each step is. Use tools that help you move around (mobility aids) if they are needed. These include: Canes. Walkers. Scooters. Crutches. Turn on the lights when you go into a dark area. Replace any light bulbs as soon as they burn out. Set up your furniture so you have a clear path. Avoid moving your furniture around. If any of your floors are uneven, fix them. If there are any pets around you, be aware of where they are. Review your medicines with your doctor. Some medicines can make you feel dizzy. This can increase your chance of falling. Ask your doctor what other things that you can do to help prevent falls. This information is not intended to replace advice given to you by your health care provider. Make sure you discuss any questions you have with your health care provider. Document Released: 03/14/2009 Document Revised: 10/24/2015 Document Reviewed: 06/22/2014 Elsevier Interactive Patient Education  2017 [employer].

## 2022-03-31 ENCOUNTER — Other Ambulatory Visit (HOSPITAL_COMMUNITY): Payer: Self-pay

## 2022-03-31 NOTE — Telephone Encounter (Signed)
No Prolia injection Last Evenity injection 03/03/2022

## 2022-04-01 NOTE — Telephone Encounter (Signed)
Prolia verification of benefits submitted  

## 2022-04-03 ENCOUNTER — Ambulatory Visit: Payer: Medicare Other

## 2022-04-13 ENCOUNTER — Other Ambulatory Visit (HOSPITAL_COMMUNITY): Payer: Self-pay

## 2022-04-26 NOTE — Telephone Encounter (Signed)
Per telephone encounter 04/08/22:  "I let the office know before that I decided not to go with [facility] because I had long discussion with insurance coverage for this and they miscommunicated the cost to me prior to my starting Rx.. the representative with [place] [facility] assured me at the time it was going to be $100 per month and assured me that was for Evinity and not generic .. thank you "

## 2022-04-27 ENCOUNTER — Other Ambulatory Visit (HOSPITAL_COMMUNITY): Payer: Self-pay

## 2022-04-27 ENCOUNTER — Telehealth: Payer: Self-pay

## 2022-04-27 NOTE — Telephone Encounter (Signed)
Pharmacy Patient Advocate Encounter   Received notification that prior authorization for Evenity '105mg'$ /1.63m is required/requested.   PA submitted on 04/27/22 to [employer] Part D via CoverMyMeds  Key Key: [id number] Status is pending

## 2022-04-27 NOTE — Telephone Encounter (Signed)
Please initiate Evenity PA

## 2022-04-28 NOTE — Telephone Encounter (Signed)
Per note on 04/08/22, appears patient has decided to not move forward with Evenity due to cost

## 2022-04-28 NOTE — Telephone Encounter (Signed)
Pharmacy Patient Advocate Encounter  Received notification from [employer] that the request for prior authorization for Evenity '105MG'$ /1.17ML syringes has been denied due to not meeting PA requirements.       Key: [id number]

## 2022-04-28 NOTE — Telephone Encounter (Signed)
Received a fax regarding Prior Authorization from [place] for Evenity inj.   Authorization has been DENIED because:    Phone# [phone] Reference #: [id number]

## 2022-04-29 ENCOUNTER — Other Ambulatory Visit (HOSPITAL_COMMUNITY): Payer: Self-pay

## 2022-04-29 NOTE — Telephone Encounter (Signed)
Pcp would like to check for Prolia since pt has tried and failed Fosamax.

## 2022-04-30 ENCOUNTER — Other Ambulatory Visit (HOSPITAL_COMMUNITY): Payer: Self-pay

## 2022-04-30 NOTE — Telephone Encounter (Signed)
PA submitted on 04/30/22 for Prolia to [facility]: [id number]  Status is pending

## 2022-04-30 NOTE — Telephone Encounter (Signed)
Pharmacy Patient Advocate Encounter  Insurance verification completed.    The patient is insured through [employer]   Ran test claims for: Prolia '60mg'$ .  Pharmacy benefit copay: $300.00

## 2022-06-03 ENCOUNTER — Other Ambulatory Visit: Payer: Self-pay | Admitting: Family Medicine

## 2022-06-03 ENCOUNTER — Encounter: Payer: Self-pay | Admitting: Family Medicine

## 2022-06-03 DIAGNOSIS — I10 Essential (primary) hypertension: Secondary | ICD-10-CM

## 2022-06-05 ENCOUNTER — Ambulatory Visit (INDEPENDENT_AMBULATORY_CARE_PROVIDER_SITE_OTHER): Payer: Medicare Other | Admitting: Family Medicine

## 2022-06-05 ENCOUNTER — Encounter: Payer: Self-pay | Admitting: Family Medicine

## 2022-06-05 VITALS — BP 132/80 | HR 55 | Temp 98.0°F | Resp 18 | Ht 65.0 in | Wt 156.8 lb

## 2022-06-05 DIAGNOSIS — E782 Mixed hyperlipidemia: Secondary | ICD-10-CM

## 2022-06-05 DIAGNOSIS — R5383 Other fatigue: Secondary | ICD-10-CM | POA: Diagnosis not present

## 2022-06-05 DIAGNOSIS — M810 Age-related osteoporosis without current pathological fracture: Secondary | ICD-10-CM | POA: Diagnosis not present

## 2022-06-05 DIAGNOSIS — N393 Stress incontinence (female) (male): Secondary | ICD-10-CM

## 2022-06-05 DIAGNOSIS — I10 Essential (primary) hypertension: Secondary | ICD-10-CM | POA: Diagnosis not present

## 2022-06-05 MED ORDER — SOLIFENACIN SUCCINATE 5 MG PO TABS: 5.0000 mg | ORAL_TABLET | Freq: Every day | ORAL | 2 refills | Status: DC

## 2022-06-05 NOTE — Patient Instructions (Signed)
Insomnia Insomnia is a sleep disorder that makes it difficult to fall asleep or stay asleep. Insomnia can cause fatigue, low energy, difficulty concentrating, mood swings, and poor performance at work or school. There are three different ways to classify insomnia: Difficulty falling asleep. Difficulty staying asleep. Waking up too early in the morning. Any type of insomnia can be long-term (chronic) or short-term (acute). Both are common. Short-term insomnia usually lasts for 3 months or less. Chronic insomnia occurs at least three times a week for longer than 3 months. What are the causes? Insomnia may be caused by another condition, situation, or substance, such as: Having certain mental health conditions, such as anxiety and depression. Using caffeine, alcohol, tobacco, or drugs. Having gastrointestinal conditions, such as gastroesophageal reflux disease (GERD). Having certain medical conditions. These include: Asthma. Alzheimer's disease. Stroke. Chronic pain. An overactive thyroid gland (hyperthyroidism). Other sleep disorders, such as restless legs syndrome and sleep apnea. Menopause. Sometimes, the cause of insomnia may not be known. What increases the risk? Risk factors for insomnia include: Gender. Females are affected more often than males. Age. Insomnia is more common as people get older. Stress and certain medical and mental health conditions. Lack of exercise. Having an irregular work schedule. This may include working night shifts and traveling between different time zones. What are the signs or symptoms? If you have insomnia, the main symptom is having trouble falling asleep or having trouble staying asleep. This may lead to other symptoms, such as: Feeling tired or having low energy. Feeling nervous about going to sleep. Not feeling rested in the morning. Having trouble concentrating. Feeling irritable, anxious, or depressed. How is this diagnosed? This condition  may be diagnosed based on: Your symptoms and medical history. Your health care provider may ask about: Your sleep habits. Any medical conditions you have. Your mental health. A physical exam. How is this treated? Treatment for insomnia depends on the cause. Treatment may focus on treating an underlying condition that is causing the insomnia. Treatment may also include: Medicines to help you sleep. Counseling or therapy. Lifestyle adjustments to help you sleep better. Follow these instructions at home: Eating and drinking  Limit or avoid alcohol, caffeinated beverages, and products that contain nicotine and tobacco, especially close to bedtime. These can disrupt your sleep. Do not eat a large meal or eat spicy foods right before bedtime. This can lead to digestive discomfort that can make it hard for you to sleep. Sleep habits  Keep a sleep diary to help you and your health care provider figure out what could be causing your insomnia. Write down: When you sleep. When you wake up during the night. How well you sleep and how rested you feel the next day. Any side effects of medicines you are taking. What you eat and drink. Make your bedroom a dark, comfortable place where it is easy to fall asleep. Put up shades or blackout curtains to block light from outside. Use a white noise machine to block noise. Keep the temperature cool. Limit screen use before bedtime. This includes: Not watching TV. Not using your smartphone, tablet, or computer. Stick to a routine that includes going to bed and waking up at the same times every day and night. This can help you fall asleep faster. Consider making a quiet activity, such as reading, part of your nighttime routine. Try to avoid taking naps during the day so that you sleep better at night. Get out of bed if you are still awake after   15 minutes of trying to sleep. Keep the lights down, but try reading or doing a quiet activity. When you feel  sleepy, go back to bed. General instructions Take over-the-counter and prescription medicines only as told by your health care provider. Exercise regularly as told by your health care provider. However, avoid exercising in the hours right before bedtime. Use relaxation techniques to manage stress. Ask your health care provider to suggest some techniques that may work well for you. These may include: Breathing exercises. Routines to release muscle tension. Visualizing peaceful scenes. Make sure that you drive carefully. Do not drive if you feel very sleepy. Keep all follow-up visits. This is important. Contact a health care provider if: You are tired throughout the day. You have trouble in your daily routine due to sleepiness. You continue to have sleep problems, or your sleep problems get worse. Get help right away if: You have thoughts about hurting yourself or someone else. Get help right away if you feel like you may hurt yourself or others, or have thoughts about taking your own life. Go to your nearest emergency room or: Call 911. Call the National Suicide Prevention Lifeline at 1-800-273-8255 or 988. This is open 24 hours a day. Text the Crisis Text Line at 741741. Summary Insomnia is a sleep disorder that makes it difficult to fall asleep or stay asleep. Insomnia can be long-term (chronic) or short-term (acute). Treatment for insomnia depends on the cause. Treatment may focus on treating an underlying condition that is causing the insomnia. Keep a sleep diary to help you and your health care provider figure out what could be causing your insomnia. This information is not intended to replace advice given to you by your health care provider. Make sure you discuss any questions you have with your health care provider. Document Revised: 04/28/2021 Document Reviewed: 04/28/2021 Elsevier Patient Education  2023 Elsevier Inc.  

## 2022-06-05 NOTE — Progress Notes (Unsigned)
Established Patient Office Visit  Subjective   Patient ID: Tina [name], female    DOB: 01/25/1946  Age: 77 y.o. MRN: [id number]  Chief Complaint  Patient presents with   Hypertension   Hyperlipidemia   Follow-up    HPI Pt is here to f/u bp and cholesterol.  She could not cont the evenity due to cost so she went back on fosamax  Patient Active Problem List   Diagnosis Date Noted   Spinal stenosis of lumbar region 06/19/2021   Low back pain with radiation 06/05/2021   Right leg weakness 06/05/2021   Primary hypertension 06/05/2021   Pain in left shin 01/01/2020   Adenomatous polyp s/p LAR rectosigmoid resection 07/12/2019 05/15/2019   Osteoporosis 04/06/2019   Sinus bradycardia 02/24/2017   Post-menopausal bleeding 10/28/2016   Left shoulder pain 01/22/2016   Right wrist injury 03/18/2015   Obesity (BMI 30-39.9) 05/29/2013   Otitis externa 09/30/2010   CONJUNCTIVITIS, BACTERIAL 12/16/2009   SINUSITIS- ACUTE-NOS 08/01/2009   TOBACCO ABUSE, HX OF 07/22/2009   OTITIS EXTERNA, ACUTE, RIGHT 02/08/2009   Goiter 02/10/2007   Hyperlipidemia 02/10/2007   Essential hypertension 02/10/2007   MYOCARDIAL INFARCTION, HX OF 02/10/2007   DIZZINESS 02/10/2007   EDEMA LEG 02/10/2007   FATTY LIVER DISEASE, HX OF 02/10/2007   Past Medical History:  Diagnosis Date   Allergy    seasonal   Cataract    removed both eyes   Fatty liver    Goiter    treated   HTN (hypertension)    Hyperlipidemia    Myocardial infarct ([place]) hx of 2004   NSTEMI with stent   Osteoporosis    Patellar fracture    Sleep apnea    no cpap   Wrist fracture    right wrist   Past Surgical History:  Procedure Laterality Date   CARDIAC CATHETERIZATION  2004   stent   CATARACT EXTRACTION, BILATERAL  05/2020   COLONOSCOPY     POLYPECTOMY     PROCTOSCOPY N/A 07/12/2019   Procedure: RIGID PROCTOSCOPY;  Surgeon: Michael Boston, MD;  Location: [facility];  Service: General;  Laterality: N/A;   sleep study  2012    Social History   Tobacco Use   Smoking status: Former    Packs/day: 1.00    Years: 30.00    Total pack years: 30.00    Types: Cigarettes    Quit date: 12/13/2002    Years since quitting: 19.4   Smokeless tobacco: Former    Quit date: 06/01/2002  Vaping Use   Vaping Use: Never used  Substance Use Topics   Alcohol use: Yes    Comment: 1-2 glasses of wine with dinner   Drug use: No   Social History   Socioeconomic History   Marital status: Divorced    Spouse name: Not on file   Number of children: Not on file   Years of education: Not on file   Highest education level: Not on file  Occupational History   Occupation: nurse    Employer: OTHER    Comment: [employer]   Tobacco Use   Smoking status: Former    Packs/day: 1.00    Years: 30.00    Total pack years: 30.00    Types: Cigarettes    Quit date: 12/13/2002    Years since quitting: 19.4   Smokeless tobacco: Former    Quit date: 06/01/2002  Vaping Use   Vaping Use: Never used  Substance and Sexual Activity   Alcohol  use: Yes    Comment: 1-2 glasses of wine with dinner   Drug use: No   Sexual activity: Not Currently    Partners: Male  Other Topics Concern   Not on file  Social History Narrative   Occupation:[employer]   Divorced   Former Smoker quit 2004   Drug use-no   Regular exercise-treadmill 3x a week   Originially from [place] Determinants of [employer] Strain: Low Risk  (03/18/2021)   Overall Financial Resource Strain (CARDIA)    Difficulty of Paying Living Expenses: Not hard at all  Food Insecurity: No Food Insecurity (03/18/2021)   Hunger Vital Sign    Worried About Running Out of Food in the Last Year: Never true    Ran Out of Food in the Last Year: Never true  Transportation Needs: No Transportation Needs (03/18/2021)   PRAPARE - Hydrologist (Medical): No    Lack of Transportation (Non-Medical): No  Physical Activity:  Sufficiently Active (03/18/2021)   Exercise Vital Sign    Days of Exercise per Week: 7 days    Minutes of Exercise per Session: 30 min  Stress: No Stress Concern Present (03/18/2021)   [place]    Feeling of Stress : Not at all  Social Connections: Socially Isolated (03/18/2021)   Social Connection and Isolation Panel [NHANES]    Frequency of Communication with Friends and Family: Once a week    Frequency of Social Gatherings with Friends and Family: Once a week    Attends Religious Services: Never    Marine scientist or Organizations: No    Attends Archivist Meetings: Never    Marital Status: Divorced  Human resources officer Violence: Not At Risk (03/18/2021)   Humiliation, Afraid, Rape, and Kick questionnaire    Fear of Current or Ex-Partner: No    Emotionally Abused: No    Physically Abused: No    Sexually Abused: No   Family Status  Relation Name Status   Mother  Deceased   Father  Deceased   Sister  Alive   Sister  Deceased   Other  (Not Specified)   Other  (Not Specified)   Other  (Not Specified)   MGM  (Not Specified)   Brother  (Not Specified)   Neg Hx  (Not Specified)   Family History  Problem Relation Age of Onset   Hypertension Mother    Heart disease Mother 32       MI--- died from #3 at 6   Ovarian cancer Mother    Hypertension Father    Cancer Father 52       ovarian   Heart disease Father    Hypertension Sister    Cancer Sister 1       brain tumor   Coronary artery disease Other    Lung cancer Other    Ovarian cancer Other    Diabetes Maternal Grandmother    Hypertension Brother    Arthritis Brother    Colon cancer Neg Hx    Colon polyps Neg Hx    Esophageal cancer Neg Hx    Stomach cancer Neg Hx    Rectal cancer Neg Hx    No Known Allergies    Review of Systems  Constitutional:  Negative for fever and malaise/fatigue.  HENT:  Negative for congestion.    Eyes:  Negative for blurred vision.  Respiratory:  Negative for shortness of breath.   Cardiovascular:  Negative for chest pain, palpitations and leg swelling.  Gastrointestinal:  Negative for abdominal pain, blood in stool and nausea.  Genitourinary:  Negative for dysuria and frequency.  Musculoskeletal:  Negative for falls.  Skin:  Negative for rash.  Neurological:  Negative for dizziness, loss of consciousness and headaches.  Endo/Heme/Allergies:  Negative for environmental allergies.  Psychiatric/Behavioral:  Negative for depression. The patient is not nervous/anxious.       Objective:     BP 132/80 (BP Location: Left Arm, Patient Position: Sitting, Cuff Size: Normal)   Pulse (!) 55   Temp 98 F (36.7 C) (Oral)   Resp 18   Ht '5\' 5"'$  (1.651 m)   Wt 156 lb 12.8 oz (71.1 kg)   SpO2 100%   BMI 26.09 kg/m  BP Readings from Last 3 Encounters:  06/05/22 132/80  12/15/21 120/72  11/26/21 130/80   Wt Readings from Last 3 Encounters:  06/05/22 156 lb 12.8 oz (71.1 kg)  12/15/21 156 lb 12.8 oz (71.1 kg)  11/26/21 157 lb 3.2 oz (71.3 kg)   SpO2 Readings from Last 3 Encounters:  06/05/22 100%  12/15/21 96%  11/26/21 93%      Physical Exam Vitals and nursing note reviewed.  Constitutional:      Appearance: She is well-developed.  HENT:     Head: Normocephalic and atraumatic.  Eyes:     Conjunctiva/sclera: Conjunctivae normal.  Neck:     Thyroid: No thyromegaly.     Vascular: No carotid bruit or JVD.  Cardiovascular:     Rate and Rhythm: Normal rate and regular rhythm.     Heart sounds: Normal heart sounds. No murmur heard. Pulmonary:     Effort: Pulmonary effort is normal. No respiratory distress.     Breath sounds: Normal breath sounds. No wheezing or rales.  Chest:     Chest wall: No tenderness.  Musculoskeletal:     Cervical back: Normal range of motion and neck supple.  Neurological:     Mental Status: She is alert and oriented to person, place, and time.       Results for orders placed or performed in visit on 06/05/22  Lipid panel  Result Value Ref Range   Cholesterol 188 <200 mg/dL   HDL 105 > OR = 50 mg/dL   Triglycerides 53 <150 mg/dL   LDL Cholesterol (Calc) 70 mg/dL (calc)   Total CHOL/HDL Ratio 1.8 <5.0 (calc)   Non-HDL Cholesterol (Calc) 83 <130 mg/dL (calc)  Comprehensive metabolic panel  Result Value Ref Range   Glucose, Bld 85 65 - 99 mg/dL   BUN 18 7 - 25 mg/dL   Creat 0.62 0.60 - 1.00 mg/dL   BUN/Creatinine Ratio SEE NOTE: 6 - 22 (calc)   Sodium 139 135 - 146 mmol/L   Potassium 4.6 3.5 - 5.3 mmol/L   Chloride 99 98 - 110 mmol/L   CO2 27 20 - 32 mmol/L   Calcium 10.1 8.6 - 10.4 mg/dL   Total Protein 7.0 6.1 - 8.1 g/dL   Albumin 4.9 3.6 - 5.1 g/dL   Globulin 2.1 1.9 - 3.7 g/dL (calc)   AG Ratio 2.3 1.0 - 2.5 (calc)   Total Bilirubin 0.6 0.2 - 1.2 mg/dL   Alkaline phosphatase (APISO) 34 (L) 37 - 153 U/L   AST 19 10 - 35 U/L   ALT 12 6 - 29 U/L  CBC with Differential/Platelet  Result Value Ref Range  WBC 5.5 3.8 - 10.8 Thousand/uL   RBC 4.19 3.80 - 5.10 Million/uL   Hemoglobin 13.6 11.7 - 15.5 g/dL   HCT 38.9 35.0 - 45.0 %   MCV 92.8 80.0 - 100.0 fL   MCH 32.5 27.0 - 33.0 pg   MCHC 35.0 32.0 - 36.0 g/dL   RDW 12.0 11.0 - 15.0 %   Platelets 229 140 - 400 Thousand/uL   MPV 9.0 7.5 - 12.5 fL   Neutro Abs 3,229 1,500 - 7,800 cells/uL   Lymphs Abs 1,799 850 - 3,900 cells/uL   Absolute Monocytes 418 200 - 950 cells/uL   Eosinophils Absolute 28 15 - 500 cells/uL   Basophils Absolute 28 0 - 200 cells/uL   Neutrophils Relative % 58.7 %   Total Lymphocyte 32.7 %   Monocytes Relative 7.6 %   Eosinophils Relative 0.5 %   Basophils Relative 0.5 %  VITAMIN D 25 Hydroxy (Vit-D Deficiency, Fractures)  Result Value Ref Range   Vit D, 25-Hydroxy 37 30 - 100 ng/mL  TSH  Result Value Ref Range   TSH 1.51 0.40 - 4.50 mIU/L  Vitamin B12  Result Value Ref Range   Vitamin B-12 696 200 - 1,100 pg/mL    Last CBC Lab  Results  Component Value Date   WBC 5.5 06/05/2022   HGB 13.6 06/05/2022   HCT 38.9 06/05/2022   MCV 92.8 06/05/2022   MCH 32.5 06/05/2022   RDW 12.0 06/05/2022   PLT 229 32/44/0102   Last metabolic panel Lab Results  Component Value Date   GLUCOSE 85 06/05/2022   NA 139 06/05/2022   K 4.6 06/05/2022   CL 99 06/05/2022   CO2 27 06/05/2022   BUN 18 06/05/2022   CREATININE 0.62 06/05/2022   GFRNONAA 90 07/09/2020   CALCIUM 10.1 06/05/2022   PHOS 3.8 12/15/2021   PROT 7.0 06/05/2022   ALBUMIN 4.6 12/15/2021   LABGLOB 1.7 07/09/2020   AGRATIO 2.8 (H) 07/09/2020   BILITOT 0.6 06/05/2022   ALKPHOS 33 (L) 12/15/2021   AST 19 06/05/2022   ALT 12 06/05/2022   ANIONGAP 7 07/13/2019   Last lipids Lab Results  Component Value Date   CHOL 188 06/05/2022   HDL 105 06/05/2022   LDLCALC 70 06/05/2022   LDLDIRECT 123.7 05/30/2013   TRIG 53 06/05/2022   CHOLHDL 1.8 06/05/2022   Last hemoglobin A1c Lab Results  Component Value Date   HGBA1C 5.4 07/10/2019   Last thyroid functions Lab Results  Component Value Date   TSH 1.51 06/05/2022   Last vitamin D Lab Results  Component Value Date   VD25OH 37 06/05/2022   Last vitamin B12 and Folate Lab Results  Component Value Date   VOZDGUYQ03 474 06/05/2022   FOLATE 19.7 02/10/2007      The ASCVD Risk score (Arnett DK, et al., 2019) failed to calculate for the following reasons:   The patient has a prior MI or stroke diagnosis    Assessment & Plan:   Problem List Items Addressed This Visit       Unprioritized   Osteoporosis    On fosamax  Evenity too expensive       Relevant Orders   VITAMIN D 25 Hydroxy (Vit-D Deficiency, Fractures) (Completed)   Hyperlipidemia    Encourage heart healthy diet such as MIND or DASH diet, increase exercise, avoid trans fats, simple carbohydrates and processed foods, consider a krill or fish or flaxseed oil cap daily.  Relevant Orders   Lipid panel (Completed)    Comprehensive metabolic panel (Completed)   CBC with Differential/Platelet (Completed)   Essential hypertension - Primary    Well controlled, no changes to meds. Encouraged heart healthy diet such as the DASH diet and exercise as tolerated.        Relevant Orders   Lipid panel (Completed)   Comprehensive metabolic panel (Completed)   CBC with Differential/Platelet (Completed)   Other Visit Diagnoses     Other fatigue       Relevant Orders   TSH (Completed)   Vitamin B12 (Completed)   Stress incontinence       Relevant Medications   solifenacin (VESICARE) 5 MG tablet       Return in about 6 months (around 12/04/2022), or if symptoms worsen or fail to improve, for annual exam, fasting.    Ann Held, DO

## 2022-06-06 LAB — CBC WITH DIFFERENTIAL/PLATELET
Absolute Monocytes: 418 cells/uL (ref 200–950)
Basophils Absolute: 28 cells/uL (ref 0–200)
Basophils Relative: 0.5 %
Eosinophils Absolute: 28 cells/uL (ref 15–500)
Eosinophils Relative: 0.5 %
HCT: 38.9 % (ref 35.0–45.0)
Hemoglobin: 13.6 g/dL (ref 11.7–15.5)
Lymphs Abs: 1799 cells/uL (ref 850–3900)
MCH: 32.5 pg (ref 27.0–33.0)
MCHC: 35 g/dL (ref 32.0–36.0)
MCV: 92.8 fL (ref 80.0–100.0)
MPV: 9 fL (ref 7.5–12.5)
Monocytes Relative: 7.6 %
Neutro Abs: 3229 cells/uL (ref 1500–7800)
Neutrophils Relative %: 58.7 %
Platelets: 229 10*3/uL (ref 140–400)
RBC: 4.19 10*6/uL (ref 3.80–5.10)
RDW: 12 % (ref 11.0–15.0)
Total Lymphocyte: 32.7 %
WBC: 5.5 10*3/uL (ref 3.8–10.8)

## 2022-06-06 LAB — LIPID PANEL
Cholesterol: 188 mg/dL (ref <200)
HDL: 105 mg/dL (ref >=50)
LDL Cholesterol (Calc): 70 mg/dL (calc)
Non-HDL Cholesterol (Calc): 83 mg/dL (calc) (ref <130)
Total CHOL/HDL Ratio: 1.8 (calc) (ref <5.0)
Triglycerides: 53 mg/dL (ref <150)

## 2022-06-06 LAB — COMPREHENSIVE METABOLIC PANEL
AG Ratio: 2.3 (calc) (ref 1.0–2.5)
ALT: 12 U/L (ref 6–29)
AST: 19 U/L (ref 10–35)
Albumin: 4.9 g/dL (ref 3.6–5.1)
Alkaline phosphatase (APISO): 34 U/L — ABNORMAL LOW (ref 37–153)
BUN: 18 mg/dL (ref 7–25)
CO2: 27 mmol/L (ref 20–32)
Calcium: 10.1 mg/dL (ref 8.6–10.4)
Chloride: 99 mmol/L (ref 98–110)
Creat: 0.62 mg/dL (ref 0.60–1.00)
Globulin: 2.1 g/dL (calc) (ref 1.9–3.7)
Glucose, Bld: 85 mg/dL (ref 65–99)
Potassium: 4.6 mmol/L (ref 3.5–5.3)
Sodium: 139 mmol/L (ref 135–146)
Total Bilirubin: 0.6 mg/dL (ref 0.2–1.2)
Total Protein: 7 g/dL (ref 6.1–8.1)

## 2022-06-06 LAB — VITAMIN D 25 HYDROXY (VIT D DEFICIENCY, FRACTURES): Vit D, 25-Hydroxy: 37 ng/mL (ref 30–100)

## 2022-06-06 LAB — TSH: TSH: 1.51 mIU/L (ref 0.40–4.50)

## 2022-06-06 LAB — VITAMIN B12: Vitamin B-12: 696 pg/mL (ref 200–1100)

## 2022-06-06 NOTE — Assessment & Plan Note (Signed)
Encourage heart healthy diet such as MIND or DASH diet, increase exercise, avoid trans fats, simple carbohydrates and processed foods, consider a krill or fish or flaxseed oil cap daily.  °

## 2022-06-06 NOTE — Assessment & Plan Note (Signed)
On fosamax  Evenity too expensive

## 2022-06-06 NOTE — Assessment & Plan Note (Signed)
Well controlled, no changes to meds. Encouraged heart healthy diet such as the DASH diet and exercise as tolerated.  °

## 2022-06-09 NOTE — Telephone Encounter (Signed)
Patient is now on Fosamax due to the price of Evenity

## 2022-08-30 ENCOUNTER — Other Ambulatory Visit: Payer: Self-pay | Admitting: Family Medicine

## 2022-08-30 DIAGNOSIS — N393 Stress incontinence (female) (male): Secondary | ICD-10-CM

## 2022-10-22 DIAGNOSIS — D3701 Neoplasm of uncertain behavior of lip: Secondary | ICD-10-CM | POA: Diagnosis not present

## 2022-10-22 DIAGNOSIS — K136 Irritative hyperplasia of oral mucosa: Secondary | ICD-10-CM | POA: Diagnosis not present

## 2022-10-22 DIAGNOSIS — K1370 Unspecified lesions of oral mucosa: Secondary | ICD-10-CM | POA: Diagnosis not present

## 2022-11-27 ENCOUNTER — Other Ambulatory Visit: Payer: Self-pay | Admitting: Internal Medicine

## 2022-11-27 ENCOUNTER — Other Ambulatory Visit: Payer: Self-pay

## 2022-11-27 MED ORDER — ROSUVASTATIN CALCIUM 20 MG PO TABS: ORAL_TABLET | ORAL | 0 refills | Status: DC

## 2022-11-29 ENCOUNTER — Other Ambulatory Visit: Payer: Self-pay | Admitting: Family Medicine

## 2022-11-29 DIAGNOSIS — N393 Stress incontinence (female) (male): Secondary | ICD-10-CM

## 2022-11-30 ENCOUNTER — Other Ambulatory Visit: Payer: Self-pay | Admitting: Family Medicine

## 2022-11-30 DIAGNOSIS — Z1231 Encounter for screening mammogram for malignant neoplasm of breast: Secondary | ICD-10-CM

## 2022-12-03 ENCOUNTER — Other Ambulatory Visit: Payer: Self-pay | Admitting: Family Medicine

## 2022-12-03 DIAGNOSIS — I10 Essential (primary) hypertension: Secondary | ICD-10-CM

## 2022-12-17 ENCOUNTER — Ambulatory Visit
Admission: RE | Admit: 2022-12-17 | Discharge: 2022-12-17 | Disposition: A | Discharge status: Home / Self Care | Payer: Medicare Other | Source: Ambulatory Visit | Attending: Family Medicine | Admitting: Family Medicine

## 2022-12-17 DIAGNOSIS — Z1231 Encounter for screening mammogram for malignant neoplasm of breast: Secondary | ICD-10-CM

## 2022-12-22 ENCOUNTER — Other Ambulatory Visit: Payer: Self-pay | Admitting: Family Medicine

## 2022-12-22 DIAGNOSIS — R928 Other abnormal and inconclusive findings on diagnostic imaging of breast: Secondary | ICD-10-CM

## 2022-12-29 ENCOUNTER — Ambulatory Visit
Admission: RE | Admit: 2022-12-29 | Discharge: 2022-12-29 | Disposition: A | Discharge status: Home / Self Care | Payer: Medicare Other | Source: Ambulatory Visit | Attending: Family Medicine | Admitting: Family Medicine

## 2022-12-29 ENCOUNTER — Other Ambulatory Visit: Payer: Self-pay | Admitting: Family Medicine

## 2022-12-29 DIAGNOSIS — N6459 Other signs and symptoms in breast: Secondary | ICD-10-CM | POA: Diagnosis not present

## 2022-12-29 DIAGNOSIS — R928 Other abnormal and inconclusive findings on diagnostic imaging of breast: Secondary | ICD-10-CM

## 2022-12-29 DIAGNOSIS — R599 Enlarged lymph nodes, unspecified: Secondary | ICD-10-CM | POA: Diagnosis not present

## 2022-12-29 DIAGNOSIS — R590 Localized enlarged lymph nodes: Secondary | ICD-10-CM

## 2022-12-31 ENCOUNTER — Other Ambulatory Visit (HOSPITAL_COMMUNITY)
Admission: RE | Admit: 2022-12-31 | Discharge: 2022-12-31 | Disposition: A | Discharge status: Home / Self Care | Payer: Medicare Other | Source: Ambulatory Visit | Attending: Family Medicine | Admitting: Family Medicine

## 2022-12-31 ENCOUNTER — Ambulatory Visit
Admission: RE | Admit: 2022-12-31 | Discharge: 2022-12-31 | Disposition: A | Discharge status: Home / Self Care | Payer: Medicare Other | Source: Ambulatory Visit | Attending: Family Medicine | Admitting: Family Medicine

## 2022-12-31 ENCOUNTER — Other Ambulatory Visit: Payer: Self-pay | Admitting: Family Medicine

## 2022-12-31 DIAGNOSIS — R590 Localized enlarged lymph nodes: Secondary | ICD-10-CM | POA: Diagnosis not present

## 2022-12-31 DIAGNOSIS — I898 Other specified noninfective disorders of lymphatic vessels and lymph nodes: Secondary | ICD-10-CM | POA: Diagnosis not present

## 2022-12-31 DIAGNOSIS — N393 Stress incontinence (female) (male): Secondary | ICD-10-CM

## 2023-01-10 NOTE — Progress Notes (Unsigned)
Cardiology Office Note   Date:  01/14/2023   ID:  Tina [name], DOB December 04, 1945, MRN [id number]  PCP:  Zola Button, Grayling Congress, DO  Cardiologist:   Dietrich Pates, MD  Pt presents for f/u of CAD   History of Present Illness: Tina [name] is a 77 y.o. female with a history of hypertension, hyperlipidemia, goiter and CAD (s/p NSTEMI in 2004 with PTCA/stent to LAD).      I saw the pt in June 2023  Since seen she denies CP  She is active   Walks 8000 to 10000 steps per day.    Does yard work Breathing is stable    No palpitations    Current Meds  Medication Sig   alendronate (FOSAMAX) 70 MG tablet TAKE 1 TABLET BY MOUTH 1 TIME WEEKLY ON AN EMPTY STOMACH AND WITH FULL GLASS OF WATER   aspirin 81 MG tablet Take 81 mg by mouth daily.   Calcium Carbonate-Vit D-Min (CALCIUM 1200 PO) Take 1,200 mg by mouth daily. 1 tab po qd   Multiple Vitamin (MULTIVITAMIN) capsule Take 1 capsule by mouth daily.   Omega-3 Fatty Acids (FISH OIL) 1000 MG CAPS Take 2,000 mg by mouth daily.   ramipril (ALTACE) 5 MG capsule TAKE 1 CAPSULE(5 MG) BY MOUTH DAILY   rosuvastatin (CRESTOR) 20 MG tablet TAKE 1 TABLET(20 MG) BY MOUTH DAILY     Allergies:   Patient has no known allergies.   Past Medical History:  Diagnosis Date   Allergy    seasonal   Cataract    removed both eyes   Fatty liver    Goiter    treated   HTN (hypertension)    Hyperlipidemia    Myocardial infarct ([facility]) hx of 2004   NSTEMI with stent   Osteoporosis    Patellar fracture    Sleep apnea    no cpap   Wrist fracture    right wrist    Past Surgical History:  Procedure Laterality Date   CARDIAC CATHETERIZATION  2004   stent   CATARACT EXTRACTION, BILATERAL  05/2020   COLONOSCOPY     POLYPECTOMY     PROCTOSCOPY N/A 07/12/2019   Procedure: RIGID PROCTOSCOPY;  Surgeon: Karie Soda, MD;  Location: [facility];  Service: General;  Laterality: N/A;   sleep study  2012     Social History:  The patient  reports that she quit smoking  about 20 years ago. Her smoking use included cigarettes. She started smoking about 50 years ago. She has a 30 pack-year smoking history. She quit smokeless tobacco use about 20 years ago. She reports current alcohol use. She reports that she does not use drugs.   Family History:  The patient's family history includes Arthritis in her brother; Cancer (age of onset: 56) in her sister; Cancer (age of onset: 62) in her father; Coronary artery disease in an other family member; Diabetes in her maternal grandmother; Heart disease in her father; Heart disease (age of onset: 71) in her mother; Hypertension in her brother, father, mother, and sister; Lung cancer in an other family member; Ovarian cancer in her mother and another family member.    ROS:  Please see the history of present illness. All other systems are reviewed and  Negative to the above problem except as noted.    PHYSICAL EXAM: VS:  BP (!) 144/82   Pulse (!) 55   Ht 5\' 5"  (1.651 m)   Wt 159 lb (72.1 kg)  SpO2 98%   BMI 26.46 kg/m   GEN:    pt is in no acute distress  HEENT: normal  Neck: no JVD, carotid bruit Cardiac: RRR; no murmur   No LE edema  Respiratory:  clear to auscultation  GI: soft, nontender, nondistended No hepatomegaly    EKG:  EKG is ordered today.  Sinus bradycardia 55 bpm.    Lipid Panel    Component Value Date/Time   CHOL 188 06/05/2022 1403   TRIG 53 06/05/2022 1403   HDL 105 06/05/2022 1403   CHOLHDL 1.8 06/05/2022 1403   VLDL 13.0 06/05/2021 1015   LDLCALC 70 06/05/2022 1403   LDLDIRECT 123.7 05/30/2013 0811      Wt Readings from Last 3 Encounters:  01/14/23 159 lb (72.1 kg)  06/05/22 156 lb 12.8 oz (71.1 kg)  12/15/21 156 lb 12.8 oz (71.1 kg)      ASSESSMENT AND PLAN:  1.  CAD Remote intervention in early 2000s   Pt without angina    FOllow   2 HTN   Pt did not take meds today   Keep on same regimen  3  HL  LDL in Jan 2024 was 70  HDL 105   Follow Keep on current meds   Follow up in  1 year   Current medicines are reviewed at length with the patient today.  The patient does not have concerns regarding medicines.  Signed, Dietrich Pates, MD  01/14/2023 10:15 PM    [facility] [address] [place], [place], [place]  [address] Phone: [phone]; Fax: [phone]

## 2023-01-14 ENCOUNTER — Encounter: Payer: Self-pay | Admitting: Internal Medicine

## 2023-01-14 ENCOUNTER — Ambulatory Visit: Payer: Medicare Other | Attending: Internal Medicine | Admitting: Internal Medicine

## 2023-01-14 VITALS — BP 144/82 | HR 55 | Ht 65.0 in | Wt 159.0 lb

## 2023-01-14 DIAGNOSIS — Z79899 Other long term (current) drug therapy: Secondary | ICD-10-CM

## 2023-01-14 DIAGNOSIS — I252 Old myocardial infarction: Secondary | ICD-10-CM | POA: Diagnosis not present

## 2023-01-14 DIAGNOSIS — I255 Ischemic cardiomyopathy: Secondary | ICD-10-CM

## 2023-01-14 DIAGNOSIS — I10 Essential (primary) hypertension: Secondary | ICD-10-CM | POA: Diagnosis not present

## 2023-01-14 DIAGNOSIS — E782 Mixed hyperlipidemia: Secondary | ICD-10-CM | POA: Diagnosis not present

## 2023-01-14 LAB — SURGICAL PATHOLOGY

## 2023-01-14 NOTE — Patient Instructions (Signed)
Medication Instructions:   *If you need a refill on your cardiac medications before your next appointment, please call your pharmacy*   Lab Work:  If you have labs (blood work) drawn today and your tests are completely normal, you will receive your results only by: MyChart Message (if you have MyChart) OR A paper copy in the mail If you have any lab test that is abnormal or we need to change your treatment, we will call you to review the results.   Testing/Procedures:    Follow-Up: At [facility], you and your health needs are our priority.  As part of our continuing mission to provide you with exceptional heart care, we have created designated Provider Care Teams.  These Care Teams include your primary Cardiologist (physician) and Advanced Practice Providers (APPs -  Physician Assistants and Nurse Practitioners) who all work together to provide you with the care you need, when you need it.  We recommend signing up for the patient portal called "MyChart".  Sign up information is provided on this After Visit Summary.  MyChart is used to connect with patients for Virtual Visits (Telemedicine).  Patients are able to view lab/test results, encounter notes, upcoming appointments, etc.  Non-urgent messages can be sent to your provider as well.   To learn more about what you can do with MyChart, go to [email or web address].    Your next appointment:   1 year(s)  Provider:   DR Dietrich Pates    Other Instructions

## 2023-01-20 DIAGNOSIS — H26493 Other secondary cataract, bilateral: Secondary | ICD-10-CM | POA: Diagnosis not present

## 2023-01-20 DIAGNOSIS — H40023 Open angle with borderline findings, high risk, bilateral: Secondary | ICD-10-CM | POA: Diagnosis not present

## 2023-01-20 DIAGNOSIS — H524 Presbyopia: Secondary | ICD-10-CM | POA: Diagnosis not present

## 2023-01-20 DIAGNOSIS — H35372 Puckering of macula, left eye: Secondary | ICD-10-CM | POA: Diagnosis not present

## 2023-01-20 DIAGNOSIS — H52223 Regular astigmatism, bilateral: Secondary | ICD-10-CM | POA: Diagnosis not present

## 2023-01-20 DIAGNOSIS — H43813 Vitreous degeneration, bilateral: Secondary | ICD-10-CM | POA: Diagnosis not present

## 2023-01-26 ENCOUNTER — Other Ambulatory Visit: Payer: Self-pay | Admitting: Surgery

## 2023-01-26 ENCOUNTER — Telehealth: Payer: Self-pay | Admitting: *Deleted

## 2023-01-26 DIAGNOSIS — R590 Localized enlarged lymph nodes: Secondary | ICD-10-CM | POA: Diagnosis not present

## 2023-01-26 NOTE — Telephone Encounter (Signed)
   Pre-operative Risk Assessment    Patient Name: Tina [name]  DOB: 01/09/1946 MRN: [id number]      Request for Surgical Clearance    Procedure:   Radioactive seed guided excisional biopsy left axillary lymph node surgery  Date of Surgery:  Clearance TBD                                 Surgeon:  Dr. Abigail Miyamoto Surgeon's Group or Practice Name:  [facility] Phone number:  [phone] Fax number:  [phone]   Type of Clearance Requested:   - Medical  - Pharmacy:  Hold Aspirin Not Indicated.   Type of Anesthesia:   LMA & pectoral block   Additional requests/questions:  Patient was seen by Dr. Tenny Craw on 01/14/2023.  Signed, Emmit Pomfret   01/26/2023, 2:47 PM

## 2023-01-27 ENCOUNTER — Other Ambulatory Visit: Payer: Self-pay | Admitting: Surgery

## 2023-01-27 DIAGNOSIS — R590 Localized enlarged lymph nodes: Secondary | ICD-10-CM

## 2023-01-27 NOTE — Telephone Encounter (Signed)
Dr. Tenny Craw,  You saw this patient on 01/14/2023. Will you please comment on medical clearance for radioactive seed guided excisional biopsy left axillary lymph node surgery not yet scheduled?  Please route your response to P CV DIV Preop. I will communicate with requesting office once you have given recommendations.   Thank you!  Carlos Levering, NP

## 2023-01-28 NOTE — Telephone Encounter (Signed)
   Patient Name: Tina [name]  DOB: Jun 02, 1945 MRN: [id number]  Primary Cardiologist: None  Chart reviewed as part of pre-operative protocol coverage. Given past medical history and time since last visit, based on ACC/AHA guidelines, Tina [name] is at acceptable risk for the planned procedure without further cardiovascular testing.   Pt was last seen in the office on 01/14/2023 by Dr. Tenny Craw. Per Dr. Tenny Craw "Pt is very active   Walks over 8000 steps. No symptoms of angina. Low cardiac risk with upcoming surgery.  OK to proceed with surgery without further testing."  Regarding ASA therapy, we recommend continuation of ASA throughout the perioperative period.  However, if the surgeon feels that cessation of ASA is required in the perioperative period, it may be stopped 5-7 days prior to surgery with a plan to resume it as soon as felt to be feasible from a surgical standpoint in the post-operative period.  I will route this recommendation to the requesting party via Epic fax function and remove from pre-op pool.  Please call with questions.  Joylene Grapes, NP 01/28/2023, 3:52 PM

## 2023-01-28 NOTE — Telephone Encounter (Signed)
Pt is very active   Walks over 8000 steps  No symptoms of angina  Low cardiac risk with upcoming surgery  OK to proceed with surgery without further testing

## 2023-02-09 ENCOUNTER — Other Ambulatory Visit: Payer: Self-pay

## 2023-02-09 ENCOUNTER — Encounter (HOSPITAL_BASED_OUTPATIENT_CLINIC_OR_DEPARTMENT_OTHER): Payer: Self-pay | Admitting: Surgery

## 2023-02-15 ENCOUNTER — Other Ambulatory Visit: Payer: Self-pay | Admitting: Surgery

## 2023-02-15 ENCOUNTER — Ambulatory Visit
Admission: RE | Admit: 2023-02-15 | Discharge: 2023-02-15 | Disposition: A | Discharge status: Home / Self Care | Payer: Medicare Other | Source: Ambulatory Visit | Attending: Surgery | Admitting: Surgery

## 2023-02-15 DIAGNOSIS — R590 Localized enlarged lymph nodes: Secondary | ICD-10-CM | POA: Diagnosis not present

## 2023-02-15 HISTORY — PX: BREAST BIOPSY: SHX20

## 2023-02-15 NOTE — Progress Notes (Signed)
Ensure presurgery drink given with instruction to complete drink tomorrow morning by 0730. Pt given CHG soap with written instruction. Pt verbalized understanding        Enhanced Recovery after Surgery for Orthopedics Enhanced Recovery after Surgery is a protocol used to improve the stress on your body and your recovery after surgery.  Patient Instructions  The night before surgery:  No food after midnight. ONLY clear liquids after midnight  The day of surgery (if you do NOT have diabetes):  Drink ONE (1) Pre-Surgery Clear Ensure as directed.   This drink was given to you during your hospital  pre-op appointment visit. The pre-op nurse will instruct you on the time to drink the  Pre-Surgery Ensure depending on your surgery time. Finish the drink at the designated time by the pre-op nurse.  Nothing else to drink after completing the  Pre-Surgery Clear Ensure.  The day of surgery (if you have diabetes): Drink ONE (1) Gatorade 2 (G2) as directed. This drink was given to you during your hospital  pre-op appointment visit.  The pre-op nurse will instruct you on the time to drink the   Gatorade 2 (G2) depending on your surgery time. Color of the Gatorade may vary. Red is not allowed. Nothing else to drink after completing the  Gatorade 2 (G2).         If you have questions, please contact your surgeon's office.

## 2023-02-15 NOTE — H&P (Signed)
REFERRING PHYSICIAN: Loreen Freud, MD PROVIDER: Wayne Both, MD MRN: [id number] DOB: 1945-09-09 DATE OF ENCOUNTER: 01/26/2023 Subjective   Chief Complaint: NEW BREAST  History of Present Illness: Tina [name] is a 77 y.o. female who is seen today as an office consultation for evaluation of NEW BREAST  This is a pleasant 77 year old female who undergone recent screening mammography which showed a possible mass in the axilla of the left side. She underwent further imaging which showed a suspected enlarged lymph node. A biopsy of the left axillary lymph node showed atypical lymphoid infiltrates suggestive of a lymphoproliferative disorder. Complete removal of the lymph node was recommended. She reports she has had some mild night sweats and fatigue over the past year. She is otherwise without complaints. Mammograms are otherwise unremarkable. She recently saw her cardiologist and is doing well cardiac standpoint.  Review of Systems: A complete review of systems was obtained from the patient. I have reviewed this information and discussed as appropriate with the patient. See HPI as well for other ROS.  ROS   Medical History: Past Medical History:  Diagnosis Date  Hyperlipidemia  Hypertension  Sleep apnea  Thyroid disease   There is no problem list on file for this patient.  Past Surgical History:  Procedure Laterality Date  CATARACT EXTRACTION  SIGMOID RESECTION / RECTOPEXY    No Known Allergies  Current Outpatient Medications on File Prior to Visit  Medication Sig Dispense Refill  alendronate (FOSAMAX) 70 MG tablet TAKE 1 TABLET BY MOUTH 1 TIME WEEKLY ON AN EMPTY STOMACH AND WITH FULL GLASS OF WATER  aspirin 81 MG chewable tablet Take 81 mg by mouth once daily  calcium carbonate 260 mg calcium (648 mg) tablet Take by mouth  omega-3 fatty acids/fish oil (FISH OIL) 340-1,000 mg capsule Take 2,000 mg by mouth once daily  ramipriL (ALTACE) 5 MG capsule TAKE 1 CAPSULE(5  MG) BY MOUTH DAILY  rosuvastatin (CRESTOR) 20 MG tablet Take 20 mg by mouth once daily   No current facility-administered medications on file prior to visit.   Family History  Problem Relation Age of Onset  High blood pressure (Hypertension) Mother  Coronary Artery Disease (Blocked arteries around heart) Father  Skin cancer Sister  High blood pressure (Hypertension) Sister  Hyperlipidemia (Elevated cholesterol) Sister    Social History   Tobacco Use  Smoking Status Former  Types: Cigarettes  Start date: 2004  Smokeless Tobacco Never    Social History   Socioeconomic History  Marital status: Divorced  Tobacco Use  Smoking status: Former  Types: Cigarettes  Start date: 2004  Smokeless tobacco: Never  Substance and Sexual Activity  Alcohol use: Yes  Drug use: Never   Social Determinants of Corporate investment banker Strain: Low Risk (03/18/2021)  Received from [facility]  Overall Financial Resource Strain (CARDIA)  Difficulty of Paying Living Expenses: Not hard at all  Food Insecurity: No Food Insecurity (03/18/2021)  Received from [facility]  Hunger Vital Sign  Worried About Running Out of Food in the Last Year: Never true  Ran Out of Food in the Last Year: Never true  Transportation Needs: No Transportation Needs (03/18/2021)  Received from [facility] - Transportation  Lack of Transportation (Medical): No  Lack of Transportation (Non-Medical): No  Physical Activity: Sufficiently Active (03/18/2021)  Received from [facility]  Exercise Vital Sign  Days of Exercise per Week: 7 days  Minutes of Exercise per Session: 30 min  Stress: No Stress Concern  Present (03/18/2021)  Received from [place] [facility] - Occupational Stress Questionnaire  Feeling of Stress : Not at all  Social Connections: Socially Isolated (03/18/2021)  Received from [facility]  Social Connection and Isolation Panel [NHANES]  Frequency of  Communication with Friends and Family: Once a week  Frequency of Social Gatherings with Friends and Family: Once a week  Attends Religious Services: Never  Database administrator or Organizations: No  Attends Banker Meetings: Never  Marital Status: Divorced   Objective:   Vitals:  01/26/23 1331 01/26/23 1332  BP: 118/80  Pulse: 80  Temp: 36.3 C (97.3 F)  SpO2: 96%  Weight: 73.7 kg (162 lb 6.4 oz)  Height: 165.1 cm (5\' 5" )  PainSc: 0-No pain  PainLoc: Breast   Body mass index is 27.02 kg/m.  Physical Exam   She appears well on physical examination  I can feel 1 small lymph node in her left axilla but no other gross lymphadenopathy and there is no supraclavicular lymphadenopathy  Labs, Imaging and Diagnostic Testing: I have reviewed the pathology report as well as the mammograms and ultrasound  Assessment and Plan:   Diagnoses and all orders for this visit:  Axillary lymphadenopathy   This is a patient with an abnormal lymph node in her left axilla with findings on needle biopsy worrisome for a possible lymphoma. Complete surgical visional biopsy of the left axillary lymph node is recommended for complete histologic evaluation to rule out malignancy. This would be with a radioactive seed guided left axillary lymph node excisional biopsy. I explained the surgical procedure to her in detail. We discussed the risks which includes but is not limited to bleeding, infection, injury to surrounding structures, the biopsy being nondiagnostic, seroma formation, chronic arm swelling, etc. She understands and agrees to proceed with surgery which will be scheduled

## 2023-02-16 ENCOUNTER — Other Ambulatory Visit: Payer: Self-pay

## 2023-02-16 ENCOUNTER — Ambulatory Visit
Admission: RE | Admit: 2023-02-16 | Discharge: 2023-02-16 | Disposition: A | Discharge status: Home / Self Care | Payer: Medicare Other | Source: Ambulatory Visit | Attending: Surgery | Admitting: Surgery

## 2023-02-16 ENCOUNTER — Ambulatory Visit (HOSPITAL_BASED_OUTPATIENT_CLINIC_OR_DEPARTMENT_OTHER): Payer: Medicare Other | Admitting: Anesthesiology

## 2023-02-16 ENCOUNTER — Encounter (HOSPITAL_BASED_OUTPATIENT_CLINIC_OR_DEPARTMENT_OTHER): Payer: Self-pay | Admitting: Surgery

## 2023-02-16 ENCOUNTER — Ambulatory Visit (HOSPITAL_BASED_OUTPATIENT_CLINIC_OR_DEPARTMENT_OTHER)
Admission: RE | Admit: 2023-02-16 | Discharge: 2023-02-16 | Disposition: A | Discharge status: Home / Self Care | Payer: Medicare Other | Attending: Surgery | Admitting: Surgery

## 2023-02-16 ENCOUNTER — Encounter (HOSPITAL_BASED_OUTPATIENT_CLINIC_OR_DEPARTMENT_OTHER)
Admission: RE | Disposition: A | Discharge status: Home / Self Care | Payer: Self-pay | Source: Home / Self Care | Attending: Surgery

## 2023-02-16 DIAGNOSIS — C8284 Other types of follicular lymphoma, lymph nodes of axilla and upper limb: Secondary | ICD-10-CM | POA: Insufficient documentation

## 2023-02-16 DIAGNOSIS — E785 Hyperlipidemia, unspecified: Secondary | ICD-10-CM | POA: Diagnosis not present

## 2023-02-16 DIAGNOSIS — Z87891 Personal history of nicotine dependence: Secondary | ICD-10-CM | POA: Diagnosis not present

## 2023-02-16 DIAGNOSIS — G4730 Sleep apnea, unspecified: Secondary | ICD-10-CM

## 2023-02-16 DIAGNOSIS — G8918 Other acute postprocedural pain: Secondary | ICD-10-CM | POA: Diagnosis not present

## 2023-02-16 DIAGNOSIS — R590 Localized enlarged lymph nodes: Secondary | ICD-10-CM

## 2023-02-16 DIAGNOSIS — I10 Essential (primary) hypertension: Secondary | ICD-10-CM | POA: Diagnosis not present

## 2023-02-16 DIAGNOSIS — C8204 Follicular lymphoma grade I, lymph nodes of axilla and upper limb: Secondary | ICD-10-CM | POA: Diagnosis not present

## 2023-02-16 HISTORY — PX: RADIOACTIVE SEED GUIDED AXILLARY SENTINEL LYMPH NODE: SHX6735

## 2023-02-16 SURGERY — RADIOACTIVE SEED GUIDED AXILLARY SENTINEL LYMPH NODE BIOPSY
Anesthesia: Regional | Laterality: Left

## 2023-02-16 MED ORDER — FENTANYL CITRATE (PF) 100 MCG/2ML IJ SOLN
100.0000 ug | Freq: Once | INTRAMUSCULAR | Status: AC
  Administered 2023-02-16: 50 ug via INTRAVENOUS

## 2023-02-16 MED ORDER — FENTANYL CITRATE (PF) 100 MCG/2ML IJ SOLN
INTRAMUSCULAR | Status: AC
  Filled 2023-02-16: qty 2

## 2023-02-16 MED ORDER — EPHEDRINE SULFATE (PRESSORS) 50 MG/ML IJ SOLN
INTRAMUSCULAR | Status: DC | PRN
  Administered 2023-02-16: 5 mg via INTRAVENOUS

## 2023-02-16 MED ORDER — CEFAZOLIN SODIUM-DEXTROSE 2-4 GM/100ML-% IV SOLN
INTRAVENOUS | Status: AC
  Filled 2023-02-16: qty 100

## 2023-02-16 MED ORDER — DEXAMETHASONE SODIUM PHOSPHATE 10 MG/ML IJ SOLN
INTRAMUSCULAR | Status: DC | PRN
Start: 2023-02-16 — End: 2023-02-16
  Administered 2023-02-16: 10 mg

## 2023-02-16 MED ORDER — ROPIVACAINE HCL 5 MG/ML IJ SOLN
INTRAMUSCULAR | Status: DC | PRN
Start: 2023-02-16 — End: 2023-02-16
  Administered 2023-02-16: 30 mL

## 2023-02-16 MED ORDER — CEFAZOLIN SODIUM-DEXTROSE 2-4 GM/100ML-% IV SOLN
2.0000 g | INTRAVENOUS | Status: AC
  Administered 2023-02-16: 2 g via INTRAVENOUS

## 2023-02-16 MED ORDER — TRAMADOL HCL 50 MG PO TABS
50.0000 mg | ORAL_TABLET | Freq: Four times a day (QID) | ORAL | 0 refills | Status: DC | PRN
Start: 2023-02-16 — End: 2023-03-30

## 2023-02-16 MED ORDER — ACETAMINOPHEN 500 MG PO TABS
1000.0000 mg | ORAL_TABLET | ORAL | Status: AC
  Administered 2023-02-16: 1000 mg via ORAL

## 2023-02-16 MED ORDER — LIDOCAINE HCL (CARDIAC) PF 100 MG/5ML IV SOSY
PREFILLED_SYRINGE | INTRAVENOUS | Status: DC | PRN
  Administered 2023-02-16: 60 mg via INTRAVENOUS

## 2023-02-16 MED ORDER — DEXAMETHASONE SODIUM PHOSPHATE 4 MG/ML IJ SOLN
INTRAMUSCULAR | Status: DC | PRN
  Administered 2023-02-16: 5 mg via INTRAVENOUS

## 2023-02-16 MED ORDER — PROPOFOL 10 MG/ML IV BOLUS
INTRAVENOUS | Status: DC | PRN
Start: 2023-02-16 — End: 2023-02-16
  Administered 2023-02-16: 150 mg via INTRAVENOUS

## 2023-02-16 MED ORDER — ACETAMINOPHEN 500 MG PO TABS
ORAL_TABLET | ORAL | Status: AC
  Filled 2023-02-16: qty 2

## 2023-02-16 MED ORDER — BUPIVACAINE-EPINEPHRINE 0.5% -1:200000 IJ SOLN
INTRAMUSCULAR | Status: DC | PRN
  Administered 2023-02-16: 8 mL

## 2023-02-16 MED ORDER — CHLORHEXIDINE GLUCONATE CLOTH 2 % EX PADS: 6.0000 | MEDICATED_PAD | Freq: Once | CUTANEOUS | Status: DC

## 2023-02-16 MED ORDER — LACTATED RINGERS IV SOLN: INTRAVENOUS | Status: DC

## 2023-02-16 MED ORDER — ONDANSETRON HCL 4 MG/2ML IJ SOLN
INTRAMUSCULAR | Status: DC | PRN
  Administered 2023-02-16: 4 mg via INTRAVENOUS

## 2023-02-16 SURGICAL SUPPLY — 44 items
ADH SKN CLS APL DERMABOND .7 (GAUZE/BANDAGES/DRESSINGS) ×1
APL PRP STRL LF DISP 70% ISPRP (MISCELLANEOUS) ×1
APPLIER CLIP 9.375 MED OPEN (MISCELLANEOUS) ×1
APR CLP MED 9.3 20 MLT OPN (MISCELLANEOUS) ×1
BLADE SURG 15 STRL LF DISP TIS (BLADE) ×1 IMPLANT
BLADE SURG 15 STRL SS (BLADE) ×1
CANISTER SUCT 1200ML W/VALVE (MISCELLANEOUS) ×1 IMPLANT
CHLORAPREP W/TINT 26 (MISCELLANEOUS) ×1 IMPLANT
CLIP APPLIE 9.375 MED OPEN (MISCELLANEOUS) IMPLANT
COVER BACK TABLE 60X90IN (DRAPES) ×1 IMPLANT
COVER MAYO STAND STRL (DRAPES) ×1 IMPLANT
DERMABOND ADVANCED .7 DNX12 (GAUZE/BANDAGES/DRESSINGS) ×2 IMPLANT
DRAIN CHANNEL 19F RND (DRAIN) IMPLANT
DRAIN PENROSE 12X.25 LTX STRL (MISCELLANEOUS) IMPLANT
DRAPE LAPAROSCOPIC ABDOMINAL (DRAPES) IMPLANT
DRAPE LAPAROTOMY 100X72 PEDS (DRAPES) ×1 IMPLANT
DRAPE UTILITY XL STRL (DRAPES) ×1 IMPLANT
ELECT REM PT RETURN 9FT ADLT (ELECTROSURGICAL) ×1
ELECTRODE REM PT RTRN 9FT ADLT (ELECTROSURGICAL) ×1 IMPLANT
EVACUATOR SILICONE 100CC (DRAIN) IMPLANT
GLOVE SURG SIGNA 7.5 PF LTX (GLOVE) ×1 IMPLANT
GOWN STRL REUS W/ TWL LRG LVL3 (GOWN DISPOSABLE) ×1 IMPLANT
GOWN STRL REUS W/ TWL XL LVL3 (GOWN DISPOSABLE) ×1 IMPLANT
GOWN STRL REUS W/TWL LRG LVL3 (GOWN DISPOSABLE) ×1
GOWN STRL REUS W/TWL XL LVL3 (GOWN DISPOSABLE) ×1
NDL HYPO 25X1 1.5 SAFETY (NEEDLE) ×1 IMPLANT
NEEDLE HYPO 25X1 1.5 SAFETY (NEEDLE) ×1
NS IRRIG 1000ML POUR BTL (IV SOLUTION) ×1 IMPLANT
PACK BASIN DAY SURGERY FS (CUSTOM PROCEDURE TRAY) ×1 IMPLANT
PENCIL SMOKE EVACUATOR (MISCELLANEOUS) ×1 IMPLANT
PIN SAFETY STERILE (MISCELLANEOUS) ×1 IMPLANT
SLEEVE SCD COMPRESS KNEE MED (STOCKING) ×1 IMPLANT
SPIKE FLUID TRANSFER (MISCELLANEOUS) IMPLANT
SPONGE T-LAP 4X18 ~~LOC~~+RFID (SPONGE) ×1 IMPLANT
STAPLER VISISTAT 35W (STAPLE) IMPLANT
SUT ETHILON 2 0 FS 18 (SUTURE) IMPLANT
SUT MNCRL AB 4-0 PS2 18 (SUTURE) ×1 IMPLANT
SUT VIC AB 3-0 SH 27 (SUTURE) ×1
SUT VIC AB 3-0 SH 27X BRD (SUTURE) IMPLANT
SYR BULB EAR ULCER 3OZ GRN STR (SYRINGE) IMPLANT
SYR CONTROL 10ML LL (SYRINGE) ×1 IMPLANT
TOWEL GREEN STERILE FF (TOWEL DISPOSABLE) ×1 IMPLANT
TUBE CONNECTING 20X1/4 (TUBING) ×1 IMPLANT
YANKAUER SUCT BULB TIP NO VENT (SUCTIONS) ×1 IMPLANT

## 2023-02-16 NOTE — Anesthesia Procedure Notes (Signed)
Procedure Name: LMA Insertion Date/Time: 02/16/2023 11:44 AM  Performed by: Earmon Phoenix, CRNAPre-anesthesia Checklist: Patient identified, Emergency Drugs available, Suction available, Patient being monitored and Timeout performed Patient Re-evaluated:Patient Re-evaluated prior to induction Oxygen Delivery Method: Circle system utilized Preoxygenation: Pre-oxygenation with 100% oxygen Induction Type: IV induction Ventilation: Mask ventilation without difficulty LMA: LMA flexible inserted LMA Size: 4.0 Number of attempts: 1 Placement Confirmation: positive ETCO2 and breath sounds checked- equal and bilateral Tube secured with: Tape Dental Injury: Teeth and Oropharynx as per pre-operative assessment

## 2023-02-16 NOTE — Transfer of Care (Signed)
Immediate Anesthesia Transfer of Care Note  Patient: Tina [name]  Procedure(s) Performed: RADIOACTIVE SEED GUIDED EXCISIONAL BIOPSY LEFT AXILLARY LYMPH NODE (Left)  Patient Location: PACU  Anesthesia Type:General  Level of Consciousness: awake and patient cooperative  Airway & Oxygen Therapy: Patient Spontanous Breathing and Patient connected to face mask oxygen  Post-op Assessment: Report given to RN and Post -op Vital signs reviewed and stable  Post vital signs: Reviewed and stable  Last Vitals:  Vitals Value Taken Time  BP 133/56 02/16/23 1230  Temp    Pulse 79 02/16/23 1231  Resp 12 02/16/23 1231  SpO2 100 % 02/16/23 1231  Vitals shown include unfiled device data.  Last Pain:  Vitals:   02/16/23 0948  TempSrc: Oral  PainSc: 0-No pain      Patients Stated Pain Goal: 3 (02/16/23 0948)  Complications: No notable events documented.

## 2023-02-16 NOTE — Interval H&P Note (Signed)
History and Physical Interval Note: no change in H and P  02/16/2023 9:52 AM  [name]  has presented today for surgery, with the diagnosis of LEFT AXILLARY LYMPHADENOPATHY.  The various methods of treatment have been discussed with the patient and family. After consideration of risks, benefits and other options for treatment, the patient has consented to  Procedure(s): RADIOACTIVE SEED GUIDED EXCISIONAL BIOPSY LEFT AXILLARY LYMPH NODE (Left) as a surgical intervention.  The patient's history has been reviewed, patient examined, no change in status, stable for surgery.  I have reviewed the patient's chart and labs.  Questions were answered to the patient's satisfaction.     Tina [name]

## 2023-02-16 NOTE — Anesthesia Procedure Notes (Signed)
Anesthesia Regional Block: Pectoralis block   Pre-Anesthetic Checklist: , timeout performed,  Correct Patient, Correct Site, Correct Laterality,  Correct Procedure, Correct Position, site marked,  Risks and benefits discussed,  Surgical consent,  Pre-op evaluation,  At surgeon's request and post-op pain management  Laterality: Left  Prep: Dura Prep       Needles:  Injection technique: Single-shot  Needle Type: Echogenic Stimulator Needle     Needle Length: 5cm  Needle Gauge: 20     Additional Needles:   Procedures:,,,, ultrasound used (permanent image in chart),,    Narrative:  Start time: 02/16/2023 10:50 AM End time: 02/16/2023 10:53 AM Injection made incrementally with aspirations every 5 mL.  Performed by: Personally  Anesthesiologist: Atilano Median, DO  Additional Notes: Patient identified. Risks/Benefits/Options discussed with patient including but not limited to bleeding, infection, nerve damage, failed block, incomplete pain control. Patient expressed understanding and wished to proceed. All questions were answered. Sterile technique was used throughout the entire procedure. Please see nursing notes for vital signs. Aspirated in 5cc intervals with injection for negative confirmation. Patient was given instructions on fall risk and not to get out of bed. All questions and concerns addressed with instructions to call with any issues or inadequate analgesia.

## 2023-02-16 NOTE — Op Note (Signed)
   Tina [name] 02/16/2023   Pre-op Diagnosis: LEFT AXILLARY LYMPHADENOPATHY     Post-op Diagnosis: same  Procedure(s): RADIOACTIVE SEED GUIDED EXCISIONAL BIOPSY DEEP LEFT AXILLARY LYMPH NODE  Surgeon(s): Abigail Miyamoto, MD  Anesthesia: General  Staff:  Circulator: Albin Felling, RN; Maryan Rued, RN Scrub Person: Tyler Aas, RN  Estimated Blood Loss: Minimal               Specimens: sent to path  Indications: This is a 77 year old female who was found on recent screen mammography to have adenopathy in the left axilla.  An ultrasound showed an abnormally thickened lymph node which was then biopsied with core biopsy.  The pathology showed a lymphoproliferative disorder.  The decision was made to proceed with complete surgical excision of the lymph node for complete histologic evaluation to rule out a malignancy  Procedure: The patient was brought to the operating room identified the correct patient.  She was placed upon the operating table and general anesthesia was induced.  Her left axilla was then prepped and draped in usual sterile fashion.  I anesthetized skin the axilla with Marcaine and made incision with a scalpel.  With the aid of the neoprobe I then dissected down into the deep axillary space.  I identified the enlarged lymph node containing the radioactive seed.  I grasped with a Allis clamp along with some surrounding fatty tissue containing smaller lymph nodes it appeared.  I excised these in its entirety with electrocautery.  I then achieved hemostasis with the cautery and surgical clips.  An x-ray was performed on the specimen confirmed the radioactive seed and previous biopsy clip were in the specimen.  I then sent the node with the surrounding tissue to pathology for evaluation.  Again hemostasis peer to be achieved.  I then closed the subcutaneous tissue with interrupted 3-0 Vicryl sutures and closed the skin with a running 4-0 Monocryl.   Dermabond was then applied.  The patient tolerated the procedure well.  All the counts were correct at the end of the procedure.  The patient was then extubated in the operating room and taken in a stable condition to the recovery room.          Abigail Miyamoto   Date: 02/16/2023  Time: 12:11 PM

## 2023-02-16 NOTE — Anesthesia Preprocedure Evaluation (Addendum)
Anesthesia Evaluation  Patient identified by MRN, date of birth, ID band Patient awake    Reviewed: Allergy & Precautions, NPO status , Patient's Chart, lab work & pertinent test results  Airway Mallampati: II  TM Distance: >3 FB Neck ROM: Full    Dental  (+) Upper Dentures   Pulmonary sleep apnea , former smoker   Pulmonary exam normal        Cardiovascular hypertension, Pt. on medications + Past MI   Rhythm:Regular Rate:Normal     Neuro/Psych negative neurological ROS  negative psych ROS   GI/Hepatic negative GI ROS, Neg liver ROS,,,  Endo/Other  negative endocrine ROS    Renal/GU negative Renal ROS  negative genitourinary   Musculoskeletal Left breast lymphadenopathy    Abdominal Normal abdominal exam  (+)   Peds  Hematology Lab Results      Component                Value               Date                      WBC                      5.5                 06/05/2022                HGB                      13.6                06/05/2022                HCT                      38.9                06/05/2022                MCV                      92.8                06/05/2022                PLT                      229                 06/05/2022              Anesthesia Other Findings   Reproductive/Obstetrics                             Anesthesia Physical Anesthesia Plan  ASA: 3  Anesthesia Plan: General and Regional   Post-op Pain Management: Regional block* and Tylenol PO (pre-op)*   Induction: Intravenous  PONV Risk Score and Plan: 3 and Ondansetron, Dexamethasone and Treatment may vary due to age or medical condition  Airway Management Planned: Mask and LMA  Additional Equipment: None  Intra-op Plan:   Post-operative Plan: Extubation in OR  Informed Consent: I have reviewed the patients History and Physical, chart, labs and discussed the procedure including the  risks, benefits and alternatives for the  proposed anesthesia with the patient or authorized representative who has indicated his/her understanding and acceptance.     Dental advisory given  Plan Discussed with: CRNA  Anesthesia Plan Comments:        Anesthesia Quick Evaluation

## 2023-02-16 NOTE — Discharge Instructions (Addendum)
You may shower starting tomorrow  Ice pack, Tylenol, and ibuprofen also for pain  No vigorous activity for 1 week   Post Anesthesia Home Care Instructions  Activity: Get plenty of rest for the remainder of the day. A responsible individual must stay with you for 24 hours following the procedure.  For the next 24 hours, DO NOT: -Drive a car -Advertising copywriter -Drink alcoholic beverages -Take any medication unless instructed by your physician -Make any legal decisions or sign important papers.  Meals: Start with liquid foods such as gelatin or soup. Progress to regular foods as tolerated. Avoid greasy, spicy, heavy foods. If nausea and/or vomiting occur, drink only clear liquids until the nausea and/or vomiting subsides. Call your physician if vomiting continues.  Special Instructions/Symptoms: Your throat may feel dry or sore from the anesthesia or the breathing tube placed in your throat during surgery. If this causes discomfort, gargle with warm salt water. The discomfort should disappear within 24 hours.  If you had a scopolamine patch placed behind your ear for the management of post- operative nausea and/or vomiting:  1. The medication in the patch is effective for 72 hours, after which it should be removed.  Wrap patch in a tissue and discard in the trash. Wash hands thoroughly with soap and water. 2. You may remove the patch earlier than 72 hours if you experience unpleasant side effects which may include dry mouth, dizziness or visual disturbances. 3. Avoid touching the patch. Wash your hands with soap and water after contact with the patch.    Next dose of tylenol if needed at 4:00pm

## 2023-02-16 NOTE — Anesthesia Postprocedure Evaluation (Signed)
Anesthesia Post Note  Patient: Tina [name]  Procedure(s) Performed: RADIOACTIVE SEED GUIDED EXCISIONAL BIOPSY LEFT AXILLARY LYMPH NODE (Left)     Patient location during evaluation: PACU Anesthesia Type: Regional and General Level of consciousness: awake and alert Pain management: pain level controlled Vital Signs Assessment: post-procedure vital signs reviewed and stable Respiratory status: spontaneous breathing, nonlabored ventilation, respiratory function stable and patient connected to nasal cannula oxygen Cardiovascular status: blood pressure returned to baseline and stable Postop Assessment: no apparent nausea or vomiting Anesthetic complications: no   No notable events documented.  Last Vitals:  Vitals:   02/16/23 1236 02/16/23 1304  BP: (!) 134/53 (!) 153/68  Pulse: 74 68  Resp: 10 17  Temp:  (!) 36.1 C  SpO2: 99% 95%    Last Pain:  Vitals:   02/16/23 1304  TempSrc: Temporal  PainSc: 0-No pain                 Earl Lites P Lydell Moga

## 2023-02-16 NOTE — Progress Notes (Signed)
Assisted Dr. Gavin Potters with left, pectoralis, ultrasound guided block. Side rails up, monitors on throughout procedure. See vital signs in flow sheet. Tolerated Procedure well.

## 2023-02-17 ENCOUNTER — Encounter (HOSPITAL_BASED_OUTPATIENT_CLINIC_OR_DEPARTMENT_OTHER): Payer: Self-pay | Admitting: Surgery

## 2023-02-19 LAB — SURGICAL PATHOLOGY

## 2023-02-24 ENCOUNTER — Other Ambulatory Visit: Payer: Self-pay | Admitting: Internal Medicine

## 2023-03-05 ENCOUNTER — Inpatient Hospital Stay: Payer: Medicare Other | Attending: Hematology | Admitting: Hematology

## 2023-03-05 ENCOUNTER — Inpatient Hospital Stay: Payer: Medicare Other

## 2023-03-05 VITALS — BP 180/73 | HR 67 | Temp 97.9°F | Resp 17 | Wt 162.2 lb

## 2023-03-05 DIAGNOSIS — C8294 Follicular lymphoma, unspecified, lymph nodes of axilla and upper limb: Secondary | ICD-10-CM

## 2023-03-05 DIAGNOSIS — E785 Hyperlipidemia, unspecified: Secondary | ICD-10-CM | POA: Diagnosis not present

## 2023-03-05 DIAGNOSIS — Z8249 Family history of ischemic heart disease and other diseases of the circulatory system: Secondary | ICD-10-CM | POA: Diagnosis not present

## 2023-03-05 DIAGNOSIS — Z833 Family history of diabetes mellitus: Secondary | ICD-10-CM | POA: Diagnosis not present

## 2023-03-05 DIAGNOSIS — C8204 Follicular lymphoma grade I, lymph nodes of axilla and upper limb: Secondary | ICD-10-CM | POA: Diagnosis not present

## 2023-03-05 DIAGNOSIS — Z8261 Family history of arthritis: Secondary | ICD-10-CM | POA: Insufficient documentation

## 2023-03-05 DIAGNOSIS — R61 Generalized hyperhidrosis: Secondary | ICD-10-CM | POA: Diagnosis not present

## 2023-03-05 DIAGNOSIS — Z87891 Personal history of nicotine dependence: Secondary | ICD-10-CM | POA: Diagnosis not present

## 2023-03-05 DIAGNOSIS — Z8041 Family history of malignant neoplasm of ovary: Secondary | ICD-10-CM | POA: Diagnosis not present

## 2023-03-05 DIAGNOSIS — I2510 Atherosclerotic heart disease of native coronary artery without angina pectoris: Secondary | ICD-10-CM | POA: Diagnosis not present

## 2023-03-05 DIAGNOSIS — I10 Essential (primary) hypertension: Secondary | ICD-10-CM | POA: Diagnosis not present

## 2023-03-05 DIAGNOSIS — Z801 Family history of malignant neoplasm of trachea, bronchus and lung: Secondary | ICD-10-CM | POA: Diagnosis not present

## 2023-03-05 DIAGNOSIS — Z79899 Other long term (current) drug therapy: Secondary | ICD-10-CM | POA: Insufficient documentation

## 2023-03-05 DIAGNOSIS — Z808 Family history of malignant neoplasm of other organs or systems: Secondary | ICD-10-CM | POA: Insufficient documentation

## 2023-03-05 DIAGNOSIS — I252 Old myocardial infarction: Secondary | ICD-10-CM | POA: Insufficient documentation

## 2023-03-05 DIAGNOSIS — R5383 Other fatigue: Secondary | ICD-10-CM | POA: Insufficient documentation

## 2023-03-05 DIAGNOSIS — R0602 Shortness of breath: Secondary | ICD-10-CM | POA: Insufficient documentation

## 2023-03-05 DIAGNOSIS — K5900 Constipation, unspecified: Secondary | ICD-10-CM | POA: Insufficient documentation

## 2023-03-05 LAB — CMP (CANCER CENTER ONLY)
ALT: 20 U/L (ref 0–44)
AST: 20 U/L (ref 15–41)
Albumin: 4.5 g/dL (ref 3.5–5.0)
Alkaline Phosphatase: 59 U/L (ref 38–126)
Anion gap: 6 (ref 5–15)
BUN: 11 mg/dL (ref 8–23)
CO2: 30 mmol/L (ref 22–32)
Calcium: 10.1 mg/dL (ref 8.9–10.3)
Chloride: 99 mmol/L (ref 98–111)
Creatinine: 0.61 mg/dL (ref 0.44–1.00)
GFR, Estimated: >60 mL/min (ref >60)
Glucose, Bld: 88 mg/dL (ref 70–99)
Potassium: 4.3 mmol/L (ref 3.5–5.1)
Sodium: 135 mmol/L (ref 135–145)
Total Bilirubin: 0.7 mg/dL (ref 0.3–1.2)
Total Protein: 7.2 g/dL (ref 6.5–8.1)

## 2023-03-05 LAB — LACTATE DEHYDROGENASE: LDH: 159 U/L (ref 98–192)

## 2023-03-05 LAB — HEPATITIS C ANTIBODY: HCV Ab: NONREACTIVE

## 2023-03-05 LAB — HEPATITIS B CORE ANTIBODY, TOTAL: Hep B Core Total Ab: NONREACTIVE

## 2023-03-05 LAB — CBC WITH DIFFERENTIAL (CANCER CENTER ONLY)
Abs Immature Granulocytes: 0 10*3/uL (ref 0.00–0.07)
Basophils Absolute: 0 10*3/uL (ref 0.0–0.1)
Basophils Relative: 1 %
Eosinophils Absolute: 0 10*3/uL (ref 0.0–0.5)
Eosinophils Relative: 1 %
HCT: 37.9 % (ref 36.0–46.0)
Hemoglobin: 13.1 g/dL (ref 12.0–15.0)
Immature Granulocytes: 0 %
Lymphocytes Relative: 30 %
Lymphs Abs: 1.3 10*3/uL (ref 0.7–4.0)
MCH: 31.6 pg (ref 26.0–34.0)
MCHC: 34.6 g/dL (ref 30.0–36.0)
MCV: 91.5 fL (ref 80.0–100.0)
Monocytes Absolute: 0.3 10*3/uL (ref 0.1–1.0)
Monocytes Relative: 8 %
Neutro Abs: 2.7 10*3/uL (ref 1.7–7.7)
Neutrophils Relative %: 60 %
Platelet Count: 227 10*3/uL (ref 150–400)
RBC: 4.14 MIL/uL (ref 3.87–5.11)
RDW: 12.2 % (ref 11.5–15.5)
WBC Count: 4.4 10*3/uL (ref 4.0–10.5)
nRBC: 0 % (ref 0.0–0.2)

## 2023-03-05 LAB — HEPATITIS B SURFACE ANTIGEN: Hepatitis B Surface Ag: NONREACTIVE

## 2023-03-05 LAB — HIV ANTIBODY (ROUTINE TESTING W REFLEX): HIV Screen 4th Generation wRfx: NONREACTIVE

## 2023-03-05 NOTE — Progress Notes (Signed)
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   Recent mammogram did show findings of a persistent mass in the low left axilla with appearance of an increasingly prominent lymph node. Targeted US showed a left axillary lymph node with diffuse, heterogenous cortical thickening to 4.5 mm. Patient had a biopsy of the left axillary lymph node, which showed atypical lymphoid infiltrates suggestive of a lymphoproliferative disorder. Excisional biopsy did show findings of low-grade follicular lymphoma.   Today, she reports that she has been doing well overall. She denies any pain from her recent biopsy on 9/17.   Patient complains of a lack of energy, fatigue, and reduced strength. Her symptoms have worsened over the last year. Activates that have become more difficult due to fatigue include mowing the lawn and using the treadmill. The severity of her fatigue fluctuates. She notes that she generally aims to take 10,000 steps in a day, but has not been able to do so over the last couple of weeks due to lack of energy.   She complains of a gradual worsening of exertional SOB when going uphill.   Patient has endorsed night sweats over the last 7 months to 1 year.  Her night sweats are not drenching and she does feel warmth with her sweats. She denies any unexplained fever or chills.  Patient endorses mild constipation, which is a new symptom beginning a couple months ago. She notes having rectosigmoid resection surgery 3 years ago for large benign polyps.   Patient complains of pain in her groin on one side when bending down, present for 1 month.   Her osteoporosis is managed by Fosamax, which she has been taking for over two years and potentially over 5 years. She notes that there were considerations of Teriparatide previously, which she is not taking at this time. Patient notes that she has fractured her knees previously. Her last bone density study was two years ago. Patient takes a calcium supplement which has a vitamin D component. She does not generally consume dairy in her diet.   Patient denies any leg/hand swelling and denies any axillary lymphedema. She denies any abdominal pain/distention, back pain or other new lumps/bumps. Patient does note having lumbar issues. She denies any other notable new changes in the last 6-12 months. Her coronary artery disease has been stable and she denies any other recent medical issues that have been a limiting factor.   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  MEDICAL HISTORY:  Past Medical History:  Diagnosis Date   Allergy    seasonal   Cataract    removed both eyes   Fatty liver    Goiter    treated   HTN (hypertension)    Hyperlipidemia    Myocardial infarct (HCC) hx of 2004   NSTEMI with stent   Osteoporosis    Patellar  fracture    Sleep apnea    no cpap   Wrist fracture    right wrist    SURGICAL HISTORY: Past Surgical History:  Procedure Laterality Date   BREAST BIOPSY Left 02/15/2023   [place] LT RADIOACTIVE SEED LOC 02/15/2023 [facility]MO[facility]   CARDIAC CATHETERIZATION  2004   stent   CATARACT EXTRACTION, BILATERAL  05/2020   COLONOSCOPY     POLYPECTOMY     PROCTOSCOPY N/A  07/12/2019   Procedure: RIGID PROCTOSCOPY;  Surgeon: Karie Soda, MD;  Location: [facility];  Service: General;  Laterality: N/A;   RADIOACTIVE SEED GUIDED AXILLARY SENTINEL LYMPH NODE Left 02/16/2023   Procedure: RADIOACTIVE SEED GUIDED EXCISIONAL BIOPSY LEFT AXILLARY LYMPH NODE;  Surgeon: Abigail Miyamoto, MD;  Location: [facility];  Service: General;  Laterality: Left;   sleep study  2012    SOCIAL HISTORY: Social History   Socioeconomic History   Marital status: Divorced    Spouse name: Not on file   Number of children: Not on file   Years of education: Not on file   Highest education level: Not on file  Occupational History   Occupation: nurse    Employer: OTHER    Comment: [employer]   Tobacco Use   Smoking status: Former    Current packs/day: 0.00    Average packs/day: 1 pack/day for 30.0 years (30.0 ttl pk-yrs)    Types: Cigarettes    Start date: 12/12/1972    Quit date: 12/13/2002    Years since quitting: 20.2   Smokeless tobacco: Former    Quit date: 06/01/2002  Vaping Use   Vaping status: Never Used  Substance and Sexual Activity   Alcohol use: Yes    Comment: 1-2 glasses of wine with dinner   Drug use: No   Sexual activity: Not Currently    Partners: Male  Other Topics Concern   Not on file  Social History Narrative   Occupation:[employer]   Divorced   Former Smoker quit 2004   Drug use-no   Regular exercise-treadmill 3x a week   Originially from [place]   Social Determinants of Health   Financial Resource Strain: Low Risk  (03/18/2021)   Overall Financial Resource Strain (CARDIA)    Difficulty of Paying Living Expenses: Not hard at all  Food Insecurity: No Food Insecurity (03/18/2021)   Hunger Vital Sign    Worried About Radiation protection practitioner of Food in the Last Year: Never true    Ran Out of Food in the Last Year: Never true  Transportation Needs: No Transportation Needs (03/18/2021)   PRAPARE - Scientist, research (physical sciences) (Medical): No    Lack of Transportation (Non-Medical): No  Physical Activity: Sufficiently Active (03/18/2021)   Exercise Vital Sign    Days of Exercise per Week: 7 days    Minutes of Exercise per Session: 30 min  Stress: No Stress Concern Present (03/18/2021)   [employer] of Occupational Health - Occupational Stress Questionnaire    Feeling of Stress : Not at all  Social Connections: Socially Isolated (03/18/2021)   Social Connection and Isolation Panel [NHANES]    Frequency of Communication with Friends and Family: Once a week    Frequency of Social Gatherings with Friends and Family: Once a week    Attends Religious Services: Never    Database administrator or Organizations: No    Attends Banker Meetings: Never    Marital Status: Divorced  Catering manager  Violence: Not At Risk (03/18/2021)   Humiliation, Afraid, Rape, and Kick questionnaire    Fear of Current or Ex-Partner: No    Emotionally Abused: No    Physically Abused: No    Sexually Abused: No    FAMILY HISTORY: Family History  Problem Relation Age of Onset   Hypertension Mother    Heart disease Mother 35       MI--- died from #3 at 39   Ovarian cancer Mother    Hypertension Father    Cancer Father 1       ovarian   Heart disease Father    Hypertension Sister    Cancer Sister 79       brain tumor   Coronary artery disease Other    Lung cancer Other    Ovarian cancer Other    Diabetes Maternal Grandmother    Hypertension Brother    Arthritis Brother    Colon cancer Neg Hx    Colon polyps Neg Hx    Esophageal cancer Neg Hx    Stomach cancer Neg Hx    Rectal cancer Neg Hx     ALLERGIES:  has No Known Allergies.  MEDICATIONS:  Current Outpatient Medications  Medication Sig Dispense Refill   alendronate (FOSAMAX) 70 MG tablet TAKE 1 TABLET BY MOUTH 1 TIME WEEKLY ON AN EMPTY STOMACH AND WITH FULL GLASS OF WATER 12 tablet 1   aspirin 81 MG tablet Take 81 mg by mouth  daily.     Calcium Carbonate-Vit D-Min (CALCIUM 1200 PO) Take 1,200 mg by mouth daily. 1 tab po qd     Multiple Vitamin (MULTIVITAMIN) capsule Take 1 capsule by mouth daily.     Omega-3 Fatty Acids (FISH OIL) 1000 MG CAPS Take 2,000 mg by mouth daily.     ramipril (ALTACE) 5 MG capsule TAKE 1 CAPSULE(5 MG) BY MOUTH DAILY 90 capsule 1   rosuvastatin (CRESTOR) 20 MG tablet TAKE 1 TABLET(20 MG) BY MOUTH DAILY 90 tablet 3   traMADol (ULTRAM) 50 MG tablet Take 1 tablet (50 mg total) by mouth every 6 (six) hours as needed for moderate pain or severe pain. 15 tablet 0   No current facility-administered medications for this visit.    REVIEW OF SYSTEMS:    10 Point review of Systems was done is negative except as noted above.  PHYSICAL EXAMINATION: ECOG PERFORMANCE STATUS: 1 - Symptomatic but completely ambulatory  . Vitals:   03/05/23 1430  BP: (!) 180/73  Pulse: 67  Resp: 17  Temp: 97.9 F (36.6 C)  SpO2: 99%   Filed Weights   03/05/23 1430  Weight: 162 lb 3.2 oz (73.6 kg)   .Body mass index is 26.99 kg/m.  GENERAL:alert, in no acute distress and comfortable SKIN: no acute rashes, no significant lesions EYES: conjunctiva are pink and non-injected, sclera anicteric OROPHARYNX: MMM, no exudates, no oropharyngeal erythema or ulceration NECK: supple, no JVD LYMPH:  no palpable lymphadenopathy in the cervical, axillary or inguinal regions LUNGS: clear to auscultation b/l with normal respiratory effort HEART: regular rate & rhythm ABDOMEN:  normoactive bowel sounds , non tender, not distended. Extremity: no pedal edema PSYCH: alert & oriented x 3 with fluent speech NEURO: no focal motor/sensory deficits  LABORATORY DATA:  I have reviewed the data as listed  .    Latest Ref Rng & Units 03/05/2023    3:46 PM 06/05/2022    2:03 PM 06/05/2021   10:15 AM  CBC  WBC 4.0 - 10.5  K/uL 4.4  5.5  6.1   Hemoglobin 12.0 - 15.0 g/dL 11.9  14.7  82.9   Hematocrit 36.0 - 46.0 % 37.9  38.9   38.9   Platelets 150 - 400 K/uL 227  229  278.0     .    Latest Ref Rng & Units 03/05/2023    3:46 PM 06/05/2022    2:03 PM 12/15/2021   11:10 AM  CMP  Glucose 70 - 99 mg/dL 88  85  84   BUN 8 - 23 mg/dL 11  18  12    Creatinine 0.44 - 1.00 mg/dL 5.62  1.30  8.65   Sodium 135 - 145 mmol/L 135  139  137   Potassium 3.5 - 5.1 mmol/L 4.3  4.6  4.3   Chloride 98 - 111 mmol/L 99  99  98   CO2 22 - 32 mmol/L 30  27  30    Calcium 8.9 - 10.3 mg/dL 78.4  69.6  9.6   Total Protein 6.5 - 8.1 g/dL 7.2  7.0  6.7   Total Bilirubin 0.3 - 1.2 mg/dL 0.7  0.6  0.8   Alkaline Phos 38 - 126 U/L 59   33   AST 15 - 41 U/L 20  19  24    ALT 0 - 44 U/L 20  12  13      02/16/2023 LYMPH NODE, LEFT AXILLARY, EXCISION:     RADIOGRAPHIC STUDIES: I have personally reviewed the radiological images as listed and agreed with the findings in the report. MM Breast Surgical Specimen  Result Date: 02/16/2023 CLINICAL DATA:  Evaluate surgical specimen following excision of LEFT axillary lymph node. EXAM: SPECIMEN RADIOGRAPH OF THE LEFT axilla COMPARISON:  Previous exam(s). FINDINGS: Status post excision of the LEFT axilla. The radioactive seed and spiral biopsy clip are present within the specimen. IMPRESSION: Specimen radiograph of the LEFT axilla. Electronically Signed   By: Harmon Pier M.D.   On: 02/16/2023 12:06   [place] LT RADIOACTIVE SEED LOC  Result Date: 02/15/2023 CLINICAL DATA:  77 year old female presents for radioactive seed localization of abnormal LEFT axillary lymph node demonstrating atypical lymphoid infiltrate. EXAM: ULTRASOUND GUIDED RADIOACTIVE SEED LOCALIZATION OF THE LEFT AXILLA DIAGNOSTIC LEFT MAMMOGRAM POST ULTRASOUND-GUIDED RADIOACTIVE SEED PLACEMENT COMPARISON:  Previous exam(s). FINDINGS: Patient presents for radioactive seed localization prior to LEFT axillary lymph node surgical excision. I met with the patient and we discussed the procedure of seed localization including benefits and alternatives.  We discussed the high likelihood of a successful procedure. We discussed the risks of the procedure including infection, bleeding, tissue injury and further surgery. We discussed the low dose of radioactivity involved in the procedure. Informed, written consent was given. The usual time-out protocol was performed immediately prior to the procedure. ULTRASOUND GUIDED RADIOACTIVE SEED LOCALIZATION OF THE LEFT AXILLA Using ultrasound guidance, sterile technique, 1% lidocaine and an I-125 radioactive seed, the abnormal LEFT axillary lymph node was localized using a MEDIAL approach. The follow-up mammogram images confirm the seed in the expected location and were marked for Dr. Magnus Ivan. Follow-up survey of the patient confirms presence of the radioactive seed. Order number of I-125 seed:  [id number]. Total activity:  0.247 millicuries.  Reference Date: 01/21/2023. DIAGNOSTIC LEFT MAMMOGRAM POST ULTRASOUND-GUIDED RADIOACTIVE SEED PLACEMENT Mammographic images were obtained following ultrasound-guided radioactive seed placement. These demonstrate the radioactive seed within the LEFT axillary lymph node containing the HydroMARK biopsy clip. The patient tolerated the procedure well and was released from the [facility]. She was  given instructions regarding seed removal. IMPRESSION: Radioactive seed localization of a LEFT axillary lymph node. No apparent complications. Electronically Signed   By: Harmon Pier M.D.   On: 02/15/2023 14:57   MM CLIP PLACEMENT LEFT  Result Date: 02/15/2023 CLINICAL DATA:  77 year old female presents for radioactive seed localization of abnormal LEFT axillary lymph node demonstrating atypical lymphoid infiltrate. EXAM: ULTRASOUND GUIDED RADIOACTIVE SEED LOCALIZATION OF THE LEFT AXILLA DIAGNOSTIC LEFT MAMMOGRAM POST ULTRASOUND-GUIDED RADIOACTIVE SEED PLACEMENT COMPARISON:  Previous exam(s). FINDINGS: Patient presents for radioactive seed localization prior to LEFT axillary lymph node surgical  excision. I met with the patient and we discussed the procedure of seed localization including benefits and alternatives. We discussed the high likelihood of a successful procedure. We discussed the risks of the procedure including infection, bleeding, tissue injury and further surgery. We discussed the low dose of radioactivity involved in the procedure. Informed, written consent was given. The usual time-out protocol was performed immediately prior to the procedure. ULTRASOUND GUIDED RADIOACTIVE SEED LOCALIZATION OF THE LEFT AXILLA Using ultrasound guidance, sterile technique, 1% lidocaine and an I-125 radioactive seed, the abnormal LEFT axillary lymph node was localized using a MEDIAL approach. The follow-up mammogram images confirm the seed in the expected location and were marked for Dr. Magnus Ivan. Follow-up survey of the patient confirms presence of the radioactive seed. Order number of I-125 seed:  [id number]. Total activity:  0.247 millicuries.  Reference Date: 01/21/2023. DIAGNOSTIC LEFT MAMMOGRAM POST ULTRASOUND-GUIDED RADIOACTIVE SEED PLACEMENT Mammographic images were obtained following ultrasound-guided radioactive seed placement. These demonstrate the radioactive seed within the LEFT axillary lymph node containing the HydroMARK biopsy clip. The patient tolerated the procedure well and was released from the [facility]. She was given instructions regarding seed removal. IMPRESSION: Radioactive seed localization of a LEFT axillary lymph node. No apparent complications. Electronically Signed   By: Harmon Pier M.D.   On: 02/15/2023 14:57    ASSESSMENT & PLAN:  77 y.o. female with:  Follicular low-grade B-cell lymphoma   PLAN: -discussed details of her follicular low-grade B-cell lymphoma diagnosis -patient does have B cell clonality and CD10 positive type of lymphoma -educated pt that 90% of Non-Hodgkin lymphomas are from B lymphocytes, and 10% are from T lymphocytes -educated patient on details  of the lymphoma spectrum and the grading and staging criteria of follicular lymphoma -will plan for PET scan to determine staging of disease -Discussed that there is a less than 5% chance that the low grade follicular lymphoma may transform to high grade through [employer] -discussed that her condition would be treated based on specific criteria. Potential treatment options might include treating locally with local radiation or not treating if disease is not bothersome at all -discussed potential reasons to initiate treatment such as: Presence of bulky disease which could cause local symptoms or complications with organ involvement Disease is causing several constitutional symptoms There is severe debilitating fatigue related to disease without any other confounders -no localizable significant symptoms at this time -1-sided groin pain is likely muscular or may be related to her hip -No significant enlarged lymph nodes in axillary. There is mild induration in the area -discussed that Fosamax generally would not be taken beyond 2-3 years due to concerns of atypical fractures -discussed other potential options including Prolia to manage osteoporosis -educated patient that calcium combination pills generally do not contain adequate levels of vitamin D -discussed vitamin D goal of 60-90 to support bone health and immune system  -would recommend taking at least 2000 units  of vitamin D at the bare minimum, though patient may need 5000 units daily -recommend patient to eat and sleep well -advised patient to stay UTD with age-appropriate vaccines including influenza and COVID-19 booster. Would advise patient to receive COVID-19 booster vaccination after PET scan to avoid any false positive findings on PET scan.  -will order blood tests for further evaluation -discussed the role for hepatitis testing to determine baseline -will order PET scan to determine baseline -answered all of patient's  questions in detail  . Orders Placed This Encounter  Procedures   NM PET Image Initial (PI) Skull Base To Thigh    Standing Status:   Future    Standing Expiration Date:   03/04/2024    Order Specific Question:   If indicated for the ordered procedure, I authorize the administration of a radiopharmaceutical per Radiology protocol    Answer:   Yes    Order Specific Question:   Preferred imaging location?    Answer:   [place]   CBC with Differential (Cancer Center Only)    Standing Status:   Future    Number of Occurrences:   1    Standing Expiration Date:   03/04/2024   CMP (Cancer Center only)    Standing Status:   Future    Number of Occurrences:   1    Standing Expiration Date:   03/04/2024   Lactate dehydrogenase    Standing Status:   Future    Number of Occurrences:   1    Standing Expiration Date:   03/04/2024   Hepatitis C antibody    Standing Status:   Future    Number of Occurrences:   1    Standing Expiration Date:   03/04/2024   Hepatitis B core antibody, total    Standing Status:   Future    Number of Occurrences:   1    Standing Expiration Date:   03/04/2024   Hepatitis B surface antigen    Standing Status:   Future    Number of Occurrences:   1    Standing Expiration Date:   03/04/2024   HIV Antibody (routine testing w rflx)    Standing Status:   Future    Number of Occurrences:   1    Standing Expiration Date:   03/04/2024     FOLLOW-UP: Labs today PET/CT in 1 week RTC with Dr Candise Che in 3 weeks  The total time spent in the appointment was 60 minutes* .  All of the patient's questions were answered with apparent satisfaction. The patient knows to call the clinic with any problems, questions or concerns.   Wyvonnia Lora MD MS AAHIVMS [facility] Hematology/Oncology Physician [facility]  .*Total Encounter Time as defined by the Centers for Medicare and Medicaid Services includes, in addition to the face-to-face time of a patient visit (documented  in the note above) non-face-to-face time: obtaining and reviewing outside history, ordering and reviewing medications, tests or procedures, care coordination (communications with other health care professionals or caregivers) and documentation in the medical record.    I,Mitra Faeizi,acting as a Neurosurgeon for Wyvonnia Lora, MD.,have documented all relevant documentation on the behalf of Wyvonnia Lora, MD,as directed by  Wyvonnia Lora, MD while in the presence of Wyvonnia Lora, MD.  .I have reviewed the above documentation for accuracy and completeness, and I agree with the above. Johney Maine MD

## 2023-03-19 ENCOUNTER — Ambulatory Visit (HOSPITAL_COMMUNITY)
Admission: RE | Admit: 2023-03-19 | Discharge: 2023-03-19 | Disposition: A | Discharge status: Home / Self Care | Payer: Medicare Other | Source: Ambulatory Visit | Attending: Hematology | Admitting: Hematology

## 2023-03-19 DIAGNOSIS — C8295 Follicular lymphoma, unspecified, lymph nodes of inguinal region and lower limb: Secondary | ICD-10-CM | POA: Diagnosis not present

## 2023-03-19 DIAGNOSIS — C8294 Follicular lymphoma, unspecified, lymph nodes of axilla and upper limb: Secondary | ICD-10-CM | POA: Diagnosis not present

## 2023-03-19 LAB — GLUCOSE, CAPILLARY: Glucose-Capillary: 105 mg/dL — ABNORMAL HIGH (ref 70–99)

## 2023-03-19 MED ORDER — FLUDEOXYGLUCOSE F - 18 (FDG) INJECTION
8.0000 | Freq: Once | INTRAVENOUS | Status: AC | PRN
  Administered 2023-03-19: 8 via INTRAVENOUS

## 2023-03-30 ENCOUNTER — Ambulatory Visit (INDEPENDENT_AMBULATORY_CARE_PROVIDER_SITE_OTHER): Payer: Medicare Other

## 2023-03-30 VITALS — Ht 65.0 in | Wt 162.0 lb

## 2023-03-30 DIAGNOSIS — Z13820 Encounter for screening for osteoporosis: Secondary | ICD-10-CM

## 2023-03-30 DIAGNOSIS — Z0000 Encounter for general adult medical examination without abnormal findings: Secondary | ICD-10-CM

## 2023-03-30 NOTE — Patient Instructions (Addendum)
Tina [name] , Thank you for taking time to come for your Medicare Wellness Visit. I appreciate your ongoing commitment to your health goals. Please review the following plan we discussed and let me know if I can assist you in the future.   Referrals/Orders/Follow-Ups/Clinician Recommendations:   This is a list of the screening recommended for you and due dates:  Health Maintenance  Topic Date Due   Zoster (Shingles) Vaccine (1 of 2) [birth year]02/1966   DTaP/Tdap/Td vaccine (2 - Td or Tdap) 10/13/2020   DEXA scan (bone density measurement)  10/31/2022   COVID-19 Vaccine (6 - 2023-24 season) 01/31/2023   Mammogram  12/17/2023   Medicare Annual Wellness Visit  03/29/2024   Colon Cancer Screening  05/15/2024   Pneumonia Vaccine  Completed   Flu Shot  Completed   Hepatitis C Screening  Completed   HPV Vaccine  Aged Out   Cologuard (Stool DNA test)  Discontinued  Opioid Pain Medicine Management Opioids are powerful medicines that are used to treat moderate to severe pain. When used for short periods of time, they can help you to: Sleep better. Do better in physical or occupational therapy. Feel better in the first few days after an injury. Recover from surgery. Opioids should be taken with the supervision of a trained health care provider. They should be taken for the shortest period of time possible. This is because opioids can be addictive, and the longer you take opioids, the greater your risk of addiction. This addiction can also be called opioid use disorder. What are the risks? Using opioid pain medicines for longer than 3 days increases your risk of side effects. Side effects include: Constipation. Nausea and vomiting. Breathing difficulties (respiratory depression). Drowsiness. Confusion. Opioid use disorder. Itching. Taking opioid pain medicine for a long period of time can affect your ability to do daily tasks. It also puts you at risk for: Motor vehicle  crashes. Depression. Suicide. Heart attack. Overdose, which can be life-threatening. What is a pain treatment plan? A pain treatment plan is an agreement between you and your health care provider. Pain is unique to each person, and treatments vary depending on your condition. To manage your pain, you and your health care provider need to work together. To help you do this: Discuss the goals of your treatment, including how much pain you might expect to have and how you will manage the pain. Review the risks and benefits of taking opioid medicines. Remember that a good treatment plan uses more than one approach and minimizes the chance of side effects. Be honest about the amount of medicines you take and about any drug or alcohol use. Get pain medicine prescriptions from only one health care provider. Pain can be managed with many types of alternative treatments. Ask your health care provider to refer you to one or more specialists who can help you manage pain through: Physical or occupational therapy. Counseling (cognitive behavioral therapy). Good nutrition. Biofeedback. Massage. Meditation. Non-opioid medicine. Following a gentle exercise program. How to use opioid pain medicine Taking medicine Take your pain medicine exactly as told by your health care provider. Take it only when you need it. If your pain gets less severe, you may take less than your prescribed dose if your health care provider approves. If you are not having pain, do nottake pain medicine unless your health care provider tells you to take it. If your pain is severe, do nottry to treat it yourself by taking more pills than instructed on  your prescription. Contact your health care provider for help. Write down the times when you take your pain medicine. It is easy to become confused while on pain medicine. Writing the time can help you avoid overdose. Take other over-the-counter or prescription medicines only as told by  your health care provider. Keeping yourself and others safe  While you are taking opioid pain medicine: Do not drive, use machinery, or power tools. Do not sign legal documents. Do not drink alcohol. Do not take sleeping pills. Do not supervise children by yourself. Do not do activities that require climbing or being in high places. Do not go to a lake, river, ocean, spa, or swimming pool. Do not share your pain medicine with anyone. Keep pain medicine in a locked cabinet or in a secure area where pets and children cannot reach it. Stopping your use of opioids If you have been taking opioid medicine for more than a few weeks, you may need to slowly decrease (taper) how much you take until you stop completely. Tapering your use of opioids can decrease your risk of symptoms of withdrawal, such as: Pain and cramping in the abdomen. Nausea. Sweating. Sleepiness. Restlessness. Uncontrollable shaking (tremors). Cravings for the medicine. Do not attempt to taper your use of opioids on your own. Talk with your health care provider about how to do this. Your health care provider may prescribe a step-down schedule based on how much medicine you are taking and how long you have been taking it. Getting rid of leftover pills Do not save any leftover pills. Get rid of leftover pills safely by: Taking the medicine to a prescription take-back program. This is usually offered by the county or law enforcement. Bringing them to a pharmacy that has a drug disposal container. Flushing them down the toilet. Check the label or package insert of your medicine to see whether this is safe to do. Throwing them out in the trash. Check the label or package insert of your medicine to see whether this is safe to do. If it is safe to throw it out, remove the medicine from the original container, put it into a sealable bag or container, and mix it with used coffee grounds, food scraps, dirt, or cat litter before putting  it in the trash. Follow these instructions at home: Activity Do exercises as told by your health care provider. Avoid activities that make your pain worse. Return to your normal activities as told by your health care provider. Ask your health care provider what activities are safe for you. General instructions You may need to take these actions to prevent or treat constipation: Drink enough fluid to keep your urine pale yellow. Take over-the-counter or prescription medicines. Eat foods that are high in fiber, such as beans, whole grains, and fresh fruits and vegetables. Limit foods that are high in fat and processed sugars, such as fried or sweet foods. Keep all follow-up visits. This is important. Where to find support If you have been taking opioids for a long time, you may benefit from receiving support for quitting from a local support group or counselor. Ask your health care provider for a referral to these resources in your area. Where to find more information Centers for Disease Control and Prevention (CDC): FootballExhibition.com.br [place] Food and Drug Administration (FDA): PumpkinSearch.com.ee Get help right away if: You may have taken too much of an opioid (overdosed). Common symptoms of an overdose: Your breathing is slower or more shallow than normal. You have a  very slow heartbeat (pulse). You have slurred speech. You have nausea and vomiting. Your pupils become very small. You have other potential symptoms: You are very confused. You faint or feel like you will faint. You have cold, clammy skin. You have blue lips or fingernails. You have thoughts of harming yourself or harming others. These symptoms may represent a serious problem that is an emergency. Do not wait to see if the symptoms will go away. Get medical help right away. Call your local emergency services (911 in the [place] Do not drive yourself to the hospital.  If you ever feel like you may hurt yourself or others, or have thoughts  about taking your own life, get help right away. Go to your nearest emergency department or: Call your local emergency services (911 in the [place] Call the [facility] (564-001-2271 in the [place] Call a suicide crisis helpline, such as the National Suicide Prevention Lifeline at 916-177-9156 or 988 in the [place] This is open 24 hours a day in the [place] Text the Crisis Text Line at (442)049-8489 (in the [place] Summary Opioid medicines can help you manage moderate to severe pain for a short period of time. A pain treatment plan is an agreement between you and your health care provider. Discuss the goals of your treatment, including how much pain you might expect to have and how you will manage the pain. If you think that you or someone else may have taken too much of an opioid, get medical help right away. This information is not intended to replace advice given to you by your health care provider. Make sure you discuss any questions you have with your health care provider. Document Revised: 12/11/2020 Document Reviewed: 08/28/2020 Elsevier Patient Education  2024 Elsevier Inc.   Advanced directives: (Declined) Advance directive discussed with you today. Even though you declined this today, please call our office should you change your mind, and we can give you the proper paperwork for you to fill out.  Next Medicare Annual Wellness Visit scheduled for next year: Yes

## 2023-03-30 NOTE — Progress Notes (Signed)
Subjective:   Tina [name] is a 77 y.o. female who presents for Medicare Annual (Subsequent) preventive examination.  Visit Complete: Virtual I connected with  Tina [name] on 03/30/23 by a audio enabled telemedicine application and verified that I am speaking with the correct person using two identifiers.  Patient Location: Home  Provider Location: Home Office  I discussed the limitations of evaluation and management by telemedicine. The patient expressed understanding and agreed to proceed.  Vital Signs: Because this visit was a virtual/telehealth visit, some criteria may be missing or patient reported. Any vitals not documented were not able to be obtained and vitals that have been documented are patient reported.  Patient Medicare AWV questionnaire was completed by the patient on 03/26/23; I have confirmed that all information answered by patient is correct and no changes since this date.  Cardiac Risk Factors include: advanced age (>42men, >63 women);hypertension     Objective:    Today's Vitals   03/30/23 0901  Weight: 162 lb (73.5 kg)  Height: 5\' 5"  (1.651 m)   Body mass index is 26.96 kg/m.     03/30/2023    9:11 AM 03/23/2022    9:42 AM 07/03/2021    9:29 AM 05/19/2021    4:49 PM 03/18/2021    3:51 PM 07/12/2019    4:30 PM 07/12/2019   10:52 AM  Advanced Directives  Does Patient Have a Medical Advance Directive? No No No No No No No  Would patient like information on creating a medical advance directive? No - Patient declined No - Patient declined No - Patient declined No - Patient declined No - Patient declined No - Patient declined No - Patient declined    Current Medications (verified) Outpatient Encounter Medications as of 03/30/2023  Medication Sig   alendronate (FOSAMAX) 70 MG tablet TAKE 1 TABLET BY MOUTH 1 TIME WEEKLY ON AN EMPTY STOMACH AND WITH FULL GLASS OF WATER   aspirin 81 MG tablet Take 81 mg by mouth daily.   Calcium Carbonate-Vit D-Min  (CALCIUM 1200 PO) Take 1,200 mg by mouth daily. 1 tab po qd   Multiple Vitamin (MULTIVITAMIN) capsule Take 1 capsule by mouth daily.   Omega-3 Fatty Acids (FISH OIL) 1000 MG CAPS Take 2,000 mg by mouth daily.   ramipril (ALTACE) 5 MG capsule TAKE 1 CAPSULE(5 MG) BY MOUTH DAILY   rosuvastatin (CRESTOR) 20 MG tablet TAKE 1 TABLET(20 MG) BY MOUTH DAILY   [DISCONTINUED] traMADol (ULTRAM) 50 MG tablet Take 1 tablet (50 mg total) by mouth every 6 (six) hours as needed for moderate pain or severe pain.   No facility-administered encounter medications on file as of 03/30/2023.    Allergies (verified) Patient has no known allergies.   History: Past Medical History:  Diagnosis Date   Allergy    seasonal   Cataract    removed both eyes   Fatty liver    Goiter    treated   HTN (hypertension)    Hyperlipidemia    Myocardial infarct (HCC) hx of 2004   NSTEMI with stent   Osteoporosis    Patellar fracture    Sleep apnea    no cpap   Wrist fracture    right wrist   Past Surgical History:  Procedure Laterality Date   BREAST BIOPSY Left 02/15/2023   [place] LT RADIOACTIVE SEED LOC 02/15/2023 [facility]   CARDIAC CATHETERIZATION  2004   stent   CATARACT EXTRACTION, BILATERAL  05/2020   COLONOSCOPY  POLYPECTOMY     PROCTOSCOPY N/A 07/12/2019   Procedure: RIGID PROCTOSCOPY;  Surgeon: Karie Soda, MD;  Location: [facility];  Service: General;  Laterality: N/A;   RADIOACTIVE SEED GUIDED AXILLARY SENTINEL LYMPH NODE Left 02/16/2023   Procedure: RADIOACTIVE SEED GUIDED EXCISIONAL BIOPSY LEFT AXILLARY LYMPH NODE;  Surgeon: Abigail Miyamoto, MD;  Location: [facility];  Service: General;  Laterality: Left;   sleep study  2012   Family History  Problem Relation Age of Onset   Hypertension Mother    Heart disease Mother 52       MI--- died from #3 at 29   Ovarian cancer Mother    Hypertension Father    Cancer Father 8       ovarian   Heart disease Father    Hypertension  Sister    Cancer Sister 93       brain tumor   Coronary artery disease Other    Lung cancer Other    Ovarian cancer Other    Diabetes Maternal Grandmother    Hypertension Brother    Arthritis Brother    Colon cancer Neg Hx    Colon polyps Neg Hx    Esophageal cancer Neg Hx    Stomach cancer Neg Hx    Rectal cancer Neg Hx    Social History   Socioeconomic History   Marital status: Divorced    Spouse name: Not on file   Number of children: Not on file   Years of education: Not on file   Highest education level: Not on file  Occupational History   Occupation: Academic librarian: OTHER    Comment: [employer]   Tobacco Use   Smoking status: Former    Current packs/day: 0.00    Average packs/day: 1 pack/day for 30.0 years (30.0 ttl pk-yrs)    Types: Cigarettes    Start date: 12/12/1972    Quit date: 12/13/2002    Years since quitting: 20.3   Smokeless tobacco: Former    Quit date: 06/01/2002  Vaping Use   Vaping status: Never Used  Substance and Sexual Activity   Alcohol use: Yes    Comment: 1-2 glasses of wine with dinner   Drug use: No   Sexual activity: Not Currently    Partners: Male  Other Topics Concern   Not on file  Social History Narrative   Occupation:[employer]   Divorced   Former Smoker quit 2004   Drug use-no   Regular exercise-treadmill 3x a week   Originially from [place]   Social Determinants of [employer] Strain: Low Risk  (03/26/2023)   Overall Financial Resource Strain (CARDIA)    Difficulty of Paying Living Expenses: Not very hard  Food Insecurity: No Food Insecurity (03/26/2023)   Hunger Vital Sign    Worried About Running Out of Food in the Last Year: Never true    Ran Out of Food in the Last Year: Never true  Transportation Needs: No Transportation Needs (03/26/2023)   PRAPARE - Administrator, Civil Service (Medical): No    Lack of Transportation (Non-Medical): No  Physical Activity: Sufficiently  Active (03/26/2023)   Exercise Vital Sign    Days of Exercise per Week: 5 days    Minutes of Exercise per Session: 40 min  Stress: No Stress Concern Present (03/26/2023)   [employer] of Occupational Health - Occupational Stress Questionnaire    Feeling of Stress : Not at all  Social Connections: Unknown (03/26/2023)   Social Connection and Isolation Panel [NHANES]    Frequency of Communication with Friends and Family: More than three times a week    Frequency of Social Gatherings with Friends and Family: Once a week    Attends Religious Services: Not on Marketing executive or Organizations: No    Attends Engineer, structural: Patient declined    Marital Status: Divorced    Tobacco Counseling Counseling given: Not Answered   Clinical Intake:  Pre-visit preparation completed: Yes  Pain : No/denies pain     BMI - recorded: 26.96 Nutritional Status: BMI 25 -29 Overweight Nutritional Risks: None Diabetes: No  How often do you need to have someone help you when you read instructions, pamphlets, or other written materials from your doctor or pharmacy?: 1 - Never  Interpreter Needed?: No  Information entered by :: Theresa Mulligan LPN   Activities of Daily Living    03/26/2023    4:47 PM 02/16/2023    9:49 AM  In your present state of health, do you have any difficulty performing the following activities:  Hearing? 0 0  Vision? 0 1  Difficulty concentrating or making decisions? 1 0  Walking or climbing stairs? 0 0  Dressing or bathing? 0 0  Doing errands, shopping? 0   Preparing Food and eating ? N   Using the Toilet? N   In the past six months, have you accidently leaked urine? Y   Comment Wears pads. Followed by PCP   Do you have problems with loss of bowel control? N   Managing your Medications? N   Managing your Finances? N   Housekeeping or managing your Housekeeping? N     Patient Care Team: Zola Button, Grayling Congress, DO as PCP -  General Gustavus Bryant, DDS as Consulting Physician (Dentistry) Jimmey Ralph Forest Becker, DO as Consulting Physician (Optometry) Pricilla Riffle, MD as Consulting Physician (Cardiology) Dorisann Frames, MD as Referring Physician (Endocrinology) Myrtie Neither Andreas Blower, MD as Consulting Physician (Gastroenterology) Karie Soda, MD as Consulting Physician (General Surgery)  Indicate any recent Medical Services you may have received from other than [facility] providers in the past year (date may be approximate).     Assessment:   This is a routine wellness examination for Lya.  Hearing/Vision screen Hearing Screening - Comments:: Denies hearing difficulties   Vision Screening - Comments:: Wears reading glasses - up to date with routine eye exams with  [facility]   Goals Addressed               This Visit's Progress     Continue working on my health care plan (pt-stated)         Depression Screen    03/30/2023    9:10 AM 03/23/2022    9:43 AM 03/18/2021    3:55 PM 04/30/2020    3:25 PM 02/27/2019    8:11 AM 02/24/2018    9:24 AM 02/23/2017    9:22 AM  PHQ 2/9 Scores  PHQ - 2 Score 0 0 0 0 0 0 0    Fall Risk    03/30/2023    9:07 AM 03/26/2023    4:47 PM 03/23/2022    9:42 AM 03/18/2021    3:53 PM 04/30/2020    3:25 PM  Fall Risk   Falls in the past year? 0 0 0 1 1  Number falls in past yr: 0  0 1 1  Injury with Fall? 0  0 1 1  Risk for fall due to : No Fall Risks  No Fall Risks History of fall(s) Impaired vision  Follow up Falls prevention discussed  Falls evaluation completed Falls prevention discussed     MEDICARE RISK AT HOME: Medicare Risk at Home Any stairs in or around the home?: Yes If so, are there any without handrails?: No Home free of loose throw rugs in walkways, pet beds, electrical cords, etc?: No Adequate lighting in your home to reduce risk of falls?: Yes Life alert?: No Use of a cane, walker or w/c?: No Grab bars in the bathroom?: No Shower chair or bench  in shower?: No Elevated toilet seat or a handicapped toilet?: No  TIMED UP AND GO:  Was the test performed?  No    Cognitive Function:        03/30/2023    9:11 AM 03/23/2022    9:50 AM  6CIT Screen  What Year? 0 points 0 points  What month? 0 points 0 points  What time? 0 points 0 points  Count back from 20 0 points 0 points  Months in reverse 0 points 0 points  Repeat phrase 0 points 0 points  Total Score 0 points 0 points    Immunizations Immunization History  Administered Date(s) Administered   Fluad Quad(high Dose 65+) 03/14/2019   Influenza, High Dose Seasonal PF 02/25/2015, 04/13/2016, 02/24/2017, 02/28/2018, 03/14/2019   Influenza,inj,Quad PF,6+ Mos 06/05/2014   Influenza-Unspecified 03/30/2017, 03/22/2020, 04/02/2021, 05/05/2022   PFIZER(Purple Top)SARS-COV-2 Vaccination 08/26/2019, 09/16/2019, 10/17/2020   Pfizer Covid-19 Vaccine Bivalent Booster 64yrs & up 04/02/2021, 05/05/2022   Pneumococcal Conjugate-13 02/25/2015   Pneumococcal Polysaccharide-23 01/03/2014   Tdap 10/14/2010   Zoster, Live 10/14/2010    TDAP status: Due, Education has been provided regarding the importance of this vaccine. Advised may receive this vaccine at local pharmacy or Health Dept. Aware to provide a copy of the vaccination record if obtained from local pharmacy or Health Dept. Verbalized acceptance and understanding.  Flu Vaccine status: Up to date  Pneumococcal vaccine status: Up to date  Covid-19 vaccine status: Declined, Education has been provided regarding the importance of this vaccine but patient still declined. Advised may receive this vaccine at local pharmacy or Health Dept.or vaccine clinic. Aware to provide a copy of the vaccination record if obtained from local pharmacy or Health Dept. Verbalized acceptance and understanding.  Qualifies for Shingles Vaccine? Yes   Zostavax completed No   Shingrix Completed?: No.    Education has been provided regarding the importance  of this vaccine. Patient has been advised to call insurance company to determine out of pocket expense if they have not yet received this vaccine. Advised may also receive vaccine at local pharmacy or Health Dept. Verbalized acceptance and understanding.  Screening Tests Health Maintenance  Topic Date Due   Zoster Vaccines- Shingrix (1 of 2) [birth year]02/1965   DTaP/Tdap/Td (2 - Td or Tdap) 10/13/2020   DEXA SCAN  10/31/2022   COVID-19 Vaccine (6 - 2023-24 season) 01/31/2023   MAMMOGRAM  12/17/2023   Medicare Annual Wellness (AWV)  03/29/2024   Colonoscopy  05/15/2024   Pneumonia Vaccine 67+ Years old  Completed   INFLUENZA VACCINE  Completed   Hepatitis C Screening  Completed   HPV VACCINES  Aged Out   Fecal DNA (Cologuard)  Discontinued    Health Maintenance  Health Maintenance Due  Topic Date Due   Zoster Vaccines- Shingrix (1 of 2) [birth year]02/1966  DTaP/Tdap/Td (2 - Td or Tdap) 10/13/2020   DEXA SCAN  10/31/2022   COVID-19 Vaccine (6 - 2023-24 season) 01/31/2023    Colorectal cancer screening: Type of screening: Colonoscopy. Completed 05/15/21. Repeat every 3 years  Mammogram status: Completed 12/17/22. Repeat every year  Bone Density status: Ordered 03/30/23. Pt provided with contact info and advised to call to schedule appt.    Additional Screening:  Hepatitis C Screening: does qualify; Completed 03/05/23  Vision Screening: Recommended annual ophthalmology exams for early detection of glaucoma and other disorders of the eye. Is the patient up to date with their annual eye exam?  Yes  Who is the provider or what is the name of the office in which the patient attends annual eye exams? [facility] If pt is not established with a provider, would they like to be referred to a provider to establish care? No .   Dental Screening: Recommended annual dental exams for proper oral hygiene    Community Resource Referral / Chronic Care Management:  CRR required this visit?  No    CCM required this visit?  No     Plan:     I have personally reviewed and noted the following in the patient's chart:   Medical and social history Use of alcohol, tobacco or illicit drugs  Current medications and supplements including opioid prescriptions. Patient is currently taking opioid prescriptions. Information provided to patient regarding non-opioid alternatives. Patient advised to discuss non-opioid treatment plan with their provider. Functional ability and status Nutritional status Physical activity Advanced directives List of other physicians Hospitalizations, surgeries, and ER visits in previous 12 months Vitals Screenings to include cognitive, depression, and falls Referrals and appointments  In addition, I have reviewed and discussed with patient certain preventive protocols, quality metrics, and best practice recommendations. A written personalized care plan for preventive services as well as general preventive health recommendations were provided to patient.     Tillie Rung, LPN   [id number]   After Visit Summary: (MyChart) Due to this being a telephonic visit, the after visit summary with patients personalized plan was offered to patient via MyChart   Nurse Notes: None

## 2023-04-02 ENCOUNTER — Inpatient Hospital Stay: Payer: Medicare Other | Attending: Hematology | Admitting: Hematology

## 2023-04-02 VITALS — BP 137/68 | HR 56 | Temp 97.3°F | Resp 18 | Wt 163.6 lb

## 2023-04-02 DIAGNOSIS — I10 Essential (primary) hypertension: Secondary | ICD-10-CM | POA: Diagnosis not present

## 2023-04-02 DIAGNOSIS — Z79899 Other long term (current) drug therapy: Secondary | ICD-10-CM | POA: Diagnosis not present

## 2023-04-02 DIAGNOSIS — K5900 Constipation, unspecified: Secondary | ICD-10-CM | POA: Diagnosis not present

## 2023-04-02 DIAGNOSIS — Z808 Family history of malignant neoplasm of other organs or systems: Secondary | ICD-10-CM | POA: Insufficient documentation

## 2023-04-02 DIAGNOSIS — Z8041 Family history of malignant neoplasm of ovary: Secondary | ICD-10-CM | POA: Diagnosis not present

## 2023-04-02 DIAGNOSIS — R5383 Other fatigue: Secondary | ICD-10-CM | POA: Diagnosis not present

## 2023-04-02 DIAGNOSIS — R1030 Lower abdominal pain, unspecified: Secondary | ICD-10-CM | POA: Diagnosis not present

## 2023-04-02 DIAGNOSIS — I2510 Atherosclerotic heart disease of native coronary artery without angina pectoris: Secondary | ICD-10-CM | POA: Insufficient documentation

## 2023-04-02 DIAGNOSIS — R61 Generalized hyperhidrosis: Secondary | ICD-10-CM | POA: Diagnosis not present

## 2023-04-02 DIAGNOSIS — Z833 Family history of diabetes mellitus: Secondary | ICD-10-CM | POA: Diagnosis not present

## 2023-04-02 DIAGNOSIS — Z801 Family history of malignant neoplasm of trachea, bronchus and lung: Secondary | ICD-10-CM | POA: Insufficient documentation

## 2023-04-02 DIAGNOSIS — R0602 Shortness of breath: Secondary | ICD-10-CM | POA: Insufficient documentation

## 2023-04-02 DIAGNOSIS — Z8261 Family history of arthritis: Secondary | ICD-10-CM | POA: Diagnosis not present

## 2023-04-02 DIAGNOSIS — C8204 Follicular lymphoma grade I, lymph nodes of axilla and upper limb: Secondary | ICD-10-CM | POA: Insufficient documentation

## 2023-04-02 DIAGNOSIS — I700 Atherosclerosis of aorta: Secondary | ICD-10-CM | POA: Diagnosis not present

## 2023-04-02 DIAGNOSIS — Z8249 Family history of ischemic heart disease and other diseases of the circulatory system: Secondary | ICD-10-CM | POA: Insufficient documentation

## 2023-04-02 DIAGNOSIS — Z87891 Personal history of nicotine dependence: Secondary | ICD-10-CM | POA: Insufficient documentation

## 2023-04-02 DIAGNOSIS — C8294 Follicular lymphoma, unspecified, lymph nodes of axilla and upper limb: Secondary | ICD-10-CM

## 2023-04-02 DIAGNOSIS — E785 Hyperlipidemia, unspecified: Secondary | ICD-10-CM | POA: Diagnosis not present

## 2023-04-02 DIAGNOSIS — I252 Old myocardial infarction: Secondary | ICD-10-CM | POA: Insufficient documentation

## 2023-04-02 NOTE — Progress Notes (Signed)
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   Recent mammogram did show findings of a persistent mass in the low left axilla with appearance of an increasingly prominent lymph node. Targeted US showed a left axillary lymph node with diffuse, heterogenous cortical thickening to 4.5 mm. Patient had a biopsy of the left axillary lymph node, which showed atypical lymphoid infiltrates suggestive of a lymphoproliferative disorder. Excisional biopsy did show findings of low-grade follicular lymphoma.   Today, she reports that she has been doing well overall. She denies any pain from her recent biopsy on 9/17.   Patient complains of a lack of energy, fatigue, and reduced strength. Her symptoms have worsened over the last year. Activates that have become more difficult due to fatigue include mowing the lawn and using the treadmill. The severity of her fatigue fluctuates. She notes that she generally aims to take 10,000 steps in a day, but has not been able to do so over the last couple of weeks due to lack of energy.   She complains of a gradual worsening of exertional SOB when going uphill.   Patient has endorsed night sweats over the last 7 months to 1 year. Her  night sweats are not drenching and she does feel warmth with her sweats. She denies any unexplained fever or chills.  Patient endorses mild constipation, which is a new symptom beginning a couple months ago. She notes having rectosigmoid resection surgery 3 years ago for large benign polyps.   Patient complains of pain in her groin on one side when bending down, present for 1 month.   Her osteoporosis is managed by Fosamax, which she has been taking for over two years and potentially over 5 years. She notes that there were considerations of Teriparatide previously, which she is not taking at this time. Patient notes that she has fractured her knees previously. Her last bone density study was two years ago. Patient takes a calcium supplement which has a vitamin D component. She does not generally consume dairy in her diet.   Patient denies any leg/hand swelling and denies any axillary lymphedema. She denies any abdominal pain/distention, back pain or other new lumps/bumps. Patient does note having lumbar issues. She denies any other notable new changes in the last 6-12 months. Her coronary artery disease has been stable and she denies any other recent medical issues that have been a limiting factor.   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  INTERVAL HISTORY:  Tina [name] is a 77 y.o. 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 She was last seen by me on 03/05/2023 and complained of lack of energy, fatigue, reduced strength, worsened exertional SOB, mild night sweats, mild constipation, and one-sided groin pain with bending.   Today, she reports feeling well overall since her last clinical visit.  Patient has not felt any new lumps/bumps recently and denies any leg swelling.   She continues to endorse one-sided groin pain when bending over.   Patient reports an occasion of walking two miles, and needing to stop due to feeling like her legs were  going to give out. She reports that generally would walk 10,000 steps daily but has not been able to do so in the last couple of weeks.   She reports that her brother was recently diagnosed with liver cancer.   MEDICAL HISTORY:  Past Medical History:  Diagnosis Date   Allergy    seasonal   Cataract    removed both eyes   Fatty liver    Goiter    treated   HTN (hypertension)    Hyperlipidemia    Myocardial infarct (HCC) hx of 2004   NSTEMI with stent   Osteoporosis    Patellar fracture    Sleep apnea    no cpap   Wrist fracture    right wrist    SURGICAL HISTORY: Past Surgical History:  Procedure Laterality Date   BREAST BIOPSY Left 02/15/2023   [place] LT RADIOACTIVE SEED LOC 02/15/2023 [facility]   CARDIAC CATHETERIZATION  2004   stent   CATARACT EXTRACTION, BILATERAL  05/2020   COLONOSCOPY     POLYPECTOMY     PROCTOSCOPY N/A 07/12/2019   Procedure: RIGID PROCTOSCOPY;  Surgeon: Karie Soda, MD;  Location: [facility];  Service: General;  Laterality: N/A;   RADIOACTIVE SEED GUIDED AXILLARY SENTINEL LYMPH NODE Left 02/16/2023   Procedure: RADIOACTIVE SEED GUIDED EXCISIONAL BIOPSY LEFT AXILLARY LYMPH NODE;  Surgeon: Abigail Miyamoto, MD;  Location: [facility];  Service: General;  Laterality: Left;   sleep study  2012    SOCIAL HISTORY: Social History   Socioeconomic History   Marital status: Divorced    Spouse name: Not on file   Number of children: Not on file   Years of education: Not on file   Highest education level: Not on file  Occupational History   Occupation: nurse    Employer: OTHER    Comment: [employer]   Tobacco Use   Smoking status: Former    Current packs/day: 0.00    Average packs/day: 1 pack/day for 30.0 years (30.0 ttl pk-yrs)    Types: Cigarettes    Start date: 12/12/1972    Quit date: 12/13/2002    Years since quitting: 20.3   Smokeless tobacco: Former    Quit date: 06/01/2002  Vaping Use   Vaping status: Never Used   Substance and Sexual Activity   Alcohol use: Yes    Comment: 1-2 glasses of wine with dinner   Drug use: No   Sexual activity: Not Currently    Partners: Male  Other Topics Concern   Not on file  Social History Narrative   Occupation:[employer]   Divorced   Former Smoker quit 2004   Drug use-no   Regular exercise-treadmill 3x a week   Originially from [place]   Social Determinants of Health   Financial Resource Strain: Low Risk  (03/26/2023)   Overall Financial Resource Strain (CARDIA)    Difficulty of Paying Living Expenses: Not very hard  Food Insecurity: No Food Insecurity (03/26/2023)   Hunger Vital Sign    Worried About Running Out of Food in the Last Year: Never true    Ran Out of Food in the Last Year: Never true  Transportation Needs: No Transportation Needs (03/26/2023)   PRAPARE -  Administrator, Civil Service (Medical): No    Lack of Transportation (Non-Medical): No  Physical Activity: Sufficiently Active (03/26/2023)   Exercise Vital Sign    Days of Exercise per Week: 5 days    Minutes of Exercise per Session: 40 min  Stress: No Stress Concern Present (03/26/2023)   [employer] of Occupational Health - Occupational Stress Questionnaire    Feeling of Stress : Not at all  Social Connections: Unknown (03/26/2023)   Social Connection and Isolation Panel [NHANES]    Frequency of Communication with Friends and Family: More than three times a week    Frequency of Social Gatherings with Friends and Family: Once a week    Attends Religious Services: Not on Insurance claims handler of Clubs or Organizations: No    Attends Banker Meetings: Patient declined    Marital Status: Divorced  Catering manager Violence: Not At Risk (03/30/2023)   Humiliation, Afraid, Rape, and Kick questionnaire    Fear of Current or Ex-Partner: No    Emotionally Abused: No    Physically Abused: No    Sexually Abused: No    FAMILY HISTORY: Family  History  Problem Relation Age of Onset   Hypertension Mother    Heart disease Mother 77       MI--- died from #3 at 64   Ovarian cancer Mother    Hypertension Father    Cancer Father 42       ovarian   Heart disease Father    Hypertension Sister    Cancer Sister 72       brain tumor   Coronary artery disease Other    Lung cancer Other    Ovarian cancer Other    Diabetes Maternal Grandmother    Hypertension Brother    Arthritis Brother    Colon cancer Neg Hx    Colon polyps Neg Hx    Esophageal cancer Neg Hx    Stomach cancer Neg Hx    Rectal cancer Neg Hx     ALLERGIES:  has No Known Allergies.  MEDICATIONS:  Current Outpatient Medications  Medication Sig Dispense Refill   alendronate (FOSAMAX) 70 MG tablet TAKE 1 TABLET BY MOUTH 1 TIME WEEKLY ON AN EMPTY STOMACH AND WITH FULL GLASS OF WATER 12 tablet 1   aspirin 81 MG tablet Take 81 mg by mouth daily.     Calcium Carbonate-Vit D-Min (CALCIUM 1200 PO) Take 1,200 mg by mouth daily. 1 tab po qd     Multiple Vitamin (MULTIVITAMIN) capsule Take 1 capsule by mouth daily.     Omega-3 Fatty Acids (FISH OIL) 1000 MG CAPS Take 2,000 mg by mouth daily.     ramipril (ALTACE) 5 MG capsule TAKE 1 CAPSULE(5 MG) BY MOUTH DAILY 90 capsule 1   rosuvastatin (CRESTOR) 20 MG tablet TAKE 1 TABLET(20 MG) BY MOUTH DAILY 90 tablet 3   No current facility-administered medications for this visit.    REVIEW OF SYSTEMS:    10 Point review of Systems was done is negative except as noted above.   PHYSICAL EXAMINATION: ECOG PERFORMANCE STATUS: 1 - Symptomatic but completely ambulatory  . Vitals:   04/02/23 1224  BP: 137/68  Pulse: (!) 56  Resp: 18  Temp: (!) 97.3 F (36.3 C)  SpO2: 99%    Filed Weights   04/02/23 1224  Weight: 163 lb 9.6 oz (74.2 kg)   .Body mass index is 27.22 kg/m.    GENERAL:alert, in  no acute distress and comfortable SKIN: no acute rashes, no significant lesions EYES: conjunctiva are pink and non-injected,  sclera anicteric OROPHARYNX: MMM, no exudates, no oropharyngeal erythema or ulceration NECK: supple, no JVD LYMPH:  no palpable lymphadenopathy in the cervical, axillary or inguinal regions LUNGS: clear to auscultation b/l with normal respiratory effort HEART: regular rate & rhythm ABDOMEN:  normoactive bowel sounds , non tender, not distended. Extremity: no pedal edema PSYCH: alert & oriented x 3 with fluent speech NEURO: no focal motor/sensory deficits   LABORATORY DATA:  I have reviewed the data as listed  .    Latest Ref Rng & Units 03/05/2023    3:46 PM 06/05/2022    2:03 PM 06/05/2021   10:15 AM  CBC  WBC 4.0 - 10.5 K/uL 4.4  5.5  6.1   Hemoglobin 12.0 - 15.0 g/dL 04.5  40.9  81.1   Hematocrit 36.0 - 46.0 % 37.9  38.9  38.9   Platelets 150 - 400 K/uL 227  229  278.0     .    Latest Ref Rng & Units 03/05/2023    3:46 PM 06/05/2022    2:03 PM 12/15/2021   11:10 AM  CMP  Glucose 70 - 99 mg/dL 88  85  84   BUN 8 - 23 mg/dL 11  18  12    Creatinine 0.44 - 1.00 mg/dL 9.14  7.82  9.56   Sodium 135 - 145 mmol/L 135  139  137   Potassium 3.5 - 5.1 mmol/L 4.3  4.6  4.3   Chloride 98 - 111 mmol/L 99  99  98   CO2 22 - 32 mmol/L 30  27  30    Calcium 8.9 - 10.3 mg/dL 21.3  08.6  9.6   Total Protein 6.5 - 8.1 g/dL 7.2  7.0  6.7   Total Bilirubin 0.3 - 1.2 mg/dL 0.7  0.6  0.8   Alkaline Phos 38 - 126 U/L 59   33   AST 15 - 41 U/L 20  19  24    ALT 0 - 44 U/L 20  12  13      02/16/2023 LYMPH NODE, LEFT AXILLARY, EXCISION:     RADIOGRAPHIC STUDIES: I have personally reviewed the radiological images as listed and agreed with the findings in the report. NM PET Image Initial (PI) Skull Base To Thigh  Result Date: 04/02/2023 CLINICAL DATA:  Initial treatment strategy for newly diagnosed follicular lymphoma. EXAM: NUCLEAR MEDICINE PET SKULL BASE TO THIGH TECHNIQUE: 8.07 mCi F-18 FDG was injected intravenously. Full-ring PET imaging was performed from the skull base to thigh after the  radiotracer. CT data was obtained and used for attenuation correction and anatomic localization. Fasting blood glucose: 105 mg/dl COMPARISON:  CT of the chest, abdomen and pelvis 04/17/2019 FINDINGS: Mediastinal blood pool activity: SUV max 2.7 Liver activity: SUV max 3.4 NECK: No hypermetabolic cervical lymph nodes are identified. No suspicious activity identified within the pharyngeal mucosal space. Incidental CT findings: none CHEST: There are small mildly hypermetabolic axillary lymph nodes bilaterally. Representative nodes include a 1.2 cm short axis left axillary node on image 57/4 (SUV max 4.4) and a 0.8 cm right axillary node on image 51/4 (SUV max 4.2). He has there is a mildly hypermetabolic right retrocrural node measuring 1.0 cm on image 94/4 (SUV max 4.9). No other hypermetabolic mediastinal or hilar lymph nodes. No hypermetabolic pulmonary activity or suspicious nodularity. Incidental CT findings: Atherosclerosis of the aorta, great vessels and coronary arteries.  ABDOMEN/PELVIS: There is no hypermetabolic activity within the liver, adrenal glands, spleen or pancreas. No hypermetabolic lymph nodes within the abdomen or pelvis. There are small hypermetabolic inguinal lymph nodes bilaterally including an 8 mm right inguinal node on image 186.4 (SUV max 4.4). Prominent lymph nodes are again noted within the base of the mesentery and retroperitoneum with surrounding soft tissue stranding, mildly increased from previous CT of 2020. However, these lymph nodes do not show significant hypermetabolic activity. Incidental CT findings: Aortic and branch vessel atherosclerosis. Moderate stool throughout the colon. SKELETON: There is mild hypermetabolic activity posteriorly in 2 adjacent left-sided ribs (SUV max 3.6 in the left 7th rib). There is also mild hypermetabolic activity within the right T9 articulating facet (SUV max 4.1). These osseous lesions are nonspecific. No highly suspicious osseous findings. There  is hypermetabolic activity within the left breast at the site of a biopsy clip (SUV max 2.6). Incidental CT findings: Mild spondylosis. Typical hemangioma noted in the T6 vertebral body. There is some arthropathic activity at the left shoulder IMPRESSION: 1. Small mildly hypermetabolic axillary, retrocrural and inguinal lymph nodes bilaterally, consistent with known lymphoma. 2. No other hypermetabolic lymph nodes identified. 3. Prominent lymph nodes within the base of the mesentery and retroperitoneum with surrounding soft tissue stranding, mildly increased from previous CT of 2020. However, these lymph nodes do not show significant hypermetabolic activity. 4. Mild hypermetabolic activity within the recently biopsied left breast lesion. 5. Mild hypermetabolic activity in 2 adjacent left-sided ribs and the right T9 articulating facet, nonspecific. 6. aortic Atherosclerosis (ICD10-I70.0). Electronically Signed   By: Carey Bullocks M.D.   On: 04/02/2023 13:24    ASSESSMENT & PLAN:  77 y.o. female with:  Follicular low-grade B-cell lymphoma   PLAN:  -Discussed lab results from 03/05/2023 in detail with patient. CBC normal, showed WBC of 4.4K, hemoglobin of 13.1, and platelets of 227K. -lymphoma is not affecting blood counts at this time -CMP normal -LDH WNL -Awaiting official reading of 03/19/2023 PET scan from radiologist at time of clinical visit -reviewed PET scan images with patient. Findings appear to be consistent with at least stage 3 low-grade disease, and there is no immediate significant concern at this time. There does appear to be lymph nodes in the neck, right axillary, groin, and abdomen. Unlikely that lymph nodes are causing any symptoms at this time. No obvious findings of bulky disease. Not seeing a very enlarged or active spleen. Liver does not appear enlarged and she does not appear to have any spots in the liver. Her lungs and heart appear normal.  -Based on her stable blood counts,  no overt new symptoms, and scan findings, it would likely be reasonable to monitor disease unless she develops any new symptoms. Will await official PET scan reading for further analysis.  -recommend patient to stay UTD with age-appropriate vaccines, including flu and RSV, as lymphoma can increase the risk of infections. Patient reports that she has received the Flu and RSV vaccine and is planning on receiving the shingles vaccine.  -discussed that if there was only one area of disease, treatment would typically involve radiatation therapy. Discussed that low grade lymphomas typically are not localized and do not spread from one lymph node to another -will plan for CT scan in 1 year. If findings are stable in 1 year, there may be a role to extend visits.  -will continue to monitor with labs in 6 months -answered all of patient's questions in detail  -I reconnected with patient  later that day to discuss official PET scan reading. Discussed findings, including small lymph nodes in bilateral axillaries, which were not very active; some borderline lenlarged lymph nodes in the mesentary, which were inactive; and some enlarged borderline lymph nodes in the groin. There were no findings of bulky disease or findings likely to immediately cause sypmtoms at this time.   FOLLOW-UP: RTC with Dr Candise Che with labs in 6 months  The total time spent in the appointment was 30 minutes* .  All of the patient's questions were answered with apparent satisfaction. The patient knows to call the clinic with any problems, questions or concerns.   Wyvonnia Lora MD MS AAHIVMS [facility] Hematology/Oncology Physician [facility]  .*Total Encounter Time as defined by the Centers for Medicare and Medicaid Services includes, in addition to the face-to-face time of a patient visit (documented in the note above) non-face-to-face time: obtaining and reviewing outside history, ordering and reviewing medications, tests or  procedures, care coordination (communications with other health care professionals or caregivers) and documentation in the medical record.    I,Mitra Faeizi,acting as a Neurosurgeon for Wyvonnia Lora, MD.,have documented all relevant documentation on the behalf of Wyvonnia Lora, MD,as directed by  Wyvonnia Lora, MD while in the presence of Wyvonnia Lora, MD.  .I have reviewed the above documentation for accuracy and completeness, and I agree with the above. Johney Maine MD

## 2023-04-05 ENCOUNTER — Telehealth: Payer: Self-pay | Admitting: Hematology

## 2023-04-05 NOTE — Telephone Encounter (Signed)
Per Candise Che 04/02/2023 scheduling orders patient is aware of scheduled appointment times/dates

## 2023-05-23 ENCOUNTER — Other Ambulatory Visit: Payer: Self-pay | Admitting: Family Medicine

## 2023-05-23 DIAGNOSIS — I10 Essential (primary) hypertension: Secondary | ICD-10-CM

## 2023-08-06 ENCOUNTER — Other Ambulatory Visit: Payer: Self-pay | Admitting: Family Medicine

## 2023-08-10 ENCOUNTER — Ambulatory Visit (INDEPENDENT_AMBULATORY_CARE_PROVIDER_SITE_OTHER): Admitting: Family Medicine

## 2023-08-10 ENCOUNTER — Telehealth: Payer: Self-pay | Admitting: *Deleted

## 2023-08-10 ENCOUNTER — Encounter: Payer: Self-pay | Admitting: Family Medicine

## 2023-08-10 VITALS — BP 120/84 | HR 76 | Temp 98.5°F | Resp 16 | Ht 65.0 in | Wt 171.8 lb

## 2023-08-10 DIAGNOSIS — R5383 Other fatigue: Secondary | ICD-10-CM | POA: Diagnosis not present

## 2023-08-10 DIAGNOSIS — E782 Mixed hyperlipidemia: Secondary | ICD-10-CM

## 2023-08-10 DIAGNOSIS — I10 Essential (primary) hypertension: Secondary | ICD-10-CM

## 2023-08-10 DIAGNOSIS — M810 Age-related osteoporosis without current pathological fracture: Secondary | ICD-10-CM

## 2023-08-10 DIAGNOSIS — Z13820 Encounter for screening for osteoporosis: Secondary | ICD-10-CM | POA: Diagnosis not present

## 2023-08-10 LAB — TSH: TSH: 1.69 u[IU]/mL (ref 0.35–5.50)

## 2023-08-10 LAB — LIPID PANEL
Cholesterol: 178 mg/dL (ref 0–200)
HDL: 88.5 mg/dL (ref >39.00)
LDL Cholesterol: 77 mg/dL (ref 0–99)
NonHDL: 89.36
Total CHOL/HDL Ratio: 2
Triglycerides: 62 mg/dL (ref 0.0–149.0)
VLDL: 12.4 mg/dL (ref 0.0–40.0)

## 2023-08-10 LAB — COMPREHENSIVE METABOLIC PANEL
ALT: 13 U/L (ref 0–35)
AST: 20 U/L (ref 0–37)
Albumin: 4.7 g/dL (ref 3.5–5.2)
Alkaline Phosphatase: 38 U/L — ABNORMAL LOW (ref 39–117)
BUN: 18 mg/dL (ref 6–23)
CO2: 31 meq/L (ref 19–32)
Calcium: 10.5 mg/dL (ref 8.4–10.5)
Chloride: 101 meq/L (ref 96–112)
Creatinine, Ser: 0.74 mg/dL (ref 0.40–1.20)
GFR: 78.58 mL/min (ref >60.00)
Glucose, Bld: 95 mg/dL (ref 70–99)
Potassium: 4.5 meq/L (ref 3.5–5.1)
Sodium: 140 meq/L (ref 135–145)
Total Bilirubin: 0.5 mg/dL (ref 0.2–1.2)
Total Protein: 6.9 g/dL (ref 6.0–8.3)

## 2023-08-10 LAB — CBC WITH DIFFERENTIAL/PLATELET
Basophils Absolute: 0 10*3/uL (ref 0.0–0.1)
Basophils Relative: 0.7 % (ref 0.0–3.0)
Eosinophils Absolute: 0.1 10*3/uL (ref 0.0–0.7)
Eosinophils Relative: 1.3 % (ref 0.0–5.0)
HCT: 40.8 % (ref 36.0–46.0)
Hemoglobin: 13.9 g/dL (ref 12.0–15.0)
Lymphocytes Relative: 29.9 % (ref 12.0–46.0)
Lymphs Abs: 1.6 10*3/uL (ref 0.7–4.0)
MCHC: 34 g/dL (ref 30.0–36.0)
MCV: 93.5 fl (ref 78.0–100.0)
Monocytes Absolute: 0.5 10*3/uL (ref 0.1–1.0)
Monocytes Relative: 8.3 % (ref 3.0–12.0)
Neutro Abs: 3.3 10*3/uL (ref 1.4–7.7)
Neutrophils Relative %: 59.8 % (ref 43.0–77.0)
Platelets: 256 10*3/uL (ref 150.0–400.0)
RBC: 4.36 Mil/uL (ref 3.87–5.11)
RDW: 13.1 % (ref 11.5–15.5)
WBC: 5.5 10*3/uL (ref 4.0–10.5)

## 2023-08-10 LAB — VITAMIN D 25 HYDROXY (VIT D DEFICIENCY, FRACTURES): VITD: 37.57 ng/mL (ref 30.00–100.00)

## 2023-08-10 LAB — VITAMIN B12: Vitamin B-12: 442 pg/mL (ref 211–911)

## 2023-08-10 MED ORDER — DENOSUMAB 60 MG/ML ~~LOC~~ SOSY: 60.0000 mg | PREFILLED_SYRINGE | Freq: Once | SUBCUTANEOUS | Status: AC

## 2023-08-10 NOTE — Telephone Encounter (Signed)
-----   Message from Lelon Perla Chase sent at 08/10/2023  1:26 PM EDT ----- Pt states evenity was too expensive-$400 / m--- would like to know how much prolia is  Bone density to be done in May

## 2023-08-10 NOTE — Progress Notes (Unsigned)
 Established Patient Office Visit  Subjective   Patient ID: Tina [name], female    DOB: 1946-01-17  Age: 78 y.o. MRN: [id number]  Chief Complaint  Patient presents with   Fatigue    Pt states having fatigue and weakness and discuss other Proila    HPI Discussed the use of AI scribe software for clinical note transcription with the patient, who gave verbal consent to proceed.  History of Present Illness   The patient, with osteoporosis and lymphoma, presents with concerns regarding osteoporosis management and fatigue.  She is concerned about her osteoporosis management, having been on Fosamax for a long duration due to progression from osteopenia to osteoporosis. Her bones 'creak', and she is considering alternative treatments like Prolia and Evenity, but is concerned about the cost.  She experiences significant fatigue and lack of energy, which has worsened over the past year. Previously active, engaging in activities like walking 10,000 steps a day and using a treadmill, she now struggles with weakness, particularly in her muscles, and has difficulty performing tasks like opening jars. She does not engage in weight training or swimming and has a history of knee injuries.  She was diagnosed with lymphoma in October, which may contribute to her fatigue. A biopsy of an axillary node confirmed the diagnosis, and a PET scan showed no other areas of concern.  Her diet is low in protein, typically consisting of toast and prunes for breakfast, with occasional bacon. She consumes cheese and crackers and sometimes chicken or fish. She has started using protein shakes to supplement her diet. She takes a multivitamin and B12 supplements. Her B12 levels were previously low normal, but recent tests have been normal.      Patient Active Problem List   Diagnosis Date Noted   Spinal stenosis of lumbar region 06/19/2021   Low back pain with radiation 06/05/2021   Right leg weakness 06/05/2021    Primary hypertension 06/05/2021   Pain in left shin 01/01/2020   Adenomatous polyp s/p LAR rectosigmoid resection 07/12/2019 05/15/2019   Osteoporosis 04/06/2019   Sinus bradycardia 02/24/2017   Post-menopausal bleeding 10/28/2016   Left shoulder pain 01/22/2016   Right wrist injury 03/18/2015   Obesity (BMI 30-39.9) 05/29/2013   Otitis externa 09/30/2010   CONJUNCTIVITIS, BACTERIAL 12/16/2009   SINUSITIS- ACUTE-NOS 08/01/2009   TOBACCO ABUSE, HX OF 07/22/2009   OTITIS EXTERNA, ACUTE, RIGHT 02/08/2009   Goiter 02/10/2007   Hyperlipidemia 02/10/2007   Essential hypertension 02/10/2007   MYOCARDIAL INFARCTION, HX OF 02/10/2007   DIZZINESS 02/10/2007   EDEMA LEG 02/10/2007   FATTY LIVER DISEASE, HX OF 02/10/2007   Past Medical History:  Diagnosis Date   Allergy    seasonal   Cataract    removed both eyes   Fatty liver    Goiter    treated   HTN (hypertension)    Hyperlipidemia    Myocardial infarct (HCC) hx of 2004   NSTEMI with stent   Osteoporosis    Patellar fracture    Sleep apnea    no cpap   Wrist fracture    right wrist   Past Surgical History:  Procedure Laterality Date   BREAST BIOPSY Left 02/15/2023   [place] LT RADIOACTIVE SEED LOC 02/15/2023 [facility]   CARDIAC CATHETERIZATION  2004   stent   CATARACT EXTRACTION, BILATERAL  05/2020   COLONOSCOPY     POLYPECTOMY     PROCTOSCOPY N/A 07/12/2019   Procedure: RIGID PROCTOSCOPY;  Surgeon: Karie Soda, MD;  Location: [facility];  Service: General;  Laterality: N/A;   RADIOACTIVE SEED GUIDED AXILLARY SENTINEL LYMPH NODE Left 02/16/2023   Procedure: RADIOACTIVE SEED GUIDED EXCISIONAL BIOPSY LEFT AXILLARY LYMPH NODE;  Surgeon: Abigail Miyamoto, MD;  Location: [facility];  Service: General;  Laterality: Left;   sleep study  2012   Social History   Tobacco Use   Smoking status: Former    Current packs/day: 0.00    Average packs/day: 1 pack/day for 30.0 years (30.0 ttl pk-yrs)    Types:  Cigarettes    Start date: 12/12/1972    Quit date: 12/13/2002    Years since quitting: 20.6   Smokeless tobacco: Former    Quit date: 06/01/2002  Vaping Use   Vaping status: Never Used  Substance Use Topics   Alcohol use: Yes    Comment: 1-2 glasses of wine with dinner   Drug use: No   Social History   Socioeconomic History   Marital status: Divorced    Spouse name: Not on file   Number of children: Not on file   Years of education: Not on file   Highest education level: Associate degree: occupational, Scientist, product/process development, or vocational program  Occupational History   Occupation: Academic librarian: OTHER    Comment: [employer]   Tobacco Use   Smoking status: Former    Current packs/day: 0.00    Average packs/day: 1 pack/day for 30.0 years (30.0 ttl pk-yrs)    Types: Cigarettes    Start date: 12/12/1972    Quit date: 12/13/2002    Years since quitting: 20.6   Smokeless tobacco: Former    Quit date: 06/01/2002  Vaping Use   Vaping status: Never Used  Substance and Sexual Activity   Alcohol use: Yes    Comment: 1-2 glasses of wine with dinner   Drug use: No   Sexual activity: Not Currently    Partners: Male  Other Topics Concern   Not on file  Social History Narrative   Occupation:[employer]   Divorced   Former Smoker quit 2004   Drug use-no   Regular exercise-treadmill 3x a week   Originially from [place]   Social Drivers of [employer] Strain: Low Risk  (08/09/2023)   Overall Financial Resource Strain (CARDIA)    Difficulty of Paying Living Expenses: Not very hard  Food Insecurity: No Food Insecurity (08/09/2023)   Hunger Vital Sign    Worried About Running Out of Food in the Last Year: Never true    Ran Out of Food in the Last Year: Never true  Transportation Needs: No Transportation Needs (08/09/2023)   PRAPARE - Administrator, Civil Service (Medical): No    Lack of Transportation (Non-Medical): No  Physical Activity: Unknown  (08/09/2023)   Exercise Vital Sign    Days of Exercise per Week: Patient declined    Minutes of Exercise per Session: 40 min  Stress: Patient Declined (08/09/2023)   [employer] of Occupational Health - Occupational Stress Questionnaire    Feeling of Stress : Patient declined  Social Connections: Socially Isolated (08/09/2023)   Social Connection and Isolation Panel [NHANES]    Frequency of Communication with Friends and Family: More than three times a week    Frequency of Social Gatherings with Friends and Family: Once a week    Attends Religious Services: Never    Database administrator or Organizations: No    Attends Banker Meetings: Patient  declined    Marital Status: Divorced  Catering manager Violence: Not At Risk (03/30/2023)   Humiliation, Afraid, Rape, and Kick questionnaire    Fear of Current or Ex-Partner: No    Emotionally Abused: No    Physically Abused: No    Sexually Abused: No   Family Status  Relation Name Status   Mother  Deceased   Father  Deceased   Sister  Alive   Sister  Deceased   Other  (Not Specified)   Other  (Not Specified)   Other  (Not Specified)   MGM  (Not Specified)   Brother  (Not Specified)   Neg Hx  (Not Specified)  No partnership data on file   Family History  Problem Relation Age of Onset   Hypertension Mother    Heart disease Mother 54       MI--- died from #3 at 77   Ovarian cancer Mother    Hypertension Father    Cancer Father 68       ovarian   Heart disease Father    Hypertension Sister    Cancer Sister 51       brain tumor   Coronary artery disease Other    Lung cancer Other    Ovarian cancer Other    Diabetes Maternal Grandmother    Hypertension Brother    Arthritis Brother    Colon cancer Neg Hx    Colon polyps Neg Hx    Esophageal cancer Neg Hx    Stomach cancer Neg Hx    Rectal cancer Neg Hx    No Known Allergies    Review of Systems  Constitutional:  Negative for fever and  malaise/fatigue.  HENT:  Negative for congestion.   Eyes:  Negative for blurred vision.  Respiratory:  Negative for cough and shortness of breath.   Cardiovascular:  Negative for chest pain, palpitations and leg swelling.  Gastrointestinal:  Negative for abdominal pain, blood in stool, nausea and vomiting.  Genitourinary:  Negative for dysuria and frequency.  Musculoskeletal:  Negative for back pain and falls.  Skin:  Negative for rash.  Neurological:  Negative for dizziness, loss of consciousness and headaches.  Endo/Heme/Allergies:  Negative for environmental allergies.  Psychiatric/Behavioral:  Negative for depression. The patient is not nervous/anxious.       Objective:     BP 120/84 (BP Location: Left Arm, Patient Position: Sitting, Cuff Size: Large)   Pulse 76   Temp 98.5 F (36.9 C) (Oral)   Resp 16   Ht 5\' 5"  (1.651 m)   Wt 171 lb 12.8 oz (77.9 kg)   SpO2 96%   BMI 28.59 kg/m  BP Readings from Last 3 Encounters:  08/10/23 120/84  04/02/23 137/68  03/05/23 (!) 180/73   Wt Readings from Last 3 Encounters:  08/10/23 171 lb 12.8 oz (77.9 kg)  04/02/23 163 lb 9.6 oz (74.2 kg)  03/30/23 162 lb (73.5 kg)   SpO2 Readings from Last 3 Encounters:  08/10/23 96%  04/02/23 99%  03/05/23 99%      Physical Exam Vitals and nursing note reviewed.  Constitutional:      General: She is not in acute distress.    Appearance: Normal appearance. She is well-developed.  HENT:     Head: Normocephalic and atraumatic.     Right Ear: Tympanic membrane, ear canal and external ear normal. There is no impacted cerumen.     Left Ear: Tympanic membrane, ear canal and external ear normal. There is  no impacted cerumen.     Nose: Nose normal.     Mouth/Throat:     Mouth: Mucous membranes are moist.     Pharynx: Oropharynx is clear. No oropharyngeal exudate or posterior oropharyngeal erythema.  Eyes:     General: No scleral icterus.       Right eye: No discharge.        Left eye: No  discharge.     Conjunctiva/sclera: Conjunctivae normal.     Pupils: Pupils are equal, round, and reactive to light.  Neck:     Thyroid: No thyromegaly or thyroid tenderness.     Vascular: No JVD.  Cardiovascular:     Rate and Rhythm: Normal rate and regular rhythm.     Heart sounds: Normal heart sounds. No murmur heard. Pulmonary:     Effort: Pulmonary effort is normal. No respiratory distress.     Breath sounds: Normal breath sounds.  Abdominal:     General: Bowel sounds are normal. There is no distension.     Palpations: Abdomen is soft. There is no mass.     Tenderness: There is no abdominal tenderness. There is no guarding or rebound.  Genitourinary:    Vagina: Normal.  Musculoskeletal:        General: Normal range of motion.     Cervical back: Normal range of motion and neck supple.     Right lower leg: No edema.     Left lower leg: No edema.  Lymphadenopathy:     Cervical: No cervical adenopathy.  Skin:    General: Skin is warm and dry.     Findings: No erythema or rash.  Neurological:     Mental Status: She is alert and oriented to person, place, and time.     Cranial Nerves: No cranial nerve deficit.     Deep Tendon Reflexes: Reflexes are normal and symmetric.  Psychiatric:        Mood and Affect: Mood normal.        Behavior: Behavior normal.        Thought Content: Thought content normal.        Judgment: Judgment normal.      Results for orders placed or performed in visit on 08/10/23  CBC with Differential/Platelet  Result Value Ref Range   WBC 5.5 4.0 - 10.5 K/uL   RBC 4.36 3.87 - 5.11 Mil/uL   Hemoglobin 13.9 12.0 - 15.0 g/dL   HCT 16.1 09.6 - 04.5 %   MCV 93.5 78.0 - 100.0 fl   MCHC 34.0 30.0 - 36.0 g/dL   RDW 40.9 81.1 - 91.4 %   Platelets 256.0 150.0 - 400.0 K/uL   Neutrophils Relative % 59.8 43.0 - 77.0 %   Lymphocytes Relative 29.9 12.0 - 46.0 %   Monocytes Relative 8.3 3.0 - 12.0 %   Eosinophils Relative 1.3 0.0 - 5.0 %   Basophils Relative  0.7 0.0 - 3.0 %   Neutro Abs 3.3 1.4 - 7.7 K/uL   Lymphs Abs 1.6 0.7 - 4.0 K/uL   Monocytes Absolute 0.5 0.1 - 1.0 K/uL   Eosinophils Absolute 0.1 0.0 - 0.7 K/uL   Basophils Absolute 0.0 0.0 - 0.1 K/uL  Comprehensive metabolic panel  Result Value Ref Range   Sodium 140 135 - 145 mEq/L   Potassium 4.5 3.5 - 5.1 mEq/L   Chloride 101 96 - 112 mEq/L   CO2 31 19 - 32 mEq/L   Glucose, Bld 95 70 - 99 mg/dL  BUN 18 6 - 23 mg/dL   Creatinine, Ser 8.29 0.40 - 1.20 mg/dL   Total Bilirubin 0.5 0.2 - 1.2 mg/dL   Alkaline Phosphatase 38 (L) 39 - 117 U/L   AST 20 0 - 37 U/L   ALT 13 0 - 35 U/L   Total Protein 6.9 6.0 - 8.3 g/dL   Albumin 4.7 3.5 - 5.2 g/dL   GFR 56.21 >30.86 mL/min   Calcium 10.5 8.4 - 10.5 mg/dL  Lipid panel  Result Value Ref Range   Cholesterol 178 0 - 200 mg/dL   Triglycerides 57.8 0.0 - 149.0 mg/dL   HDL 46.96 >29.52 mg/dL   VLDL 84.1 0.0 - 32.4 mg/dL   LDL Cholesterol 77 0 - 99 mg/dL   Total CHOL/HDL Ratio 2    NonHDL 89.36   TSH  Result Value Ref Range   TSH 1.69 0.35 - 5.50 uIU/mL  Vitamin B12  Result Value Ref Range   Vitamin B-12 442 211 - 911 pg/mL  VITAMIN D 25 Hydroxy (Vit-D Deficiency, Fractures)  Result Value Ref Range   VITD 37.57 30.00 - 100.00 ng/mL    Last CBC Lab Results  Component Value Date   WBC 5.5 08/10/2023   HGB 13.9 08/10/2023   HCT 40.8 08/10/2023   MCV 93.5 08/10/2023   MCH 31.6 03/05/2023   RDW 13.1 08/10/2023   PLT 256.0 08/10/2023   Last metabolic panel Lab Results  Component Value Date   GLUCOSE 95 08/10/2023   NA 140 08/10/2023   K 4.5 08/10/2023   CL 101 08/10/2023   CO2 31 08/10/2023   BUN 18 08/10/2023   CREATININE 0.74 08/10/2023   GFR 78.58 08/10/2023   CALCIUM 10.5 08/10/2023   PHOS 3.8 12/15/2021   PROT 6.9 08/10/2023   ALBUMIN 4.7 08/10/2023   LABGLOB 1.7 07/09/2020   AGRATIO 2.8 (H) 07/09/2020   BILITOT 0.5 08/10/2023   ALKPHOS 38 (L) 08/10/2023   AST 20 08/10/2023   ALT 13 08/10/2023   ANIONGAP 6  03/05/2023   Last lipids Lab Results  Component Value Date   CHOL 178 08/10/2023   HDL 88.50 08/10/2023   LDLCALC 77 08/10/2023   LDLDIRECT 123.7 05/30/2013   TRIG 62.0 08/10/2023   CHOLHDL 2 08/10/2023   Last hemoglobin A1c Lab Results  Component Value Date   HGBA1C 5.4 07/10/2019   Last thyroid functions Lab Results  Component Value Date   TSH 1.69 08/10/2023   Last vitamin D Lab Results  Component Value Date   VD25OH 37.57 08/10/2023   Last vitamin B12 and Folate Lab Results  Component Value Date   VITAMINB12 442 08/10/2023   FOLATE 19.7 02/10/2007      The ASCVD Risk score (Arnett DK, et al., 2019) failed to calculate for the following reasons:   Risk score cannot be calculated because patient has a medical history suggesting prior/existing ASCVD    Assessment & Plan:   Problem List Items Addressed This Visit       Unprioritized   Essential hypertension   Relevant Orders   CBC with Differential/Platelet (Completed)   Comprehensive metabolic panel (Completed)   Lipid panel (Completed)   TSH (Completed)   Vitamin B12 (Completed)   VITAMIN D 25 Hydroxy (Vit-D Deficiency, Fractures) (Completed)   Primary hypertension   Well controlled, no changes to meds. Encouraged heart healthy diet such as the DASH diet and exercise as tolerated.        Hyperlipidemia   Encourage heart healthy diet  such as MIND or DASH diet, increase exercise, avoid trans fats, simple carbohydrates and processed foods, consider a krill or fish or flaxseed oil cap daily.        Relevant Orders   Comprehensive metabolic panel (Completed)   Lipid panel (Completed)   Other Visit Diagnoses       Osteoporosis screening    -  Primary   Relevant Orders   Comprehensive metabolic panel (Completed)   VITAMIN D 25 Hydroxy (Vit-D Deficiency, Fractures) (Completed)     Other fatigue       Relevant Orders   CBC with Differential/Platelet (Completed)   Comprehensive metabolic panel  (Completed)   Lipid panel (Completed)   TSH (Completed)   Vitamin B12 (Completed)   VITAMIN D 25 Hydroxy (Vit-D Deficiency, Fractures) (Completed)     Assessment and Plan    Lymphoma   Diagnosed with lymphoma in October, she experiences fatigue and weakness potentially related to the condition. Previous blood work was normal, and a PET scan showed no additional involvement. Order blood work to assess for anemia and other changes. Follow up with the oncologist as scheduled.  Fatigue and Weakness   She has significant fatigue and muscle weakness over the past year, affecting daily activities. Suspects inadequate protein intake. Increase protein intake to at least 80 grams per day and consider protein shakes as a supplement. Order a B12 level test and consider B12 injections if levels are low.  Osteoporosis   Osteopenia has progressed to osteoporosis. Currently on Fosamax, she is concerned about long-term effects. Considering Prolia or Forteo due to cost and efficacy. Prolia is less expensive and administered biannually, while Forteo requires daily injections for a year but may have better insurance coverage. Prolia prevents further deterioration, Forteo builds bone. Continue Fosamax until the bone density test in June. Check insurance coverage for Forteo and consider Prolia after the bone density test if cost-effective. Order a bone density test in June.  General Health Maintenance   She received flu and RSV vaccines and needs a shingles vaccination update. Declines further COVID vaccination. Pneumonia vaccination is current. Recommend shingles vaccination with the newer two-dose series.  Follow-up   Scheduled for a bone density test in June. Perform blood work today to assess changes since October.        No follow-ups on file.    Donato Schultz, DO

## 2023-08-10 NOTE — Telephone Encounter (Signed)
CAM order placed.

## 2023-08-13 NOTE — Assessment & Plan Note (Signed)
 Encourage heart healthy diet such as MIND or DASH diet, increase exercise, avoid trans fats, simple carbohydrates and processed foods, consider a krill or fish or flaxseed oil cap daily.

## 2023-08-13 NOTE — Assessment & Plan Note (Signed)
 Well controlled, no changes to meds. Encouraged heart healthy diet such as the DASH diet and exercise as tolerated.

## 2023-08-18 ENCOUNTER — Encounter: Payer: Self-pay | Admitting: Family Medicine

## 2023-09-02 ENCOUNTER — Other Ambulatory Visit: Payer: Self-pay | Admitting: Family Medicine

## 2023-09-03 ENCOUNTER — Telehealth: Payer: Self-pay

## 2023-09-03 NOTE — Telephone Encounter (Signed)
 Prolia VOB initiated via [email or web address]

## 2023-09-06 ENCOUNTER — Other Ambulatory Visit (HOSPITAL_COMMUNITY): Payer: Self-pay

## 2023-09-06 NOTE — Telephone Encounter (Signed)
 Marland Kitchen

## 2023-09-06 NOTE — Telephone Encounter (Signed)
 Prior Authorization form/request asks a question that requires your assistance. Please see the question below and advise accordingly. The PA will not be submitted until the necessary information is received.                     Insurance requires failure or contraindication to BOTH oral and IV bisphosphate. Only see failure to oral bisphosphate. Please advise.   Patient can use pharmacy benefit. Patient's copay is $720 (to pay at pharmacy).

## 2023-09-08 NOTE — Telephone Encounter (Signed)
 Prior Authorization form/request asks a question that requires your assistance. Please see the question below and advise accordingly. The PA will not be submitted until the necessary information is received.                     Insurance requires failure or contraindication to BOTH oral and IV bisphosphate. Only see failure to oral bisphosphate. Please advise.   Patient can use pharmacy benefit. Patient's copay is $720 (to pay at pharmacy).

## 2023-09-08 NOTE — Telephone Encounter (Signed)
 It will be $720 with pharmacy benefits for Prolia.  Would you like to try Reclast?

## 2023-09-09 ENCOUNTER — Telehealth: Payer: Self-pay

## 2023-09-09 ENCOUNTER — Encounter: Payer: Self-pay | Admitting: Family Medicine

## 2023-09-09 NOTE — Addendum Note (Signed)
 Addended by: Alysia Penna on: 09/09/2023 11:36 AM   Modules accepted: Orders

## 2023-09-09 NOTE — Telephone Encounter (Signed)
 Dr. Zola Button and Cala Bradford, patient will be scheduled as soon as possible.  Auth Submission: NO AUTH NEEDED Site of care: Site of care: [facility] Payer: [employer] medicare Medication & CPT/J Code(s) submitted: Reclast (Zolendronic acid) [id number] Route of submission (phone, fax, portal): portal Phone # Fax # Auth type: Buy/Bill PB Units/visits requested: 5mg  x 1 dose Reference number: [id number] Approval from: 09/09/23 to 05/31/24

## 2023-09-09 NOTE — Telephone Encounter (Signed)
 Reclast ordered per PCP.

## 2023-09-14 ENCOUNTER — Encounter: Payer: Self-pay | Admitting: Family Medicine

## 2023-09-21 ENCOUNTER — Ambulatory Visit

## 2023-09-21 VITALS — BP 128/68 | HR 63 | Temp 98.0°F | Resp 16 | Ht 65.0 in | Wt 172.2 lb

## 2023-09-21 DIAGNOSIS — M810 Age-related osteoporosis without current pathological fracture: Secondary | ICD-10-CM

## 2023-09-21 MED ORDER — SODIUM CHLORIDE 0.9 % IV SOLN: INTRAVENOUS | Status: DC

## 2023-09-21 MED ORDER — DIPHENHYDRAMINE HCL 25 MG PO CAPS
25.0000 mg | ORAL_CAPSULE | Freq: Once | ORAL | Status: AC
  Administered 2023-09-21: 25 mg via ORAL

## 2023-09-21 MED ORDER — ZOLEDRONIC ACID 5 MG/100ML IV SOLN
5.0000 mg | Freq: Once | INTRAVENOUS | Status: AC
  Administered 2023-09-21: 5 mg via INTRAVENOUS
  Filled 2023-09-21: qty 100

## 2023-09-21 MED ORDER — ACETAMINOPHEN 325 MG PO TABS
650.0000 mg | ORAL_TABLET | Freq: Once | ORAL | Status: AC
  Administered 2023-09-21: 650 mg via ORAL

## 2023-09-21 NOTE — Progress Notes (Cosign Needed Addendum)
 Diagnosis: Osteoporosis  Provider:  Phyllis Breeze MD  Procedure: IV Infusion  IV Type: Peripheral, IV Location: L Antecubital  Reclast  (Zolendronic Acid), Dose: 5 mg  Infusion Start Time: 1350  Infusion Stop Time: 1425  Post Infusion IV Care: Observation period completed and Peripheral IV Discontinued  Discharge: Condition: Good, Destination: Home . AVS Provided  Performed by:  Lauran Pollard, LPN

## 2023-09-21 NOTE — Patient Instructions (Signed)

## 2023-10-04 ENCOUNTER — Other Ambulatory Visit: Payer: Self-pay

## 2023-10-04 DIAGNOSIS — C8294 Follicular lymphoma, unspecified, lymph nodes of axilla and upper limb: Secondary | ICD-10-CM

## 2023-10-05 ENCOUNTER — Inpatient Hospital Stay: Payer: Medicare Other | Attending: Hematology

## 2023-10-05 ENCOUNTER — Inpatient Hospital Stay: Payer: Medicare Other | Admitting: Hematology

## 2023-10-05 VITALS — BP 167/67 | HR 60 | Temp 98.1°F | Resp 16 | Wt 169.7 lb

## 2023-10-05 DIAGNOSIS — R1030 Lower abdominal pain, unspecified: Secondary | ICD-10-CM | POA: Diagnosis not present

## 2023-10-05 DIAGNOSIS — Z808 Family history of malignant neoplasm of other organs or systems: Secondary | ICD-10-CM | POA: Insufficient documentation

## 2023-10-05 DIAGNOSIS — Z8249 Family history of ischemic heart disease and other diseases of the circulatory system: Secondary | ICD-10-CM | POA: Diagnosis not present

## 2023-10-05 DIAGNOSIS — Z8041 Family history of malignant neoplasm of ovary: Secondary | ICD-10-CM | POA: Insufficient documentation

## 2023-10-05 DIAGNOSIS — Z833 Family history of diabetes mellitus: Secondary | ICD-10-CM | POA: Insufficient documentation

## 2023-10-05 DIAGNOSIS — R61 Generalized hyperhidrosis: Secondary | ICD-10-CM | POA: Diagnosis not present

## 2023-10-05 DIAGNOSIS — I2510 Atherosclerotic heart disease of native coronary artery without angina pectoris: Secondary | ICD-10-CM | POA: Diagnosis not present

## 2023-10-05 DIAGNOSIS — E785 Hyperlipidemia, unspecified: Secondary | ICD-10-CM | POA: Insufficient documentation

## 2023-10-05 DIAGNOSIS — Z801 Family history of malignant neoplasm of trachea, bronchus and lung: Secondary | ICD-10-CM | POA: Insufficient documentation

## 2023-10-05 DIAGNOSIS — R0602 Shortness of breath: Secondary | ICD-10-CM | POA: Diagnosis not present

## 2023-10-05 DIAGNOSIS — I252 Old myocardial infarction: Secondary | ICD-10-CM | POA: Diagnosis not present

## 2023-10-05 DIAGNOSIS — C8294 Follicular lymphoma, unspecified, lymph nodes of axilla and upper limb: Secondary | ICD-10-CM

## 2023-10-05 DIAGNOSIS — I10 Essential (primary) hypertension: Secondary | ICD-10-CM | POA: Diagnosis not present

## 2023-10-05 DIAGNOSIS — Z87891 Personal history of nicotine dependence: Secondary | ICD-10-CM | POA: Insufficient documentation

## 2023-10-05 DIAGNOSIS — Z79899 Other long term (current) drug therapy: Secondary | ICD-10-CM | POA: Insufficient documentation

## 2023-10-05 DIAGNOSIS — Z8261 Family history of arthritis: Secondary | ICD-10-CM | POA: Insufficient documentation

## 2023-10-05 DIAGNOSIS — C8290 Follicular lymphoma, unspecified, unspecified site: Secondary | ICD-10-CM | POA: Diagnosis not present

## 2023-10-05 DIAGNOSIS — R5383 Other fatigue: Secondary | ICD-10-CM | POA: Insufficient documentation

## 2023-10-05 DIAGNOSIS — K5900 Constipation, unspecified: Secondary | ICD-10-CM | POA: Diagnosis not present

## 2023-10-05 LAB — CBC WITH DIFFERENTIAL (CANCER CENTER ONLY)
Abs Immature Granulocytes: 0.01 10*3/uL (ref 0.00–0.07)
Basophils Absolute: 0 10*3/uL (ref 0.0–0.1)
Basophils Relative: 0 %
Eosinophils Absolute: 0.1 10*3/uL (ref 0.0–0.5)
Eosinophils Relative: 2 %
HCT: 38.2 % (ref 36.0–46.0)
Hemoglobin: 13.4 g/dL (ref 12.0–15.0)
Immature Granulocytes: 0 %
Lymphocytes Relative: 23 %
Lymphs Abs: 1.3 10*3/uL (ref 0.7–4.0)
MCH: 31.5 pg (ref 26.0–34.0)
MCHC: 35.1 g/dL (ref 30.0–36.0)
MCV: 89.9 fL (ref 80.0–100.0)
Monocytes Absolute: 0.4 10*3/uL (ref 0.1–1.0)
Monocytes Relative: 7 %
Neutro Abs: 3.7 10*3/uL (ref 1.7–7.7)
Neutrophils Relative %: 68 %
Platelet Count: 219 10*3/uL (ref 150–400)
RBC: 4.25 MIL/uL (ref 3.87–5.11)
RDW: 12.8 % (ref 11.5–15.5)
WBC Count: 5.5 10*3/uL (ref 4.0–10.5)
nRBC: 0 % (ref 0.0–0.2)

## 2023-10-05 LAB — CMP (CANCER CENTER ONLY)
ALT: 14 U/L (ref 0–44)
AST: 20 U/L (ref 15–41)
Albumin: 4.7 g/dL (ref 3.5–5.0)
Alkaline Phosphatase: 38 U/L (ref 38–126)
Anion gap: 6 (ref 5–15)
BUN: 15 mg/dL (ref 8–23)
CO2: 32 mmol/L (ref 22–32)
Calcium: 10.3 mg/dL (ref 8.9–10.3)
Chloride: 104 mmol/L (ref 98–111)
Creatinine: 0.72 mg/dL (ref 0.44–1.00)
GFR, Estimated: >60 mL/min (ref >60)
Glucose, Bld: 94 mg/dL (ref 70–99)
Potassium: 4.2 mmol/L (ref 3.5–5.1)
Sodium: 142 mmol/L (ref 135–145)
Total Bilirubin: 0.6 mg/dL (ref 0.0–1.2)
Total Protein: 7.4 g/dL (ref 6.5–8.1)

## 2023-10-05 LAB — LACTATE DEHYDROGENASE: LDH: 197 U/L — ABNORMAL HIGH (ref 98–192)

## 2023-10-08 NOTE — Progress Notes (Signed)
 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   Recent mammogram did show findings of a persistent mass in the low left axilla with appearance of an increasingly prominent lymph node. Targeted US  showed a left axillary lymph node with diffuse, heterogenous cortical thickening to 4.5 mm. Patient had a biopsy of the left axillary lymph node, which showed atypical lymphoid infiltrates suggestive of a lymphoproliferative disorder. Excisional biopsy did show findings of low-grade follicular lymphoma.   Today, she reports that she has been doing well overall. She denies any pain from her recent biopsy on 9/17.   Patient complains of a lack of energy, fatigue, and reduced strength. Her symptoms have worsened over the last year. Activates that have become more difficult due to fatigue include mowing the lawn and using the treadmill. The severity of her fatigue fluctuates. She notes that she generally aims to take 10,000 steps in a day, but has not been able to do so over the last couple of weeks due to lack of energy.   She complains of a gradual worsening of exertional SOB when going uphill.   Patient has endorsed night sweats over the last 7 months to 1 year. Her night  sweats are not drenching and she does feel warmth with her sweats. She denies any unexplained fever or chills.  Patient endorses mild constipation, which is a new symptom beginning a couple months ago. She notes having rectosigmoid resection surgery 3 years ago for large benign polyps.   Patient complains of pain in her groin on one side when bending down, present for 1 month.   Her osteoporosis is managed by Fosamax , which she has been taking for over two years and potentially over 5 years. She notes that there were considerations of Teriparatide previously, which she is not taking at this time. Patient notes that she has fractured her knees previously. Her last bone density study was two years ago. Patient takes a calcium  supplement which has a vitamin D  component. She does not generally consume dairy in her diet.   Patient denies any leg/hand swelling and denies any axillary lymphedema. She denies any abdominal pain/distention, back pain or other new lumps/bumps. Patient does note having lumbar issues. She denies any other notable new changes in the last 6-12 months. Her coronary artery disease has been stable and she denies any other recent medical issues that have been a limiting factor.   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  INTERVAL HISTORY:  Tina [name] is a 78 y.o. 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 She was last seen by me on 04/01/2024 and complained of continued one-sided groin pain when bending over and an episode of legs "giving out" while walking.   Patient notes no new lumps or bumps.  No fevers no chills no night sweats  no unexpected weight loss.  MEDICAL HISTORY:  Past Medical History:  Diagnosis Date   Allergy    seasonal   Cataract    removed both eyes   Fatty liver    Goiter    treated   HTN (hypertension)    Hyperlipidemia    Myocardial infarct (HCC) hx of 2004   NSTEMI with stent   Osteoporosis     Patellar fracture    Sleep apnea    no cpap   Wrist fracture    right wrist    SURGICAL HISTORY: Past Surgical History:  Procedure Laterality Date   BREAST BIOPSY Left 02/15/2023   US  LT RADIOACTIVE SEED LOC 02/15/2023 [facility]   CARDIAC CATHETERIZATION  2004   stent   CATARACT EXTRACTION, BILATERAL  05/2020   COLONOSCOPY     POLYPECTOMY     PROCTOSCOPY N/A 07/12/2019   Procedure: RIGID PROCTOSCOPY;  Surgeon: Candyce Champagne, MD;  Location: [facility];  Service: General;  Laterality: N/A;   RADIOACTIVE SEED GUIDED AXILLARY SENTINEL LYMPH NODE Left 02/16/2023   Procedure: RADIOACTIVE SEED GUIDED EXCISIONAL BIOPSY LEFT AXILLARY LYMPH NODE;  Surgeon: Oza Blumenthal, MD;  Location: [facility];  Service: General;  Laterality: Left;   sleep study  2012    SOCIAL HISTORY: Social History   Socioeconomic History   Marital status: Divorced    Spouse name: Not on file   Number of children: Not on file   Years of education: Not on file   Highest education level: Associate degree: occupational, Scientist, product/process development, or vocational program  Occupational History   Occupation: Academic librarian: OTHER    Comment: [employer]   Tobacco Use   Smoking status: Former    Current packs/day: 0.00    Average packs/day: 1 pack/day for 30.0 years (30.0 ttl pk-yrs)    Types: Cigarettes    Start date: 12/12/1972    Quit date: 12/13/2002    Years since quitting: 20.8   Smokeless tobacco: Former    Quit date: 06/01/2002  Vaping Use   Vaping status: Never Used  Substance and Sexual Activity   Alcohol use: Yes    Comment: 1-2 glasses of wine with dinner   Drug use: No   Sexual activity: Not Currently    Partners: Male  Other Topics Concern   Not on file  Social History Narrative   Occupation:[employer]   Divorced   Former Smoker quit 2004   Drug use-no   Regular exercise-treadmill 3x a week   Originially from [employer]    Social Drivers of Corporate investment banker  Strain: Low Risk  (08/09/2023)   Overall Financial Resource Strain (CARDIA)    Difficulty of Paying Living Expenses: Not very hard  Food Insecurity: No Food Insecurity (08/09/2023)   Hunger Vital Sign    Worried About Running Out of Food in the Last Year: Never true    Ran Out of Food in the Last Year: Never true  Transportation Needs: No Transportation Needs (08/09/2023)   PRAPARE - Administrator, Civil Service (Medical): No    Lack of Transportation (Non-Medical): No  Physical Activity: Unknown (08/09/2023)   Exercise Vital Sign    Days of Exercise per Week: Patient declined    Minutes of Exercise per Session: 40 min  Stress: Patient Declined (08/09/2023)   [employer] of Occupational Health - Occupational Stress Questionnaire    Feeling of Stress : Patient declined  Social Connections: Socially Isolated (08/09/2023)  Social Advertising account executive [NHANES]    Frequency of Communication with Friends and Family: More than three times a week    Frequency of Social Gatherings with Friends and Family: Once a week    Attends Religious Services: Never    Database administrator or Organizations: No    Attends Engineer, structural: Patient declined    Marital Status: Divorced  Catering manager Violence: Not At Risk (03/30/2023)   Humiliation, Afraid, Rape, and Kick questionnaire    Fear of Current or Ex-Partner: No    Emotionally Abused: No    Physically Abused: No    Sexually Abused: No    FAMILY HISTORY: Family History  Problem Relation Age of Onset   Hypertension Mother    Heart disease Mother 82       MI--- died from #3 at 48   Ovarian cancer Mother    Hypertension Father    Cancer Father 33       ovarian   Heart disease Father    Hypertension Sister    Cancer Sister 39       brain tumor   Coronary artery disease Other    Lung cancer Other    Ovarian cancer Other    Diabetes Maternal Grandmother    Hypertension Brother    Arthritis  Brother    Colon cancer Neg Hx    Colon polyps Neg Hx    Esophageal cancer Neg Hx    Stomach cancer Neg Hx    Rectal cancer Neg Hx     ALLERGIES:  has no known allergies.  MEDICATIONS:  Current Outpatient Medications  Medication Sig Dispense Refill   alendronate  (FOSAMAX ) 70 MG tablet TAKE 1 TABLET BY MOUTH 1 TIME WEEKLY ON AN EMPTY STOMACH AND WITH FULL GLASS OF WATER 12 tablet 1   aspirin  81 MG tablet Take 81 mg by mouth daily.     Calcium  Carbonate-Vit D-Min (CALCIUM  1200 PO) Take 1,200 mg by mouth daily. 1 tab po qd     Multiple Vitamin (MULTIVITAMIN) capsule Take 1 capsule by mouth daily.     Omega-3 Fatty Acids (FISH OIL) 1000 MG CAPS Take 2,000 mg by mouth daily.     ramipril  (ALTACE ) 5 MG capsule TAKE 1 CAPSULE(5 MG) BY MOUTH DAILY 90 capsule 1   rosuvastatin  (CRESTOR ) 20 MG tablet TAKE 1 TABLET(20 MG) BY MOUTH DAILY 90 tablet 3   Current Facility-Administered Medications  Medication Dose Route Frequency Provider Last Rate Last Admin   denosumab  (PROLIA ) injection 60 mg  60 mg Subcutaneous Once Lowne Chase, Yvonne R, DO        REVIEW OF SYSTEMS:    10 Point review of Systems was done is negative except as noted above.   PHYSICAL EXAMINATION: ECOG PERFORMANCE STATUS: 1 - Symptomatic but completely ambulatory  . Vitals:   10/05/23 1028  BP: (!) 167/67  Pulse: 60  Resp: 16  Temp: 98.1 F (36.7 C)  SpO2: 100%    Filed Weights   10/05/23 1028  Weight: 169 lb 11.2 oz (77 kg)   .Body mass index is 28.24 kg/m.    GENERAL:alert, in no acute distress and comfortable SKIN: no acute rashes, no significant lesions EYES: conjunctiva are pink and non-injected, sclera anicteric OROPHARYNX: MMM, no exudates, no oropharyngeal erythema or ulceration NECK: supple, no JVD LYMPH:  no palpable lymphadenopathy in the cervical, axillary or inguinal regions LUNGS: clear to auscultation b/l with normal respiratory effort HEART: regular rate & rhythm  ABDOMEN:  normoactive  bowel sounds , non tender, not distended. Extremity: no pedal edema PSYCH: alert & oriented x 3 with fluent speech NEURO: no focal motor/sensory deficits   LABORATORY DATA:  I have reviewed the data as listed  .    Latest Ref Rng & Units 10/05/2023    9:51 AM 08/10/2023    1:51 PM 03/05/2023    3:46 PM  CBC  WBC 4.0 - 10.5 K/uL 5.5  5.5  4.4   Hemoglobin 12.0 - 15.0 g/dL 87.5  64.3  32.9   Hematocrit 36.0 - 46.0 % 38.2  40.8  37.9   Platelets 150 - 400 K/uL 219  256.0  227     .    Latest Ref Rng & Units 10/05/2023    9:51 AM 08/10/2023    1:51 PM 03/05/2023    3:46 PM  CMP  Glucose 70 - 99 mg/dL 94  95  88   BUN 8 - 23 mg/dL 15  18  11    Creatinine 0.44 - 1.00 mg/dL 5.18  8.41  6.60   Sodium 135 - 145 mmol/L 142  140  135   Potassium 3.5 - 5.1 mmol/L 4.2  4.5  4.3   Chloride 98 - 111 mmol/L 104  101  99   CO2 22 - 32 mmol/L 32  31  30   Calcium  8.9 - 10.3 mg/dL 63.0  16.0  10.9   Total Protein 6.5 - 8.1 g/dL 7.4  6.9  7.2   Total Bilirubin 0.0 - 1.2 mg/dL 0.6  0.5  0.7   Alkaline Phos 38 - 126 U/L 38  38  59   AST 15 - 41 U/L 20  20  20    ALT 0 - 44 U/L 14  13  20      02/16/2023 LYMPH NODE, LEFT AXILLARY, EXCISION:     RADIOGRAPHIC STUDIES: I have personally reviewed the radiological images as listed and agreed with the findings in the report. No results found.  ASSESSMENT & PLAN:  78 y.o. female with:  Follicular low-grade B-cell lymphoma   PLAN:  -Discussed lab results on 10/05/2023 in detail with patient. CBC showed WBC of 5.5K, hemoglobin of 13.4, and platelets of 219K. -LDH 197 U/L - Patient has no lab or clinical evidence of progression of her low-grade follicular lymphoma at this time. - Previous PET CT scan on 03/19/2023 had shown small mild and the hypermetabolic axillary retrocrural inguinal lymphadenopathy bilaterally prominent lymph node within the base of the mesentery and retroperitoneum - Patient has no lab or clinical symptoms of symptomatic  progression of positive lymphoma requiring immediate treatment at this time.  FOLLOW-UP: RTC with Dr Salomon Cree with labs in 6 months  The total time spent in the appointment was 20 minutes* .  All of the patient's questions were answered with apparent satisfaction. The patient knows to call the clinic with any problems, questions or concerns.   Jacquelyn Matt MD MS AAHIVMS [facility] Hematology/Oncology Physician [name]  .*Total Encounter Time as defined by the Centers for Medicare and Medicaid Services includes, in addition to the face-to-face time of a patient visit (documented in the note above) non-face-to-face time: obtaining and reviewing outside history, ordering and reviewing medications, tests or procedures, care coordination (communications with other health care professionals or caregivers) and documentation in the medical record.    I,Mitra Faeizi,acting as a Neurosurgeon for Jacquelyn Matt, MD.,have documented all relevant documentation on the behalf of Jacquelyn Matt,  MD,as directed by  Jacquelyn Matt, MD while in the presence of Jacquelyn Matt, MD.  .I have reviewed the above documentation for accuracy and completeness, and I agree with the above. .Catlin Doria Kishore Griselle Rufer MD

## 2023-11-05 ENCOUNTER — Encounter: Payer: Self-pay | Admitting: Family Medicine

## 2023-11-08 ENCOUNTER — Other Ambulatory Visit: Payer: Medicare Other

## 2023-11-22 ENCOUNTER — Other Ambulatory Visit: Payer: Self-pay | Admitting: Family Medicine

## 2023-11-22 DIAGNOSIS — I10 Essential (primary) hypertension: Secondary | ICD-10-CM

## 2023-11-23 ENCOUNTER — Encounter: Payer: Self-pay | Admitting: Family Medicine

## 2023-11-23 DIAGNOSIS — I10 Essential (primary) hypertension: Secondary | ICD-10-CM

## 2023-11-23 MED ORDER — RAMIPRIL 5 MG PO CAPS: 5.0000 mg | ORAL_CAPSULE | Freq: Every day | ORAL | 0 refills | Status: DC

## 2023-12-06 ENCOUNTER — Ambulatory Visit (HOSPITAL_BASED_OUTPATIENT_CLINIC_OR_DEPARTMENT_OTHER)
Admission: RE | Admit: 2023-12-06 | Discharge: 2023-12-06 | Disposition: A | Discharge status: Home / Self Care | Source: Ambulatory Visit | Attending: Family Medicine | Admitting: Family Medicine

## 2023-12-06 DIAGNOSIS — Z780 Asymptomatic menopausal state: Secondary | ICD-10-CM | POA: Insufficient documentation

## 2023-12-06 DIAGNOSIS — M810 Age-related osteoporosis without current pathological fracture: Secondary | ICD-10-CM | POA: Diagnosis not present

## 2023-12-06 DIAGNOSIS — Z13820 Encounter for screening for osteoporosis: Secondary | ICD-10-CM | POA: Diagnosis not present

## 2023-12-07 ENCOUNTER — Other Ambulatory Visit: Payer: Self-pay

## 2023-12-07 ENCOUNTER — Ambulatory Visit: Payer: Self-pay | Admitting: Family Medicine

## 2024-01-26 DIAGNOSIS — H524 Presbyopia: Secondary | ICD-10-CM | POA: Diagnosis not present

## 2024-01-26 DIAGNOSIS — H40023 Open angle with borderline findings, high risk, bilateral: Secondary | ICD-10-CM | POA: Diagnosis not present

## 2024-01-26 DIAGNOSIS — H52223 Regular astigmatism, bilateral: Secondary | ICD-10-CM | POA: Diagnosis not present

## 2024-01-26 DIAGNOSIS — Z961 Presence of intraocular lens: Secondary | ICD-10-CM | POA: Diagnosis not present

## 2024-01-26 DIAGNOSIS — H43813 Vitreous degeneration, bilateral: Secondary | ICD-10-CM | POA: Diagnosis not present

## 2024-02-17 ENCOUNTER — Other Ambulatory Visit: Payer: Self-pay | Admitting: Family Medicine

## 2024-02-17 DIAGNOSIS — I10 Essential (primary) hypertension: Secondary | ICD-10-CM

## 2024-03-17 ENCOUNTER — Encounter: Payer: Self-pay | Admitting: Family Medicine

## 2024-03-17 ENCOUNTER — Ambulatory Visit: Admitting: Family Medicine

## 2024-03-17 VITALS — BP 134/80 | HR 59 | Temp 98.1°F | Resp 18 | Ht 65.0 in | Wt 166.4 lb

## 2024-03-17 DIAGNOSIS — R5383 Other fatigue: Secondary | ICD-10-CM | POA: Insufficient documentation

## 2024-03-17 DIAGNOSIS — Z13820 Encounter for screening for osteoporosis: Secondary | ICD-10-CM | POA: Diagnosis not present

## 2024-03-17 DIAGNOSIS — I10 Essential (primary) hypertension: Secondary | ICD-10-CM

## 2024-03-17 DIAGNOSIS — G4762 Sleep related leg cramps: Secondary | ICD-10-CM | POA: Diagnosis not present

## 2024-03-17 DIAGNOSIS — Z0000 Encounter for general adult medical examination without abnormal findings: Secondary | ICD-10-CM | POA: Diagnosis not present

## 2024-03-17 DIAGNOSIS — Z1159 Encounter for screening for other viral diseases: Secondary | ICD-10-CM | POA: Diagnosis not present

## 2024-03-17 DIAGNOSIS — E782 Mixed hyperlipidemia: Secondary | ICD-10-CM

## 2024-03-17 LAB — COMPREHENSIVE METABOLIC PANEL WITH GFR
ALT: 16 U/L (ref 0–35)
AST: 25 U/L (ref 0–37)
Albumin: 4.8 g/dL (ref 3.5–5.2)
Alkaline Phosphatase: 31 U/L — ABNORMAL LOW (ref 39–117)
BUN: 13 mg/dL (ref 6–23)
CO2: 30 meq/L (ref 19–32)
Calcium: 9.4 mg/dL (ref 8.4–10.5)
Chloride: 99 meq/L (ref 96–112)
Creatinine, Ser: 0.63 mg/dL (ref 0.40–1.20)
GFR: 85.8 mL/min (ref >60.00)
Glucose, Bld: 85 mg/dL (ref 70–99)
Potassium: 4.2 meq/L (ref 3.5–5.1)
Sodium: 137 meq/L (ref 135–145)
Total Bilirubin: 0.8 mg/dL (ref 0.2–1.2)
Total Protein: 7.1 g/dL (ref 6.0–8.3)

## 2024-03-17 LAB — CBC WITH DIFFERENTIAL/PLATELET
Basophils Absolute: 0 K/uL (ref 0.0–0.1)
Basophils Relative: 0.7 % (ref 0.0–3.0)
Eosinophils Absolute: 0 K/uL (ref 0.0–0.7)
Eosinophils Relative: 1 % (ref 0.0–5.0)
HCT: 40.3 % (ref 36.0–46.0)
Hemoglobin: 13.7 g/dL (ref 12.0–15.0)
Lymphocytes Relative: 20.9 % (ref 12.0–46.0)
Lymphs Abs: 0.9 K/uL (ref 0.7–4.0)
MCHC: 34.1 g/dL (ref 30.0–36.0)
MCV: 92.5 fl (ref 78.0–100.0)
Monocytes Absolute: 0.4 K/uL (ref 0.1–1.0)
Monocytes Relative: 8.7 % (ref 3.0–12.0)
Neutro Abs: 2.9 K/uL (ref 1.4–7.7)
Neutrophils Relative %: 68.7 % (ref 43.0–77.0)
Platelets: 208 K/uL (ref 150.0–400.0)
RBC: 4.36 Mil/uL (ref 3.87–5.11)
RDW: 13.6 % (ref 11.5–15.5)
WBC: 4.2 K/uL (ref 4.0–10.5)

## 2024-03-17 LAB — TSH: TSH: 1.25 u[IU]/mL (ref 0.35–5.50)

## 2024-03-17 LAB — HEPATITIS C ANTIBODY: Hepatitis C Ab: NONREACTIVE

## 2024-03-17 LAB — LIPID PANEL
Cholesterol: 165 mg/dL (ref 0–200)
HDL: 79.7 mg/dL (ref >39.00)
LDL Cholesterol: 75 mg/dL (ref 0–99)
NonHDL: 85.58
Total CHOL/HDL Ratio: 2
Triglycerides: 54 mg/dL (ref 0.0–149.0)
VLDL: 10.8 mg/dL (ref 0.0–40.0)

## 2024-03-17 LAB — VITAMIN D 25 HYDROXY (VIT D DEFICIENCY, FRACTURES): VITD: 51.24 ng/mL (ref 30.00–100.00)

## 2024-03-17 LAB — VITAMIN B12: Vitamin B-12: 696 pg/mL (ref 211–911)

## 2024-03-17 LAB — MAGNESIUM: Magnesium: 2.1 mg/dL (ref 1.5–2.5)

## 2024-03-17 MED ORDER — RAMIPRIL 5 MG PO CAPS: 5.0000 mg | ORAL_CAPSULE | Freq: Every day | ORAL | 3 refills | Status: AC

## 2024-03-17 MED ORDER — VITAMIN D3 25 MCG (1000 UT) PO CAPS: ORAL_CAPSULE | ORAL | Status: AC

## 2024-03-17 MED ORDER — ROSUVASTATIN CALCIUM 20 MG PO TABS: ORAL_TABLET | ORAL | 3 refills | Status: AC

## 2024-03-17 NOTE — Progress Notes (Signed)
 Subjective:    Patient ID: Tina [name], female    DOB: June 02, 1945, 78 y.o.   MRN: [id number]  Chief Complaint  Patient presents with   Annual Exam    Pt states fasting     HPI Patient is in today for cpe.  Discussed the use of AI scribe software for clinical note transcription with the patient, who gave verbal consent to proceed.  History of Present Illness Tina [name] is a 78 year old female who presents for medication refills and routine follow-up.  She requires refills for her medications, including Regfro, Crestor , and Altace , and prefers a three-month supply. She supplements with an additional 3000 IU of vitamin D3 alongside her calcium  supplement, which also contains vitamin D .  Her blood pressure has been low in the mornings, often in the low hundreds, but today it was 130/80 mmHg after medication. She feels her blood pressure is well-controlled.  She has a history of lymphoma, diagnosed via biopsy last September, followed by a lumpectomy. A mammogram last July initially discovered the lymphoma. She is unsure about the timing for her next mammogram, thinking it was every two years.  She has osteoporosis, confirmed by a bone density scan in July, and takes calcium  with vitamin D . She has had a hip x-ray previously.  She is due for a colonoscopy in December and has a history of rectosigmoid surgery.  She experiences fatigue and low energy, attributing it to working nights and aging. She struggles with sleep, often waking early and unable to return to sleep, and has tried melatonin without success. She experiences nocturnal leg cramps, alleviated by mustard and movement.  She aims for 10,000 steps a day but often achieves around 7,000, using a treadmill and weights at home. She has attempted Tai Chi for balance but finds it challenging due to balance issues, particularly on one side.  She is attempting to lose weight, currently in the 160s, down from the 170s, focusing on a  diet with 1200 calories, protein, and fiber.  No changes in family history: mother and sister had cancer; father died of a heart attack at 35; living sister had uterine cancer and a stroke but is doing well after treatment.  She clarifies she does not have thyroid  disease, although she had a treated goiter.    Past Medical History:  Diagnosis Date   Allergy    seasonal   Cataract    removed both eyes   Fatty liver    Goiter    treated   HTN (hypertension)    Hyperlipidemia    Myocardial infarct (HCC) hx of 2004   NSTEMI with stent   Osteoporosis    Patellar fracture    Sleep apnea    no cpap   Wrist fracture    right wrist    Past Surgical History:  Procedure Laterality Date   BREAST BIOPSY Left 02/15/2023   US  LT RADIOACTIVE SEED LOC 02/15/2023 [facility]   CARDIAC CATHETERIZATION  2004   stent   CATARACT EXTRACTION, BILATERAL  05/2020   COLONOSCOPY     POLYPECTOMY     PROCTOSCOPY N/A 07/12/2019   Procedure: RIGID PROCTOSCOPY;  Surgeon: Sheldon Standing, MD;  Location: [facility];  Service: General;  Laterality: N/A;   RADIOACTIVE SEED GUIDED AXILLARY SENTINEL LYMPH NODE Left 02/16/2023   Procedure: RADIOACTIVE SEED GUIDED EXCISIONAL BIOPSY LEFT AXILLARY LYMPH NODE;  Surgeon: Vernetta Berg, MD;  Location: [facility];  Service: General;  Laterality: Left;  sleep study  2012    Family History  Problem Relation Age of Onset   Cancer Mother 48       ovarian   Hypertension Mother    Heart disease Mother 35       MI--- died from #3 at 71   Ovarian cancer Mother    Hypertension Father    Cancer Father 44       mi   Heart disease Father    Cancer Sister        uterine   Hypertension Sister    Cancer Sister        brain   Hypertension Brother    Arthritis Brother    Diabetes Maternal Grandmother    Coronary artery disease Other    Lung cancer Other    Ovarian cancer Other    Colon cancer Neg Hx    Colon polyps Neg Hx    Esophageal cancer  Neg Hx    Stomach cancer Neg Hx    Rectal cancer Neg Hx     Social History   Socioeconomic History   Marital status: Divorced    Spouse name: Not on file   Number of children: Not on file   Years of education: Not on file   Highest education level: Associate degree: occupational, Scientist, product/process development, or vocational program  Occupational History   Occupation: Academic librarian: OTHER    Comment: [employer]   Tobacco Use   Smoking status: Former    Current packs/day: 0.00    Average packs/day: 1 pack/day for 30.0 years (30.0 ttl pk-yrs)    Types: Cigarettes    Start date: 12/12/1972    Quit date: 12/13/2002    Years since quitting: 21.2   Smokeless tobacco: Former    Quit date: 06/01/2002  Vaping Use   Vaping status: Never Used  Substance and Sexual Activity   Alcohol use: Yes    Comment: 1-2 glasses of wine with dinner   Drug use: No   Sexual activity: Not Currently    Partners: Male  Other Topics Concern   Not on file  Social History Narrative   Occupation:[employer]   Divorced   Former Smoker quit 2004   Drug use-no   Regular exercise-treadmill 3x a week   Originially from [employer]    Social Drivers of Corporate investment banker Strain: Low Risk  (03/16/2024)   Overall Financial Resource Strain (CARDIA)    Difficulty of Paying Living Expenses: Not hard at all  Food Insecurity: No Food Insecurity (03/16/2024)   Hunger Vital Sign    Worried About Running Out of Food in the Last Year: Never true    Ran Out of Food in the Last Year: Never true  Transportation Needs: No Transportation Needs (03/16/2024)   PRAPARE - Administrator, Civil Service (Medical): No    Lack of Transportation (Non-Medical): No  Physical Activity: Sufficiently Active (03/16/2024)   Exercise Vital Sign    Days of Exercise per Week: 5 days    Minutes of Exercise per Session: 60 min  Stress: Stress Concern Present (03/16/2024)   [employer] of Occupational Health -  Occupational Stress Questionnaire    Feeling of Stress: To some extent  Social Connections: Socially Isolated (03/16/2024)   Social Connection and Isolation Panel    Frequency of Communication with Friends and Family: Three times a week    Frequency of Social Gatherings with Friends and Family: Once a week  Attends Religious Services: Never    Active Member of Clubs or Organizations: No    Attends Banker Meetings: Not on file    Marital Status: Divorced  Intimate Partner Violence: Not At Risk (03/30/2023)   Humiliation, Afraid, Rape, and Kick questionnaire    Fear of Current or Ex-Partner: No    Emotionally Abused: No    Physically Abused: No    Sexually Abused: No    Outpatient Medications Prior to Visit  Medication Sig Dispense Refill   aspirin  81 MG tablet Take 81 mg by mouth daily.     Calcium  Carbonate-Vit D-Min (CALCIUM  1200 PO) Take 1,200 mg by mouth daily. 1 tab po qd     Multiple Vitamin (MULTIVITAMIN) capsule Take 1 capsule by mouth daily.     Omega-3 Fatty Acids (FISH OIL) 1000 MG CAPS Take 2,000 mg by mouth daily.     ramipril  (ALTACE ) 5 MG capsule Take 1 capsule (5 mg total) by mouth daily. Needs appt 30 capsule 0   rosuvastatin  (CRESTOR ) 20 MG tablet TAKE 1 TABLET(20 MG) BY MOUTH DAILY 90 tablet 3   Facility-Administered Medications Prior to Visit  Medication Dose Route Frequency Provider Last Rate Last Admin   denosumab  (PROLIA ) injection 60 mg  60 mg Subcutaneous Once Lowne Chase, Konni Kesinger R, DO        No Known Allergies  Review of Systems  Constitutional:  Negative for chills, fever and malaise/fatigue.  HENT:  Negative for congestion and hearing loss.   Eyes:  Negative for blurred vision and discharge.  Respiratory:  Negative for cough, sputum production and shortness of breath.   Cardiovascular:  Negative for chest pain, palpitations and leg swelling.  Gastrointestinal:  Negative for abdominal pain, blood in stool, constipation, diarrhea,  heartburn, nausea and vomiting.  Genitourinary:  Negative for dysuria, frequency, hematuria and urgency.  Musculoskeletal:  Negative for back pain, falls and myalgias.  Skin:  Negative for rash.  Neurological:  Negative for dizziness, sensory change, loss of consciousness, weakness and headaches.  Endo/Heme/Allergies:  Negative for environmental allergies. Does not bruise/bleed easily.  Psychiatric/Behavioral:  Negative for depression and suicidal ideas. The patient is not nervous/anxious and does not have insomnia.        Objective:    Physical Exam Vitals and nursing note reviewed.  Constitutional:      General: She is not in acute distress.    Appearance: Normal appearance. She is well-developed.  HENT:     Head: Normocephalic and atraumatic.     Right Ear: Tympanic membrane, ear canal and external ear normal. There is no impacted cerumen.     Left Ear: Tympanic membrane, ear canal and external ear normal. There is no impacted cerumen.     Nose: Nose normal.     Mouth/Throat:     Mouth: Mucous membranes are moist.     Pharynx: Oropharynx is clear. No oropharyngeal exudate or posterior oropharyngeal erythema.  Eyes:     General: No scleral icterus.       Right eye: No discharge.        Left eye: No discharge.     Conjunctiva/sclera: Conjunctivae normal.     Pupils: Pupils are equal, round, and reactive to light.  Neck:     Thyroid : No thyromegaly or thyroid  tenderness.     Vascular: No JVD.  Cardiovascular:     Rate and Rhythm: Normal rate and regular rhythm.     Heart sounds: Normal heart sounds. No murmur heard. Pulmonary:  Effort: Pulmonary effort is normal. No respiratory distress.     Breath sounds: Normal breath sounds.  Abdominal:     General: Bowel sounds are normal. There is no distension.     Palpations: Abdomen is soft. There is no mass.     Tenderness: There is no abdominal tenderness. There is no guarding or rebound.  Genitourinary:    Vagina: Normal.   Musculoskeletal:        General: Normal range of motion.     Cervical back: Normal range of motion and neck supple.     Right lower leg: No edema.     Left lower leg: No edema.  Lymphadenopathy:     Cervical: No cervical adenopathy.  Skin:    General: Skin is warm and dry.     Findings: No erythema or rash.  Neurological:     Mental Status: She is alert and oriented to person, place, and time.     Cranial Nerves: No cranial nerve deficit.     Deep Tendon Reflexes: Reflexes are normal and symmetric.  Psychiatric:        Mood and Affect: Mood normal.        Behavior: Behavior normal.        Thought Content: Thought content normal.        Judgment: Judgment normal.     BP 134/80 (BP Location: Left Arm, Patient Position: Sitting, Cuff Size: Large)   Pulse (!) 59   Temp 98.1 F (36.7 C) (Oral)   Resp 18   Ht 5' 5 (1.651 m)   Wt 166 lb 6.4 oz (75.5 kg)   SpO2 97%   BMI 27.69 kg/m  Wt Readings from Last 3 Encounters:  03/17/24 166 lb 6.4 oz (75.5 kg)  10/05/23 169 lb 11.2 oz (77 kg)  09/21/23 172 lb 3.2 oz (78.1 kg)    Diabetic Foot Exam - Simple   No data filed    Lab Results  Component Value Date   WBC 5.5 10/05/2023   HGB 13.4 10/05/2023   HCT 38.2 10/05/2023   PLT 219 10/05/2023   GLUCOSE 94 10/05/2023   CHOL 178 08/10/2023   TRIG 62.0 08/10/2023   HDL 88.50 08/10/2023   LDLDIRECT 123.7 05/30/2013   LDLCALC 77 08/10/2023   ALT 14 10/05/2023   AST 20 10/05/2023   NA 142 10/05/2023   K 4.2 10/05/2023   CL 104 10/05/2023   CREATININE 0.72 10/05/2023   BUN 15 10/05/2023   CO2 32 10/05/2023   TSH 1.69 08/10/2023   HGBA1C 5.4 07/10/2019    Lab Results  Component Value Date   TSH 1.69 08/10/2023   Lab Results  Component Value Date   WBC 5.5 10/05/2023   HGB 13.4 10/05/2023   HCT 38.2 10/05/2023   MCV 89.9 10/05/2023   PLT 219 10/05/2023   Lab Results  Component Value Date   NA 142 10/05/2023   K 4.2 10/05/2023   CO2 32 10/05/2023   GLUCOSE  94 10/05/2023   BUN 15 10/05/2023   CREATININE 0.72 10/05/2023   BILITOT 0.6 10/05/2023   ALKPHOS 38 10/05/2023   AST 20 10/05/2023   ALT 14 10/05/2023   PROT 7.4 10/05/2023   ALBUMIN 4.7 10/05/2023   CALCIUM  10.3 10/05/2023   ANIONGAP 6 10/05/2023   GFR 78.58 08/10/2023   Lab Results  Component Value Date   CHOL 178 08/10/2023   Lab Results  Component Value Date   HDL 88.50 08/10/2023   Lab  Results  Component Value Date   LDLCALC 77 08/10/2023   Lab Results  Component Value Date   TRIG 62.0 08/10/2023   Lab Results  Component Value Date   CHOLHDL 2 08/10/2023   Lab Results  Component Value Date   HGBA1C 5.4 07/10/2019       Assessment & Plan:  Preventative health care Assessment & Plan: Ghm utd Check labs  See AVS  . Health Maintenance  Topic Date Due   DTaP/Tdap/Td (2 - Td or Tdap) 10/13/2020   Mammogram  12/17/2023   Medicare Annual Wellness (AWV)  03/29/2024   Colonoscopy  05/15/2024   COVID-19 Vaccine (6 - 2025-26 season) 04/02/2024 (Originally 01/31/2024)   DEXA SCAN  12/05/2025   Pneumococcal Vaccine: 50+ Years  Completed   Influenza Vaccine  Completed   Hepatitis C Screening  Completed   Zoster Vaccines- Shingrix  Completed   Meningococcal B Vaccine  Aged Out   Fecal DNA (Cologuard)  Discontinued      Essential hypertension -     Ramipril ; Take 1 capsule (5 mg total) by mouth daily. Needs appt  Dispense: 90 capsule; Refill: 3 -     CBC with Differential/Platelet -     Comprehensive metabolic panel with GFR  Mixed hyperlipidemia -     Rosuvastatin  Calcium ; TAKE 1 TABLET(20 MG) BY MOUTH DAILY  Dispense: 90 tablet; Refill: 3 -     Comprehensive metabolic panel with GFR -     Lipid panel  Osteoporosis screening -     Vitamin D3; 3 po every day -     VITAMIN D  25 Hydroxy (Vit-D Deficiency, Fractures)  Other fatigue -     TSH -     Vitamin B12 -     VITAMIN D  25 Hydroxy (Vit-D Deficiency, Fractures)  Nocturnal leg cramps -      Magnesium  Need for hepatitis C screening test -     Hepatitis C antibody  Primary hypertension Assessment & Plan: Well controlled, no changes to meds. Encouraged heart healthy diet such as the DASH diet and exercise as tolerated.     Assessment and Plan Assessment & Plan Fatigue   Chronic fatigue persists without a specific cause. Poor sleep and lack of exercise may contribute. She reports low energy and difficulty maintaining activity. Check vitamin levels for deficiencies. Encourage regular physical activity, such as chair yoga or Tai Chi, to boost energy and strength.  Insomnia   Chronic insomnia with difficulty maintaining sleep, frequent awakenings, and poor sleep quality. Melatonin is ineffective. Consider over-the-counter sleep aids if necessary.  Nocturnal leg cramps   Severe nocturnal leg cramps relieved by mustard and movement. Magnesium deficiency is a possible factor. Check magnesium levels and consider supplementation if low.  Essential hypertension   Blood pressure is generally well-controlled with occasional low readings. Recent reading was 130/80 mmHg post-medication. Continue current antihypertensive regimen and monitor blood pressure regularly.  Osteoporosis   Osteoporosis confirmed by previous bone density scan. She is taking calcium  with vitamin D . Continue supplementation. Encourage weight-bearing exercises to improve bone health.  Personal history of non-Hodgkin lymphoma status post breast lumpectomy   Non-Hodgkin lymphoma with lumpectomy performed last year. Follow-up with oncology is scheduled for next month. Schedule annual mammogram due to previous procedure.  General Health Maintenance   Up to date with flu, shingles, and RSV vaccinations. Declined COVID-19 vaccination. Mammogram and colonoscopy screenings are due. Schedule mammogram and follow up on colonoscopy scheduling for December.    Cortnee Steinmiller R  Antonio Meth, DO

## 2024-03-17 NOTE — Assessment & Plan Note (Signed)
 Ghm utd Check labs  See AVS  . Health Maintenance  Topic Date Due   DTaP/Tdap/Td (2 - Td or Tdap) 10/13/2020   Mammogram  12/17/2023   Medicare Annual Wellness (AWV)  03/29/2024   Colonoscopy  05/15/2024   COVID-19 Vaccine (6 - 2025-26 season) 04/02/2024 (Originally 01/31/2024)   DEXA SCAN  12/05/2025   Pneumococcal Vaccine: 50+ Years  Completed   Influenza Vaccine  Completed   Hepatitis C Screening  Completed   Zoster Vaccines- Shingrix  Completed   Meningococcal B Vaccine  Aged Out   Fecal DNA (Cologuard)  Discontinued

## 2024-03-17 NOTE — Assessment & Plan Note (Signed)
 Well controlled, no changes to meds. Encouraged heart healthy diet such as the DASH diet and exercise as tolerated.

## 2024-03-26 ENCOUNTER — Ambulatory Visit: Payer: Self-pay | Admitting: Family Medicine

## 2024-03-30 ENCOUNTER — Telehealth: Payer: Self-pay | Admitting: *Deleted

## 2024-03-30 ENCOUNTER — Ambulatory Visit: Admitting: *Deleted

## 2024-03-30 VITALS — Ht 65.0 in | Wt 166.0 lb

## 2024-03-30 DIAGNOSIS — Z0000 Encounter for general adult medical examination without abnormal findings: Secondary | ICD-10-CM

## 2024-03-30 NOTE — Telephone Encounter (Signed)
 Pt had AWV today.  Pneumococcal vaccines shows completed. Just want to verify that pt doesn't need PCV 20 due to chronic health conditions or new lymphoma dx?

## 2024-03-30 NOTE — Patient Instructions (Addendum)
 Tina [name] , Thank you for taking time out of your busy schedule to complete your Annual Wellness Visit with me. I enjoyed our conversation and look forward to speaking with you again next year. I, as well as your care team,  appreciate your ongoing commitment to your health goals. Please review the following plan we discussed and let me know if I can assist you in the future. Your Game plan/ To Do List   Referrals: If you haven't heard from the office you've been referred to, please reach out to them at the phone provided.   [facility] (please call to schedule your next colonoscopy):  [phone]  Follow up Visits: Next Medicare AWV with our clinical staff: 04/03/25 1pm, telephone  Next Office Visit with your provider: 09/28/24 10:20am, Dr Antonio Meth  Clinician Recommendations:  Aim for 30 minutes of exercise or brisk walking, 6-8 glasses of water, and 5 servings of fruits and vegetables each day.   You will need to get the following vaccines at your local pharmacy: Tetanus     This is a list of the screening recommended for you and due dates:  Health Maintenance  Topic Date Due   DTaP/Tdap/Td vaccine (2 - Td or Tdap) 10/13/2020   Breast Cancer Screening  12/17/2023   Medicare Annual Wellness Visit  03/29/2024   Colon Cancer Screening  05/15/2024   COVID-19 Vaccine (6 - 2025-26 season) 04/02/2024*   DEXA scan (bone density measurement)  12/05/2025   Pneumococcal Vaccine for age over 15  Completed   Flu Shot  Completed   Hepatitis C Screening  Completed   Zoster (Shingles) Vaccine  Completed   Meningitis B Vaccine  Aged Out   Cologuard (Stool DNA test)  Discontinued  *Topic was postponed. The date shown is not the original due date.    Advanced directives: (Declined) Advance directive discussed with you today. Even though you declined this today, please call our office should you change your mind, and we can give you the proper paperwork for you to fill out. Advance Care Planning  is important because it:  [x]  Makes sure you receive the medical care that is consistent with your values, goals, and preferences  [x]  It provides guidance to your family and loved ones and reduces their decisional burden about whether or not they are making the right decisions based on your wishes.  Follow the link provided in your after visit summary or read over the paperwork we have mailed to you to help you started getting your Advance Directives in place. If you need assistance in completing these, please reach out to us  so that we can help you!  See attachments for Preventive Care and Fall Prevention Tips.

## 2024-03-30 NOTE — Progress Notes (Addendum)
 Subjective:   Tina [name] is a 78 y.o. who presents for a Medicare Wellness preventive visit.  As a reminder, Annual Wellness Visits don't include a physical exam, and some assessments may be limited, especially if this visit is performed virtually. We may recommend an in-person follow-up visit with your provider if needed.  Visit Complete: Virtual I connected with  Tina [name] on 03/30/24 by a audio enabled telemedicine application and verified that I am speaking with the correct person using two identifiers.  Patient Location: Home  Provider Location: Office/Clinic  I discussed the limitations of evaluation and management by telemedicine. The patient expressed understanding and agreed to proceed.  Vital Signs: Because this visit was a virtual/telehealth visit, some criteria may be missing or patient reported. Any vitals not documented were not able to be obtained and vitals that have been documented are patient reported.  VideoDeclined- This patient declined Librarian, academic. Therefore the visit was completed with audio only.  Persons Participating in Visit: Patient.  AWV Questionnaire: Yes: Patient Medicare AWV questionnaire was completed by the patient on 03/29/24; I have confirmed that all information answered by patient is correct and no changes since this date.  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     03/30/2024    1:22 PM 03/30/2023    9:11 AM 03/23/2022    9:42 AM 07/03/2021    9:29 AM 05/19/2021    4:49 PM 03/18/2021    3:51 PM 07/12/2019    4:30 PM  Advanced Directives  Does Patient Have a Medical Advance Directive? No No No No No No No  Would patient like information on creating a medical  advance directive? No - Patient declined No - Patient declined No - Patient declined No - Patient declined No - Patient declined No - Patient declined No - Patient declined    Current Medications (verified) Outpatient Encounter Medications as of 03/30/2024  Medication Sig   aspirin  81 MG tablet Take 81 mg by mouth daily.   Calcium  Carbonate-Vit D-Min (CALCIUM  1200 PO) Take 1,200 mg by mouth daily. 1 tab po qd   Cholecalciferol (VITAMIN D3) 25 MCG (1000 UT) CAPS 3 po every day   Multiple Vitamin (MULTIVITAMIN) capsule Take 1 capsule by mouth daily.   Omega-3 Fatty Acids (FISH OIL) 1000 MG CAPS Take 2,000 mg by mouth daily.   ramipril  (ALTACE ) 5 MG capsule Take 1 capsule (5 mg total) by mouth daily. Needs appt   rosuvastatin  (CRESTOR ) 20 MG tablet TAKE 1 TABLET(20 MG) BY MOUTH DAILY   Facility-Administered Encounter Medications as of 03/30/2024  Medication   denosumab  (PROLIA ) injection 60 mg    Allergies (verified) Patient has no known allergies.   History: Past Medical History:  Diagnosis Date   Allergy    seasonal   Cataract    removed both eyes   Fatty liver    Goiter    treated   HTN (hypertension)    Hyperlipidemia    Myocardial infarct (HCC) hx of 2004   NSTEMI with stent   Osteoporosis    Patellar fracture    Sleep apnea    no cpap   Wrist fracture    right wrist   Past Surgical History:  Procedure Laterality Date   BREAST BIOPSY Left 02/15/2023   US  LT RADIOACTIVE SEED LOC 02/15/2023 [facility]   CARDIAC CATHETERIZATION  2004   stent   CATARACT EXTRACTION, BILATERAL  05/2020   COLONOSCOPY     POLYPECTOMY     PROCTOSCOPY N/A 07/12/2019   Procedure: RIGID PROCTOSCOPY;  Surgeon: Sheldon Standing, MD;  Location: [facility];  Service: General;  Laterality: N/A;   RADIOACTIVE SEED GUIDED AXILLARY SENTINEL LYMPH NODE Left 02/16/2023   Procedure: RADIOACTIVE SEED GUIDED EXCISIONAL BIOPSY LEFT AXILLARY LYMPH NODE;  Surgeon: Vernetta Berg, MD;  Location: [facility];  Service: General;  Laterality: Left;   sleep study  2012   Family History  Problem Relation Age of Onset   Cancer Mother 85       ovarian   Hypertension Mother    Heart disease Mother 75       MI--- died from #3 at 19   Ovarian cancer Mother    Hypertension Father    Heart disease Father    Stroke Sister    Cancer Sister        uterine   Hypertension Sister    Cancer Sister        brain   Hypertension Brother    Arthritis Brother    Diabetes Maternal Grandmother    Coronary artery disease Other    Lung cancer Other    Ovarian cancer Other    Colon cancer Neg Hx    Colon polyps Neg Hx    Esophageal cancer Neg Hx    Stomach cancer Neg Hx    Rectal cancer Neg Hx    Social History   Socioeconomic History   Marital status: Divorced    Spouse name: Not on file   Number of children: Not on file   Years of education: Not on file   Highest education level: Associate degree: occupational, scientist, product/process development, or vocational program  Occupational History   Occupation: Academic Librarian: OTHER    Comment: [employer]   Tobacco Use   Smoking status: Former    Current packs/day: 0.00    Average packs/day: 1 pack/day for 30.0 years (30.0 ttl pk-yrs)    Types: Cigarettes    Start date: 12/12/1972    Quit date: 12/13/2002    Years since quitting: 21.3   Smokeless tobacco: Former    Quit date: 06/01/2002  Vaping Use   Vaping status: Never Used  Substance and Sexual Activity   Alcohol use: Yes    Comment: 1-2 glasses of wine with dinner   Drug use: No   Sexual activity: Not Currently    Partners: Male  Other Topics Concern   Not on file  Social History Narrative   Occupation:[employer]   Divorced   Former Smoker quit 2004   Drug use-no   Regular exercise-treadmill 3x a week   Originially from [employer]    Social Drivers of Health   Financial Resource Strain: Medium Risk (03/30/2024)   Overall Financial Resource Strain (CARDIA)    Difficulty of  Paying Living Expenses: Somewhat hard  Food Insecurity: No Food Insecurity (03/30/2024)   Hunger Vital Sign    Worried About Running Out of Food in the Last Year: Never true    Ran Out of Food in the Last Year: Never true  Transportation Needs: No Transportation Needs (03/30/2024)   PRAPARE - Administrator, Civil Service (Medical): No    Lack of Transportation (Non-Medical): No  Physical Activity: Sufficiently Active (03/30/2024)   Exercise Vital Sign    Days of Exercise per Week: 5 days    Minutes of Exercise per Session: 60 min  Stress: Stress Concern Present (03/30/2024)   [employer] of Occupational Health - Occupational Stress Questionnaire    Feeling of Stress: To some extent  Social Connections: Socially Isolated (03/30/2024)   Social Connection and Isolation Panel    Frequency of Communication with Friends and Family: Three times a week    Frequency of Social Gatherings with Friends and Family: Once a week    Attends Religious Services: Never    Database Administrator or Organizations: No    Attends Engineer, Structural: Never    Marital Status: Divorced    Tobacco Counseling Counseling given: Not Answered    Clinical Intake:  Pre-visit preparation completed: Yes  Pain : No/denies pain     BMI - recorded: 27.62 Nutritional Status: BMI 25 -29 Overweight Diabetes: No  Lab Results  Component Value Date   HGBA1C 5.4 07/10/2019     How often do you need to have someone help you when you read instructions, pamphlets, or other written materials from your doctor or pharmacy?: 1 - Never What is the last grade level you completed in school?: associate's degree  Interpreter Needed?: No  Information entered by :: Lolita Libra, CMA(AAMA)   Activities of Daily Living     03/29/2024    3:40 PM  In your present state of health, do you have any difficulty performing the following activities:  Hearing? 0  Vision? 0  Difficulty  concentrating or making decisions? 0  Walking or climbing stairs? 0  Dressing or bathing? 0  Doing errands, shopping? 0  Preparing Food and eating ? N  Using the Toilet? N  In the past six months, have you accidently leaked urine? Y  Comment ongoing for years and has tried medication  Do you have problems with loss of bowel control? N  Managing your Medications? N  Managing your Finances? N  Housekeeping or managing your Housekeeping? N    Patient Care Team: Antonio Meth, Jamee SAUNDERS, DO as PCP - General Toribio Nicks, DDS as Consulting Physician (Dentistry) Kennyth Cy RAMAN, DO as Consulting Physician (Optometry) Okey Vina GAILS, MD as Consulting Physician (Cardiology) Tommas Pears, MD as Referring Physician (Endocrinology) Legrand Victory LITTIE MOULD, MD as Consulting Physician (Gastroenterology) Sheldon Standing, MD as Consulting Physician (General Surgery)  I have updated your Care Teams any recent Medical Services you may have received from other providers in the past year.     Assessment:   This is a routine wellness examination for Tina [name].  Hearing/Vision screen Hearing Screening - Comments:: Pt denies hearing difficulty Vision Screening - Comments:: Up to date with routine eye exams with [facility]   Goals Addressed             This Visit's Progress    Continue healthy diet   On track      Depression Screen     03/30/2024    1:14 PM 03/17/2024    9:47 AM 03/30/2023    9:10 AM 03/23/2022    9:43 AM 03/18/2021    3:55 PM 04/30/2020    3:25 PM 02/27/2019    8:11 AM  PHQ 2/9 Scores  PHQ - 2 Score 0 0 0 0 0 0 0  PHQ- 9 Score 6          Fall Risk  03/29/2024    3:40 PM 03/17/2024    9:48 AM 03/30/2023    9:07 AM 03/26/2023    4:47 PM 03/23/2022    9:42 AM  Fall Risk   Falls in the past year? 0 0 0 0 0  Number falls in past yr: 0 0 0  0  Injury with Fall? 0 0 0  0  Risk for fall due to : Impaired mobility;Orthopedic patient  No Fall Risks  No Fall Risks  Follow  up Education provided Falls evaluation completed Falls prevention discussed  Falls evaluation completed      Data saved with a previous flowsheet row definition    MEDICARE RISK AT HOME:  Medicare Risk at Home Any stairs in or around the home?: (Patient-Rptd) Yes If so, are there any without handrails?: (Patient-Rptd) No Home free of loose throw rugs in walkways, pet beds, electrical cords, etc?: (Patient-Rptd) No Adequate lighting in your home to reduce risk of falls?: (Patient-Rptd) Yes Life alert?: (Patient-Rptd) No Use of a cane, walker or w/c?: (Patient-Rptd) No Grab bars in the bathroom?: (Patient-Rptd) No Shower chair or bench in shower?: (Patient-Rptd) No Elevated toilet seat or a handicapped toilet?: (Patient-Rptd) No  TIMED UP AND GO:  Was the test performed?  No,audio  Cognitive Function: 6CIT completed        03/30/2024    1:23 PM 03/30/2023    9:11 AM 03/23/2022    9:50 AM  6CIT Screen  What Year? 0 points 0 points 0 points  What month? 0 points 0 points 0 points  What time? 0 points 0 points 0 points  Count back from 20 0 points 0 points 0 points  Months in reverse 0 points 0 points 0 points  Repeat phrase 0 points 0 points 0 points  Total Score 0 points 0 points 0 points    Immunizations Immunization History  Administered Date(s) Administered    sv, Bivalent, Protein Subunit Rsvpref,pf (Abrysvo) 03/23/2023   Fluad Quad(high Dose 65+) 03/14/2019, 03/16/2024   INFLUENZA, HIGH DOSE SEASONAL PF 02/25/2015, 04/13/2016, 02/24/2017, 02/28/2018, 03/14/2019   Influenza,inj,Quad PF,6+ Mos 06/05/2014   Influenza-Unspecified 03/30/2017, 03/22/2020, 04/02/2021, 05/05/2022, 03/23/2023   PFIZER(Purple Top)SARS-COV-2 Vaccination 08/26/2019, 09/16/2019, 10/17/2020   Pfizer Covid-19 Vaccine Bivalent Booster 21yrs & up 04/02/2021, 05/05/2022   Pneumococcal Conjugate-13 02/25/2015   Pneumococcal Polysaccharide-23 01/03/2014   Tdap 10/14/2010   Zoster  Recombinant(Shingrix) 08/23/2023, 10/26/2023   Zoster, Live 10/14/2010    Screening Tests Health Maintenance  Topic Date Due   DTaP/Tdap/Td (2 - Td or Tdap) 10/13/2020   Mammogram  12/17/2023   Colonoscopy  05/15/2024   COVID-19 Vaccine (6 - 2025-26 season) 04/02/2024 (Originally 01/31/2024)   Medicare Annual Wellness (AWV)  03/30/2025   DEXA SCAN  12/05/2025   Pneumococcal Vaccine: 50+ Years  Completed   Influenza Vaccine  Completed   Hepatitis C Screening  Completed   Zoster Vaccines- Shingrix  Completed   Meningococcal B Vaccine  Aged Out   Fecal DNA (Cologuard)  Discontinued    Health Maintenance Items Addressed: Will get tetanus at pharmacy. Will call GI to schedule colonoscopy  Additional Screening:  Vision Screening: Recommended annual ophthalmology exams for early detection of glaucoma and other disorders of the eye. Is the patient up to date with their annual eye exam?  Yes  Who is the provider or what is the name of the office in which the patient attends annual eye exams? [facility]  Dental Screening: Recommended annual dental exams for proper oral  hygiene  Community Resource Referral / Chronic Care Management: CRR required this visit?  No   CCM required this visit?  No   Plan:    I have personally reviewed and noted the following in the patient's chart:   Medical and social history Use of alcohol, tobacco or illicit drugs  Current medications and supplements including opioid prescriptions. Patient is not currently taking opioid prescriptions. Functional ability and status Nutritional status Physical activity Advanced directives List of other physicians Hospitalizations, surgeries, and ER visits in previous 12 months Vitals Screenings to include cognitive, depression, and falls Referrals and appointments  In addition, I have reviewed and discussed with patient certain preventive protocols, quality metrics, and best practice recommendations. A written  personalized care plan for preventive services as well as general preventive health recommendations were provided to patient.   Lolita Libra, CMA   03/30/2024   After Visit Summary: (MyChart) Due to this being a telephonic visit, the after visit summary with patients personalized plan was offered to patient via MyChart   Notes: Nothing significant to report at this time.

## 2024-03-31 NOTE — Telephone Encounter (Signed)
 Notified pt and she will get at her pharmacy

## 2024-04-10 ENCOUNTER — Other Ambulatory Visit: Payer: Self-pay

## 2024-04-10 DIAGNOSIS — C8294 Follicular lymphoma, unspecified, lymph nodes of axilla and upper limb: Secondary | ICD-10-CM

## 2024-04-11 ENCOUNTER — Inpatient Hospital Stay (HOSPITAL_BASED_OUTPATIENT_CLINIC_OR_DEPARTMENT_OTHER): Admitting: Hematology

## 2024-04-11 ENCOUNTER — Inpatient Hospital Stay: Attending: Hematology

## 2024-04-11 VITALS — BP 146/70 | HR 57 | Temp 97.3°F | Resp 18 | Wt 163.4 lb

## 2024-04-11 DIAGNOSIS — Z8041 Family history of malignant neoplasm of ovary: Secondary | ICD-10-CM | POA: Insufficient documentation

## 2024-04-11 DIAGNOSIS — Z9842 Cataract extraction status, left eye: Secondary | ICD-10-CM | POA: Insufficient documentation

## 2024-04-11 DIAGNOSIS — Z8261 Family history of arthritis: Secondary | ICD-10-CM | POA: Insufficient documentation

## 2024-04-11 DIAGNOSIS — Z823 Family history of stroke: Secondary | ICD-10-CM | POA: Insufficient documentation

## 2024-04-11 DIAGNOSIS — C8204 Follicular lymphoma grade I, lymph nodes of axilla and upper limb: Secondary | ICD-10-CM | POA: Insufficient documentation

## 2024-04-11 DIAGNOSIS — Z8049 Family history of malignant neoplasm of other genital organs: Secondary | ICD-10-CM | POA: Diagnosis not present

## 2024-04-11 DIAGNOSIS — C8294 Follicular lymphoma, unspecified, lymph nodes of axilla and upper limb: Secondary | ICD-10-CM

## 2024-04-11 DIAGNOSIS — I10 Essential (primary) hypertension: Secondary | ICD-10-CM | POA: Insufficient documentation

## 2024-04-11 DIAGNOSIS — Z8249 Family history of ischemic heart disease and other diseases of the circulatory system: Secondary | ICD-10-CM | POA: Insufficient documentation

## 2024-04-11 DIAGNOSIS — M810 Age-related osteoporosis without current pathological fracture: Secondary | ICD-10-CM | POA: Insufficient documentation

## 2024-04-11 DIAGNOSIS — Z808 Family history of malignant neoplasm of other organs or systems: Secondary | ICD-10-CM | POA: Insufficient documentation

## 2024-04-11 DIAGNOSIS — R5383 Other fatigue: Secondary | ICD-10-CM | POA: Diagnosis not present

## 2024-04-11 DIAGNOSIS — Z7982 Long term (current) use of aspirin: Secondary | ICD-10-CM | POA: Diagnosis not present

## 2024-04-11 DIAGNOSIS — Z87891 Personal history of nicotine dependence: Secondary | ICD-10-CM | POA: Insufficient documentation

## 2024-04-11 DIAGNOSIS — M549 Dorsalgia, unspecified: Secondary | ICD-10-CM | POA: Diagnosis not present

## 2024-04-11 DIAGNOSIS — K5900 Constipation, unspecified: Secondary | ICD-10-CM | POA: Insufficient documentation

## 2024-04-11 DIAGNOSIS — Z833 Family history of diabetes mellitus: Secondary | ICD-10-CM | POA: Diagnosis not present

## 2024-04-11 DIAGNOSIS — E785 Hyperlipidemia, unspecified: Secondary | ICD-10-CM | POA: Insufficient documentation

## 2024-04-11 DIAGNOSIS — M479 Spondylosis, unspecified: Secondary | ICD-10-CM | POA: Insufficient documentation

## 2024-04-11 DIAGNOSIS — Z59868 Other specified financial insecurity: Secondary | ICD-10-CM | POA: Insufficient documentation

## 2024-04-11 DIAGNOSIS — Z9841 Cataract extraction status, right eye: Secondary | ICD-10-CM | POA: Insufficient documentation

## 2024-04-11 DIAGNOSIS — I2510 Atherosclerotic heart disease of native coronary artery without angina pectoris: Secondary | ICD-10-CM | POA: Diagnosis not present

## 2024-04-11 DIAGNOSIS — Z79899 Other long term (current) drug therapy: Secondary | ICD-10-CM | POA: Diagnosis not present

## 2024-04-11 DIAGNOSIS — Z801 Family history of malignant neoplasm of trachea, bronchus and lung: Secondary | ICD-10-CM | POA: Diagnosis not present

## 2024-04-11 DIAGNOSIS — I252 Old myocardial infarction: Secondary | ICD-10-CM | POA: Diagnosis not present

## 2024-04-11 LAB — CBC WITH DIFFERENTIAL (CANCER CENTER ONLY)
Abs Immature Granulocytes: 0.02 K/uL (ref 0.00–0.07)
Basophils Absolute: 0 K/uL (ref 0.0–0.1)
Basophils Relative: 1 %
Eosinophils Absolute: 0.1 K/uL (ref 0.0–0.5)
Eosinophils Relative: 2 %
HCT: 40.6 % (ref 36.0–46.0)
Hemoglobin: 14.2 g/dL (ref 12.0–15.0)
Immature Granulocytes: 0 %
Lymphocytes Relative: 23 %
Lymphs Abs: 1 K/uL (ref 0.7–4.0)
MCH: 32 pg (ref 26.0–34.0)
MCHC: 35 g/dL (ref 30.0–36.0)
MCV: 91.4 fL (ref 80.0–100.0)
Monocytes Absolute: 0.4 K/uL (ref 0.1–1.0)
Monocytes Relative: 8 %
Neutro Abs: 3 K/uL (ref 1.7–7.7)
Neutrophils Relative %: 66 %
Platelet Count: 193 K/uL (ref 150–400)
RBC: 4.44 MIL/uL (ref 3.87–5.11)
RDW: 12.6 % (ref 11.5–15.5)
WBC Count: 4.5 K/uL (ref 4.0–10.5)
nRBC: 0 % (ref 0.0–0.2)

## 2024-04-11 LAB — CMP (CANCER CENTER ONLY)
ALT: 12 U/L (ref 0–44)
AST: 22 U/L (ref 15–41)
Albumin: 4.6 g/dL (ref 3.5–5.0)
Alkaline Phosphatase: 31 U/L — ABNORMAL LOW (ref 38–126)
Anion gap: 6 (ref 5–15)
BUN: 15 mg/dL (ref 8–23)
CO2: 31 mmol/L (ref 22–32)
Calcium: 10.8 mg/dL — ABNORMAL HIGH (ref 8.9–10.3)
Chloride: 103 mmol/L (ref 98–111)
Creatinine: 0.66 mg/dL (ref 0.44–1.00)
GFR, Estimated: >60 mL/min (ref >60)
Glucose, Bld: 93 mg/dL (ref 70–99)
Potassium: 4.4 mmol/L (ref 3.5–5.1)
Sodium: 140 mmol/L (ref 135–145)
Total Bilirubin: 0.6 mg/dL (ref 0.0–1.2)
Total Protein: 7.4 g/dL (ref 6.5–8.1)

## 2024-04-11 LAB — LACTATE DEHYDROGENASE: LDH: 178 U/L (ref 105–235)

## 2024-04-11 NOTE — Progress Notes (Incomplete)
 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    Recent mammogram did show findings of a persistent mass in the low left axilla with appearance of an increasingly prominent lymph node. Targeted US  showed a left axillary lymph node with diffuse, heterogenous cortical thickening to 4.5 mm. Patient had a biopsy of the left axillary lymph node, which showed atypical lymphoid infiltrates suggestive of a lymphoproliferative disorder. Excisional biopsy did show findings of low-grade follicular lymphoma.    Today, she reports that she has been doing well overall. She denies any pain from her recent biopsy on 9/17.    Patient complains of a lack of energy, fatigue, and reduced strength. Her symptoms have worsened over the last year. Activates that have become more difficult due to fatigue include mowing the lawn and using the treadmill. The severity of her fatigue fluctuates. She notes that she generally aims to take 10,000 steps in a day, but has not been able to do so over the last couple of weeks due to lack of energy.    She complains of a gradual worsening of exertional SOB when going uphill.    Patient has endorsed night sweats over the last 7  months to 1 year. Her night sweats are not drenching and she does feel warmth with her sweats. She denies any unexplained fever or chills.   Patient endorses mild constipation, which is a new symptom beginning a couple months ago. She notes having rectosigmoid resection surgery 3 years ago for large benign polyps.    Patient complains of pain in her groin on one side when bending down, present for 1 month.    Her osteoporosis is managed by Fosamax , which she has been taking for over two years and potentially over 5 years. She notes that there were considerations of Teriparatide previously, which she is not taking at this time. Patient notes that she has fractured her knees previously. Her last bone density study was two years ago. Patient takes a calcium  supplement which has a vitamin D  component. She does not generally consume dairy in her diet.    Patient denies any leg/hand swelling and denies any axillary lymphedema. She denies any abdominal pain/distention, back pain or other new lumps/bumps. Patient does note having lumbar issues. She denies any other notable new changes in the last 6-12 months. Her coronary artery disease has been stable and she denies any other recent medical issues that have been a limiting factor.    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   SUMMARY OF ONCOLOGIC HISTORY: Oncology History   No history exists.   Cycle *** / Day *** of [No matching plan found]  INTERVAL HISTORY: Tina [name] is a 78 y.o. 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 {ELcompanions/ambulations (Optional):33762}  she was last seen  by me on 10/05/2023; at the time she notes no new lumps or bumps.  No fevers no chills no night sweats no unexpected weight loss.  {Concerns:31667}  Today, she is doing well. She reports feeling fatigued, but this has not improved or worsened in the past six months.  She has added 3000mg  of  D3 Vitamin and continues to take her calcium  supplements.   She denies any change in symptoms. She reports constipation when she consumes dairy.   She reports back pain due to spinal spondylosis.   Ms. 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   Denies any  []  new infection issues,  []  fevers/chills,  []  drenching night sweats,  []  unexpected weight change,  [x]  back pain,  []  chest pain,  [x]  abdominal pain,  [x]  new bone pains,  []  new lumps/bumps,  [x]  leg swelling,  []  bleeding issues (nose bleeds, gum bleeds, abnormal/spontaneous bruising),  []  nausea/vomiting,  []  diarrhea,  [x]  bowel/urinary changes, []  SOB,  []  change in breathing        REVIEW OF SYSTEMS:   10 Point review of systems of done and is negative except as noted above.  MEDICAL HISTORY Past Medical History:  Diagnosis Date   Allergy    seasonal   Cataract    removed both eyes   Fatty liver    Goiter    treated   HTN (hypertension)    Hyperlipidemia    Myocardial infarct (HCC) hx of 2004   NSTEMI with stent   Osteoporosis    Patellar fracture    Sleep apnea    no cpap   Wrist fracture    right wrist    SURGICAL HISTORY Past Surgical History:  Procedure Laterality Date   BREAST BIOPSY Left 02/15/2023   US  LT RADIOACTIVE SEED LOC 02/15/2023 [facility]   CARDIAC CATHETERIZATION  2004   stent   CATARACT EXTRACTION, BILATERAL  05/2020   COLONOSCOPY     POLYPECTOMY     PROCTOSCOPY N/A 07/12/2019   Procedure: RIGID PROCTOSCOPY;  Surgeon: Sheldon Standing, MD;  Location: [facility];  Service: General;  Laterality: N/A;   RADIOACTIVE SEED GUIDED AXILLARY SENTINEL LYMPH NODE Left 02/16/2023   Procedure: RADIOACTIVE SEED GUIDED EXCISIONAL BIOPSY LEFT AXILLARY LYMPH NODE;  Surgeon: Vernetta Berg, MD;  Location: [facility];  Service: General;  Laterality: Left;   sleep study  2012     SOCIAL HISTORY Social History   Tobacco Use   Smoking status: Former    Current packs/day: 0.00    Average packs/day: 1 pack/day for 30.0 years (30.0 ttl pk-yrs)    Types: Cigarettes    Start date: 12/12/1972    Quit date: 12/13/2002    Years since quitting: 21.3   Smokeless tobacco: Former    Quit date: 06/01/2002  Vaping Use   Vaping status: Never Used  Substance Use Topics   Alcohol use: Yes    Comment: 1-2 glasses of wine with dinner   Drug use: No    Social History   Social History Narrative   Occupation:[employer]   Divorced   Former Smoker quit 2004   Drug use-no   Regular exercise-treadmill 3x a week   Originially from [place]     SOCIAL DRIVERS OF HEALTH SDOH Screenings   Food Insecurity: No Food Insecurity (03/30/2024)  Housing: Low Risk  (03/30/2024)  Transportation Needs: No Transportation Needs (  03/30/2024)  Utilities: Not At Risk (03/30/2024)  Alcohol Screen: Low Risk  (03/30/2024)  Depression (PHQ2-9): Medium Risk (03/30/2024)  Financial Resource Strain: Medium Risk (03/30/2024)  Physical Activity: Sufficiently Active (03/30/2024)  Social Connections: Socially Isolated (03/30/2024)  Stress: Stress Concern Present (03/30/2024)  Tobacco Use: Medium Risk (03/30/2024)  Health Literacy: Adequate Health Literacy (03/30/2024)     FAMILY HISTORY Family History  Problem Relation Age of Onset   Cancer Mother 72       ovarian   Hypertension Mother    Heart disease Mother 64       MI--- died from #3 at 74   Ovarian cancer Mother    Hypertension Father    Heart disease Father    Stroke Sister    Cancer Sister        uterine   Hypertension Sister    Cancer Sister        brain   Hypertension Brother    Arthritis Brother    Diabetes Maternal Grandmother    Coronary artery disease Other    Lung cancer Other    Ovarian cancer Other    Colon cancer Neg Hx    Colon polyps Neg Hx    Esophageal cancer Neg Hx    Stomach cancer Neg Hx     Rectal cancer Neg Hx      ALLERGIES: has no known allergies.  MEDICATIONS  Current Outpatient Medications  Medication Sig Dispense Refill   aspirin  81 MG tablet Take 81 mg by mouth daily.     Calcium  Carbonate-Vit D-Min (CALCIUM  1200 PO) Take 1,200 mg by mouth daily. 1 tab po qd     Cholecalciferol (VITAMIN D3) 25 MCG (1000 UT) CAPS 3 po every day     Multiple Vitamin (MULTIVITAMIN) capsule Take 1 capsule by mouth daily.     Omega-3 Fatty Acids (FISH OIL) 1000 MG CAPS Take 2,000 mg by mouth daily.     ramipril  (ALTACE ) 5 MG capsule Take 1 capsule (5 mg total) by mouth daily. Needs appt 90 capsule 3   rosuvastatin  (CRESTOR ) 20 MG tablet TAKE 1 TABLET(20 MG) BY MOUTH DAILY 90 tablet 3   Current Facility-Administered Medications  Medication Dose Route Frequency Provider Last Rate Last Admin   denosumab  (PROLIA ) injection 60 mg  60 mg Subcutaneous Once Lowne Chase, Jamee SAUNDERS, DO        PHYSICAL EXAMINATION: ECOG PERFORMANCE STATUS: {CHL ONC ECOG ED:[id number]} VITALS: Vitals:   04/11/24 1330 04/11/24 1338  BP: (!) 156/64 (!) 146/70  Pulse: (!) 57   Resp: 18   Temp: (!) 97.3 F (36.3 C)   SpO2: 97%    Filed Weights   04/11/24 1330  Weight: 163 lb 6.4 oz (74.1 kg)   Body mass index is 27.19 kg/m.  GENERAL: alert, in no acute distress and comfortable SKIN: no acute rashes, no significant lesions EYES: conjunctiva are pink and non-injected, sclera anicteric OROPHARYNX: MMM, no exudates, no oropharyngeal erythema or ulceration NECK: supple, no JVD LYMPH:  no palpable lymphadenopathy in the cervical, axillary or inguinal regions LUNGS: clear to auscultation b/l with normal respiratory effort HEART: regular rate & rhythm ABDOMEN:  normoactive bowel sounds , non tender, not distended, no hepatosplenomegaly Extremity: no pedal edema PSYCH: alert & oriented x 3 with fluent speech NEURO: no focal motor/sensory deficits  LABORATORY DATA:   I have reviewed the data as  listed     Latest Ref Rng & Units 04/11/2024   12:41 PM 03/17/2024   10:37  AM 10/05/2023    9:51 AM  CBC EXTENDED  WBC 4.0 - 10.5 K/uL 4.5  4.2  5.5   RBC 3.87 - 5.11 MIL/uL 4.44  4.36  4.25   Hemoglobin 12.0 - 15.0 g/dL 85.7  86.2  86.5   HCT 36.0 - 46.0 % 40.6  40.3  38.2   Platelets 150 - 400 K/uL 193  208.0  219   NEUT# 1.7 - 7.7 K/uL 3.0  2.9  3.7   Lymph# 0.7 - 4.0 K/uL 1.0  0.9  1.3     {ELAddOncLabs (Optional):33736}     Latest Ref Rng & Units 04/11/2024   12:41 PM 03/17/2024   10:37 AM 10/05/2023    9:51 AM  CMP  Glucose 70 - 99 mg/dL 93  85  94   BUN 8 - 23 mg/dL 15  13  15    Creatinine 0.44 - 1.00 mg/dL 9.33  9.36  9.27   Sodium 135 - 145 mmol/L 140  137  142   Potassium 3.5 - 5.1 mmol/L 4.4  4.2  4.2   Chloride 98 - 111 mmol/L 103  99  104   CO2 22 - 32 mmol/L 31  30  32   Calcium  8.9 - 10.3 mg/dL 89.1  9.4  89.6   Total Protein 6.5 - 8.1 g/dL 7.4  7.1  7.4   Total Bilirubin 0.0 - 1.2 mg/dL 0.6  0.8  0.6   Alkaline Phos 38 - 126 U/L 31  31  38   AST 15 - 41 U/L 22  25  20    ALT 0 - 44 U/L 12  16  14       RADIOGRAPHIC STUDIES: I have personally reviewed the radiological images as listed and agreed with the findings in the report. {ELImaging:31163}  ASSESSMENT & PLAN:  78 y.o. female with    PLAN: - Discussed lab results on 04/11/2024 in detail with patient: CBC showed {ELGKCBC:33763} CMP with Creatinine *** {ELIncreased/Decreased:33631} from *** and Calcium  *** {ELIncreased/Decreased:33631} from ***.  {ELGKMyeloma&Lightchains (Optional):33764}  -Blood counts are normal -Protein levels and calcium  levels are normal, albumin -Blood chemistry is stable  -Discussed that is was reasonable to have mammograms once a year but only for breast cancer screenings, not for lymphoma.  -Instructed to continue calcium  supplements and vitamin D3 -Follow-up with Dr. Onesimo for visit and labs in 6 months  FOLLOW-UP in {WEEKS/MONTHS:30939} 6 months for labs and  follow-up with Dr. Onesimo.  The total time spent in the appointment was *** minutes* .  All of the patient's questions were answered and the patient knows to call the clinic with any problems, questions, or concerns.  Emaline Onesimo MD MS AAHIVMS [facility] Hematology/Oncology Physician [facility]  *Total Encounter Time as defined by the Centers for Medicare and Medicaid Services includes, in addition to the face-to-face time of a patient visit (documented in the note above) non-face-to-face time: obtaining and reviewing outside history, ordering and reviewing medications, tests or procedures, care coordination (communications with other health care professionals or caregivers) and documentation in the medical record.  I,Amanda Scholz,acting as a neurosurgeon for Emaline Onesimo, MD.,have documented all relevant documentation on the behalf of Emaline Onesimo, MD,as directed by  Emaline Onesimo, MD while in the presence of Emaline Onesimo, MD.  I have reviewed the above documentation for accuracy and completeness, and I agree with the above.  Gautam Kale, MD

## 2024-04-14 NOTE — Progress Notes (Signed)
 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  HISTORY OF PRESENTING ILLNESS: Tina [name] is a wonderful 78 y.o. 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    Recent mammogram did show findings of a persistent mass in the low left axilla with appearance of an increasingly prominent lymph node. Targeted US  showed a left axillary lymph node with diffuse, heterogenous cortical thickening to 4.5 mm. Patient had a biopsy of the left axillary lymph node, which showed atypical lymphoid infiltrates suggestive of a lymphoproliferative disorder. Excisional biopsy did show findings of low-grade follicular lymphoma.    Today, she reports that she has been doing well overall. She denies any pain from her recent biopsy on 9/17.    Patient complains of a lack of energy, fatigue, and reduced strength. Her symptoms have worsened over the last year. Activates that have become more difficult due to fatigue include mowing the lawn and using the treadmill. The severity of her fatigue fluctuates. She notes that she generally aims to take 10,000 steps in a day, but has not been able to do so over the last couple of weeks due to lack of energy.    She complains of a gradual worsening of exertional SOB when going uphill.    Patient has endorsed night sweats over the last 7  months to 1 year. Her night sweats are not drenching and she does feel warmth with her sweats. She denies any unexplained fever or chills.   Patient endorses mild constipation, which is a new symptom beginning a couple months ago. She notes having rectosigmoid resection surgery 3 years ago for large benign polyps.    Patient complains of pain in her groin on one side when bending down, present for 1 month.    Her osteoporosis is managed by Fosamax , which she has been taking for over two years and potentially over 5 years. She notes that there were considerations of Teriparatide previously, which she is not taking at this time. Patient notes that she has fractured her knees previously. Her last bone density study was two years ago. Patient takes a calcium  supplement which has a vitamin D  component. She does not generally consume dairy in her diet.    Patient denies any leg/hand swelling and denies any axillary lymphedema. She denies any abdominal pain/distention, back pain or other new lumps/bumps. Patient does note having lumbar issues. She denies any other notable new changes in the last 6-12 months. Her coronary artery disease has been stable and she denies any other recent medical issues that have been a limiting factor.    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  INTERVAL HISTORY: Tina [name] is a 78 y.o. 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   she was last seen by me on 10/05/2023; at the time she notes no new lumps or bumps.  No fevers no chills no night sweats no unexpected weight loss.  Today, she is doing well. She reports feeling fatigued, but this has not improved or worsened in the past six months.  She has added 3000mg  of D3 Vitamin and continues to take her calcium  supplements.   She denies any change in symptoms. She reports constipation when she consumes dairy.   She reports back pain due to spinal  spondylosis.   Ms. Helin inquired about the frequency of her mammograms due to the axillary lymphoma that was revealed during one of her past mammograms.   Denies any back pain, abdominal pain, new bone pains, leg swelling, or bowel/urinary changes.  REVIEW OF SYSTEMS:   10 Point review of systems of done and is negative except as noted above.  MEDICAL HISTORY Past Medical History:  Diagnosis Date   Allergy    seasonal   Cataract    removed both eyes   Fatty liver    Goiter    treated   HTN (hypertension)    Hyperlipidemia    Myocardial infarct (HCC) hx of 2004   NSTEMI with stent   Osteoporosis    Patellar fracture    Sleep apnea    no cpap   Wrist fracture    right wrist    SURGICAL HISTORY Past Surgical History:  Procedure Laterality Date   BREAST BIOPSY Left 02/15/2023   US  LT RADIOACTIVE SEED LOC 02/15/2023 [facility]   CARDIAC CATHETERIZATION  2004   stent   CATARACT EXTRACTION, BILATERAL  05/2020   COLONOSCOPY     POLYPECTOMY     PROCTOSCOPY N/A 07/12/2019   Procedure: RIGID PROCTOSCOPY;  Surgeon: Sheldon Standing, MD;  Location: [facility];  Service: General;  Laterality: N/A;   RADIOACTIVE SEED GUIDED AXILLARY SENTINEL LYMPH NODE Left 02/16/2023   Procedure: RADIOACTIVE SEED GUIDED EXCISIONAL BIOPSY LEFT AXILLARY LYMPH NODE;  Surgeon: Vernetta Berg, MD;  Location: [facility];  Service: General;  Laterality: Left;   sleep study  2012    SOCIAL HISTORY Social History   Tobacco Use   Smoking status: Former    Current packs/day: 0.00    Average packs/day: 1 pack/day for 30.0 years (30.0 ttl pk-yrs)    Types: Cigarettes    Start date: 12/12/1972    Quit date: 12/13/2002    Years since quitting: 21.3   Smokeless tobacco: Former    Quit date: 06/01/2002  Vaping Use   Vaping status: Never Used  Substance Use Topics   Alcohol use: Yes    Comment: 1-2 glasses of wine with dinner   Drug use: No    Social History   Social History  Narrative   Occupation:[employer]   Divorced   Former Smoker quit 2004   Drug use-no   Regular exercise-treadmill 3x a week   Originially from [employer]     SOCIAL DRIVERS OF HEALTH SDOH Screenings   Food Insecurity: No Food Insecurity (03/30/2024)  Housing: Low Risk  (03/30/2024)  Transportation Needs: No Transportation Needs (03/30/2024)  Utilities: Not At Risk (03/30/2024)  Alcohol Screen: Low Risk  (03/30/2024)  Depression (PHQ2-9): Medium Risk (03/30/2024)  Financial Resource Strain: Medium Risk (03/30/2024)  Physical Activity: Sufficiently Active (03/30/2024)  Social Connections: Socially Isolated (03/30/2024)  Stress: Stress Concern Present (03/30/2024)  Tobacco Use: Medium Risk (03/30/2024)  Health Literacy: Adequate Health Literacy (03/30/2024)     FAMILY HISTORY Family History  Problem Relation Age of Onset   Cancer Mother 30       ovarian   Hypertension Mother    Heart disease  Mother 23       MI--- died from #3 at 4   Ovarian cancer Mother    Hypertension Father    Heart disease Father    Stroke Sister    Cancer Sister        uterine   Hypertension Sister    Cancer Sister        brain   Hypertension Brother    Arthritis Brother    Diabetes Maternal Grandmother    Coronary artery disease Other    Lung cancer Other    Ovarian cancer Other    Colon cancer Neg Hx    Colon polyps Neg Hx    Esophageal cancer Neg Hx    Stomach cancer Neg Hx    Rectal cancer Neg Hx      ALLERGIES: has no known allergies.  MEDICATIONS  Current Outpatient Medications  Medication Sig Dispense Refill   aspirin  81 MG tablet Take 81 mg by mouth daily.     Calcium  Carbonate-Vit D-Min (CALCIUM  1200 PO) Take 1,200 mg by mouth daily. 1 tab po qd     Cholecalciferol (VITAMIN D3) 25 MCG (1000 UT) CAPS 3 po every day     Multiple Vitamin (MULTIVITAMIN) capsule Take 1 capsule by mouth daily.     Omega-3 Fatty Acids (FISH OIL) 1000 MG CAPS Take 2,000 mg by mouth daily.      ramipril  (ALTACE ) 5 MG capsule Take 1 capsule (5 mg total) by mouth daily. Needs appt 90 capsule 3   rosuvastatin  (CRESTOR ) 20 MG tablet TAKE 1 TABLET(20 MG) BY MOUTH DAILY 90 tablet 3   Current Facility-Administered Medications  Medication Dose Route Frequency Provider Last Rate Last Admin   denosumab  (PROLIA ) injection 60 mg  60 mg Subcutaneous Once Lowne Chase, Yvonne R, DO        PHYSICAL EXAMINATION: ECOG PERFORMANCE STATUS: 1 - Symptomatic but completely ambulatory VITALS: Vitals:   04/11/24 1330 04/11/24 1338  BP: (!) 156/64 (!) 146/70  Pulse: (!) 57   Resp: 18   Temp: (!) 97.3 F (36.3 C)   SpO2: 97%    Filed Weights   04/11/24 1330  Weight: 163 lb 6.4 oz (74.1 kg)   Body mass index is 27.19 kg/m.  GENERAL: alert, in no acute distress and comfortable SKIN: no acute rashes, no significant lesions EYES: conjunctiva are pink and non-injected, sclera anicteric OROPHARYNX: MMM, no exudates, no oropharyngeal erythema or ulceration NECK: supple, no JVD LYMPH:  no palpable lymphadenopathy in the cervical, axillary or inguinal regions LUNGS: clear to auscultation b/l with normal respiratory effort HEART: regular rate & rhythm ABDOMEN:  normoactive bowel sounds , non tender, not distended, no hepatosplenomegaly Extremity: no pedal edema PSYCH: alert & oriented x 3 with fluent speech NEURO: no focal motor/sensory deficits  LABORATORY DATA:   I have reviewed the data as listed     Latest Ref Rng & Units 04/11/2024   12:41 PM 03/17/2024   10:37 AM 10/05/2023    9:51 AM  CBC EXTENDED  WBC 4.0 - 10.5 K/uL 4.5  4.2  5.5   RBC 3.87 - 5.11 MIL/uL 4.44  4.36  4.25   Hemoglobin 12.0 - 15.0 g/dL 85.7  86.2  86.5   HCT 36.0 - 46.0 % 40.6  40.3  38.2   Platelets 150 - 400 K/uL 193  208.0  219   NEUT# 1.7 - 7.7 K/uL 3.0  2.9  3.7   Lymph# 0.7 - 4.0 K/uL 1.0  0.9  1.3     LDH:  Lab Results  Component Value Date   LDH 178 04/11/2024        Latest Ref Rng & Units  04/11/2024   12:41 PM 03/17/2024   10:37 AM 10/05/2023    9:51 AM  CMP  Glucose 70 - 99 mg/dL 93  85  94   BUN 8 - 23 mg/dL 15  13  15    Creatinine 0.44 - 1.00 mg/dL 9.33  9.36  9.27   Sodium 135 - 145 mmol/L 140  137  142   Potassium 3.5 - 5.1 mmol/L 4.4  4.2  4.2   Chloride 98 - 111 mmol/L 103  99  104   CO2 22 - 32 mmol/L 31  30  32   Calcium  8.9 - 10.3 mg/dL 89.1  9.4  89.6   Total Protein 6.5 - 8.1 g/dL 7.4  7.1  7.4   Total Bilirubin 0.0 - 1.2 mg/dL 0.6  0.8  0.6   Alkaline Phos 38 - 126 U/L 31  31  38   AST 15 - 41 U/L 22  25  20    ALT 0 - 44 U/L 12  16  14     02/16/2023 LYMPH NODE, LEFT AXILLARY, EXCISION:    RADIOGRAPHIC STUDIES: I have personally reviewed the radiological images as listed and agreed with the findings in the report. No results found.   ASSESSMENT & PLAN:  78 y.o. female with  Follicular low-grade B-cell lymphoma   PLAN: - Discussed lab results on 04/11/2024 in detail with patient: -CBC normal -Protein levels and calcium  levels are normal; CMET stable  -Discussed that is was reasonable to have mammograms once a year but only for breast cancer screenings, not for lymphoma.  -Instructed to continue calcium  supplements and vitamin D3 -Follow-up with Dr. Onesimo for visit and labs in 6 months  FOLLOW-UP in 6 months for labs and follow-up with Dr. Onesimo.  The total time spent in the appointment was *** minutes* .  All of the patient's questions were answered and the patient knows to call the clinic with any problems, questions, or concerns.  Emaline Onesimo MD MS AAHIVMS [facility] Hematology/Oncology Physician [facility]  *Total Encounter Time as defined by the Centers for Medicare and Medicaid Services includes, in addition to the face-to-face time of a patient visit (documented in the note above) non-face-to-face time: obtaining and reviewing outside history, ordering and reviewing medications, tests or procedures, care coordination  (communications with other health care professionals or caregivers) and documentation in the medical record.  I, Damien Blanks, acting as a neurosurgeon for Emaline Onesimo, MD.,have documented all relevant documentation on the behalf of Emaline Onesimo, MD,as directed by  Emaline Onesimo, MD while in the presence of Emaline Onesimo, MD.  I have reviewed the above documentation for accuracy and completeness, and I agree with the above.  Damarcus Reggio, MD

## 2024-06-07 ENCOUNTER — Ambulatory Visit: Admitting: Gastroenterology

## 2024-06-07 ENCOUNTER — Encounter: Payer: Self-pay | Admitting: Gastroenterology

## 2024-06-07 VITALS — BP 160/78 | HR 96 | Ht 65.0 in | Wt 170.2 lb

## 2024-06-07 DIAGNOSIS — Z860100 Personal history of colon polyps, unspecified: Secondary | ICD-10-CM

## 2024-06-07 DIAGNOSIS — K5909 Other constipation: Secondary | ICD-10-CM

## 2024-06-07 MED ORDER — NA SULFATE-K SULFATE-MG SULF 17.5-3.13-1.6 GM/177ML PO SOLN: 1.0000 | ORAL | 0 refills | Status: DC

## 2024-06-07 NOTE — Patient Instructions (Addendum)
 _______________________________________________________  If your blood pressure at your visit was 140/90 or greater, please contact your primary care physician to follow up on this.  _______________________________________________________  If you are age 78 or older, your body mass index should be between 23-30. Your Body mass index is 28.33 kg/m. If this is out of the aforementioned range listed, please consider follow up with your Primary Care Provider.  If you are age 39 or younger, your body mass index should be between 19-25. Your Body mass index is 28.33 kg/m. If this is out of the aformentioned range listed, please consider follow up with your Primary Care Provider.   ________________________________________________________  The  GI providers would like to encourage you to use MYCHART to communicate with providers for non-urgent requests or questions.  Due to long hold times on the telephone, sending your provider a message by [facility] may be a faster and more efficient way to get a response.  Please allow 48 business hours for a response.  Please remember that this is for non-urgent requests.  _______________________________________________________  [facility] is using a team-based approach to care.  Your team is made up of your doctor and two to three APPS. Our APPS (Nurse Practitioners and Physician Assistants) work with your physician to ensure care continuity for you. They are fully qualified to address your health concerns and develop a treatment plan. They communicate directly with your gastroenterologist to care for you. Seeing the Advanced Practice Practitioners on your physician's team can help you by facilitating care more promptly, often allowing for earlier appointments, access to diagnostic testing, procedures, and other specialty referrals.   You have been scheduled for a colonoscopy. Please follow written instructions given to you at your visit today.    If you use inhalers (even only as needed), please bring them with you on the day of your procedure.  DO NOT TAKE 7 DAYS PRIOR TO TEST- Trulicity (dulaglutide) Ozempic, Wegovy (semaglutide) Mounjaro, Zepbound (tirzepatide) Bydureon Bcise (exanatide extended release)  DO NOT TAKE 1 DAY PRIOR TO YOUR TEST Rybelsus (semaglutide) Adlyxin (lixisenatide) Victoza (liraglutide) Byetta (exanatide) ___________________________________________________________________________   Due to recent changes in healthcare laws, you may see the results of your imaging and laboratory studies on MyChart before your provider has had a chance to review them.  We understand that in some cases there may be results that are confusing or concerning to you. Not all laboratory results come back in the same time frame and the provider may be waiting for multiple results in order to interpret others.  Please give us  48 hours in order for your provider to thoroughly review all the results before contacting the office for clarification of your results.   It was a pleasure to see you today!  Thank you for trusting me with your gastrointestinal care!

## 2024-06-07 NOTE — Progress Notes (Signed)
 "     [facility] Consult Note:  History: Tina [name] 06/07/2024  Referring provider: Antonio Meth, Jamee SAUNDERS, DO  Reason for consult/chief complaint: Colonoscopy (Pt states its been 52yrs since last colon, last colon 2022 hx of colon polyps, pt states no GI symptoms at this moment more constipated than usual )   Subjective  Prior history:  Large rectal villous adenoma requiring surgery February 2021 Colonoscopy December 2022 -5 mm ascending colon tubular adenoma, healthy-appearing colorectal anastomosis.  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  Under observation, last clinic visit with Dr. Onesimo May 2025  History of Present Illness  Tina [name] reports that she generally feels well from a digestive standpoint.  In the last couple of years she has had slow development of constipation that is generally managed with dietary fiber or glycerin suppository.  Occurring perhaps somewhat more often in the last 6 to 9 months.  Denies rectal bleeding.  Appetite good weight stable.   ROS:  Review of Systems Denies chest pain dyspnea or dysuria Fatigue Arthralgias  Past Medical History: Past Medical History:  Diagnosis Date   Allergy    seasonal   Cataract    removed both eyes   Fatty liver    Goiter    treated   HTN (hypertension)    Hyperlipidemia    Lymphoma (HCC)    Myocardial infarct (HCC) hx of 2004   NSTEMI with stent   Osteoporosis    Patellar fracture    Sleep apnea    no cpap   Wrist fracture    right wrist     Past Surgical History: Past Surgical History:  Procedure Laterality Date   BREAST BIOPSY Left 02/15/2023   US  LT RADIOACTIVE SEED LOC 02/15/2023 [facility]   CARDIAC CATHETERIZATION  2004   stent   CATARACT EXTRACTION, BILATERAL  05/2020   COLONOSCOPY     POLYPECTOMY     PROCTOSCOPY N/A 07/12/2019   Procedure: RIGID PROCTOSCOPY;  Surgeon: Sheldon Standing,  MD;  Location: [facility];  Service: General;  Laterality: N/A;   RADIOACTIVE SEED GUIDED AXILLARY SENTINEL LYMPH NODE Left 02/16/2023   Procedure: RADIOACTIVE SEED GUIDED EXCISIONAL BIOPSY LEFT AXILLARY LYMPH NODE;  Surgeon: Vernetta Berg, MD;  Location: [facility];  Service: General;  Laterality: Left;   sleep study  2012     Family History: Family History  Problem Relation Age of Onset   Cancer Mother 9       ovarian   Hypertension Mother    Heart disease Mother 64       MI--- died from #3 at 78   Ovarian cancer Mother    Hypertension Father    Heart disease Father    Stroke Sister    Cancer Sister        uterine   Hypertension Sister    Cancer Sister        brain   Hypertension Brother    Arthritis Brother    Diabetes Maternal Grandmother    Coronary artery disease Other    Lung cancer Other    Ovarian cancer Other    Colon cancer Neg Hx    Colon polyps Neg Hx    Esophageal cancer Neg Hx    Stomach cancer Neg Hx    Rectal cancer Neg Hx     Social History: Social History   Socioeconomic History   Marital status: Divorced    Spouse name:  Not on file   Number of children: Not on file   Years of education: Not on file   Highest education level: Associate degree: occupational, scientist, product/process development, or vocational program  Occupational History   Occupation: nurse    Employer: OTHER    Comment: [employer]   Tobacco Use   Smoking status: Former    Current packs/day: 0.00    Average packs/day: 1 pack/day for 30.0 years (30.0 ttl pk-yrs)    Types: Cigarettes    Start date: 12/12/1972    Quit date: 12/13/2002    Years since quitting: 21.4   Smokeless tobacco: Former    Quit date: 06/01/2002  Vaping Use   Vaping status: Never Used  Substance and Sexual Activity   Alcohol use: Yes    Comment: 1-2 glasses of wine with dinner   Drug use: No   Sexual activity: Not Currently    Partners: Male  Other Topics Concern   Not on file  Social History Narrative    Occupation:[employer]   Divorced   Former Smoker quit 2004   Drug use-no   Regular exercise-treadmill 3x a week   Originially from [place]    Social Drivers of Health   Tobacco Use: Medium Risk (03/30/2024)   Patient History    Smoking Tobacco Use: Former    Smokeless Tobacco Use: Former    Passive Exposure: Not on Actuary Strain: Medium Risk (03/30/2024)   Overall Financial Resource Strain (CARDIA)    Difficulty of Paying Living Expenses: Somewhat hard  Food Insecurity: No Food Insecurity (03/30/2024)   Epic    Worried About Programme Researcher, Broadcasting/film/video in the Last Year: Never true    Ran Out of Food in the Last Year: Never true  Transportation Needs: No Transportation Needs (03/30/2024)   Epic    Lack of Transportation (Medical): No    Lack of Transportation (Non-Medical): No  Physical Activity: Sufficiently Active (03/30/2024)   Exercise Vital Sign    Days of Exercise per Week: 5 days    Minutes of Exercise per Session: 60 min  Stress: Stress Concern Present (03/30/2024)   [employer] of Occupational Health - Occupational Stress Questionnaire    Feeling of Stress: To some extent  Social Connections: Socially Isolated (03/30/2024)   Social Connection and Isolation Panel    Frequency of Communication with Friends and Family: Three times a week    Frequency of Social Gatherings with Friends and Family: Once a week    Attends Religious Services: Never    Database Administrator or Organizations: No    Attends Banker Meetings: Never    Marital Status: Divorced  Depression (PHQ2-9): Medium Risk (03/30/2024)   Depression (PHQ2-9)    PHQ-2 Score: 6  Alcohol Screen: Low Risk (03/30/2024)   Alcohol Screen    Last Alcohol Screening Score (AUDIT): 4  Housing: Low Risk (03/30/2024)   Epic    Unable to Pay for Housing in the Last Year: No    Number of Times Moved in the Last Year: 0    Homeless in the Last Year: No  Utilities: Not At Risk  (03/30/2024)   Epic    Threatened with loss of utilities: No  Health Literacy: Adequate Health Literacy (03/30/2024)   B1300 Health Literacy    Frequency of need for help with medical instructions: Never    Allergies: Allergies[1]  Outpatient Meds: Current Outpatient Medications  Medication Sig Dispense Refill   aspirin  81 MG tablet Take  81 mg by mouth daily.     Calcium  Carbonate-Vit D-Min (CALCIUM  1200 PO) Take 1,200 mg by mouth daily. 1 tab po qd     Cholecalciferol (VITAMIN D3) 25 MCG (1000 UT) CAPS 3 po every day     Multiple Vitamin (MULTIVITAMIN) capsule Take 1 capsule by mouth daily.     Na Sulfate-K Sulfate-Mg Sulfate concentrate (SUPREP BOWEL PREP KIT) 17.5-3.13-1.6 GM/177ML SOLN Take 1 kit (354 mLs total) by mouth as directed. 324 mL 0   Omega-3 Fatty Acids (FISH OIL) 1000 MG CAPS Take 2,000 mg by mouth daily.     ramipril  (ALTACE ) 5 MG capsule Take 1 capsule (5 mg total) by mouth daily. Needs appt 90 capsule 3   rosuvastatin  (CRESTOR ) 20 MG tablet TAKE 1 TABLET(20 MG) BY MOUTH DAILY 90 tablet 3   Current Facility-Administered Medications  Medication Dose Route Frequency Provider Last Rate Last Admin   denosumab  (PROLIA ) injection 60 mg  60 mg Subcutaneous Once Lowne Chase, Yvonne R, DO          ___________________________________________________________________ Objective   Exam:  BP (!) 160/78   Pulse 96   Ht 5' 5 (1.651 m)   Wt 170 lb 4 oz (77.2 kg)   BMI 28.33 kg/m  Wt Readings from Last 3 Encounters:  06/07/24 170 lb 4 oz (77.2 kg)  04/11/24 163 lb 6.4 oz (74.1 kg)  03/30/24 166 lb (75.3 kg)    General: Well-appearing Eyes: sclera anicteric, no redness ENT: oral mucosa moist without lesions, no cervical or supraclavicular lymphadenopathy CV: Regular without appreciable murmur, no JVD, bilateral pretibial edema, left greater than right Resp: clear to auscultation bilaterally, normal RR and effort noted GI: soft, no focal tenderness, with active bowel  sounds. No guarding or palpable organomegaly noted. Skin; warm and dry, no rash or jaundice noted Neuro: awake, alert and oriented x 3. Normal gross motor function and fluent speech   Labs:     Latest Ref Rng & Units 04/11/2024   12:41 PM 03/17/2024   10:37 AM 10/05/2023    9:51 AM  CBC  WBC 4.0 - 10.5 K/uL 4.5  4.2  5.5   Hemoglobin 12.0 - 15.0 g/dL 85.7  86.2  86.5   Hematocrit 36.0 - 46.0 % 40.6  40.3  38.2   Platelets 150 - 400 K/uL 193  208.0  219       Latest Ref Rng & Units 04/11/2024   12:41 PM 03/17/2024   10:37 AM 10/05/2023    9:51 AM  CMP  Glucose 70 - 99 mg/dL 93  85  94   BUN 8 - 23 mg/dL 15  13  15    Creatinine 0.44 - 1.00 mg/dL 9.33  9.36  9.27   Sodium 135 - 145 mmol/L 140  137  142   Potassium 3.5 - 5.1 mmol/L 4.4  4.2  4.2   Chloride 98 - 111 mmol/L 103  99  104   CO2 22 - 32 mmol/L 31  30  32   Calcium  8.9 - 10.3 mg/dL 89.1  9.4  89.6   Total Protein 6.5 - 8.1 g/dL 7.4  7.1  7.4   Total Bilirubin 0.0 - 1.2 mg/dL 0.6  0.8  0.6   Alkaline Phos 38 - 126 U/L 31  31  38   AST 15 - 41 U/L 22  25  20    ALT 0 - 44 U/L 12  16  14       Encounter Diagnoses  Name Primary?   History of colonic polyps Yes   Chronic constipation    Constipation generally well-managed Due for surveillance colonoscopy Assessment & Plan   Colonoscopy scheduled.  She was agreeable after discussion of procedure and risks.  If low risk findings, may not need any future surveillance.  (Which she was glad to hear)    Tina [name]  CC: Referring provider noted above     [1] No Known Allergies  "

## 2024-06-29 ENCOUNTER — Encounter: Payer: Self-pay | Admitting: Gastroenterology

## 2024-06-29 ENCOUNTER — Ambulatory Visit: Admitting: Gastroenterology

## 2024-06-29 VITALS — BP 148/62 | HR 73 | Temp 97.3°F | Resp 11 | Ht 65.0 in | Wt 170.0 lb

## 2024-06-29 DIAGNOSIS — K648 Other hemorrhoids: Secondary | ICD-10-CM

## 2024-06-29 DIAGNOSIS — Z860101 Personal history of adenomatous and serrated colon polyps: Secondary | ICD-10-CM | POA: Diagnosis not present

## 2024-06-29 DIAGNOSIS — Z860100 Personal history of colon polyps, unspecified: Secondary | ICD-10-CM

## 2024-06-29 DIAGNOSIS — Z1211 Encounter for screening for malignant neoplasm of colon: Secondary | ICD-10-CM

## 2024-06-29 DIAGNOSIS — K5730 Diverticulosis of large intestine without perforation or abscess without bleeding: Secondary | ICD-10-CM

## 2024-06-29 MED ORDER — SODIUM CHLORIDE 0.9 % IV SOLN: 500.0000 mL | Freq: Once | INTRAVENOUS | Status: AC

## 2024-06-29 NOTE — Patient Instructions (Signed)
 YOU HAD AN ENDOSCOPIC PROCEDURE TODAY AT THE [facility]:   Refer to the procedure report that was given to you for any specific questions about what was found during the examination.  If the procedure report does not answer your questions, please call your gastroenterologist to clarify.  If you requested that your care partner not be given the details of your procedure findings, then the procedure report has been included in a sealed envelope for you to review at your convenience later.  YOU SHOULD EXPECT: Some feelings of bloating in the abdomen. Passage of more gas than usual.  Walking can help get rid of the air that was put into your GI tract during the procedure and reduce the bloating. If you had a lower endoscopy (such as a colonoscopy or flexible sigmoidoscopy) you may notice spotting of blood in your stool or on the toilet paper. If you underwent a bowel prep for your procedure, you may not have a normal bowel movement for a few days.  Please Note:  You might notice some irritation and congestion in your nose or some drainage.  This is from the oxygen used during your procedure.  There is no need for concern and it should clear up in a day or so.  SYMPTOMS TO REPORT IMMEDIATELY:  Following lower endoscopy (colonoscopy or flexible sigmoidoscopy):  Excessive amounts of blood in the stool  Significant tenderness or worsening of abdominal pains  Swelling of the abdomen that is new, acute  Fever of 100F or higher  For urgent or emergent issues, a gastroenterologist can be reached at any hour by calling [phone]. Do not use MyChart messaging for urgent concerns.    DIET:  We do recommend a small meal at first, but then you may proceed to your regular diet.  Drink plenty of fluids but you should avoid alcoholic beverages for 24 hours.  MEDICATIONS: Continue present medications. No repeat screening/surveillance colonoscopy due to age, current guidelines and no polyps  findings today.  Educational handouts given to patient: Diverticulosis, Hemorrhoids.  Thank you for allowing us  to provide for your healthcare needs today.  ACTIVITY:  You should plan to take it easy for the rest of today and you should NOT DRIVE or use heavy machinery until tomorrow (because of the sedation medicines used during the test).    FOLLOW UP: Our staff will call the number listed on your records the next business day following your procedure.  We will call around 7:15- 8:00 am to check on you and address any questions or concerns that you may have regarding the information given to you following your procedure. If we do not reach you, we will leave a message.     If any biopsies were taken you will be contacted by phone or by letter within the next 1-3 weeks.  Please call us  at [phone] if you have not heard about the biopsies in 3 weeks.    SIGNATURES/CONFIDENTIALITY: You and/or your care partner have signed paperwork which will be entered into your electronic medical record.  These signatures attest to the fact that that the information above on your After Visit Summary has been reviewed and is understood.  Full responsibility of the confidentiality of this discharge information lies with you and/or your care-partner.

## 2024-06-29 NOTE — Progress Notes (Signed)
 Pt A/O x 3, gd SR's, pleased with anesthesia, report to RN

## 2024-06-29 NOTE — Progress Notes (Signed)
 Pt's states no medical or surgical changes since previsit or office visit.

## 2024-06-29 NOTE — Op Note (Signed)
 [facility] Patient Name: Tina [name] Procedure Date: 06/29/2024 2:51 PM MRN: [id number] Endoscopist: Victory L. Legrand , MD, [id number] Age: 79 Referring MD:  Date of Birth: May 09, 1946 Gender: Female Account #: [id number] Procedure:                Colonoscopy Indications:              Personal history of colonic polyps                           Large rectal villous adenoma requiring surgery Feb                            2021                           Dec 2022 5mm asc colon TA and normal anastomosis Medicines:                Monitored Anesthesia Care Procedure:                Pre-Anesthesia Assessment:                           - Prior to the procedure, a History and Physical                            was performed, and patient medications and                            allergies were reviewed. The patient's tolerance of                            previous anesthesia was also reviewed. The risks                            and benefits of the procedure and the sedation                            options and risks were discussed with the patient.                            All questions were answered, and informed consent                            was obtained. Prior Anticoagulants: The patient has                            taken no anticoagulant or antiplatelet agents. ASA                            Grade Assessment: III - A patient with severe                            systemic disease. After reviewing the risks and  benefits, the patient was deemed in satisfactory                            condition to undergo the procedure.                           After obtaining informed consent, the colonoscope                            was passed under direct vision. Throughout the                            procedure, the patient's blood pressure, pulse, and                            oxygen saturations were monitored continuously. The                             CF [id number] #[id number] was introduced through the anus                            and advanced to the the cecum, identified by                            appendiceal orifice and ileocecal valve. The                            colonoscopy was performed without difficulty. The                            patient tolerated the procedure well. The quality                            of the bowel preparation was good. The ileocecal                            valve, appendiceal orifice, and rectum were                            photographed. Scope In: 3:18:48 PM Scope Out: 3:33:28 PM Scope Withdrawal Time: 0 hours 11 minutes 25 seconds  Total Procedure Duration: 0 hours 14 minutes 40 seconds  Findings:                 The perianal and digital rectal examinations were                            normal.                           Repeat examination of right colon under NBI                            performed.  Diverticula were found in the left colon.                           A tattoo was seen in the mid rectum.                           There was evidence of a prior end-to-end                            colo-rectal anastomosis in the mid rectum. This was                            patent and was characterized by healthy appearing                            mucosa. Easily traversed                           Internal hemorrhoids were found. The hemorrhoids                            were small.                           The exam was otherwise without abnormality on                            direct and retroflexion views. Complications:            No immediate complications. Estimated Blood Loss:     Estimated blood loss: none. Impression:               - Diverticulosis in the left colon.                           - A tattoo was seen in the mid rectum.                           - Patent end-to-end colo-rectal anastomosis,                            characterized by  healthy appearing mucosa.                           - Internal hemorrhoids.                           - The examination was otherwise normal on direct                            and retroflexion views.                           - No specimens collected. Recommendation:           - Patient has a contact number available for  emergencies. The signs and symptoms of potential                            delayed complications were discussed with the                            patient. Return to normal activities tomorrow.                            Written discharge instructions were provided to the                            patient.                           - Resume previous diet.                           - Continue present medications.                           - No repeat screening/surveillance colonoscopy due                            to age, current guidelines and no polyps findings                            today. Myleah Cavendish L. Legrand, MD 06/29/2024 3:39:15 PM This report has been signed electronically.

## 2024-06-30 ENCOUNTER — Telehealth: Payer: Self-pay | Admitting: *Deleted

## 2024-06-30 NOTE — Telephone Encounter (Signed)
" °  Follow up Call-     06/29/2024    2:57 PM  Call back number  Post procedure Call Back phone  # [phone]  Permission to leave phone message Yes     Patient questions:  Do you have a fever, pain , or abdominal swelling? No. Pain Score  0 *  Have you tolerated food without any problems? Yes.    Have you been able to return to your normal activities? Yes.    Do you have any questions about your discharge instructions: Diet   No. Medications  No. Follow up visit  No.  Do you have questions or concerns about your Care? No.  Actions: * If pain score is 4 or above: No action needed, pain <4.   "

## 2024-09-28 ENCOUNTER — Ambulatory Visit: Admitting: Family Medicine

## 2024-10-10 ENCOUNTER — Inpatient Hospital Stay: Attending: Hematology

## 2024-10-10 ENCOUNTER — Inpatient Hospital Stay: Admitting: Hematology

## 2025-04-03 ENCOUNTER — Ambulatory Visit
